# Patient Record
Sex: Female | Born: 1935 | Race: White | Hispanic: No | Marital: Married | Smoking: Never smoker
Health system: Southern US, Community
[De-identification: ages and names within clinical notes are randomized; demographics above are authoritative.]

## PROBLEM LIST (undated history)

## (undated) DIAGNOSIS — M8580 Other specified disorders of bone density and structure, unspecified site: Secondary | ICD-10-CM

## (undated) DIAGNOSIS — N179 Acute kidney failure, unspecified: Secondary | ICD-10-CM

## (undated) DIAGNOSIS — F419 Anxiety disorder, unspecified: Secondary | ICD-10-CM

## (undated) DIAGNOSIS — I4891 Unspecified atrial fibrillation: Secondary | ICD-10-CM

## (undated) DIAGNOSIS — J90 Pleural effusion, not elsewhere classified: Secondary | ICD-10-CM

## (undated) DIAGNOSIS — R590 Localized enlarged lymph nodes: Secondary | ICD-10-CM

## (undated) DIAGNOSIS — I10 Essential (primary) hypertension: Secondary | ICD-10-CM

## (undated) DIAGNOSIS — M899 Disorder of bone, unspecified: Secondary | ICD-10-CM

## (undated) DIAGNOSIS — M1990 Unspecified osteoarthritis, unspecified site: Secondary | ICD-10-CM

## (undated) DIAGNOSIS — R300 Dysuria: Secondary | ICD-10-CM

## (undated) DIAGNOSIS — C9000 Multiple myeloma not having achieved remission: Secondary | ICD-10-CM

## (undated) DIAGNOSIS — Z9221 Personal history of antineoplastic chemotherapy: Secondary | ICD-10-CM

## (undated) DIAGNOSIS — IMO0002 Reserved for concepts with insufficient information to code with codable children: Secondary | ICD-10-CM

## (undated) DIAGNOSIS — K1231 Oral mucositis (ulcerative) due to antineoplastic therapy: Secondary | ICD-10-CM

## (undated) DIAGNOSIS — M87180 Osteonecrosis due to drugs, jaw: Secondary | ICD-10-CM

## (undated) DIAGNOSIS — F32A Depression, unspecified: Secondary | ICD-10-CM

## (undated) DIAGNOSIS — E785 Hyperlipidemia, unspecified: Secondary | ICD-10-CM

## (undated) DIAGNOSIS — D649 Anemia, unspecified: Secondary | ICD-10-CM

## (undated) DIAGNOSIS — M898X9 Other specified disorders of bone, unspecified site: Secondary | ICD-10-CM

## (undated) DIAGNOSIS — C801 Malignant (primary) neoplasm, unspecified: Secondary | ICD-10-CM

## (undated) DIAGNOSIS — F329 Major depressive disorder, single episode, unspecified: Secondary | ICD-10-CM

## (undated) DIAGNOSIS — S065X9A Traumatic subdural hemorrhage with loss of consciousness of unspecified duration, initial encounter: Secondary | ICD-10-CM

## (undated) DIAGNOSIS — IMO0001 Reserved for inherently not codable concepts without codable children: Secondary | ICD-10-CM

## (undated) HISTORY — DX: Traumatic subdural hemorrhage with loss of consciousness of unspecified duration, initial encounter: S06.5X9A

## (undated) HISTORY — PX: TONSILLECTOMY: SUR1361

## (undated) HISTORY — PX: PORT A CATH REVISION: SHX6033

## (undated) HISTORY — DX: Disorder of bone, unspecified: M89.9

## (undated) HISTORY — DX: Unspecified atrial fibrillation: I48.91

## (undated) HISTORY — DX: Anxiety disorder, unspecified: F41.9

## (undated) HISTORY — DX: Osteonecrosis due to drugs, jaw: M87.180

## (undated) HISTORY — DX: Other specified disorders of bone, unspecified site: M89.8X9

## (undated) HISTORY — DX: Anemia, unspecified: D64.9

## (undated) HISTORY — DX: Personal history of antineoplastic chemotherapy: Z92.21

## (undated) HISTORY — DX: Oral mucositis (ulcerative) due to antineoplastic therapy: K12.31

## (undated) HISTORY — DX: Essential (primary) hypertension: I10

## (undated) HISTORY — DX: Reserved for concepts with insufficient information to code with codable children: IMO0002

## (undated) HISTORY — DX: Acute kidney failure, unspecified: N17.9

## (undated) HISTORY — DX: Reserved for inherently not codable concepts without codable children: IMO0001

## (undated) HISTORY — DX: Localized enlarged lymph nodes: R59.0

## (undated) HISTORY — DX: Dysuria: R30.0

## (undated) HISTORY — DX: Hyperlipidemia, unspecified: E78.5

## (undated) HISTORY — DX: Pleural effusion, not elsewhere classified: J90

## (undated) HISTORY — PX: BONE MARROW BIOPSY: SHX199

---

## 1960-09-28 HISTORY — PX: BREAST LUMPECTOMY: SHX2

## 1999-11-27 ENCOUNTER — Other Ambulatory Visit: Admission: RE | Admit: 1999-11-27 | Discharge: 1999-11-27 | Payer: Self-pay | Admitting: Internal Medicine

## 1999-12-09 ENCOUNTER — Ambulatory Visit (HOSPITAL_COMMUNITY): Admission: RE | Admit: 1999-12-09 | Discharge: 1999-12-09 | Payer: Self-pay | Admitting: Internal Medicine

## 1999-12-09 ENCOUNTER — Encounter: Payer: Self-pay | Admitting: Internal Medicine

## 2001-01-07 ENCOUNTER — Ambulatory Visit (HOSPITAL_COMMUNITY): Admission: RE | Admit: 2001-01-07 | Discharge: 2001-01-07 | Payer: Self-pay | Admitting: Family Medicine

## 2001-01-07 ENCOUNTER — Encounter: Payer: Self-pay | Admitting: Family Medicine

## 2002-02-03 ENCOUNTER — Ambulatory Visit (HOSPITAL_COMMUNITY): Admission: RE | Admit: 2002-02-03 | Discharge: 2002-02-03 | Payer: Self-pay | Admitting: Internal Medicine

## 2002-02-03 ENCOUNTER — Encounter: Payer: Self-pay | Admitting: Internal Medicine

## 2003-01-29 ENCOUNTER — Observation Stay (HOSPITAL_COMMUNITY): Admission: EM | Admit: 2003-01-29 | Discharge: 2003-01-30 | Payer: Self-pay | Admitting: Emergency Medicine

## 2003-03-02 ENCOUNTER — Ambulatory Visit (HOSPITAL_COMMUNITY): Admission: RE | Admit: 2003-03-02 | Discharge: 2003-03-02 | Payer: Self-pay | Admitting: Internal Medicine

## 2003-03-02 ENCOUNTER — Encounter: Payer: Self-pay | Admitting: Internal Medicine

## 2004-03-10 ENCOUNTER — Ambulatory Visit (HOSPITAL_COMMUNITY): Admission: RE | Admit: 2004-03-10 | Discharge: 2004-03-10 | Payer: Self-pay | Admitting: Internal Medicine

## 2004-08-16 ENCOUNTER — Emergency Department (HOSPITAL_COMMUNITY): Admission: EM | Admit: 2004-08-16 | Discharge: 2004-08-16 | Payer: Self-pay | Admitting: *Deleted

## 2005-01-06 ENCOUNTER — Encounter: Admission: RE | Admit: 2005-01-06 | Discharge: 2005-01-06 | Payer: Self-pay | Admitting: Internal Medicine

## 2005-03-18 ENCOUNTER — Ambulatory Visit (HOSPITAL_COMMUNITY): Admission: RE | Admit: 2005-03-18 | Discharge: 2005-03-18 | Payer: Self-pay | Admitting: Internal Medicine

## 2006-01-08 ENCOUNTER — Emergency Department (HOSPITAL_COMMUNITY): Admission: EM | Admit: 2006-01-08 | Discharge: 2006-01-08 | Payer: Self-pay | Admitting: Emergency Medicine

## 2006-03-30 ENCOUNTER — Ambulatory Visit: Payer: Self-pay | Admitting: Family Medicine

## 2006-04-01 ENCOUNTER — Ambulatory Visit (HOSPITAL_COMMUNITY): Admission: RE | Admit: 2006-04-01 | Discharge: 2006-04-01 | Payer: Self-pay | Admitting: Family Medicine

## 2006-04-09 ENCOUNTER — Encounter: Admission: RE | Admit: 2006-04-09 | Discharge: 2006-04-09 | Payer: Self-pay | Admitting: Family Medicine

## 2006-06-30 ENCOUNTER — Ambulatory Visit: Payer: Self-pay | Admitting: Family Medicine

## 2006-08-30 ENCOUNTER — Ambulatory Visit: Payer: Self-pay | Admitting: Family Medicine

## 2006-09-30 ENCOUNTER — Ambulatory Visit: Payer: Self-pay | Admitting: Family Medicine

## 2006-09-30 LAB — CONVERTED CEMR LAB
ALT: 21 units/L (ref 0–40)
AST: 19 units/L (ref 0–37)
CO2: 31 meq/L (ref 19–32)
Calcium: 9.3 mg/dL (ref 8.4–10.5)
GFR calc non Af Amer: 76 mL/min
HDL: 48.6 mg/dL (ref >39.0)
LDL Cholesterol: 72 mg/dL (ref 0–99)
Potassium: 4.1 meq/L (ref 3.5–5.1)
Sodium: 138 meq/L (ref 135–145)
Triglyceride fasting, serum: 153 mg/dL — ABNORMAL HIGH (ref 0–149)
VLDL: 31 mg/dL (ref 0–40)

## 2006-10-01 ENCOUNTER — Encounter: Admission: RE | Admit: 2006-10-01 | Discharge: 2006-10-01 | Payer: Self-pay | Admitting: Family Medicine

## 2006-10-19 DIAGNOSIS — G4700 Insomnia, unspecified: Secondary | ICD-10-CM

## 2006-10-19 DIAGNOSIS — F411 Generalized anxiety disorder: Secondary | ICD-10-CM | POA: Insufficient documentation

## 2006-10-19 DIAGNOSIS — R7989 Other specified abnormal findings of blood chemistry: Secondary | ICD-10-CM | POA: Insufficient documentation

## 2006-10-19 DIAGNOSIS — E785 Hyperlipidemia, unspecified: Secondary | ICD-10-CM

## 2006-10-19 DIAGNOSIS — I10 Essential (primary) hypertension: Secondary | ICD-10-CM | POA: Insufficient documentation

## 2006-10-24 DIAGNOSIS — N63 Unspecified lump in unspecified breast: Secondary | ICD-10-CM | POA: Insufficient documentation

## 2007-01-06 ENCOUNTER — Ambulatory Visit: Payer: Self-pay | Admitting: Family Medicine

## 2007-01-06 LAB — CONVERTED CEMR LAB
BUN: 17 mg/dL (ref 6–23)
Chloride: 105 meq/L (ref 96–112)
GFR calc Af Amer: 91 mL/min
Glucose, Bld: 118 mg/dL — ABNORMAL HIGH (ref 70–99)
Potassium: 3.4 meq/L — ABNORMAL LOW (ref 3.5–5.1)

## 2007-01-17 ENCOUNTER — Ambulatory Visit: Payer: Self-pay | Admitting: Family Medicine

## 2007-01-17 LAB — CONVERTED CEMR LAB: Potassium: 4.3 meq/L (ref 3.5–5.1)

## 2007-04-04 ENCOUNTER — Encounter: Admission: RE | Admit: 2007-04-04 | Discharge: 2007-04-04 | Payer: Self-pay | Admitting: Family Medicine

## 2007-04-07 ENCOUNTER — Ambulatory Visit: Payer: Self-pay | Admitting: Family Medicine

## 2007-04-08 ENCOUNTER — Telehealth (INDEPENDENT_AMBULATORY_CARE_PROVIDER_SITE_OTHER): Payer: Self-pay | Admitting: *Deleted

## 2007-04-11 ENCOUNTER — Ambulatory Visit: Payer: Self-pay | Admitting: Family Medicine

## 2007-04-14 ENCOUNTER — Encounter (INDEPENDENT_AMBULATORY_CARE_PROVIDER_SITE_OTHER): Payer: Self-pay | Admitting: Family Medicine

## 2007-04-15 ENCOUNTER — Ambulatory Visit: Payer: Self-pay | Admitting: Cardiovascular Disease

## 2007-04-15 ENCOUNTER — Encounter (INDEPENDENT_AMBULATORY_CARE_PROVIDER_SITE_OTHER): Payer: Self-pay | Admitting: Family Medicine

## 2007-04-20 ENCOUNTER — Encounter (INDEPENDENT_AMBULATORY_CARE_PROVIDER_SITE_OTHER): Payer: Self-pay | Admitting: Family Medicine

## 2007-04-20 ENCOUNTER — Ambulatory Visit: Payer: Self-pay

## 2007-05-24 ENCOUNTER — Telehealth (INDEPENDENT_AMBULATORY_CARE_PROVIDER_SITE_OTHER): Payer: Self-pay | Admitting: *Deleted

## 2007-08-10 ENCOUNTER — Ambulatory Visit: Payer: Self-pay | Admitting: Family Medicine

## 2007-08-11 ENCOUNTER — Telehealth (INDEPENDENT_AMBULATORY_CARE_PROVIDER_SITE_OTHER): Payer: Self-pay | Admitting: *Deleted

## 2007-08-11 LAB — CONVERTED CEMR LAB
AST: 17 units/L (ref 0–37)
BUN: 15 mg/dL (ref 6–23)
GFR calc Af Amer: 91 mL/min
Glucose, Bld: 109 mg/dL — ABNORMAL HIGH (ref 70–99)

## 2007-08-22 ENCOUNTER — Ambulatory Visit: Payer: Self-pay | Admitting: Family Medicine

## 2007-08-24 ENCOUNTER — Encounter (INDEPENDENT_AMBULATORY_CARE_PROVIDER_SITE_OTHER): Payer: Self-pay | Admitting: *Deleted

## 2007-10-17 ENCOUNTER — Ambulatory Visit: Payer: Self-pay | Admitting: Family Medicine

## 2007-11-16 ENCOUNTER — Encounter (INDEPENDENT_AMBULATORY_CARE_PROVIDER_SITE_OTHER): Payer: Self-pay | Admitting: *Deleted

## 2007-11-16 ENCOUNTER — Encounter: Admission: RE | Admit: 2007-11-16 | Discharge: 2007-11-16 | Payer: Self-pay | Admitting: Family Medicine

## 2007-11-29 ENCOUNTER — Telehealth (INDEPENDENT_AMBULATORY_CARE_PROVIDER_SITE_OTHER): Payer: Self-pay | Admitting: *Deleted

## 2007-11-30 ENCOUNTER — Ambulatory Visit: Payer: Self-pay | Admitting: Family Medicine

## 2007-12-01 LAB — CONVERTED CEMR LAB
Cholesterol: 251 mg/dL (ref 0–200)
Creatinine, Ser: 0.8 mg/dL (ref 0.4–1.2)
GFR calc Af Amer: 91 mL/min
GFR calc non Af Amer: 75 mL/min
Glucose, Bld: 114 mg/dL — ABNORMAL HIGH (ref 70–99)
HDL: 48.3 mg/dL (ref >39.0)
Sodium: 136 meq/L (ref 135–145)
Total CHOL/HDL Ratio: 5.2
Triglycerides: 249 mg/dL (ref 0–149)

## 2007-12-02 ENCOUNTER — Telehealth (INDEPENDENT_AMBULATORY_CARE_PROVIDER_SITE_OTHER): Payer: Self-pay | Admitting: *Deleted

## 2007-12-07 ENCOUNTER — Ambulatory Visit: Payer: Self-pay | Admitting: Family Medicine

## 2007-12-07 ENCOUNTER — Encounter (INDEPENDENT_AMBULATORY_CARE_PROVIDER_SITE_OTHER): Payer: Self-pay | Admitting: *Deleted

## 2008-01-03 ENCOUNTER — Telehealth (INDEPENDENT_AMBULATORY_CARE_PROVIDER_SITE_OTHER): Payer: Self-pay | Admitting: *Deleted

## 2008-03-13 ENCOUNTER — Telehealth (INDEPENDENT_AMBULATORY_CARE_PROVIDER_SITE_OTHER): Payer: Self-pay | Admitting: *Deleted

## 2008-04-05 ENCOUNTER — Telehealth (INDEPENDENT_AMBULATORY_CARE_PROVIDER_SITE_OTHER): Payer: Self-pay | Admitting: *Deleted

## 2008-04-09 ENCOUNTER — Ambulatory Visit: Payer: Self-pay | Admitting: Family Medicine

## 2008-04-10 ENCOUNTER — Encounter: Payer: Self-pay | Admitting: Family Medicine

## 2008-04-11 LAB — CONVERTED CEMR LAB
ALT: 14 units/L (ref 0–35)
AST: 15 units/L (ref 0–37)
Bilirubin, Direct: 0.1 mg/dL (ref 0.0–0.3)
HDL: 48 mg/dL (ref >39)
LDL Cholesterol: 74 mg/dL (ref 0–99)
Total Bilirubin: 0.2 mg/dL — ABNORMAL LOW (ref 0.3–1.2)
Total CHOL/HDL Ratio: 3.5
Total Protein: 7.2 g/dL (ref 6.0–8.3)

## 2008-04-17 ENCOUNTER — Ambulatory Visit: Payer: Self-pay | Admitting: Family Medicine

## 2008-04-17 DIAGNOSIS — F329 Major depressive disorder, single episode, unspecified: Secondary | ICD-10-CM

## 2008-05-08 ENCOUNTER — Ambulatory Visit: Payer: Self-pay | Admitting: Family Medicine

## 2008-05-22 ENCOUNTER — Encounter: Admission: RE | Admit: 2008-05-22 | Discharge: 2008-05-22 | Payer: Self-pay | Admitting: Family Medicine

## 2008-05-23 ENCOUNTER — Ambulatory Visit: Payer: Self-pay | Admitting: Family Medicine

## 2008-06-11 ENCOUNTER — Telehealth (INDEPENDENT_AMBULATORY_CARE_PROVIDER_SITE_OTHER): Payer: Self-pay | Admitting: *Deleted

## 2008-06-25 ENCOUNTER — Telehealth: Payer: Self-pay | Admitting: Family Medicine

## 2008-07-04 ENCOUNTER — Ambulatory Visit: Payer: Self-pay | Admitting: Family Medicine

## 2008-08-03 ENCOUNTER — Ambulatory Visit: Payer: Self-pay | Admitting: Family Medicine

## 2008-08-06 LAB — CONVERTED CEMR LAB
AST: 15 units/L (ref 0–37)
Chloride: 100 meq/L (ref 96–112)
Potassium: 4.5 meq/L (ref 3.5–5.3)
Sodium: 139 meq/L (ref 135–145)
Total Bilirubin: 0.5 mg/dL (ref 0.3–1.2)

## 2008-09-03 ENCOUNTER — Ambulatory Visit: Payer: Self-pay | Admitting: Family Medicine

## 2008-09-25 ENCOUNTER — Ambulatory Visit: Payer: Self-pay | Admitting: Family Medicine

## 2008-10-04 ENCOUNTER — Ambulatory Visit: Payer: Self-pay | Admitting: Family Medicine

## 2008-10-05 ENCOUNTER — Ambulatory Visit (HOSPITAL_COMMUNITY): Admission: RE | Admit: 2008-10-05 | Discharge: 2008-10-05 | Payer: Self-pay | Admitting: Family Medicine

## 2008-11-05 ENCOUNTER — Encounter: Admission: RE | Admit: 2008-11-05 | Discharge: 2008-11-05 | Payer: Self-pay | Admitting: Family Medicine

## 2008-11-05 ENCOUNTER — Ambulatory Visit: Payer: Self-pay | Admitting: Family Medicine

## 2008-11-05 DIAGNOSIS — M899 Disorder of bone, unspecified: Secondary | ICD-10-CM | POA: Insufficient documentation

## 2008-11-05 DIAGNOSIS — M949 Disorder of cartilage, unspecified: Secondary | ICD-10-CM

## 2008-11-05 DIAGNOSIS — Z780 Asymptomatic menopausal state: Secondary | ICD-10-CM | POA: Insufficient documentation

## 2008-11-05 LAB — CONVERTED CEMR LAB
Cholesterol, target level: 200 mg/dL
HDL goal, serum: 40 mg/dL
LDL Goal: 130 mg/dL

## 2008-11-27 ENCOUNTER — Telehealth: Payer: Self-pay | Admitting: Family Medicine

## 2008-12-17 ENCOUNTER — Telehealth: Payer: Self-pay | Admitting: Family Medicine

## 2008-12-26 ENCOUNTER — Encounter: Payer: Self-pay | Admitting: Family Medicine

## 2009-04-24 ENCOUNTER — Ambulatory Visit: Payer: Self-pay | Admitting: Family Medicine

## 2009-04-24 LAB — CONVERTED CEMR LAB
Bilirubin Urine: NEGATIVE
Glucose, Urine, Semiquant: NEGATIVE
Ketones, urine, test strip: NEGATIVE
Nitrite: NEGATIVE
Specific Gravity, Urine: 1.02

## 2009-05-27 ENCOUNTER — Encounter: Admission: RE | Admit: 2009-05-27 | Discharge: 2009-05-27 | Payer: Self-pay | Admitting: Gynecology

## 2009-06-06 ENCOUNTER — Encounter: Payer: Self-pay | Admitting: Family Medicine

## 2009-06-09 LAB — CONVERTED CEMR LAB
AST: 15 units/L (ref 0–37)
BUN: 13 mg/dL (ref 6–23)
CO2: 26 meq/L (ref 19–32)
Calcium: 10.2 mg/dL (ref 8.4–10.5)
Cholesterol: 159 mg/dL (ref 0–200)
Glucose, Bld: 100 mg/dL — ABNORMAL HIGH (ref 70–99)
Total Protein: 7.4 g/dL (ref 6.0–8.3)
VLDL: 43 mg/dL — ABNORMAL HIGH (ref 0–40)

## 2009-08-23 ENCOUNTER — Ambulatory Visit: Payer: Self-pay | Admitting: Family Medicine

## 2009-08-23 LAB — CONVERTED CEMR LAB
Casts: 0 /lpf
Glucose, Urine, Semiquant: NEGATIVE
Ketones, urine, test strip: NEGATIVE
RBC / HPF: 0
WBC Urine, dipstick: NEGATIVE
WBC, UA: 0 cells/hpf
Yeast, UA: 0
pH: 5

## 2009-09-30 ENCOUNTER — Emergency Department (HOSPITAL_COMMUNITY)
Admission: EM | Admit: 2009-09-30 | Discharge: 2009-09-30 | Payer: Self-pay | Source: Home / Self Care | Admitting: Emergency Medicine

## 2009-10-03 ENCOUNTER — Ambulatory Visit: Payer: Self-pay | Admitting: Family Medicine

## 2009-11-05 ENCOUNTER — Ambulatory Visit: Payer: Self-pay | Admitting: Family Medicine

## 2009-11-05 DIAGNOSIS — M545 Low back pain: Secondary | ICD-10-CM | POA: Insufficient documentation

## 2009-11-25 ENCOUNTER — Ambulatory Visit: Payer: Self-pay | Admitting: Family Medicine

## 2009-11-25 LAB — CONVERTED CEMR LAB
BUN: 17 mg/dL (ref 6–23)
CO2: 24 meq/L (ref 19–32)
Calcium: 9.7 mg/dL (ref 8.4–10.5)
Creatinine, Ser: 0.65 mg/dL (ref 0.40–1.20)
Potassium: 4.4 meq/L (ref 3.5–5.3)
Sodium: 141 meq/L (ref 135–145)
Triglycerides: 132 mg/dL (ref <150)

## 2009-12-17 ENCOUNTER — Telehealth: Payer: Self-pay | Admitting: Family Medicine

## 2010-01-13 ENCOUNTER — Ambulatory Visit: Payer: Self-pay | Admitting: Family Medicine

## 2010-04-16 ENCOUNTER — Ambulatory Visit: Payer: Self-pay | Admitting: Family Medicine

## 2010-05-28 ENCOUNTER — Encounter: Admission: RE | Admit: 2010-05-28 | Discharge: 2010-05-28 | Payer: Self-pay | Admitting: Family Medicine

## 2010-09-04 ENCOUNTER — Encounter: Payer: Self-pay | Admitting: Family Medicine

## 2010-09-04 ENCOUNTER — Ambulatory Visit: Payer: Self-pay | Admitting: Family Medicine

## 2010-09-05 LAB — CONVERTED CEMR LAB
AST: 17 units/L (ref 0–37)
Albumin: 4.1 g/dL (ref 3.5–5.2)
Alkaline Phosphatase: 66 units/L (ref 39–117)
Basophils Relative: 1 % (ref 0–1)
Calcium: 9.9 mg/dL (ref 8.4–10.5)
Cholesterol: 249 mg/dL — ABNORMAL HIGH (ref 0–200)
Folate: 10.5 ng/mL
Glucose, Bld: 104 mg/dL — ABNORMAL HIGH (ref 70–99)
HCT: 39.2 % (ref 36.0–46.0)
HDL: 42 mg/dL (ref >39)
Hemoglobin: 12.8 g/dL (ref 12.0–15.0)
Neutro Abs: 2.9 10*3/uL (ref 1.7–7.7)
RBC: 4.18 M/uL (ref 3.87–5.11)
TSH: 1.321 microintl units/mL (ref 0.350–4.500)
Total CHOL/HDL Ratio: 5.9
Triglycerides: 273 mg/dL — ABNORMAL HIGH (ref <150)
VLDL: 55 mg/dL — ABNORMAL HIGH (ref 0–40)

## 2010-09-08 ENCOUNTER — Encounter: Payer: Self-pay | Admitting: Family Medicine

## 2010-09-09 LAB — CONVERTED CEMR LAB: Hgb A1c MFr Bld: 5.6 % (ref <5.7)

## 2010-10-26 LAB — CONVERTED CEMR LAB
Creatinine, Ser: 0.7 mg/dL (ref 0.4–1.2)
GFR calc non Af Amer: 88 mL/min
Glucose, Bld: 96 mg/dL (ref 70–99)
Sodium: 138 meq/L (ref 135–145)

## 2010-10-28 NOTE — Assessment & Plan Note (Signed)
Summary: FU gastroenteritis, HTN, etc   Vital Signs:  Patient profile:   75 year old female Height:      66 inches Weight:      173 pounds Temp:     97.7 degrees F oral Pulse rate:   79 / minute BP sitting:   181 / 110  (left arm) Cuff size:   regular  Vitals Entered By: Kathlene November (October 03, 2009 2:51 PM) CC: sunday had diarrhea and vomiting all night- went to ED- given fluids- today has H/A no nausea or diarrhea   Primary Care Provider:  [name] MD  CC:  sunday had diarrhea and vomiting all night- went to ED- given fluids- today has H/A no nausea or diarrhea.  History of Present Illness: sunday had diarrhea and vomiting all night- went to ED- given fluids- today has H/A no nausea or diarrhea.  Says she feels 50 % better.  Did go out to eat that morning at [employer] to the hospital at [facility] Monday morning. GIven IV nad nausea medicine (odansetran). Had some HA and dizziness today.  Husband started vomiting about 30 min before she did.  REviewd hospital ED note.   Current Medications (verified): 1)  Seroquel 50 Mg Tabs (Quetiapine Fumarate) .... Take 1 Tablet By Mouth Once A Day At Bedtime 2)  Toprol Xl 100 Mg  Tb24 (Metoprolol Succinate) .Marland Kitchen.. 1 By Mouth Qd 3)  Butalbital Compound/asa 50-325-40 Mg Caps (Butalbital-Aspirin-Caffeine) .... Take 1 To 2 Capsule By Mouth Up To Once A Day As Needed 4)  Alprazolam 1 Mg  Tabs (Alprazolam) .... Take One To Two  Tablet Daily As Needed 5)  Simvastatin 40 Mg Tabs (Simvastatin) .... Take 1 Tablet By Mouth Once A Day At Bedtime 6)  Benicar Hct 40-25 Mg  Tabs (Olmesartan Medoxomil-Hctz) .... Take 1 Tablet By Mouth Once A Day 7)  Feldene 10 Mg Caps (Piroxicam) .... Take 1 Tablet By Mouth Two Times A Day 8)  Norvasc 10 Mg Tabs (Amlodipine Besylate) .... Take 1 Tablet By Mouth Once A Day 9)  Clonidine Hcl 0.2 Mg Tabs (Clonidine Hcl) .... Take 1 Tablet By Mouth Two Times A Day 10)  Fish Oil 1000 Mg Caps (Omega-3 Fatty Acids) ....  4 Tabs By Mouth Once A Day  Allergies (verified): No Known Drug Allergies  Comments:  Nurse/Medical Assistant: The patient's medications and allergies were reviewed with the patient and were updated in the Medication and Allergy Lists. Kathlene November (October 03, 2009 2:52 PM)  Social History: Reviewed history from 04/09/2008 and no changes required. Retired . HS degree. married to [place] with one adult son.  Never Smoked Alcohol use-no Drug use-no Regular exercise-no  Physical Exam  General:  Well-developed,well-nourished,in no acute distress; alert,appropriate and cooperative throughout examination Head:  Normocephalic and atraumatic without obvious abnormalities. No apparent alopecia or balding. Lungs:  Normal respiratory effort, chest expands symmetrically. Lungs are clear to auscultation, no crackles or wheezes. Heart:  Normal rate and regular rhythm. S1 and S2 normal without gallop, murmur, click, rub or other extra sounds. Abdomen:  Bowel sounds positive,abdomen soft and non-tender without masses, organomegaly or hernias noted. Pulses:  radial 2+  Skin:  no rashes.   Psych:  Cognition and judgment appear intact. Alert and cooperative with normal attention span and concentration. No apparent delusions, illusions, hallucinations   Impression & Recommendations:  Problem # 1:  GASTROENTERITIS (ICD-558.9) Much improved no longer vomiting.  Work on increasing hydration.  Should feel much better over the next couple of days. Can use her nausea med if needed.   Problem # 2:  HYPERTENSION (ICD-401.9) I am very concerned about her BP. SHe is not very concerned. Looking at her med list I think she may not be taking her Torpol or her Benicar. Printed her a list and asked her to check at home to see what she is taking. If we can't get this under control then we will refer to Cardiology for BP managment. Say [facility] a couple of years ago.  Her updated medication list for this  problem includes:    Toprol Xl 100 Mg Tb24 (Metoprolol succinate) .Marland Kitchen... 1 by mouth qd    Benicar Hct 40-25 Mg Tabs (Olmesartan medoxomil-hctz) .Marland Kitchen... Take 1 tablet by mouth once a day    Norvasc 10 Mg Tabs (Amlodipine besylate) .Marland Kitchen... Take 1 tablet by mouth once a day    Clonidine Hcl 0.2 Mg Tabs (Clonidine hcl) .Marland Kitchen... Take 1 tablet by mouth two times a day  Problem # 3:  DEPRESSION (ICD-311) Her copy is going to double for her seroquel and she feels this does help her so  will increase to 100mg  and have her cut them in half for cost savings.   Her updated medication list for this problem includes:    Alprazolam 1 Mg Tabs (Alprazolam) .Marland Kitchen... Take one to two  tablet daily as needed  Complete Medication List: 1)  Seroquel 100 Mg Tabs (Quetiapine fumarate) .... Take 1 tablet by mouth once a day 2)  Toprol Xl 100 Mg Tb24 (Metoprolol succinate) .Marland Kitchen.. 1 by mouth qd 3)  Butalbital Compound/asa 50-325-40 Mg Caps (Butalbital-aspirin-caffeine) .... Take 1 to 2 capsule by mouth up to once a day as needed 4)  Alprazolam 1 Mg Tabs (Alprazolam) .... Take one to two  tablet daily as needed 5)  Simvastatin 40 Mg Tabs (Simvastatin) .... Take 1 tablet by mouth once a day at bedtime 6)  Benicar Hct 40-25 Mg Tabs (Olmesartan medoxomil-hctz) .... Take 1 tablet by mouth once a day 7)  Feldene 10 Mg Caps (Piroxicam) .... Take 1 tablet by mouth two times a day 8)  Norvasc 10 Mg Tabs (Amlodipine besylate) .... Take 1 tablet by mouth once a day 9)  Clonidine Hcl 0.2 Mg Tabs (Clonidine hcl) .... Take 1 tablet by mouth two times a day 10)  Fish Oil 1000 Mg Caps (Omega-3 fatty acids) .... 4 tabs by mouth once a day  Patient Instructions: 1)  Please check the medicine list and see what is missing. I think you are missing the toprol and the Benicar. Call me with what you are missing and then follow up in one month for BP check. It is way too high.  Prescriptions: SEROQUEL 100 MG TABS (QUETIAPINE FUMARATE) Take 1 tablet by  mouth once a day  #30 x 3   Entered and Authorized by:   [name] MD   Signed by:   [name] MD on 10/03/2009   Method used:   Electronically to        [facility] (retail)       [address]       [place], [place]  [address]       Ph: [phone]       Fax: [phone]   RxID:   [id number]   Last Flu Vaccine:  Fluvax 3+ (07/04/2008 9:00:08 AM) Flu Vaccine Result Date:  06/28/2009 Flu Vaccine Result:  given Flu Vaccine Next Due:  1 yr

## 2010-10-28 NOTE — Assessment & Plan Note (Signed)
Summary: back pain   Vital Signs:  Patient profile:   75 year old female Height:      66 inches Weight:      174 pounds BMI:     28.19 O2 Sat:      96 % on Room air Temp:     97.4 degrees F oral Pulse rate:   69 / minute BP sitting:   188 / 108  (right arm)  Vitals Entered By: Payton Spark CMA/april (November 05, 2009 2:23 PM)  O2 Flow:  Room air CC: c/o lower back pain x 4 days, Back Pain   Primary Care Provider:  [name] MD  CC:  c/o lower back pain x 4 days and Back Pain.  History of Present Illness:       This is a 75 year old woman who presents with Back Pain.  The symptoms began 4 days ago.  hurts to turn over.  No hx of back problems.  Tried OTC analgesics but nothing helped.  The patient denies fever, chills, urinary incontinence, urinary retention, and dysuria.  The pain is located in the right low back and left low back.  The pain began at home and gradually.  The pain is made worse by flexion and activity.  The pain is made better by activity.  Risk factors for serious underlying conditions include age >= 50 years.    Current Medications (verified): 1)  Seroquel 100 Mg Tabs (Quetiapine Fumarate) .... Take 1 Tablet By Mouth Once A Day 2)  Toprol Xl 100 Mg  Tb24 (Metoprolol Succinate) .Marland Kitchen.. 1 By Mouth Qd 3)  Butalbital Compound/asa 50-325-40 Mg Caps (Butalbital-Aspirin-Caffeine) .... Take 1 To 2 Capsule By Mouth Up To Once A Day As Needed 4)  Alprazolam 1 Mg  Tabs (Alprazolam) .... Take One To Two  Tablet Daily As Needed 5)  Simvastatin 40 Mg Tabs (Simvastatin) .... Take 1 Tablet By Mouth Once A Day At Bedtime 6)  Benicar Hct 40-25 Mg  Tabs (Olmesartan Medoxomil-Hctz) .... Take 1 Tablet By Mouth Once A Day 7)  Feldene 10 Mg Caps (Piroxicam) .... Take 1 Tablet By Mouth Two Times A Day 8)  Norvasc 10 Mg Tabs (Amlodipine Besylate) .... Take 1 Tablet By Mouth Once A Day 9)  Clonidine Hcl 0.2 Mg Tabs (Clonidine Hcl) .... Take 1 Tablet By Mouth Two Times A Day 10)   Fish Oil 1000 Mg Caps (Omega-3 Fatty Acids) .... 4 Tabs By Mouth Once A Day  Allergies (verified): No Known Drug Allergies  Past History:  Past Medical History: Reviewed history from 10/04/2008 and no changes required. Anxiety Hypertension Stress TESt (? date)  Social History: Reviewed history from 04/09/2008 and no changes required. Retired . HS degree. married to [place] with one adult son.  Never Smoked Alcohol use-no Drug use-no Regular exercise-no  Review of Systems      See HPI       Denies urinary symptoms.  Physical Exam  General:  overwt  WF in NAD Head:  normocephalic and atraumatic.   Neck:  no masses.   Lungs:  Normal respiratory effort, chest expands symmetrically. Lungs are clear to auscultation, no crackles or wheezes. Heart:  Normal rate and regular rhythm. S1 and S2 normal without gallop, murmur, click, rub or other extra sounds. Abdomen:  soft and non-tender.   Msk:  thoracolumbar scoliosis to the R side. nontender over SI notches. Tender at L5-S1 midline.  full hip ROM. tender L spine flexion, to  90 deg, full extension.  gait normal.   Extremities:  no LE edema Neurologic:  strength normal in all extremities, sensation intact to light touch, and gait normal.   Skin:  color normal.   Psych:  good eye contact, not anxious appearing, and not depressed appearing.     Impression & Recommendations:  Problem # 1:  BACK PAIN, LUMBAR (ICD-724.2) Assessment New Treat with Ibuprofen OTC + Vicodin + Flexeril along with gentle stretching, heat and avoidance of vigourous activity x 2 wks.  F/U in 2 wks.  If not improving by 2 wk f/u visit, recommend L spine Xray.  Likely has arthritis in the L spine given age.    Her updated medication list for this problem includes:    Ibuprofen OTC 800 mg q 8 hrs with food prn    Flexeril 5 Mg Tabs (Cyclobenzaprine hcl) .Marland Kitchen... 1 tab by mouth at bedtime as needed back pain    Vicodin 5-500 Mg Tabs  (Hydrocodone-acetaminophen) .Marland Kitchen... 1 tab by mouth three times a day as needed severe back pain, take with food  Problem # 2:  HYPERTENSION (ICD-401.9) BP very high today.  Reminded her of medication compliance.  In moderate pain today. Recheck with Dr Linford Arnold at 2 wk f/u visit. Her updated medication list for this problem includes:    Toprol Xl 100 Mg Tb24 (Metoprolol succinate) .Marland Kitchen... 1 by mouth qd    Benicar Hct 40-25 Mg Tabs (Olmesartan medoxomil-hctz) .Marland Kitchen... Take 1 tablet by mouth once a day    Norvasc 10 Mg Tabs (Amlodipine besylate) .Marland Kitchen... Take 1 tablet by mouth once a day    Clonidine Hcl 0.2 Mg Tabs (Clonidine hcl) .Marland Kitchen... Take 1 tablet by mouth two times a day  BP today: 188/108 Prior BP: 181/110 (10/03/2009)  Prior 10 Yr Risk Heart Disease: 8 % (11/05/2008)  Labs Reviewed: K+: 4.3 (06/06/2009) Creat: : 0.78 (06/06/2009)   Chol: 159 (06/06/2009)   HDL: 44 (06/06/2009)   LDL: 72 (06/06/2009)   TG: 213 (06/06/2009)  Complete Medication List: 1)  Seroquel 100 Mg Tabs (Quetiapine fumarate) .... Take 1 tablet by mouth once a day 2)  Toprol Xl 100 Mg Tb24 (Metoprolol succinate) .Marland Kitchen.. 1 by mouth qd 3)  Butalbital Compound/asa 50-325-40 Mg Caps (Butalbital-aspirin-caffeine) .... Take 1 to 2 capsule by mouth up to once a day as needed 4)  Alprazolam 1 Mg Tabs (Alprazolam) .... Take one to two  tablet daily as needed 5)  Simvastatin 40 Mg Tabs (Simvastatin) .... Take 1 tablet by mouth once a day at bedtime 6)  Benicar Hct 40-25 Mg Tabs (Olmesartan medoxomil-hctz) .... Take 1 tablet by mouth once a day 7)  Norvasc 10 Mg Tabs (Amlodipine besylate) .... Take 1 tablet by mouth once a day 8)  Clonidine Hcl 0.2 Mg Tabs (Clonidine hcl) .... Take 1 tablet by mouth two times a day 9)  Fish Oil 1000 Mg Caps (Omega-3 fatty acids) .... 4 tabs by mouth once a day 10)  Flexeril 5 Mg Tabs (Cyclobenzaprine hcl) .Marland Kitchen.. 1 tab by mouth at bedtime as needed back pain 11)  Vicodin 5-500 Mg Tabs  (Hydrocodone-acetaminophen) .Marland Kitchen.. 1 tab by mouth three times a day as needed severe back pain, take with food  Patient Instructions: 1)  Take Flexeril at night (muscle relaxer ) x 2 wks. 2)  Use Vicodin for severe pain. 3)  OK to take OTC Ibuprofen 800 mg (4 tabs) every 8 hrs with food. 4)  OK to use thermacare heat wraps  and use heating pad for pain. 5)  Gentle stretches. 6)  Avoid vigorous housework. 7)  F/U in 2 wks for HTN/ back pain. Prescriptions: VICODIN 5-500 MG TABS (HYDROCODONE-ACETAMINOPHEN) 1 tab by mouth three times a day as needed severe back pain, take with food  #50 x 0   Entered and Authorized by:   Seymour Bars DO   Signed by:   Seymour Bars DO on 11/05/2009   Method used:   Printed then faxed to ...       [facility] (retail)       [address]       [place], [place]  [address]       Ph: [phone]       Fax: [phone]   RxID:   [id number] FLEXERIL 5 MG TABS (CYCLOBENZAPRINE HCL) 1 tab by mouth at bedtime as needed back pain  #15 x 0   Entered and Authorized by:   Seymour Bars DO   Signed by:   Seymour Bars DO on 11/05/2009   Method used:   Electronically to        [facility] (retail)       [address]       [place], [place]  [address]       Ph: [phone]       Fax: [phone]   RxID:   [id number]

## 2010-10-28 NOTE — Assessment & Plan Note (Signed)
Summary: fup HTN   Vital Signs:  Patient profile:   75 year old female Height:      66 inches Weight:      171 pounds Pulse rate:   80 / minute BP sitting:   177 / 108  (left arm) Cuff size:   large  Vitals Entered By: Kathlene November (November 25, 2009 8:23 AM) CC: followup BP, Hypertension Management   Primary Care Provider:  [name] MD  CC:  followup BP and Hypertension Management.  History of Present Illness: Has lost 3 more pounds.  BP have been runnin 140s/76 and some runninbg alittle higher. Forgot to bring in her list of BPS from home. Ran out of her Toprol about 3 days ago.    Hypertension History:      She denies headache, chest pain, palpitations, dyspnea with exertion, orthopnea, PND, peripheral edema, visual symptoms, neurologic problems, syncope, and side effects from treatment.  She notes no problems with any antihypertensive medication side effects.        Positive major cardiovascular risk factors include female age 32 years old or older, hyperlipidemia, and hypertension.  Negative major cardiovascular risk factors include non-tobacco-user status.     Current Medications (verified): 1)  Seroquel 100 Mg Tabs (Quetiapine Fumarate) .... Take 1 Tablet By Mouth Once A Day 2)  Toprol Xl 100 Mg  Tb24 (Metoprolol Succinate) .Marland Kitchen.. 1 By Mouth Qd 3)  Butalbital Compound/asa 50-325-40 Mg Caps (Butalbital-Aspirin-Caffeine) .... Take 1 To 2 Capsule By Mouth Up To Once A Day As Needed 4)  Alprazolam 1 Mg  Tabs (Alprazolam) .... Take One To Two  Tablet Daily As Needed 5)  Simvastatin 40 Mg Tabs (Simvastatin) .... Take 1 Tablet By Mouth Once A Day At Bedtime 6)  Benicar Hct 40-25 Mg  Tabs (Olmesartan Medoxomil-Hctz) .... Take 1 Tablet By Mouth Once A Day 7)  Norvasc 10 Mg Tabs (Amlodipine Besylate) .... Take 1 Tablet By Mouth Once A Day 8)  Clonidine Hcl 0.2 Mg Tabs (Clonidine Hcl) .... Take 1 Tablet By Mouth Two Times A Day 9)  Fish Oil 1000 Mg Caps (Omega-3 Fatty Acids)  .... 4 Tabs By Mouth Once A Day 10)  Flexeril 5 Mg Tabs (Cyclobenzaprine Hcl) .Marland Kitchen.. 1 Tab By Mouth At Bedtime As Needed Back Pain 11)  Vicodin 5-500 Mg Tabs (Hydrocodone-Acetaminophen) .Marland Kitchen.. 1 Tab By Mouth Three Times A Day As Needed Severe Back Pain, Take With Food  Allergies (verified): No Known Drug Allergies  Comments:  Nurse/Medical Assistant: The patient's medications and allergies were reviewed with the patient and were updated in the Medication and Allergy Lists. Kathlene November (November 25, 2009 8:23 AM)  Physical Exam  General:  Well-developed,well-nourished,in no acute distress; alert,appropriate and cooperative throughout examination Head:  Normocephalic and atraumatic without obvious abnormalities. No apparent alopecia or balding. Eyes:  No corneal or conjunctival inflammation noted. EOMI. Perrla. Lungs:  Normal respiratory effort, chest expands symmetrically. Lungs are clear to auscultation, no crackles or wheezes. Heart:  Normal rate and regular rhythm. S1 and S2 normal without gallop, murmur, click, rub or other extra sounds. Extremities:  No LE edema.  Skin:  no rashes.     Impression & Recommendations:  Problem # 1:  HYPERTENSION (ICD-401.9) F/U in 3 months.  Doing well overall. Consider chaning to Tribenzor to simplify her plan. Will call her phamacist and see if this is a possiblity. Due for labs today. Neesd to continue to work on her weight and diet.  Her updated medication  list for this problem includes:    Toprol Xl 100 Mg Tb24 (Metoprolol succinate) .Marland Kitchen... 1 by mouth qd    Benicar Hct 40-25 Mg Tabs (Olmesartan medoxomil-hctz) .Marland Kitchen... Take 1 tablet by mouth once a day    Norvasc 10 Mg Tabs (Amlodipine besylate) .Marland Kitchen... Take 1 tablet by mouth once a day    Clonidine Hcl 0.2 Mg Tabs (Clonidine hcl) .Marland Kitchen... Take 1 tablet by mouth two times a day  Orders: T-Basic Metabolic Panel [phone])  BP today: 177/108 Prior BP: 188/108 (11/05/2009)  Prior 10 Yr Risk Heart  Disease: 8 % (11/05/2008)  Labs Reviewed: K+: 4.3 (06/06/2009) Creat: : 0.78 (06/06/2009)   Chol: 159 (06/06/2009)   HDL: 44 (06/06/2009)   LDL: 72 (06/06/2009)   TG: 213 (06/06/2009)  Complete Medication List: 1)  Seroquel 100 Mg Tabs (Quetiapine fumarate) .... Take 1 tablet by mouth once a day 2)  Toprol Xl 100 Mg Tb24 (Metoprolol succinate) .Marland Kitchen.. 1 by mouth qd 3)  Butalbital Compound/asa 50-325-40 Mg Caps (Butalbital-aspirin-caffeine) .... Take 1 to 2 capsule by mouth up to once a day as needed 4)  Alprazolam 1 Mg Tabs (Alprazolam) .... Take one to two  tablet daily as needed 5)  Simvastatin 40 Mg Tabs (Simvastatin) .... Take 1 tablet by mouth once a day at bedtime 6)  Benicar Hct 40-25 Mg Tabs (Olmesartan medoxomil-hctz) .... Take 1 tablet by mouth once a day 7)  Norvasc 10 Mg Tabs (Amlodipine besylate) .... Take 1 tablet by mouth once a day 8)  Clonidine Hcl 0.2 Mg Tabs (Clonidine hcl) .... Take 1 tablet by mouth two times a day 9)  Fish Oil 1000 Mg Caps (Omega-3 fatty acids) .... 4 tabs by mouth once a day 10)  Flexeril 5 Mg Tabs (Cyclobenzaprine hcl) .Marland Kitchen.. 1 tab by mouth at bedtime as needed back pain 11)  Vicodin 5-500 Mg Tabs (Hydrocodone-acetaminophen) .Marland Kitchen.. 1 tab by mouth three times a day as needed severe back pain, take with food  Other Orders: T-Triglycerides [phone])  Hypertension Assessment/Plan:      The patient's hypertensive risk group is category B: At least one risk factor (excluding diabetes) with no target organ damage.  Her calculated 10 year risk of coronary heart disease is 15 %.  Today's blood pressure is 177/108.  Her blood pressure goal is < 140/90. Prescriptions: TOPROL XL 100 MG  TB24 (METOPROLOL SUCCINATE) 1 by mouth qd  #30 x 6   Entered and Authorized by:   [name] MD   Signed by:   [name] MD on 11/25/2009   Method used:   Electronically to        [facility] (retail)       [address]       [place], [place]  [address]       Ph:  [phone]       Fax: [phone]   RxID:   [id number]   Appended Document: fup HTN Called roy at [facility]. will have pt pick up tribenzor rx and coupon card and see if can process without insurance and get the $25 price. Calo pt adn let her know can come by and pick up paper rx adn coupon and try to have Channing Mutters at [facility] run it through.  March  2, 20113:52 PM Dyer Klug MD, Santina Evans  11/28/2009 @ 8:11am- Pt notified. KJ LPN  Prescriptions: TRIBENZOR 40-10-25 MG TABS (OLMESARTAN-AMLODIPINE-HCTZ) Take 1 tablet by mouth once a day  #30 x 1   Entered and Authorized by:  [name] MD   Signed by:   [name] MD on 11/28/2009   Method used:   Print then Give to Patient   RxID:   [id number]

## 2010-10-28 NOTE — Progress Notes (Signed)
Summary: BP readings  Phone Note Call from Patient Call back at Home Phone [phone]   Caller: Patient Call For: [name] MD Summary of Call: Pt calls with her BP readings since starting the BP med you put her on  128/68, 130/79, 118/78, 92/60, 122/80, 108/76. Pt states feeling good and has her followup at end of April Initial call taken by: Kathlene November,  December 17, 2009 8:50 AM  Follow-up for Phone Call        Awesome!  Keep f/u appt.  Follow-up by: [name] MD,  December 17, 2009 10:33 AM

## 2010-10-28 NOTE — Assessment & Plan Note (Signed)
Summary: Fatigue   Vital Signs:  Patient profile:   75 year old female Height:      66 inches Weight:      167 pounds Pulse rate:   67 / minute BP sitting:   123 / 84  (right arm) Cuff size:   regular  Vitals Entered By: Avon Gully CMA, Duncan Dull) (September 04, 2010 8:38 AM) CC: fatigue,no energy   Primary Care Provider:  [name] MD  CC:  fatigue and no energy.  History of Present Illness: Has felt run down for about a month. Has been going to [employer] (exercise program) and has lot 5 lbs. No pain, CP, SOB or swelling. Also around that same time she started weaning her seroquel. She is not sure if these two are related. The seroquel is expensive and she says it does help her rest. No other sxs.   Current Medications (verified): 1)  Seroquel 100 Mg Tabs (Quetiapine Fumarate) .... Take 1 Tablet By Mouth Once A Day 2)  Butalbital Compound/asa 50-325-40 Mg Caps (Butalbital-Aspirin-Caffeine) .... Take 1 To 2 Capsule By Mouth Up To Once A Day As Needed 3)  Alprazolam 1 Mg  Tabs (Alprazolam) .... Take One To Two  Tablet Daily As Needed 4)  Fish Oil 1000 Mg Caps (Omega-3 Fatty Acids) .... 4 Tabs By Mouth Once A Day 5)  Tribenzor 40-10-25 Mg Tabs (Olmesartan-Amlodipine-Hctz) .... Take 1 Tablet By Mouth Once A Day  Allergies (verified): No Known Drug Allergies  Comments:  Nurse/Medical Assistant: The patient's medications and allergies were reviewed with the patient and were updated in the Medication and Allergy Lists. Avon Gully CMA, Duncan Dull) (September 04, 2010 8:39 AM)  Family History: Reviewed history from 04/09/2008 and no changes required. Mother with Heart Dx 2 sisters with DM,choleserol, HTN  Social History: Reviewed history from 04/09/2008 and no changes required. Retired . HS degree. married to [place] with one adult son.  Never Smoked Alcohol use-no Drug use-no Regular exercise-no  Physical Exam  General:  Well-developed,well-nourished,in no  acute distress; alert,appropriate and cooperative throughout examination Head:  Normocephalic and atraumatic without obvious abnormalities. No apparent alopecia or balding. Eyes:  No corneal or conjunctival inflammation noted. EOMI. Perrla. Ears:  External ear exam shows no significant lesions or deformities.  Otoscopic examination reveals clear canals, tympanic membranes are intact bilaterally without bulging, retraction, inflammation or discharge. Hearing is grossly normal bilaterally. Nose:  External nasal examination shows no deformity or inflammation. Nasal mucosa are pink and moist without lesions or exudates. Mouth:  Oral mucosa and oropharynx without lesions or exudates.  Teeth in good repair. Neck:  No deformities, masses, or tenderness noted. No TM.  Lungs:  Normal respiratory effort, chest expands symmetrically. Lungs are clear to auscultation, no crackles or wheezes. Heart:  Normal rate and regular rhythm. S1 and S2 normal without gallop, murmur, click, rub or other extra sounds.  Pulses:  Radial 2+  Extremities:  No LE edema.  Skin:  no rashes.   Cervical Nodes:  No lymphadenopathy noted Psych:  Cognition and judgment appear intact. Alert and cooperative with normal attention span and concentration. No apparent delusions, illusions, hallucinations   Impression & Recommendations:  Problem # 1:  FATIGUE (ICD-780.79) Assessment New Congratulated her on her weight loss.  Given a PNA vaccine today.  Also given stool cards  Will get screening labs. Hasn't had labwork in over a year.  Consider this may be from weaning her seroquel since this does help her rest better.  Orders: T-Comprehensive Metabolic Panel [phone]) T-TSH [phone]) T-CBC w/Diff [id number]) T-Lipid Profile [id number]) T-Vitamin B12 [id number]) T-Vitamin D (25-Hydroxy) [id number]) T-Folate (64332)  Complete Medication List: 1)  Seroquel 100 Mg Tabs (Quetiapine fumarate) .... Take 1 tablet by  mouth once a day 2)  Butalbital Compound/asa 50-325-40 Mg Caps (Butalbital-aspirin-caffeine) .... Take 1 to 2 capsule by mouth up to once a day as needed 3)  Alprazolam 1 Mg Tabs (Alprazolam) .... Take one to two  tablet daily as needed 4)  Fish Oil 1000 Mg Caps (Omega-3 fatty acids) .... 4 tabs by mouth once a day 5)  Tribenzor 40-10-25 Mg Tabs (Olmesartan-amlodipine-hctz) .... Take 1 tablet by mouth once a day  Other Orders: Pneumococcal Vaccine (95188) Admin 1st Vaccine (41660)  Patient Instructions: 1)  Follow up in 3 months.  2)  We will call you with your lab results.    Orders Added: 1)  T-Comprehensive Metabolic Panel [[id number]] 2)  T-TSH [[id number]] 3)  T-CBC w/Diff [[id number]] 4)  T-Lipid Profile [[id number]] 5)  T-Vitamin B12 [82607-23330] 6)  T-Vitamin D (25-Hydroxy) [[id number]] 7)  T-Folate [23340] 8)  Pneumococcal Vaccine [90732] 9)  Admin 1st Vaccine [90471] 10)  Est. Patient Level IV [07371]   Immunization History:  Influenza Immunization History:    Influenza:  historical (06/28/2010)  Immunizations Administered:  Pneumonia Vaccine:    Vaccine Type: Pneumovax (Medicare)    Site: left deltoid    Mfr: Merck    Dose: 0.5 ml    Route: IM    Given by: Sue Lush McCrimmon CMA, (AAMA)    Exp. Date: 01/23/2012    Lot #: [id number]    VIS given: 09/02/09 version given September 04, 2010.   Immunization History:  Influenza Immunization History:    Influenza:  Historical (06/28/2010)  Immunizations Administered:  Pneumonia Vaccine:    Vaccine Type: Pneumovax (Medicare)    Site: left deltoid    Mfr: Merck    Dose: 0.5 ml    Route: IM    Given by: Sue Lush McCrimmon CMA, (AAMA)    Exp. Date: 01/23/2012    Lot #: [id number]    VIS given: 09/02/09 version given September 04, 2010.  Bone Density Result Date:  11/05/2008 Bone Density Result:  abnormal Bone Density Next Due: 2 yr    Immunization History:  Influenza Immunization History:    Influenza:   historical (06/28/2010)

## 2010-10-28 NOTE — Assessment & Plan Note (Signed)
Summary: HTN   Vital Signs:  Patient profile:   75 year old female Height:      66 inches Weight:      172 pounds Pulse rate:   87 / minute BP sitting:   120 / 81  (left arm) Cuff size:   large  Vitals Entered By: Kathlene November (January 13, 2010 9:09 AM) CC: followup BP, Hypertension Management   CC:  followup BP and Hypertension Management.  Hypertension History:      She denies headache, chest pain, palpitations, dyspnea with exertion, orthopnea, PND, peripheral edema, visual symptoms, neurologic problems, syncope, and side effects from treatment.  She notes no problems with any antihypertensive medication side effects.  BPs at home have been 92-130/70-85.  Marland Kitchen        Positive major cardiovascular risk factors include female age 88 years old or older, hyperlipidemia, and hypertension.  Negative major cardiovascular risk factors include non-tobacco-user status.     Current Medications (verified): 1)  Seroquel 100 Mg Tabs (Quetiapine Fumarate) .... Take 1 Tablet By Mouth Once A Day 2)  Butalbital Compound/asa 50-325-40 Mg Caps (Butalbital-Aspirin-Caffeine) .... Take 1 To 2 Capsule By Mouth Up To Once A Day As Needed 3)  Alprazolam 1 Mg  Tabs (Alprazolam) .... Take One To Two  Tablet Daily As Needed 4)  Simvastatin 40 Mg Tabs (Simvastatin) .... Take 1 Tablet By Mouth Once A Day At Bedtime 5)  Fish Oil 1000 Mg Caps (Omega-3 Fatty Acids) .... 4 Tabs By Mouth Once A Day 6)  Tribenzor 40-10-25 Mg Tabs (Olmesartan-Amlodipine-Hctz) .... Take 1 Tablet By Mouth Once A Day  Allergies (verified): No Known Drug Allergies  Comments:  Nurse/Medical Assistant: The patient's medications and allergies were reviewed with the patient and were updated in the Medication and Allergy Lists. Kathlene November (January 13, 2010 9:10 AM)  Physical Exam  General:  Well-developed,well-nourished,in no acute distress; alert,appropriate and cooperative throughout examination Head:  Normocephalic and atraumatic without  obvious abnormalities. No apparent alopecia or balding. Eyes:  No corneal or conjunctival inflammation noted. EOMI. Perrla.  Lungs:  Normal respiratory effort, chest expands symmetrically. Lungs are clear to auscultation, no crackles or wheezes. Heart:  Normal rate and regular rhythm. S1 and S2 normal without gallop, murmur, click, rub or other extra sounds. Skin:  no rashes.   Psych:  Cognition and judgment appear intact. Alert and cooperative with normal attention span and concentration. No apparent delusions, illusions, hallucinations   Impression & Recommendations:  Problem # 1:  HYPERTENSION (ICD-401.9) Looks great on the [employer] alone. Says she feels well overall.  A little lightheaded if stands up quickly.    The following medications were removed from the medication list:    Toprol Xl 100 Mg Tb24 (Metoprolol succinate) .Marland Kitchen... 1 by mouth qd    Benicar Hct 40-25 Mg Tabs (Olmesartan medoxomil-hctz) .Marland Kitchen... Take 1 tablet by mouth once a day    Norvasc 10 Mg Tabs (Amlodipine besylate) .Marland Kitchen... Take 1 tablet by mouth once a day    Clonidine Hcl 0.2 Mg Tabs (Clonidine hcl) .Marland Kitchen... Take 1 tablet by mouth two times a day Her updated medication list for this problem includes:    Tribenzor 40-10-25 Mg Tabs (Olmesartan-amlodipine-hctz) .Marland Kitchen... Take 1 tablet by mouth once a day  Complete Medication List: 1)  Seroquel 100 Mg Tabs (Quetiapine fumarate) .... Take 1 tablet by mouth once a day 2)  Butalbital Compound/asa 50-325-40 Mg Caps (Butalbital-aspirin-caffeine) .... Take 1 to 2 capsule by mouth up to once  a day as needed 3)  Alprazolam 1 Mg Tabs (Alprazolam) .... Take one to two  tablet daily as needed 4)  Simvastatin 40 Mg Tabs (Simvastatin) .... Take 1 tablet by mouth once a day at bedtime 5)  Fish Oil 1000 Mg Caps (Omega-3 fatty acids) .... 4 tabs by mouth once a day 6)  Tribenzor 40-10-25 Mg Tabs (Olmesartan-amlodipine-hctz) .... Take 1 tablet by mouth once a day  Hypertension Assessment/Plan:       The patient's hypertensive risk group is category B: At least one risk factor (excluding diabetes) with no target organ damage.  Her calculated 10 year risk of coronary heart disease is 9 %.  Today's blood pressure is 120/81.  Her blood pressure goal is < 140/90.  Patient Instructions: 1)  Please schedule a follow-up appointment in 4 months for blood pressure.

## 2010-10-28 NOTE — Assessment & Plan Note (Signed)
Summary: L ear pain   Vital Signs:  Patient profile:   75 year old female Height:      66 inches Weight:      172 pounds BMI:     27.86 O2 Sat:      98 % on Room air Temp:     98.2 degrees F oral Pulse rate:   92 / minute BP sitting:   112 / 78  (left arm) Cuff size:   regular  Vitals Entered By: Payton Spark CMA (April 16, 2010 10:23 AM)  O2 Flow:  Room air CC: L ear pain x 1 day.   Primary Care Provider:  [name] MD  CC:  L ear pain x 1 day.Marland Kitchen  History of Present Illness: 75 yo WF presents for L ear pain that started last night.  Denies any drainage.  Denies any trouble hearing.  No dizziness.  Denies much rhinorrhea or head congestion.    Allergies: No Known Drug Allergies  Past History:  Past Medical History: Reviewed history from 10/04/2008 and no changes required. Anxiety Hypertension Stress TESt (? date)  Social History: Reviewed history from 04/09/2008 and no changes required. Retired . HS degree. married to [place] with one adult son.  Never Smoked Alcohol use-no Drug use-no Regular exercise-no  Review of Systems      See HPI  Physical Exam  General:  alert, well-developed, well-nourished, and well-hydrated.   Ears:  EACs patent; TMs translucent and gray with good cone of light and bony landmarks.  no external tendernss  Mouth:  pharynx pink and moist.  popping and clicking over the R>L TMJ   Impression & Recommendations:  Problem # 1:  EAR PAIN, LEFT (ICD-388.70) Normal ear exam.  DDX includes TMJ/ dental pain/ sinus problems.  Since she is really not having any other symptoms, will proceed with supportive care -- use of warm compress, tylenol, sweet oil drops.  Complete Medication List: 1)  Seroquel 100 Mg Tabs (Quetiapine fumarate) .... Take 1 tablet by mouth once a day 2)  Butalbital Compound/asa 50-325-40 Mg Caps (Butalbital-aspirin-caffeine) .... Take 1 to 2 capsule by mouth up to once a day as needed 3)  Alprazolam 1  Mg Tabs (Alprazolam) .... Take one to two  tablet daily as needed 4)  Simvastatin 40 Mg Tabs (Simvastatin) .... Take 1 tablet by mouth once a day at bedtime 5)  Fish Oil 1000 Mg Caps (Omega-3 fatty acids) .... 4 tabs by mouth once a day 6)  Tribenzor 40-10-25 Mg Tabs (Olmesartan-amlodipine-hctz) .... Take 1 tablet by mouth once a day

## 2011-01-01 ENCOUNTER — Other Ambulatory Visit: Payer: Self-pay | Admitting: Family Medicine

## 2011-01-05 ENCOUNTER — Other Ambulatory Visit: Payer: Self-pay | Admitting: Family Medicine

## 2011-01-26 ENCOUNTER — Encounter: Payer: Self-pay | Admitting: Family Medicine

## 2011-01-26 ENCOUNTER — Ambulatory Visit (INDEPENDENT_AMBULATORY_CARE_PROVIDER_SITE_OTHER): Payer: Medicare Other | Admitting: Family Medicine

## 2011-01-26 DIAGNOSIS — E785 Hyperlipidemia, unspecified: Secondary | ICD-10-CM

## 2011-01-26 DIAGNOSIS — I10 Essential (primary) hypertension: Secondary | ICD-10-CM

## 2011-01-26 DIAGNOSIS — E559 Vitamin D deficiency, unspecified: Secondary | ICD-10-CM

## 2011-01-26 DIAGNOSIS — F329 Major depressive disorder, single episode, unspecified: Secondary | ICD-10-CM

## 2011-01-26 NOTE — Assessment & Plan Note (Signed)
Due to recheck. She is taking half a tab of her med.

## 2011-01-26 NOTE — Progress Notes (Signed)
  Subjective:    Patient ID: [name], female    DOB: August 13, 1936, 74 y.o.   MRN: [id number]  Hypertension The current episode started more than 1 year ago. The problem is unchanged. The problem is controlled. Pertinent negatives include no headaches, palpitations or shortness of breath. There are no associated agents to hypertension. Risk factors for coronary artery disease include obesity and sedentary lifestyle. Past treatments include ACE inhibitors, diuretics and calcium channel blockers. There are no compliance problems.       Review of Systems  Respiratory: Negative for shortness of breath.   Cardiovascular: Negative for palpitations.  Neurological: Negative for headaches.       Objective:   Physical Exam  Constitutional: She is oriented to person, place, and time. She appears well-developed and well-nourished.  HENT:  Head: Normocephalic and atraumatic.  Cardiovascular: Normal rate and regular rhythm.   Murmur heard.      2/6 SEM  Pulmonary/Chest: Effort normal and breath sounds normal.  Neurological: She is alert and oriented to person, place, and time.  Skin: Skin is warm and dry.  Psychiatric: She has a normal mood and affect.          Assessment & Plan:  Vit D def - Due to rehceck.

## 2011-01-26 NOTE — Assessment & Plan Note (Signed)
BP looks great today but cost is an issue. I gave her another copay card today. If doesn't work will change her to losarrtan hct with amlodipine separately.  Then f/u in 3 months. She has done fantastic on the Tribenzor.

## 2011-01-26 NOTE — Assessment & Plan Note (Signed)
She is well controlled. She is happy with her current regimen. I didn discuss the importance of regular exercise.

## 2011-02-10 NOTE — Assessment & Plan Note (Signed)
[facility]                            CARDIOLOGY OFFICE NOTE   PEBBLE, BOTKIN                      MRN:          [id number]  DATE:04/15/2007                            DOB:          06/01/1936    Mrs. [name] is a pleasant 75 year old patient referred by Dr. Blossom Hoops  for chest pain and shortness of breath.   The patient has no documented history of coronary disease.  Her chest  pain is fairly atypical.  She has had 2 episodes in the last month.  She  describes it as a pressure.  There is no radiation, there is no  diaphoresis or shortness of breath.  She has not had any recurrences  this month.  The patient feels she is under a lot of stress.  Her  biggest stress has to do with moving.  She has been married for 50  years, and her and her husband are going to assisted living in  [place] at [facility].   The patient's coronary risk factors include hypertension,  hypercholesterolemia.  She is a nondiabetic, nonsmoker, although she  worked in tobacco fields for a long time.   PMH:  Only as noted above  HTN, Dyslipidemia   The patient has otherwise been fairly healthy.  Review of systems is  negative.  She is fairly sedentary.  She gets functional dyspnea with  activity.  There is no wheezing, sputum production, or cough.   Her family history is noncontributory.   She denies any allergies.   As indicated, she is happily married.  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  She retired in 2005.  She has one older child  and 3 grandchildren that she gets to see on regular basis.  Her  husband's health is fairly good, although he did have surgery by Dr.  Edwyna Shell for lung cancer 10 years ago.  She really does not have any other  hobbies.   Her medications include:  1. Vytorin 10/20.  2. Metoprolol 100 b.i.d.  3. Micardis 80/12.5.   Exam is remarkable for an overweight, elderly white female in no  distress.   Affect is appropriate.  Blood pressure is 150/80, pulse 56 and regular.  Respiratory rate is 14.  She is afebrile.  HEENT:  Normal.  Carotids are normal without bruit.  There is no lymphadenopathy, no JVP  elevation, no thyromegaly.  Lungs are clear with good diaphragmatic motion.  There is an S1 and S2 with normal heart sounds.  PMI is normal.  Abdomen is benign.  There are no renal bruits.  There is no tenderness,  no triple A, no hepatosplenomegaly, and no hepatojugular reflux.  Femorals are +3 bilaterally with no bruits.  Distal pulses are intact  with no edema.  Neuro is nonfocal.  There is no muscular weakness.   Baseline EKG is normal.   IMPRESSION:  1. Chest pressure in the setting of stress, given risk factors and      age, she will be referred for a stress Myoview.  2. Hypertension currently appears reasonably well  controlled.      Continue low-salt diet as well as Micardis and metoprolol.  3. Hyperlipidemia, follow up with Dr. Blossom Hoops, continue Vytorin      10/20.  Follow up lipid and liver profile in 6 months.  4. Stress.  The patient may benefit from some Prozac or p.r.n. Xanax.      I will leave this up to Dr. Blossom Hoops.  Suspect that the situation,      once she settles into her new apartment she will be fine.     [name]. Eden Emms, MD, [facility]  Electronically Signed    PCN/MedQ  DD: 04/15/2007  DT: 04/15/2007  Job #: [id number]   cc:   Leanne Chang, M.D.

## 2011-02-12 ENCOUNTER — Other Ambulatory Visit: Payer: Self-pay | Admitting: Family Medicine

## 2011-02-13 NOTE — Discharge Summary (Signed)
NAME:  [name], [name]                         ACCOUNT NO.:  [id number]   MEDICAL RECORD NO.:  [id number]                   PATIENT TYPE:  OBV   LOCATION:  3740                                 FACILITY:  [facility]   PHYSICIAN:  Lazaro Arms, M.D.        DATE OF BIRTH:  [birth year]   DATE OF ADMISSION:  01/29/2003  DATE OF DISCHARGE:  01/30/2003                                 DISCHARGE SUMMARY   PRIMARY CARE PHYSICIAN:  Sharlet Salina, M.D.   DISCHARGE DIAGNOSES:  1. Hypertensive urgency.  2. Hyperlipidemia.  3. Anxiety disorder.  4. Insomnia.  5. Chronic headaches.  6. Chronic low back pain.   HISTORY OF PRESENT ILLNESS:  The patient was referred to the emergency room  by her primary care physician for blood pressures at 210/125.  Her only  complaint was some hot flushes and some headaches which began about two days  prior.  She denied any blurry vision or gait disturbance.  In the emergency  room, she did have indeed a high blood pressure and was given some labetalol  IV and admitted to the floor to telemetry.   HOSPITAL COURSE:  She did well over night and in the morning, at midnight,  her blood pressure was 140/80.  Her 8 a.m. blood pressure was 159/108.  She  was restarted on her normal medicines, Toprol 100 mg p.o. daily,  Atacand/hydrochlorothiazide 160/12.5 b.i.d., and her blood pressure was  monitored. At 1 p.m. her blood pressure was 190/115.  At this point,  labetalol p.o. was added and three hours later, her blood pressure was  140/90.  Pulse was 65.  The patient was asymptomatic and wished to go home  and she agreed to monitor her blood pressure at home as she has been meaning  to do so.   DISCHARGE INSTRUCTIONS:  She should check her blood pressure tomorrow  morning two hours after her medicines and she was to call Dr. Marny Lowenstein for  blood pressures with systolics greater than 180 or diastolics greater than  110.   FOLLOW UP:  She is to follow up with  Dr. Marny Lowenstein on Thursday or Friday.   DISCHARGE MEDICATIONS:  1. Atacand/hydrochlorothiazide 160/12.5 b.i.d.  2. Toprol XL 100 mg p.o. daily.  3. Lipitor 20 mg p.o. daily.  4. Aspirin.  5.     Labetalol 200 mg p.o. b.i.d.  She was told to take only one pill tonight     since she got the two pills quite late and she will restart tomorrow.   Dr. Marny Lowenstein may wish to change her medicines around as an outpatient,  although this regimen seems to work for her.                                               Dewayne Hatch  Laurette Schimke, M.D.    [facility]  D:  01/30/2003  T:  02/01/2003  Job:  [id number]   cc:   Sharlet Salina, M.D.  [address]  [place]  [place] [address]  Fax: [phone]

## 2011-02-16 ENCOUNTER — Other Ambulatory Visit: Payer: Self-pay | Admitting: *Deleted

## 2011-02-16 MED ORDER — BUTALBITAL-APAP-CAFF-COD 50-325-40-30 MG PO CAPS: 2.0000 | ORAL_CAPSULE | Freq: Every day | ORAL | Status: DC | PRN

## 2011-02-16 MED ORDER — QUETIAPINE FUMARATE 100 MG PO TABS: 100.0000 mg | ORAL_TABLET | Freq: Every day | ORAL | Status: DC

## 2011-02-19 ENCOUNTER — Telehealth: Payer: Self-pay | Admitting: Family Medicine

## 2011-02-19 LAB — LIPID PANEL
HDL: 51 mg/dL (ref >39)
LDL Cholesterol: 117 mg/dL — ABNORMAL HIGH (ref 0–99)
Total CHOL/HDL Ratio: 3.9 Ratio
VLDL: 30 mg/dL (ref 0–40)

## 2011-02-19 LAB — COMPLETE METABOLIC PANEL WITH GFR
ALT: 13 U/L (ref 0–35)
AST: 14 U/L (ref 0–37)
Albumin: 3.9 g/dL (ref 3.5–5.2)
Alkaline Phosphatase: 59 U/L (ref 39–117)
GFR, Est Non African American: 44 mL/min — ABNORMAL LOW (ref >60)
Potassium: 4.5 mEq/L (ref 3.5–5.3)
Sodium: 140 mEq/L (ref 135–145)
Total Bilirubin: 0.2 mg/dL — ABNORMAL LOW (ref 0.3–1.2)
Total Protein: 7 g/dL (ref 6.0–8.3)

## 2011-02-19 MED ORDER — OLMESARTAN-AMLODIPINE-HCTZ 40-10-12.5 MG PO TABS: 1.0000 | ORAL_TABLET | Freq: Every day | ORAL | Status: DC

## 2011-02-19 NOTE — Telephone Encounter (Signed)
Call patient: her vitamin D does look better. It still on the low end of normal so continue the vitamin D supplement. Cholesterol is significantly higher than last time. I would really like her to increase her cholesterol medication back to a whole tablet she is able to. Appetite is just not enough to actually control her cholesterol. Then recheck the levels in 8 weeks. Her kidney function is up just a little bit so will decrease the fluid part of her blood pressure pill. I will send her for new prescription today. Followup in one month if she doesn't already have an appointment to recheck her blood pressure on the new dose.

## 2011-02-20 NOTE — Telephone Encounter (Signed)
Called home number and there was no answer ; left message to call back

## 2011-02-24 ENCOUNTER — Telehealth: Payer: Self-pay | Admitting: Family Medicine

## 2011-02-24 NOTE — Telephone Encounter (Signed)
Pt notified of all her lab results and follow up appts and medication dosing changes, and follow up lab appt.  Pt voiced understanding. Jarvis Newcomer, LPN Domingo Dimes

## 2011-02-26 NOTE — Telephone Encounter (Signed)
Not sure why this encounter is still showing open therefore closed this encounter. Jarvis Newcomer, LPN Domingo Dimes

## 2011-03-13 ENCOUNTER — Other Ambulatory Visit: Payer: Self-pay | Admitting: Family Medicine

## 2011-03-31 ENCOUNTER — Other Ambulatory Visit: Payer: Self-pay | Admitting: Family Medicine

## 2011-04-15 ENCOUNTER — Other Ambulatory Visit: Payer: Self-pay | Admitting: Family Medicine

## 2011-04-24 ENCOUNTER — Encounter: Payer: Self-pay | Admitting: Family Medicine

## 2011-04-24 ENCOUNTER — Ambulatory Visit (INDEPENDENT_AMBULATORY_CARE_PROVIDER_SITE_OTHER): Payer: Medicare Other | Admitting: Family Medicine

## 2011-04-24 DIAGNOSIS — R5381 Other malaise: Secondary | ICD-10-CM

## 2011-04-24 DIAGNOSIS — I10 Essential (primary) hypertension: Secondary | ICD-10-CM

## 2011-04-24 DIAGNOSIS — R5383 Other fatigue: Secondary | ICD-10-CM

## 2011-04-24 DIAGNOSIS — E785 Hyperlipidemia, unspecified: Secondary | ICD-10-CM

## 2011-04-24 NOTE — Assessment & Plan Note (Signed)
BP looked so good last time though up today. She says she feels really anxious today. Not sure why. She did take it.  Recheck in 4 months.

## 2011-04-24 NOTE — Assessment & Plan Note (Addendum)
Recheck LDL today. May need to increase statin if not less than 100.

## 2011-04-24 NOTE — Progress Notes (Signed)
  Subjective:    Patient ID: [name], female    DOB: July 31, 1936, 75 y.o.   MRN: [id number]  Hypertension This is a chronic problem. The current episode started more than 1 year ago. The problem is uncontrolled. Pertinent negatives include no peripheral edema or shortness of breath. There are no associated agents to hypertension. Risk factors for coronary artery disease include no known risk factors. Past treatments include calcium channel blockers, diuretics and angiotensin blockers. The current treatment provides moderate improvement. There are no compliance problems.   this morning BP was 140/80.  Occ dizziness when stands up too quickly.     Review of Systems  Respiratory: Negative for shortness of breath.        Objective:   Physical Exam  Constitutional: She is oriented to person, place, and time. She appears well-developed and well-nourished.  HENT:  Head: Normocephalic and atraumatic.  Cardiovascular: Normal rate, regular rhythm and normal heart sounds.   Pulmonary/Chest: Effort normal and breath sounds normal.  Musculoskeletal: She exhibits no edema.  Neurological: She is alert and oriented to person, place, and time.  Skin: Skin is warm and dry.  Psychiatric: She has a normal mood and affect. Her behavior is normal.          Assessment & Plan:

## 2011-04-25 LAB — BASIC METABOLIC PANEL WITH GFR
BUN: 21 mg/dL (ref 6–23)
Calcium: 10.2 mg/dL (ref 8.4–10.5)
GFR, Est African American: >60 mL/min (ref >60)
Glucose, Bld: 95 mg/dL (ref 70–99)
Sodium: 139 mEq/L (ref 135–145)

## 2011-04-25 LAB — TSH: TSH: 1.595 u[IU]/mL (ref 0.350–4.500)

## 2011-04-26 ENCOUNTER — Telehealth: Payer: Self-pay | Admitting: Family Medicine

## 2011-04-26 MED ORDER — SIMVASTATIN 40 MG PO TABS: 40.0000 mg | ORAL_TABLET | Freq: Every day | ORAL | Status: DC

## 2011-04-26 NOTE — Telephone Encounter (Signed)
Call pt: CMP and thyroid are nl.  LDL chol is high I will in c simvastatin to 40mg .

## 2011-04-27 NOTE — Telephone Encounter (Signed)
Left message on vm

## 2011-05-05 ENCOUNTER — Other Ambulatory Visit: Payer: Self-pay | Admitting: Family Medicine

## 2011-05-05 DIAGNOSIS — Z1231 Encounter for screening mammogram for malignant neoplasm of breast: Secondary | ICD-10-CM

## 2011-05-22 ENCOUNTER — Other Ambulatory Visit: Payer: Self-pay | Admitting: Family Medicine

## 2011-06-02 ENCOUNTER — Ambulatory Visit
Admission: RE | Admit: 2011-06-02 | Discharge: 2011-06-02 | Disposition: A | Discharge status: Home / Self Care | Payer: Medicare Other | Source: Ambulatory Visit | Attending: Family Medicine | Admitting: Family Medicine

## 2011-06-02 DIAGNOSIS — Z1231 Encounter for screening mammogram for malignant neoplasm of breast: Secondary | ICD-10-CM

## 2011-06-03 ENCOUNTER — Telehealth: Payer: Self-pay | Admitting: Family Medicine

## 2011-06-03 NOTE — Telephone Encounter (Signed)
Pt notifeid of results. KJ LPN 

## 2011-06-03 NOTE — Telephone Encounter (Signed)
Please call patient. Normal mammogram.  Repeat in 1 year.  

## 2011-06-12 ENCOUNTER — Other Ambulatory Visit: Payer: Self-pay | Admitting: Family Medicine

## 2011-06-24 ENCOUNTER — Other Ambulatory Visit: Payer: Self-pay | Admitting: Family Medicine

## 2011-07-14 ENCOUNTER — Other Ambulatory Visit: Payer: Self-pay | Admitting: Family Medicine

## 2011-07-14 NOTE — Telephone Encounter (Signed)
Pt notified that a script for her xanex medication was faxed this am to [facility]. Jarvis Newcomer, LPN Domingo Dimes

## 2011-07-20 ENCOUNTER — Other Ambulatory Visit: Payer: Self-pay | Admitting: Family Medicine

## 2011-08-02 ENCOUNTER — Other Ambulatory Visit: Payer: Self-pay | Admitting: Family Medicine

## 2011-08-04 ENCOUNTER — Other Ambulatory Visit: Payer: Self-pay | Admitting: *Deleted

## 2011-08-04 MED ORDER — ALPRAZOLAM 1 MG PO TABS: ORAL_TABLET | ORAL | Status: DC

## 2011-08-05 ENCOUNTER — Other Ambulatory Visit: Payer: Self-pay | Admitting: Family Medicine

## 2011-08-14 ENCOUNTER — Ambulatory Visit (INDEPENDENT_AMBULATORY_CARE_PROVIDER_SITE_OTHER): Payer: Medicare Other | Admitting: Family Medicine

## 2011-08-14 ENCOUNTER — Encounter: Payer: Self-pay | Admitting: Family Medicine

## 2011-08-14 VITALS — BP 120/73 | HR 104 | Wt 190.0 lb

## 2011-08-14 DIAGNOSIS — M545 Low back pain: Secondary | ICD-10-CM

## 2011-08-14 DIAGNOSIS — R51 Headache: Secondary | ICD-10-CM

## 2011-08-14 LAB — POCT URINALYSIS DIPSTICK
Bilirubin, UA: NEGATIVE
Blood, UA: NEGATIVE
Glucose, UA: NEGATIVE
Spec Grav, UA: 1.02

## 2011-08-14 MED ORDER — CYCLOBENZAPRINE HCL 5 MG PO TABS: 5.0000 mg | ORAL_TABLET | Freq: Every evening | ORAL | Status: AC | PRN

## 2011-08-14 MED ORDER — BUTALBITAL-ASA-CAFFEINE 50-325-40 MG PO CAPS: 1.0000 | ORAL_CAPSULE | Freq: Two times a day (BID) | ORAL | Status: DC | PRN

## 2011-08-14 NOTE — Progress Notes (Signed)
  Subjective:    Patient ID: [name], female    DOB: 1935/11/19, 75 y.o.   MRN: [id number]  HPI Low back pain for 3 weeks.  Intermittant ankle swelling.  Not swollen today.  Occ pain into her right hip. Worse in the AM when first gets up in the AM.  Worse with bending over. No meds for pain. Worse with prolonged sitting. No known trauma or injury.    Review of Systems     Objective:   Physical Exam  Constitutional: She is oriented to person, place, and time. She appears well-developed and well-nourished.  HENT:  Head: Normocephalic and atraumatic.  Cardiovascular: Normal rate, regular rhythm and normal heart sounds.   Pulmonary/Chest: Effort normal and breath sounds normal.  Musculoskeletal: She exhibits no edema.       Normal flexion, extension, rotation right and left nd side bending.  Non tender over the lumbar spine and paraspinous muscles.  Neg straiht leg raise, Hip, knee and ankle strength 5/5 bilat. Patellar reflexes 2+ bilat.   Neurological: She is alert and oriented to person, place, and time.  Skin: Skin is warm and dry.  Psychiatric: She has a normal mood and affect. Her behavior is normal.          Assessment & Plan:  Low Back Pain - Discussed stretches and trial of muscle relaxer at bedtime. If not getting better in 2-3 weeks then consider furher w/u with imaging since she is elderly. Also consider PT. Warned her not to mix her xanax and her.She really has NROM of her low back.   HA- needs refill on her fiorinal.

## 2011-08-14 NOTE — Patient Instructions (Signed)
Low Back Sprain with Rehab  A sprain is an injury in which a ligament is torn. The ligaments of the lower back are vulnerable to sprains. However, they are strong and require great force to be injured. These ligaments are important for stabilizing the spinal column. Sprains are classified into three categories. Grade 1 sprains cause pain, but the tendon is not lengthened. Grade 2 sprains include a lengthened ligament, due to the ligament being stretched or partially ruptured. With grade 2 sprains there is still function, although the function may be decreased. Grade 3 sprains involve a complete tear of the tendon or muscle, and function is usually impaired. SYMPTOMS   Severe pain in the lower back.   Sometimes, a feeling of a "pop," "snap," or tear, at the time of injury.   Tenderness and sometimes swelling at the injury site.   Uncommonly, bruising (contusion) within 48 hours of injury.   Muscle spasms in the back.  CAUSES  Low back sprains occur when a force is placed on the ligaments that is greater than they can handle. Common causes of injury include:  Performing a stressful act while off-balance.   Repetitive stressful activities that involve movement of the lower back.   Direct hit (trauma) to the lower back.  RISK INCREASES WITH:  Contact sports (football, wrestling).   Collisions (major skiing accidents).   Sports that require throwing or lifting (baseball, weightlifting).   Sports involving twisting of the spine (gymnastics, diving, tennis, golf).   Poor strength and flexibility.   Inadequate protection.   Previous back injury or surgery (especially fusion).  PREVENTION  Wear properly fitted and padded protective equipment.   Warm up and stretch properly before activity.   Allow for adequate recovery between workouts.   Maintain physical fitness:   Strength, flexibility, and endurance.   Cardiovascular fitness.   Maintain a healthy body weight.  PROGNOSIS   If treated properly, low back sprains usually heal with non-surgical treatment. The length of time for healing depends on the severity of the injury.  RELATED COMPLICATIONS   Recurring symptoms, resulting in a chronic problem.   Chronic inflammation and pain in the low back.   Delayed healing or resolution of symptoms, especially if activity is resumed too soon.   Prolonged impairment.   Unstable or arthritic joints of the low back.  TREATMENT  Treatment first involves the use of ice and medicine, to reduce pain and inflammation. The use of strengthening and stretching exercises may help reduce pain with activity. These exercises may be performed at home or with a therapist. Severe injuries may require referral to a therapist for further evaluation and treatment, such as ultrasound. Your caregiver may advise that you wear a back brace or corset, to help reduce pain and discomfort. Often, prolonged bed rest results in greater harm then benefit. Corticosteroid injections may be recommended. However, these should be reserved for the most serious cases. It is important to avoid using your back when lifting objects. At night, sleep on your back on a firm mattress, with a pillow placed under your knees. If non-surgical treatment is unsuccessful, surgery may be needed.  MEDICATION   If pain medicine is needed, nonsteroidal anti-inflammatory medicines (aspirin and ibuprofen), or other minor pain relievers (acetaminophen), are often advised.   Do not take pain medicine for 7 days before surgery.   Prescription pain relievers may be given, if your caregiver thinks they are needed. Use only as directed and only as much as you   need.   Ointments applied to the skin may be helpful.   Corticosteroid injections may be given by your caregiver. These injections should be reserved for the most serious cases, because they may only be given a certain number of times.  HEAT AND COLD  Cold treatment (icing)  should be applied for 10 to 15 minutes every 2 to 3 hours for inflammation and pain, and immediately after activity that aggravates your symptoms. Use ice packs or an ice massage.   Heat treatment may be used before performing stretching and strengthening activities prescribed by your caregiver, physical therapist, or athletic trainer. Use a heat pack or a warm water soak.  SEEK MEDICAL CARE IF:   Symptoms get worse or do not improve in 2 to 4 weeks, despite treatment.   You develop numbness or weakness in either leg.   You lose bowel or bladder function.   Any of the following occur after surgery: fever, increased pain, swelling, redness, drainage of fluids, or bleeding in the affected area.   New, unexplained symptoms develop. (Drugs used in treatment may produce side effects.)  EXERCISES  RANGE OF MOTION (ROM) AND STRETCHING EXERCISES - Low Back Sprain Most people with lower back pain will find that their symptoms get worse with excessive bending forward (flexion) or arching at the lower back (extension). The exercises that will help resolve your symptoms will focus on the opposite motion.  Your physician, physical therapist or athletic trainer will help you determine which exercises will be most helpful to resolve your lower back pain. Do not complete any exercises without first consulting with your caregiver. Discontinue any exercises which make your symptoms worse, until you speak to your caregiver. If you have pain, numbness or tingling which travels down into your buttocks, leg or foot, the goal of the therapy is for these symptoms to move closer to your back and eventually resolve. Sometimes, these leg symptoms will get better, but your lower back pain may worsen. This is often an indication of progress in your rehabilitation. Be very alert to any changes in your symptoms and the activities in which you participated in the 24 hours prior to the change. Sharing this information with your  caregiver will allow him or her to most efficiently treat your condition. These exercises may help you when beginning to rehabilitate your injury. Your symptoms may resolve with or without further involvement from your physician, physical therapist or athletic trainer. While completing these exercises, remember:   Restoring tissue flexibility helps normal motion to return to the joints. This allows healthier, less painful movement and activity.   An effective stretch should be held for at least 30 seconds.   A stretch should never be painful. You should only feel a gentle lengthening or release in the stretched tissue.  FLEXION RANGE OF MOTION AND STRETCHING EXERCISES: STRETCH - Flexion, Single Knee to Chest   Lie on a firm bed or floor with both legs extended in front of you.   Keeping one leg in contact with the floor, bring your opposite knee to your chest. Hold your leg in place by either grabbing behind your thigh or at your knee.   Pull until you feel a gentle stretch in your low back. Hold __________ seconds.   Slowly release your grasp and repeat the exercise with the opposite side.  Repeat __________ times. Complete this exercise __________ times per day.  STRETCH - Flexion, Double Knee to Chest  Lie on a firm bed or   floor with both legs extended in front of you.   Keeping one leg in contact with the floor, bring your opposite knee to your chest.   Tense your stomach muscles to support your back and then lift your other knee to your chest. Hold your legs in place by either grabbing behind your thighs or at your knees.   Pull both knees toward your chest until you feel a gentle stretch in your low back. Hold __________ seconds.   Tense your stomach muscles and slowly return one leg at a time to the floor.  Repeat __________ times. Complete this exercise __________ times per day.  STRETCH - Low Trunk Rotation  Lie on a firm bed or floor. Keeping your legs in front of you, bend  your knees so they are both pointed toward the ceiling and your feet are flat on the floor.   Extend your arms out to the side. This will stabilize your upper body by keeping your shoulders in contact with the floor.   Gently and slowly drop both knees together to one side until you feel a gentle stretch in your low back. Hold for __________ seconds.   Tense your stomach muscles to support your lower back as you bring your knees back to the starting position. Repeat the exercise to the other side.  Repeat __________ times. Complete this exercise __________ times per day  EXTENSION RANGE OF MOTION AND FLEXIBILITY EXERCISES: STRETCH - Extension, Prone on Elbows   Lie on your stomach on the floor, a bed will be too soft. Place your palms about shoulder width apart and at the height of your head.   Place your elbows under your shoulders. If this is too painful, stack pillows under your chest.   Allow your body to relax so that your hips drop lower and make contact more completely with the floor.   Hold this position for __________ seconds.   Slowly return to lying flat on the floor.  Repeat __________ times. Complete this exercise __________ times per day.  RANGE OF MOTION - Extension, Prone Press Ups  Lie on your stomach on the floor, a bed will be too soft. Place your palms about shoulder width apart and at the height of your head.   Keeping your back as relaxed as possible, slowly straighten your elbows while keeping your hips on the floor. You may adjust the placement of your hands to maximize your comfort. As you gain motion, your hands will come more underneath your shoulders.   Hold this position __________ seconds.   Slowly return to lying flat on the floor.  Repeat __________ times. Complete this exercise __________ times per day.  RANGE OF MOTION- Quadruped, Neutral Spine   Assume a hands and knees position on a firm surface. Keep your hands under your shoulders and your knees  under your hips. You may place padding under your knees for comfort.   Drop your head and point your tailbone toward the ground below you. This will round out your lower back like an angry cat. Hold this position for __________ seconds.   Slowly lift your head and release your tail bone so that your back sags into a large arch, like an old horse.   Hold this position for __________ seconds.   Repeat this until you feel limber in your low back.   Now, find your "sweet spot." This will be the most comfortable position somewhere between the two previous positions. This is your neutral spine.   Once you have found this position, tense your stomach muscles to support your low back.   Hold this position for __________ seconds.  Repeat __________ times. Complete this exercise __________ times per day.  STRENGTHENING EXERCISES - Low Back Sprain These exercises may help you when beginning to rehabilitate your injury. These exercises should be done near your "sweet spot." This is the neutral, low-back arch, somewhere between fully rounded and fully arched, that is your least painful position. When performed in this safe range of motion, these exercises can be used for people who have either a flexion or extension based injury. These exercises may resolve your symptoms with or without further involvement from your physician, physical therapist or athletic trainer. While completing these exercises, remember:   Muscles can gain both the endurance and the strength needed for everyday activities through controlled exercises.   Complete these exercises as instructed by your physician, physical therapist or athletic trainer. Increase the resistance and repetitions only as guided.   You may experience muscle soreness or fatigue, but the pain or discomfort you are trying to eliminate should never worsen during these exercises. If this pain does worsen, stop and make certain you are following the directions exactly.  If the pain is still present after adjustments, discontinue the exercise until you can discuss the trouble with your caregiver.  STRENGTHENING - Deep Abdominals, Pelvic Tilt   Lie on a firm bed or floor. Keeping your legs in front of you, bend your knees so they are both pointed toward the ceiling and your feet are flat on the floor.   Tense your lower abdominal muscles to press your low back into the floor. This motion will rotate your pelvis so that your tail bone is scooping upwards rather than pointing at your feet or into the floor.  With a gentle tension and even breathing, hold this position for __________ seconds. Repeat __________ times. Complete this exercise __________ times per day.  STRENGTHENING - Abdominals, Crunches   Lie on a firm bed or floor. Keeping your legs in front of you, bend your knees so they are both pointed toward the ceiling and your feet are flat on the floor. Cross your arms over your chest.   Slightly tip your chin down without bending your neck.   Tense your abdominals and slowly lift your trunk high enough to just clear your shoulder blades. Lifting higher can put excessive stress on the lower back and does not further strengthen your abdominal muscles.   Control your return to the starting position.  Repeat __________ times. Complete this exercise __________ times per day.  STRENGTHENING - Quadruped, Opposite UE/LE Lift   Assume a hands and knees position on a firm surface. Keep your hands under your shoulders and your knees under your hips. You may place padding under your knees for comfort.   Find your neutral spine and gently tense your abdominal muscles so that you can maintain this position. Your shoulders and hips should form a rectangle that is parallel with the floor and is not twisted.   Keeping your trunk steady, lift your right hand no higher than your shoulder and then your left leg no higher than your hip. Make sure you are not holding your  breath. Hold this position for __________ seconds.   Continuing to keep your abdominal muscles tense and your back steady, slowly return to your starting position. Repeat with the opposite arm and leg.  Repeat __________ times. Complete this exercise __________ times   per day.  STRENGTHENING - Abdominals and Quadriceps, Straight Leg Raise   Lie on a firm bed or floor with both legs extended in front of you.   Keeping one leg in contact with the floor, bend the other knee so that your foot can rest flat on the floor.   Find your neutral spine, and tense your abdominal muscles to maintain your spinal position throughout the exercise.   Slowly lift your straight leg off the floor about 6 inches for a count of 15, making sure to not hold your breath.   Still keeping your neutral spine, slowly lower your leg all the way to the floor.  Repeat this exercise with each leg __________ times. Complete this exercise __________ times per day. POSTURE AND BODY MECHANICS CONSIDERATIONS - Low Back Sprain Keeping correct posture when sitting, standing or completing your activities will reduce the stress put on different body tissues, allowing injured tissues a chance to heal and limiting painful experiences. The following are general guidelines for improved posture. Your physician or physical therapist will provide you with any instructions specific to your needs. While reading these guidelines, remember:  The exercises prescribed by your provider will help you have the flexibility and strength to maintain correct postures.   The correct posture provides the best environment for your joints to work. All of your joints have less wear and tear when properly supported by a spine with good posture. This means you will experience a healthier, less painful body.   Correct posture must be practiced with all of your activities, especially prolonged sitting and standing. Correct posture is as important when doing  repetitive low-stress activities (typing) as it is when doing a single heavy-load activity (lifting).  RESTING POSITIONS Consider which positions are most painful for you when choosing a resting position. If you have pain with flexion-based activities (sitting, bending, stooping, squatting), choose a position that allows you to rest in a less flexed posture. You would want to avoid curling into a fetal position on your side. If your pain worsens with extension-based activities (prolonged standing, working overhead), avoid resting in an extended position such as sleeping on your stomach. Most people will find more comfort when they rest with their spine in a more neutral position, neither too rounded nor too arched. Lying on a non-sagging bed on your side with a pillow between your knees, or on your back with a pillow under your knees will often provide some relief. Keep in mind, being in any one position for a prolonged period of time, no matter how correct your posture, can still lead to stiffness. PROPER SITTING POSTURE In order to minimize stress and discomfort on your spine, you must sit with correct posture. Sitting with good posture should be effortless for a healthy body. Returning to good posture is a gradual process. Many people can work toward this most comfortably by using various supports until they have the flexibility and strength to maintain this posture on their own. When sitting with proper posture, your ears will fall over your shoulders and your shoulders will fall over your hips. You should use the back of the chair to support your upper back. Your lower back will be in a neutral position, just slightly arched. You may place a small pillow or folded towel at the base of your lower back for  support.  When working at a desk, create an environment that supports good, upright posture. Without extra support, muscles tire, which leads to excessive   strain on joints and other tissues. Keep these  recommendations in mind: CHAIR:  A chair should be able to slide under your desk when your back makes contact with the back of the chair. This allows you to work closely.   The chair's height should allow your eyes to be level with the upper part of your monitor and your hands to be slightly lower than your elbows.  BODY POSITION  Your feet should make contact with the floor. If this is not possible, use a foot rest.   Keep your ears over your shoulders. This will reduce stress on your neck and low back.  INCORRECT SITTING POSTURES  If you are feeling tired and unable to assume a healthy sitting posture, do not slouch or slump. This puts excessive strain on your back tissues, causing more damage and pain. Healthier options include:  Using more support, like a lumbar pillow.   Switching tasks to something that requires you to be upright or walking.   Talking a brief walk.   Lying down to rest in a neutral-spine position.  PROLONGED STANDING WHILE SLIGHTLY LEANING FORWARD  When completing a task that requires you to lean forward while standing in one place for a long time, place either foot up on a stationary 2-4 inch high object to help maintain the best posture. When both feet are on the ground, the lower back tends to lose its slight inward curve. If this curve flattens (or becomes too large), then the back and your other joints will experience too much stress, tire more quickly, and can cause pain. CORRECT STANDING POSTURES Proper standing posture should be assumed with all daily activities, even if they only take a few moments, like when brushing your teeth. As in sitting, your ears should fall over your shoulders and your shoulders should fall over your hips. You should keep a slight tension in your abdominal muscles to brace your spine. Your tailbone should point down to the ground, not behind your body, resulting in an over-extended swayback posture.  INCORRECT STANDING POSTURES    Common incorrect standing postures include a forward head, locked knees and/or an excessive swayback. WALKING Walk with an upright posture. Your ears, shoulders and hips should all line-up. PROLONGED ACTIVITY IN A FLEXED POSITION When completing a task that requires you to bend forward at your waist or lean over a low surface, try to find a way to stabilize 3 out of 4 of your limbs. You can place a hand or elbow on your thigh or rest a knee on the surface you are reaching across. This will provide you more stability, so that your muscles do not tire as quickly. By keeping your knees relaxed, or slightly bent, you will also reduce stress across your lower back. CORRECT LIFTING TECHNIQUES DO :  Assume a wide stance. This will provide you more stability and the opportunity to get as close as possible to the object which you are lifting.   Tense your abdominals to brace your spine. Bend at the knees and hips. Keeping your back locked in a neutral-spine position, lift using your leg muscles. Lift with your legs, keeping your back straight.   Test the weight of unknown objects before attempting to lift them.   Try to keep your elbows locked down at your sides in order get the best strength from your shoulders when carrying an object.   Always ask for help when lifting heavy or awkward objects.  INCORRECT LIFTING TECHNIQUES DO   NOT:   Lock your knees when lifting, even if it is a small object.   Bend and twist. Pivot at your feet or move your feet when needing to change directions.   Assume that you can safely pick up even a paperclip without proper posture.  Document Released: 09/14/2005 Document Revised: 05/27/2011 Document Reviewed: 12/27/2008 [employer] Patient Information 2012 [employer] 

## 2011-10-09 ENCOUNTER — Other Ambulatory Visit: Payer: Self-pay | Admitting: Family Medicine

## 2011-10-14 ENCOUNTER — Other Ambulatory Visit: Payer: Self-pay | Admitting: Family Medicine

## 2011-11-12 ENCOUNTER — Other Ambulatory Visit: Payer: Self-pay | Admitting: Family Medicine

## 2011-12-14 ENCOUNTER — Other Ambulatory Visit: Payer: Self-pay | Admitting: Family Medicine

## 2011-12-18 ENCOUNTER — Other Ambulatory Visit: Payer: Self-pay | Admitting: Family Medicine

## 2012-01-18 ENCOUNTER — Other Ambulatory Visit: Payer: Self-pay | Admitting: Family Medicine

## 2012-01-19 ENCOUNTER — Other Ambulatory Visit: Payer: Self-pay | Admitting: Family Medicine

## 2012-02-12 ENCOUNTER — Other Ambulatory Visit: Payer: Self-pay | Admitting: Family Medicine

## 2012-02-18 ENCOUNTER — Other Ambulatory Visit: Payer: Self-pay | Admitting: Family Medicine

## 2012-02-20 ENCOUNTER — Other Ambulatory Visit: Payer: Self-pay | Admitting: Family Medicine

## 2012-03-03 ENCOUNTER — Encounter: Payer: Self-pay | Admitting: Family Medicine

## 2012-03-03 ENCOUNTER — Ambulatory Visit (INDEPENDENT_AMBULATORY_CARE_PROVIDER_SITE_OTHER): Payer: Medicare Other | Admitting: Family Medicine

## 2012-03-03 VITALS — BP 135/86 | HR 101 | Ht 65.0 in | Wt 181.0 lb

## 2012-03-03 DIAGNOSIS — Z9181 History of falling: Secondary | ICD-10-CM

## 2012-03-03 DIAGNOSIS — M949 Disorder of cartilage, unspecified: Secondary | ICD-10-CM

## 2012-03-03 DIAGNOSIS — M8580 Other specified disorders of bone density and structure, unspecified site: Secondary | ICD-10-CM

## 2012-03-03 DIAGNOSIS — I10 Essential (primary) hypertension: Secondary | ICD-10-CM

## 2012-03-03 DIAGNOSIS — Z1331 Encounter for screening for depression: Secondary | ICD-10-CM

## 2012-03-03 DIAGNOSIS — E669 Obesity, unspecified: Secondary | ICD-10-CM

## 2012-03-03 DIAGNOSIS — E785 Hyperlipidemia, unspecified: Secondary | ICD-10-CM

## 2012-03-03 LAB — COMPLETE METABOLIC PANEL WITH GFR
ALT: 8 U/L (ref 0–35)
AST: 13 U/L (ref 0–37)
Albumin: 4.1 g/dL (ref 3.5–5.2)
CO2: 26 mEq/L (ref 19–32)
Calcium: 11 mg/dL — ABNORMAL HIGH (ref 8.4–10.5)
Chloride: 99 mEq/L (ref 96–112)
Creat: 1.12 mg/dL — ABNORMAL HIGH (ref 0.50–1.10)
GFR, Est African American: 56 mL/min — ABNORMAL LOW
Potassium: 4.6 mEq/L (ref 3.5–5.3)
Sodium: 138 mEq/L (ref 135–145)
Total Protein: 8.4 g/dL — ABNORMAL HIGH (ref 6.0–8.3)

## 2012-03-03 LAB — LIPID PANEL
LDL Cholesterol: 170 mg/dL — ABNORMAL HIGH (ref 0–99)
VLDL: 23 mg/dL (ref 0–40)

## 2012-03-03 NOTE — Patient Instructions (Signed)
Osteoporosis  Osteoporosis is a disease of the bones that makes them weaker and prone to break (fracture). By their mid-30s, most people begin to gradually lose bone strength. If this is severe enough, osteoporosis may occur. Osteopenia is a less severe weakness of the bones, which places you at risk for osteoporosis. It is important to identify if you have osteoporosis or osteopenia. Bone fractures from osteoporosis (especially hip and spine fractures) are a major cause of hospitalization, loss of independence, and can lead to life-threatening complications.  CAUSES    There are a number of causes and risk factors:   Gender. Women are at a higher risk for osteoporosis than men.    Age. Bone formation slows down with age.    Ethnicity. For unclear reasons, white and Asian women are at higher risk for osteoporosis. Hispanic and African American women are at increased, but lesser, risk.    Family history of osteoporosis can mean that you are at a higher risk for getting it.    History of bone fractures indicates you may be at higher risk of another.    Calcium is very important for bone health and strength. Not enough calcium in your diet increases your risk for osteoporosis. Vitamin D is important for calcium metabolism. You get vitamin D from sunlight, foods, or supplements.    Physical activity. Bones get stronger with weight-bearing exercise and weaker without use.    Smoking is associated with decreased bone strength.    Medicines. Cortisone medicines, too much thyroid medicine, some cancer and seizure medicines, and others can weaken bones and cause osteoporosis.    Decreased body weight is associated with osteoporosis. The small amount of estrogen-type molecules produced in fat cells seems to protect the bones.    Menopausal decrease in the hormone estrogen can cause osteoporosis.    Low levels of the hormone testosterone can cause osteoporosis.     Some medical conditions can lead to osteoporosis (hyperthyroidism, hyperparathyroidism, B12 deficiency).   SYMPTOMS    Usually, no symptoms are felt as the bones weaken. The first symptoms are generally related to bone fractures. You may have silent, tiny bone fractures, especially in your spine. This can cause height loss and forward bending of the spine (kyphosis).  DIAGNOSIS    You or your caregiver may suspect osteoporosis based on height loss and kyphosis. Osteoporosis or osteopenia may be identified on an X-ray done for other reasons. A bone density measurement will likely be taken. Your bones are often measured at your lower spine or your hips. Measurement is done by an X-ray called a DEXA scan, or sometimes by a computerized X-ray scan (CT or CAT scan). Other tests may be done to find the cause of osteoporosis, such as blood tests to measure calcium and vitamin D, or to monitor treatment.  TREATMENT    The goal of osteoporosis treatment is to prevent fractures. This is done through medicine and home care treatments. Treatment will slow the weakening of your bones and strengthen them where possible. Measures to decrease the likelihood of falling and fracturing a bone are also important.  Medicine   You may need supplements if you are not getting enough calcium, vitamin D, and vitamin B12.    If you are female and menopausal, you should discuss the option of estrogen replacement or estrogen-like medicine with your caregiver.    Medicines can be taken by mouth or injection to help build bone strength. When taken by mouth, there are important directions   that you need to follow.    Calcitonin is a hormone made by the thyroid gland that can help build bone strength and decrease fracture risk in the spine. It can be taken by nasal spray or injection.    Parathyroid hormone can be injected to help build bone strength.    You will need to continue to get enough calcium intake with any of these medicines.    FALL PREVENTION   If you are unsteady on your feet, use a cane, walker, or walk with someone's help.    Remove loose rugs or electrical cords from your home.    Keep your home well lit at night. Use glasses if you need them.    Avoid icy streets and wet or waxed floors.    Hold the railing when using stairs.    Watch out for your pets.    Install grab bars in your bathroom.    Exercise. Physical activity, especially weight-bearing exercise, helps strengthen bones. Strength and balance exercise, such as tai chi, helps prevent falls.    Alcohol and some medicines can make you more likely to fall. Discuss alcohol use with your caregiver. Ask your caregiver if any of your medicines might increase your risk for falling. Ask if safer alternatives are available.   HOME CARE INSTRUCTIONS     Try to prevent and avoid falls.    To pick up objects, bend at the knees. Do not bend with your back.    Do not smoke. If you smoke, ask for help to stop.    Have adequate calcium and vitamin D in your diet. Talk with your caregiver about amounts.    Before exercising, ask your caregiver what exercises will be good for you.    Only take over-the-counter or prescription medicines for pain, discomfort, or fever as directed by your caregiver.   SEEK MEDICAL CARE IF:     You have had a fracture and your pain is not controlled.    You have had a fracture and you are not able to return to activities as expected.    You are reinjured.    You develop side effects from medicines, especially stomach pain or trouble swallowing.    You develop new, unexplained problems.   SEEK IMMEDIATE MEDICAL CARE IF:     You develop sudden, severe pain in your back.    You develop pain after an injury or fall.   Document Released: 06/24/2005 Document Revised: 09/03/2011 Document Reviewed: 08/29/2011  [employer] Patient Information 2012 [employer].

## 2012-03-03 NOTE — Progress Notes (Signed)
  Subjective:    Patient ID: [name], female    DOB: 1936-03-27, 76 y.o.   MRN: [id number]  HPI HTN- no CP os SOB.  Taking her meds without side effects.  She is not getting any regular exercise right now. She is taking her medication consistently. She does have a concern because the pill does look different than it used to.  Says she had a cracked rib after turning over in bed.  Went to [facility] hospital.  Xray showed cracked rib.  She does have a history of osteopenia. Her last DEXA scan was in February 2010.   Review of Systems     Objective:   Physical Exam  Constitutional: She is oriented to person, place, and time. She appears well-developed and well-nourished.  HENT:  Head: Normocephalic and atraumatic.  Cardiovascular: Normal rate, regular rhythm and normal heart sounds.   Pulmonary/Chest: Effort normal and breath sounds normal.  Neurological: She is alert and oriented to person, place, and time.  Skin: Skin is warm and dry.  Psychiatric: She has a normal mood and affect. Her behavior is normal.          Assessment & Plan:  HTN - well controlled. Continue current regimen.  F/U in 6 months. Encouraged more regular exercise.   Hyperlpidiemai  -= Recheck cholesterol today.    Fall Assessment - score of 2.  Low risk for falls.   Depression - PHQ- 9 score of 6.  Hx of dperssion. Ell controlled right now.   Osteopenia-has been 2 years since her last bone density test. We'll get this scheduled for her. Check a vitamin D today.

## 2012-03-04 ENCOUNTER — Other Ambulatory Visit: Payer: Self-pay | Admitting: Family Medicine

## 2012-03-04 LAB — VITAMIN D 25 HYDROXY (VIT D DEFICIENCY, FRACTURES): Vit D, 25-Hydroxy: 37 ng/mL (ref 30–89)

## 2012-03-04 MED ORDER — ROSUVASTATIN CALCIUM 10 MG PO TABS: 10.0000 mg | ORAL_TABLET | Freq: Every day | ORAL | Status: DC

## 2012-03-14 ENCOUNTER — Telehealth: Payer: Self-pay | Admitting: *Deleted

## 2012-03-14 DIAGNOSIS — R899 Unspecified abnormal finding in specimens from other organs, systems and tissues: Secondary | ICD-10-CM

## 2012-03-15 ENCOUNTER — Ambulatory Visit
Admission: RE | Admit: 2012-03-15 | Discharge: 2012-03-15 | Disposition: A | Discharge status: Home / Self Care | Payer: Medicare Other | Source: Ambulatory Visit | Attending: Family Medicine | Admitting: Family Medicine

## 2012-03-15 DIAGNOSIS — M8580 Other specified disorders of bone density and structure, unspecified site: Secondary | ICD-10-CM

## 2012-03-16 LAB — COMPLETE METABOLIC PANEL WITH GFR
ALT: 12 U/L (ref 0–35)
Alkaline Phosphatase: 60 U/L (ref 39–117)
CO2: 27 mEq/L (ref 19–32)
GFR, Est African American: 53 mL/min — ABNORMAL LOW
Potassium: 4.6 mEq/L (ref 3.5–5.3)
Sodium: 139 mEq/L (ref 135–145)
Total Bilirubin: 0.2 mg/dL — ABNORMAL LOW (ref 0.3–1.2)
Total Protein: 8.3 g/dL (ref 6.0–8.3)

## 2012-03-17 ENCOUNTER — Telehealth: Payer: Self-pay | Admitting: *Deleted

## 2012-03-17 ENCOUNTER — Other Ambulatory Visit: Payer: Self-pay | Admitting: Family Medicine

## 2012-03-17 DIAGNOSIS — M545 Low back pain: Secondary | ICD-10-CM

## 2012-03-17 DIAGNOSIS — R6889 Other general symptoms and signs: Secondary | ICD-10-CM

## 2012-03-17 NOTE — Telephone Encounter (Signed)
Ok for referral. Can you place for me? Let me know if problems.

## 2012-03-17 NOTE — Telephone Encounter (Signed)
Orders sent to lab/

## 2012-03-17 NOTE — Telephone Encounter (Signed)
Pt calls and would like a referral to ortho for her lower back pain. Would like to see Dr. Jordan Likes in [place].when fax make attn: Thayer Ohm in his office

## 2012-03-17 NOTE — Telephone Encounter (Signed)
Referral put in to Ortho- Dr. Jordan Likes. KG LPN

## 2012-03-19 ENCOUNTER — Emergency Department (INDEPENDENT_AMBULATORY_CARE_PROVIDER_SITE_OTHER)
Admission: EM | Admit: 2012-03-19 | Discharge: 2012-03-19 | Disposition: A | Discharge status: Home / Self Care | Payer: Medicare Other | Source: Home / Self Care | Attending: Family Medicine | Admitting: Family Medicine

## 2012-03-19 ENCOUNTER — Encounter: Payer: Self-pay | Admitting: Emergency Medicine

## 2012-03-19 ENCOUNTER — Emergency Department: Admit: 2012-03-19 | Discharge: 2012-03-19 | Disposition: A | Discharge status: Home / Self Care | Payer: Medicare Other

## 2012-03-19 DIAGNOSIS — M47817 Spondylosis without myelopathy or radiculopathy, lumbosacral region: Secondary | ICD-10-CM

## 2012-03-19 DIAGNOSIS — M533 Sacrococcygeal disorders, not elsewhere classified: Secondary | ICD-10-CM

## 2012-03-19 DIAGNOSIS — M5137 Other intervertebral disc degeneration, lumbosacral region: Secondary | ICD-10-CM

## 2012-03-19 DIAGNOSIS — M76899 Other specified enthesopathies of unspecified lower limb, excluding foot: Secondary | ICD-10-CM

## 2012-03-19 DIAGNOSIS — M7061 Trochanteric bursitis, right hip: Secondary | ICD-10-CM

## 2012-03-19 DIAGNOSIS — M47816 Spondylosis without myelopathy or radiculopathy, lumbar region: Secondary | ICD-10-CM

## 2012-03-19 MED ORDER — TRAMADOL HCL 50 MG PO TABS: ORAL_TABLET | ORAL | Status: DC

## 2012-03-19 NOTE — Discharge Instructions (Signed)
Apply ice pack to hip areas two or three times daily.  Begin exercises as per instruction sheets.  Hip Bursitis Bursitis is a puffiness (swelling) and soreness of a fluid-filled sac (bursa). This sac covers and protects the joint. HOME CARE  Put ice on the injured area.   Put ice in a plastic bag.   Place a towel between your skin and the bag.   Leave the ice on for 15 to 20 minutes, 3 to 4 times a day.   Rest the painful joint as much as possible. Move your joint at least 4 times a day. When pain lessens, start normal, slow movements and normal activities.   Only take medicine as told by your doctor.   Use crutches as told.   Raise (elevate) your painful joint. Use pillows for propping your legs and hips.   Get a massage to lessen pain.  GET HELP RIGHT AWAY IF:  Your pain increases or does not improve during treatment.   You have a fever.   You feel heat coming from the affected area.   You see redness and puffiness around the affected area.   You have any questions or concerns.  MAKE SURE YOU:  Understand these instructions.   Will watch your condition.   Will get help right away if you are not well or get worse.  Document Released: 10/17/2010 Document Revised: 09/03/2011 Document Reviewed: 10/17/2010 [facility] Patient Information 2012 [place], [place].

## 2012-03-19 NOTE — ED Provider Notes (Signed)
History     CSN: [id number]  Arrival date & time 03/19/12  0919   First MD Initiated Contact with Patient 03/19/12 [phone]      Chief Complaint  Patient presents with  . Back Pain      HPI Comments: Patient complains of 6 month history of gradually increasing bilateral low back pain that radiates into both hips.  She was prescribed Flexeril in January this year, which provided slight improvement at night, and back exercises, but patient notes that her low back pain continues to increase.  She had been walking, but that too is painful.  She is now applying OTC arthritis creams (Icy Hot, etc), also not very helpful.  She has pain at night, when changing position in bed.  She has morning stiffness which improves somewhat later in day.  She has pain when arising from a chair.  No recent trauma.  No bowel or bladder dysfunction.  No saddle numbness.   Patient is a 76 y.o. female presenting with back pain. The history is provided by the patient.  Back Pain  Episode onset: 6 months ago. The problem occurs daily. The problem has been gradually worsening. The pain is associated with no known injury. The pain is present in the sacro-iliac joint, lumbar spine and gluteal region. The quality of the pain is described as aching. Radiates to: bilateral hips. The pain is at a severity of 8/10. The pain is moderate. The symptoms are aggravated by bending and certain positions. The pain is worse during the night. Stiffness is present in the morning. Pertinent negatives include no fever, no numbness, no weight loss, no abdominal pain, no bowel incontinence, no perianal numbness, no bladder incontinence, no dysuria, no pelvic pain, no leg pain, no paresthesias, no paresis, no tingling and no weakness. She has tried analgesics and muscle relaxants for the symptoms. The treatment provided mild relief. Risk factors include obesity, menopause and a history of osteoporosis.    Past Medical History  Diagnosis Date  .  Anxiety   . Hypertension     Past Surgical History  Procedure Date  . Appendectomy   . Breast lumpectomy     Family History  Problem Relation Age of Onset  . Heart disease Mother   . Hyperlipidemia Sister   . Hypertension Sister   . Diabetes Sister   . Diabetes Sister   . Hyperlipidemia Sister   . Hypertension Sister     History  Substance Use Topics  . Smoking status: Never Smoker   . Smokeless tobacco: Not on file  . Alcohol Use: No    OB History    Grav Para Term Preterm Abortions TAB SAB Ect Mult Living                  Review of Systems  Constitutional: Negative for fever and weight loss.  Gastrointestinal: Negative for abdominal pain and bowel incontinence.  Genitourinary: Negative for bladder incontinence, dysuria and pelvic pain.  Musculoskeletal: Positive for back pain.  Neurological: Negative for tingling, weakness, numbness and paresthesias.  All other systems reviewed and are negative.    Allergies  Simvastatin  Home Medications   Current Outpatient Rx  Name Route Sig Dispense Refill  . ALPRAZOLAM 1 MG PO TABS  TAKE 1 TO 2 TABLETS DAILY AS NEEDED 60 tablet 0  . BUTALBITAL-ASA-CAFFEINE 50-325-40 MG PO CAPS  TAKE 1 TO 2 CAPSULES UP TO ONCE A DAY AS NEEDED 60 capsule 0  . OMEGA-3 FATTY ACIDS 1000  MG PO CAPS Oral Take by mouth daily. Take 4 daily     . ROSUVASTATIN CALCIUM 10 MG PO TABS Oral Take 1 tablet (10 mg total) by mouth at bedtime. 30 tablet 2  . SEROQUEL 100 MG PO TABS  TAKE 1 TABLET DAILY 30 each 2  . TRAMADOL HCL 50 MG PO TABS  Take one-half to one tab by mouth at bedtime as needed for pain 15 tablet 0  . TRIBENZOR 40-10-12.5 MG PO TABS  TAKE 1 TABLET DAILY 30 tablet 0    Needs appoinment    BP 153/86  Pulse 102  Temp 98 F (36.7 C) (Oral)  Resp 18  Ht 5\' 7"  (1.702 m)  Wt 181 lb (82.101 kg)  BMI 28.35 kg/m2  SpO2 99%  Physical Exam Nursing notes and Vital Signs reviewed. Appearance:  Patient appears stated age, and in no  acute distress.  Patient is overweight (BMI 28.4) Eyes:  Pupils are equal, round, and reactive to light and accomodation.  Extraocular movement is intact.  Conjunctivae are not inflamed  Pharynx:  Normal Neck:  Supple.  No adenopathy Lungs:  Clear to auscultation.  Breath sounds are equal.  Heart:  Regular rate and rhythm without murmurs, rubs, or gallops.  Abdomen:  Nontender without masses or hepatosplenomegaly.  Bowel sounds are present.  No CVA or flank tenderness.  Extremities:  No edema.  No calf tenderness.  Right leg is approximately one cm longer than left by inspection.  Bilateral hips:  Good range of motion to flexion and extension.  Internal/external rotation intact.  Distinct tenderness over bilateral greater trochanters.  Resisted lateral abduction of the hips while palpating the greater trochanters recreates discomfort.  Back:   Can heel/toe walk and squat without difficulty.  Decreased forward flexion.  Tenderness over bilateral SI joints.  Straight leg raising test is negative.  Sitting knee extension test is negative.  Strength and sensation in the lower extremities is normal.  Patellar and achilles reflexes are normal  Skin:  No rash present.   ED Course  Procedures none   Dg Lumbar Spine 2-3 Views  03/19/2012  *RADIOLOGY REPORT*  Clinical Data: Low back pain  LUMBAR SPINE - 2-3 VIEW  Comparison: 10/05/2008  Findings: Severe narrowing of the L2-3 and L3-4 discs.  Moderate narrowing at L4-5.  Minimal retrolisthesis L3 upon L4 unchanged. Levoscoliosis with the apex at L2-3.  No vertebral compression deformity.  Osteopenia.  Lower lumbar facet arthropathy.  IMPRESSION: Chronic and degenerative changes.  No acute bony pathology.  Original Report Authenticated By: Donavan Burnet, M.D.   Review of chart reveals that a bone density scan 03/16/12 showed osteopenia of lumbar spine and left femur.  Vitamin D level done 03/04/12 within normal range at 37  1. DJD (degenerative joint disease)  of lumbar spine   2. Pain of both sacroiliac joints   3. Trochanteric bursitis of both hips       MDM   Begin trial of Tramadol at bedtime.  [facility] of bilateral hip bursitis.  Begin hip exercises ([facility] information and instruction handout given)  Apply ice pack to hip areas two or three times daily.  Begin exercises as per instruction sheets. Followup with Family Doctor if not improved in 2 to 3 weeks.          Lattie Haw, MD 03/19/12 1213

## 2012-03-19 NOTE — ED Notes (Signed)
Lower (waist level) back pain intermittently since January; worsened recently. No urinary S&S. Does not know if she has arthritis in back. Did fx rib earlier this year by simply rolling over in bed.

## 2012-03-21 ENCOUNTER — Ambulatory Visit (INDEPENDENT_AMBULATORY_CARE_PROVIDER_SITE_OTHER): Payer: Medicare Other | Admitting: Family Medicine

## 2012-03-21 ENCOUNTER — Encounter: Payer: Self-pay | Admitting: Family Medicine

## 2012-03-21 VITALS — BP 129/87 | HR 111 | Temp 97.5°F | Ht 67.0 in | Wt 181.0 lb

## 2012-03-21 DIAGNOSIS — M545 Low back pain, unspecified: Secondary | ICD-10-CM

## 2012-03-21 MED ORDER — MELOXICAM 15 MG PO TABS: 15.0000 mg | ORAL_TABLET | Freq: Every day | ORAL | Status: DC

## 2012-03-21 MED ORDER — PREDNISONE (PAK) 10 MG PO TABS: ORAL_TABLET | ORAL | Status: DC

## 2012-03-21 MED ORDER — CYCLOBENZAPRINE HCL 5 MG PO TABS: 5.0000 mg | ORAL_TABLET | Freq: Three times a day (TID) | ORAL | Status: AC | PRN

## 2012-03-21 NOTE — Patient Instructions (Addendum)
You have fairly severe arthritis in your low back that's likely irritating nerves going to the outside of your hips. Take tylenol for baseline pain relief (1-2 extra strength tabs 3x/day) A prednisone dose pack is the best option for immediate relief and may be prescribed with transition to an anti-inflammatory like meloxicam (if you do not have stomach or kidney issues).  Start the meloxicam AFTER you finished the prednisone. Flexeril as needed for muscle spasms (no driving on this medicine if it makes you sleepy). Stay as active as possible. Physical therapy has been shown to be helpful as well - we will order this for you to start as soon as possible. Strengthening of low back muscles, abdominal musculature are key for long term pain relief. If not improving, will consider further imaging (MRI). Follow up with me in 1-2 weeks for reevaluation.

## 2012-03-23 ENCOUNTER — Encounter: Payer: Self-pay | Admitting: Family Medicine

## 2012-03-23 NOTE — Progress Notes (Signed)
Subjective:    Patient ID: [name], female    DOB: 10-Sep-1936, 76 y.o.   MRN: [id number]  PCP: Dr. Linford Arnold  HPI 76 yo F here for low back pain.  Patient reports back in January started having back pain without injury or inciting event. Went to PCP - given HEP and flexeril which helped some. Pain has persisted though and especially bad when getting out of bed or out of a chair. Has been alternating heat and ice. No radiation. No numbness or tingling. No bowel/bladder dysfunction. Tried advil. Lumbar x-rays showed multilevel DDD especially severe at L2-3 and L3-4 levels. She is requesting a shot for the pain.  Past Medical History  Diagnosis Date  . Anxiety   . Hypertension   . Hyperlipidemia     Current Outpatient Prescriptions on File Prior to Visit  Medication Sig Dispense Refill  . ALPRAZolam (XANAX) 1 MG tablet TAKE 1 TO 2 TABLETS DAILY AS NEEDED  60 tablet  0  . butalbital-aspirin-caffeine (FIORINAL) 50-325-40 MG per capsule TAKE 1 TO 2 CAPSULES UP TO ONCE A DAY AS NEEDED  60 capsule  0  . fish oil-omega-3 fatty acids 1000 MG capsule Take by mouth daily. Take 4 daily       . rosuvastatin (CRESTOR) 10 MG tablet Take 1 tablet (10 mg total) by mouth at bedtime.  30 tablet  2  . SEROQUEL 100 MG tablet TAKE 1 TABLET DAILY  30 each  2  . traMADol (ULTRAM) 50 MG tablet Take one-half to one tab by mouth at bedtime as needed for pain  15 tablet  0  . TRIBENZOR 40-10-12.5 MG TABS TAKE 1 TABLET DAILY  30 tablet  0    Past Surgical History  Procedure Date  . Appendectomy   . Breast lumpectomy     Allergies  Allergen Reactions  . Simvastatin Other (See Comments)    Joint aches     History   Social History  . Marital Status: Married    Spouse Name: N/A    Number of Children: N/A  . Years of Education: N/A   Occupational History  . Not on file.   Social History Main Topics  . Smoking status: Never Smoker   . Smokeless tobacco: Not on file  . Alcohol Use:  No  . Drug Use: No  . Sexually Active: Not on file   Other Topics Concern  . Not on file   Social History Narrative  . No narrative on file    Family History  Problem Relation Age of Onset  . Heart disease Mother   . Hyperlipidemia Sister   . Hypertension Sister   . Diabetes Sister   . Diabetes Sister   . Hyperlipidemia Sister   . Hypertension Sister   . Sudden death Neg Hx   . Heart attack Neg Hx     BP 129/87  Pulse 111  Temp 97.5 F (36.4 C) (Oral)  Ht 5\' 7"  (1.702 m)  Wt 181 lb (82.101 kg)  BMI 28.35 kg/m2  Review of Systems See HPI above.    Objective:   Physical Exam Gen: NAD  Back: No gross deformity, scoliosis. TTP midline and bilateral paraspinal muscles of lumbar region.  No focal bony tenderness.  No trochanteric tenderness, minimal SI joint tenderness. FROM with pain on full flexion and extension. Strength LEs 5/5 all muscle groups.   1+ MSRs in patellar and achilles tendons, equal bilaterally. Negative SLRs. Sensation intact to light touch bilaterally.  Negative logroll bilateral hips Negative fabers and piriformis stretches.   Assessment & Plan:  1. Low back pain - radiographs did not show evidence of malignancy.  Severe DDD likely cause of her pain.  Advised we can try a one time prednisone dose pack for inflammation and pain then transition to meloxicam.  Flexeril as needed for spasms.  Can continue with tylenol.  Start physical therapy as well.  F/u in 1-2 weeks.  If not improving will go ahead with MRI of lumbar spine to further assess.

## 2012-03-23 NOTE — Assessment & Plan Note (Signed)
radiographs did not show evidence of malignancy.  Severe DDD likely cause of her pain.  Advised we can try a one time prednisone dose pack for inflammation and pain then transition to meloxicam.  Flexeril as needed for spasms.  Can continue with tylenol.  Start physical therapy as well.  F/u in 1-2 weeks.  If not improving will go ahead with MRI of lumbar spine to further assess.

## 2012-03-24 ENCOUNTER — Ambulatory Visit: Payer: Medicare Other | Attending: Family Medicine | Admitting: Physical Therapy

## 2012-03-24 DIAGNOSIS — M545 Low back pain, unspecified: Secondary | ICD-10-CM | POA: Insufficient documentation

## 2012-03-24 DIAGNOSIS — IMO0001 Reserved for inherently not codable concepts without codable children: Secondary | ICD-10-CM | POA: Insufficient documentation

## 2012-03-25 ENCOUNTER — Ambulatory Visit: Payer: Medicare Other | Admitting: Physical Therapy

## 2012-03-25 ENCOUNTER — Other Ambulatory Visit: Payer: Self-pay | Admitting: Family Medicine

## 2012-03-25 ENCOUNTER — Other Ambulatory Visit: Payer: Self-pay | Admitting: *Deleted

## 2012-03-25 MED ORDER — ALPRAZOLAM 1 MG PO TABS: 1.0000 mg | ORAL_TABLET | Freq: Every day | ORAL | Status: DC | PRN

## 2012-03-28 ENCOUNTER — Ambulatory Visit: Payer: Medicare Other | Attending: Family Medicine | Admitting: Physical Therapy

## 2012-03-28 DIAGNOSIS — S065XAA Traumatic subdural hemorrhage with loss of consciousness status unknown, initial encounter: Secondary | ICD-10-CM | POA: Insufficient documentation

## 2012-03-28 DIAGNOSIS — M545 Low back pain, unspecified: Secondary | ICD-10-CM | POA: Insufficient documentation

## 2012-03-28 DIAGNOSIS — C9000 Multiple myeloma not having achieved remission: Secondary | ICD-10-CM

## 2012-03-28 DIAGNOSIS — S065X9A Traumatic subdural hemorrhage with loss of consciousness of unspecified duration, initial encounter: Secondary | ICD-10-CM

## 2012-03-28 DIAGNOSIS — IMO0001 Reserved for inherently not codable concepts without codable children: Secondary | ICD-10-CM | POA: Insufficient documentation

## 2012-03-28 HISTORY — DX: Traumatic subdural hemorrhage with loss of consciousness status unknown, initial encounter: S06.5XAA

## 2012-03-28 HISTORY — DX: Traumatic subdural hemorrhage with loss of consciousness of unspecified duration, initial encounter: S06.5X9A

## 2012-03-28 HISTORY — DX: Multiple myeloma not having achieved remission: C90.00

## 2012-03-30 ENCOUNTER — Encounter: Payer: Medicare Other | Admitting: Physical Therapy

## 2012-04-01 ENCOUNTER — Encounter: Payer: Medicare Other | Admitting: Physical Therapy

## 2012-04-03 ENCOUNTER — Encounter (HOSPITAL_COMMUNITY): Payer: Self-pay | Admitting: Emergency Medicine

## 2012-04-03 ENCOUNTER — Inpatient Hospital Stay (HOSPITAL_COMMUNITY)
Admission: EM | Admit: 2012-04-03 | Discharge: 2012-04-12 | DRG: 841 | Disposition: A | Discharge status: Rehabilitation Facility | Payer: Medicare Other | Attending: Internal Medicine | Admitting: Internal Medicine

## 2012-04-03 DIAGNOSIS — E669 Obesity, unspecified: Secondary | ICD-10-CM

## 2012-04-03 DIAGNOSIS — I10 Essential (primary) hypertension: Secondary | ICD-10-CM | POA: Diagnosis present

## 2012-04-03 DIAGNOSIS — C9000 Multiple myeloma not having achieved remission: Secondary | ICD-10-CM | POA: Diagnosis present

## 2012-04-03 DIAGNOSIS — F411 Generalized anxiety disorder: Secondary | ICD-10-CM

## 2012-04-03 DIAGNOSIS — W19XXXA Unspecified fall, initial encounter: Secondary | ICD-10-CM | POA: Diagnosis not present

## 2012-04-03 DIAGNOSIS — S065X9A Traumatic subdural hemorrhage with loss of consciousness of unspecified duration, initial encounter: Secondary | ICD-10-CM | POA: Diagnosis present

## 2012-04-03 DIAGNOSIS — M899 Disorder of bone, unspecified: Secondary | ICD-10-CM

## 2012-04-03 DIAGNOSIS — R5381 Other malaise: Secondary | ICD-10-CM | POA: Diagnosis present

## 2012-04-03 DIAGNOSIS — M549 Dorsalgia, unspecified: Secondary | ICD-10-CM

## 2012-04-03 DIAGNOSIS — G4700 Insomnia, unspecified: Secondary | ICD-10-CM

## 2012-04-03 DIAGNOSIS — M25559 Pain in unspecified hip: Secondary | ICD-10-CM

## 2012-04-03 DIAGNOSIS — Y9210 Unspecified residential institution as the place of occurrence of the external cause: Secondary | ICD-10-CM | POA: Diagnosis not present

## 2012-04-03 DIAGNOSIS — M545 Low back pain, unspecified: Secondary | ICD-10-CM | POA: Diagnosis present

## 2012-04-03 DIAGNOSIS — F329 Major depressive disorder, single episode, unspecified: Secondary | ICD-10-CM

## 2012-04-03 DIAGNOSIS — Z780 Asymptomatic menopausal state: Secondary | ICD-10-CM

## 2012-04-03 DIAGNOSIS — E785 Hyperlipidemia, unspecified: Secondary | ICD-10-CM

## 2012-04-03 DIAGNOSIS — K5900 Constipation, unspecified: Secondary | ICD-10-CM | POA: Diagnosis present

## 2012-04-03 DIAGNOSIS — D649 Anemia, unspecified: Secondary | ICD-10-CM | POA: Diagnosis present

## 2012-04-03 DIAGNOSIS — K5901 Slow transit constipation: Secondary | ICD-10-CM

## 2012-04-03 DIAGNOSIS — S065X0A Traumatic subdural hemorrhage without loss of consciousness, initial encounter: Secondary | ICD-10-CM | POA: Diagnosis not present

## 2012-04-03 DIAGNOSIS — E860 Dehydration: Secondary | ICD-10-CM

## 2012-04-03 DIAGNOSIS — R7989 Other specified abnormal findings of blood chemistry: Secondary | ICD-10-CM

## 2012-04-03 DIAGNOSIS — C9030 Solitary plasmacytoma not having achieved remission: Principal | ICD-10-CM | POA: Diagnosis present

## 2012-04-03 DIAGNOSIS — N63 Unspecified lump in unspecified breast: Secondary | ICD-10-CM

## 2012-04-03 DIAGNOSIS — N179 Acute kidney failure, unspecified: Secondary | ICD-10-CM | POA: Diagnosis present

## 2012-04-03 HISTORY — DX: Depression, unspecified: F32.A

## 2012-04-03 HISTORY — DX: Major depressive disorder, single episode, unspecified: F32.9

## 2012-04-03 HISTORY — DX: Other specified disorders of bone density and structure, unspecified site: M85.80

## 2012-04-03 HISTORY — DX: Multiple myeloma not having achieved remission: C90.00

## 2012-04-03 LAB — URINALYSIS, ROUTINE W REFLEX MICROSCOPIC
Hgb urine dipstick: NEGATIVE
Leukocytes, UA: NEGATIVE
Nitrite: NEGATIVE
Protein, ur: 100 mg/dL — AB
Specific Gravity, Urine: 1.016 (ref 1.005–1.030)
Urobilinogen, UA: 0.2 mg/dL (ref 0.0–1.0)

## 2012-04-03 LAB — CBC WITH DIFFERENTIAL/PLATELET
Basophils Relative: 1 % (ref 0–1)
Eosinophils Absolute: 0.3 10*3/uL (ref 0.0–0.7)
Eosinophils Relative: 3 % (ref 0–5)
Hemoglobin: 10.8 g/dL — ABNORMAL LOW (ref 12.0–15.0)
Lymphocytes Relative: 26 % (ref 12–46)
MCHC: 34.4 g/dL (ref 30.0–36.0)
Neutrophils Relative %: 62 % (ref 43–77)
Platelets: 203 10*3/uL (ref 150–400)
RBC: 3.42 MIL/uL — ABNORMAL LOW (ref 3.87–5.11)
WBC Morphology: INCREASED

## 2012-04-03 LAB — URINE MICROSCOPIC-ADD ON

## 2012-04-03 MED ORDER — SODIUM CHLORIDE 0.9 % IV BOLUS (SEPSIS)
250.0000 mL | Freq: Once | INTRAVENOUS | Status: AC
  Administered 2012-04-03: 250 mL via INTRAVENOUS

## 2012-04-03 MED ORDER — ONDANSETRON HCL 4 MG/2ML IJ SOLN
4.0000 mg | Freq: Once | INTRAMUSCULAR | Status: AC
  Administered 2012-04-03: 4 mg via INTRAVENOUS
  Filled 2012-04-03: qty 2

## 2012-04-03 MED ORDER — SODIUM CHLORIDE 0.9 % IV SOLN
INTRAVENOUS | Status: DC
  Administered 2012-04-03: 23:00:00 via INTRAVENOUS

## 2012-04-03 NOTE — ED Notes (Addendum)
Per EMS, pt called EMS c/o weakness in her legs and back ( bilateral flank) pain. Says she cant stand up bc she is too weak.

## 2012-04-03 NOTE — ED Notes (Signed)
Pt states that her sides "were hurting" and she couldn't walk so her husband called EMS.  Pt states that the pain and weakness has been going on for about 2 weeks. Pt denies any injury or trauma. Pt states she has had some nausea but denies vomiting or diarrhea. Pt denies any chest or abdominal pain.

## 2012-04-04 ENCOUNTER — Inpatient Hospital Stay (HOSPITAL_COMMUNITY): Payer: Medicare Other

## 2012-04-04 ENCOUNTER — Encounter (HOSPITAL_COMMUNITY): Payer: Self-pay | Admitting: Internal Medicine

## 2012-04-04 ENCOUNTER — Encounter: Payer: Medicare Other | Admitting: Physical Therapy

## 2012-04-04 ENCOUNTER — Ambulatory Visit: Payer: Medicare Other | Admitting: Family Medicine

## 2012-04-04 DIAGNOSIS — R7989 Other specified abnormal findings of blood chemistry: Secondary | ICD-10-CM

## 2012-04-04 DIAGNOSIS — M549 Dorsalgia, unspecified: Secondary | ICD-10-CM

## 2012-04-04 DIAGNOSIS — M25559 Pain in unspecified hip: Secondary | ICD-10-CM | POA: Diagnosis present

## 2012-04-04 DIAGNOSIS — N179 Acute kidney failure, unspecified: Secondary | ICD-10-CM | POA: Diagnosis present

## 2012-04-04 DIAGNOSIS — E860 Dehydration: Secondary | ICD-10-CM | POA: Diagnosis present

## 2012-04-04 LAB — PHOSPHORUS: Phosphorus: 5.1 mg/dL — ABNORMAL HIGH (ref 2.3–4.6)

## 2012-04-04 LAB — BASIC METABOLIC PANEL
Calcium: 14.7 mg/dL (ref 8.4–10.5)
GFR calc non Af Amer: 18 mL/min — ABNORMAL LOW (ref >90)
Glucose, Bld: 112 mg/dL — ABNORMAL HIGH (ref 70–99)
Potassium: 4.9 mEq/L (ref 3.5–5.1)
Sodium: 135 mEq/L (ref 135–145)

## 2012-04-04 LAB — IRON AND TIBC
Iron: 67 ug/dL (ref 42–135)
Saturation Ratios: 26 % (ref 20–55)
TIBC: 262 ug/dL (ref 250–470)
UIBC: 195 ug/dL (ref 125–400)

## 2012-04-04 LAB — RETICULOCYTES
RBC.: 3.42 MIL/uL — ABNORMAL LOW (ref 3.87–5.11)
Retic Count, Absolute: 27.4 K/uL (ref 19.0–186.0)
Retic Ct Pct: 0.8 % (ref 0.4–3.1)

## 2012-04-04 LAB — COMPREHENSIVE METABOLIC PANEL
BUN: 29 mg/dL — ABNORMAL HIGH (ref 6–23)
Calcium: 14 mg/dL (ref 8.4–10.5)
GFR calc Af Amer: 22 mL/min — ABNORMAL LOW (ref >90)
Glucose, Bld: 115 mg/dL — ABNORMAL HIGH (ref 70–99)
Total Protein: 9.7 g/dL — ABNORMAL HIGH (ref 6.0–8.3)

## 2012-04-04 LAB — VITAMIN B12: Vitamin B-12: 1696 pg/mL — ABNORMAL HIGH (ref 211–911)

## 2012-04-04 LAB — CBC
HCT: 31.6 % — ABNORMAL LOW (ref 36.0–46.0)
Hemoglobin: 10.8 g/dL — ABNORMAL LOW (ref 12.0–15.0)
MCH: 31.6 pg (ref 26.0–34.0)
MCHC: 34.2 g/dL (ref 30.0–36.0)
MCV: 92.4 fL (ref 78.0–100.0)
Platelets: 185 K/uL (ref 150–400)
RBC: 3.42 MIL/uL — ABNORMAL LOW (ref 3.87–5.11)
RDW: 12.9 % (ref 11.5–15.5)
WBC: 7.4 K/uL (ref 4.0–10.5)

## 2012-04-04 LAB — SODIUM, URINE, RANDOM: Sodium, Ur: 132 mEq/L

## 2012-04-04 LAB — VITAMIN D 25 HYDROXY (VIT D DEFICIENCY, FRACTURES): Vit D, 25-Hydroxy: 29 ng/mL — ABNORMAL LOW (ref 30–89)

## 2012-04-04 LAB — FERRITIN: Ferritin: 50 ng/mL (ref 10–291)

## 2012-04-04 LAB — MAGNESIUM: Magnesium: 2.5 mg/dL (ref 1.5–2.5)

## 2012-04-04 MED ORDER — SODIUM CHLORIDE 0.9 % IV SOLN
INTRAVENOUS | Status: DC
  Administered 2012-04-05 – 2012-04-07 (×6): via INTRAVENOUS
  Administered 2012-04-08: 50 mL/h via INTRAVENOUS
  Administered 2012-04-09 – 2012-04-11 (×4): via INTRAVENOUS

## 2012-04-04 MED ORDER — SODIUM CHLORIDE 0.9 % IV SOLN
INTRAVENOUS | Status: AC
  Administered 2012-04-04: 10:00:00 via INTRAVENOUS

## 2012-04-04 MED ORDER — ALPRAZOLAM 0.5 MG PO TABS
1.0000 mg | ORAL_TABLET | Freq: Every evening | ORAL | Status: DC | PRN
Start: 2012-04-04 — End: 2012-04-12
  Administered 2012-04-04 – 2012-04-08 (×5): 1 mg via ORAL
  Filled 2012-04-04: qty 2
  Filled 2012-04-04 (×3): qty 1
  Filled 2012-04-04: qty 2

## 2012-04-04 MED ORDER — SODIUM CHLORIDE 0.9 % IV SOLN
INTRAVENOUS | Status: DC
  Administered 2012-04-04: 01:00:00 via INTRAVENOUS

## 2012-04-04 MED ORDER — ALBUTEROL SULFATE (5 MG/ML) 0.5% IN NEBU: 2.5000 mg | INHALATION_SOLUTION | RESPIRATORY_TRACT | Status: DC | PRN

## 2012-04-04 MED ORDER — ACETAMINOPHEN 650 MG RE SUPP: 650.0000 mg | Freq: Four times a day (QID) | RECTAL | Status: DC | PRN

## 2012-04-04 MED ORDER — OXYCODONE-ACETAMINOPHEN 7.5-325 MG PO TABS: 1.0000 | ORAL_TABLET | ORAL | Status: DC | PRN

## 2012-04-04 MED ORDER — ATORVASTATIN CALCIUM 20 MG PO TABS
20.0000 mg | ORAL_TABLET | Freq: Every day | ORAL | Status: DC
  Administered 2012-04-04 – 2012-04-11 (×7): 20 mg via ORAL
  Filled 2012-04-04 (×10): qty 1

## 2012-04-04 MED ORDER — PREDNISONE 20 MG PO TABS
20.0000 mg | ORAL_TABLET | Freq: Every day | ORAL | Status: DC
  Administered 2012-04-04 – 2012-04-12 (×8): 20 mg via ORAL
  Filled 2012-04-04 (×13): qty 1

## 2012-04-04 MED ORDER — ONDANSETRON HCL 4 MG/2ML IJ SOLN: 4.0000 mg | Freq: Three times a day (TID) | INTRAMUSCULAR | Status: DC | PRN

## 2012-04-04 MED ORDER — DOCUSATE SODIUM 100 MG PO CAPS
100.0000 mg | ORAL_CAPSULE | Freq: Two times a day (BID) | ORAL | Status: DC
Start: 2012-04-04 — End: 2012-04-12
  Administered 2012-04-04 – 2012-04-12 (×16): 100 mg via ORAL
  Filled 2012-04-04 (×18): qty 1

## 2012-04-04 MED ORDER — OXYCODONE HCL 5 MG PO TABS
2.5000 mg | ORAL_TABLET | ORAL | Status: DC | PRN
  Administered 2012-04-05 – 2012-04-12 (×3): 2.5 mg via ORAL
  Filled 2012-04-04 (×3): qty 1

## 2012-04-04 MED ORDER — GUAIFENESIN-DM 100-10 MG/5ML PO SYRP
5.0000 mL | ORAL_SOLUTION | ORAL | Status: DC | PRN
  Filled 2012-04-04: qty 5

## 2012-04-04 MED ORDER — ACETAMINOPHEN 325 MG PO TABS
650.0000 mg | ORAL_TABLET | Freq: Four times a day (QID) | ORAL | Status: DC | PRN
  Administered 2012-04-08 – 2012-04-10 (×2): 650 mg via ORAL
  Filled 2012-04-04 (×2): qty 2

## 2012-04-04 MED ORDER — TRAMADOL HCL 50 MG PO TABS
25.0000 mg | ORAL_TABLET | Freq: Every evening | ORAL | Status: DC | PRN
  Filled 2012-04-04: qty 1

## 2012-04-04 MED ORDER — HYDROMORPHONE HCL PF 1 MG/ML IJ SOLN: 1.0000 mg | INTRAMUSCULAR | Status: DC | PRN

## 2012-04-04 MED ORDER — OXYCODONE-ACETAMINOPHEN 5-325 MG PO TABS
1.0000 | ORAL_TABLET | ORAL | Status: DC | PRN
  Administered 2012-04-04 – 2012-04-12 (×14): 1 via ORAL
  Filled 2012-04-04 (×14): qty 1

## 2012-04-04 MED ORDER — CALCITONIN (SALMON) 200 UNIT/ML IJ SOLN
4.0000 [IU]/kg | Freq: Two times a day (BID) | INTRAMUSCULAR | Status: DC
  Administered 2012-04-04 – 2012-04-11 (×16): 328 [IU] via INTRAMUSCULAR
  Filled 2012-04-04 (×26): qty 1.64

## 2012-04-04 MED ORDER — SODIUM CHLORIDE 0.9 % IV BOLUS (SEPSIS)
250.0000 mL | Freq: Once | INTRAVENOUS | Status: AC
  Administered 2012-04-04: 250 mL via INTRAVENOUS

## 2012-04-04 MED ORDER — ONDANSETRON HCL 4 MG PO TABS: 4.0000 mg | ORAL_TABLET | Freq: Four times a day (QID) | ORAL | Status: DC | PRN

## 2012-04-04 MED ORDER — CYCLOBENZAPRINE HCL 10 MG PO TABS
5.0000 mg | ORAL_TABLET | Freq: Three times a day (TID) | ORAL | Status: DC | PRN
  Filled 2012-04-04: qty 1

## 2012-04-04 MED ORDER — ONDANSETRON HCL 4 MG/2ML IJ SOLN
4.0000 mg | Freq: Four times a day (QID) | INTRAMUSCULAR | Status: DC | PRN
  Administered 2012-04-05: 4 mg via INTRAVENOUS
  Filled 2012-04-04: qty 2

## 2012-04-04 MED ORDER — SODIUM CHLORIDE 0.9 % IJ SOLN
3.0000 mL | Freq: Two times a day (BID) | INTRAMUSCULAR | Status: DC
  Administered 2012-04-04 – 2012-04-07 (×4): 3 mL via INTRAVENOUS

## 2012-04-04 NOTE — H&P (Addendum)
PCP:  [name], MD    Chief Complaint:   Back pain  HPI: [name] is a 76 y.o. female   has a past medical history of Anxiety; Hypertension; and Hyperlipidemia.   Presented with  History of back pain for 3 months. Have had some lower extremities weakness for past 6 weeks and urinary incontinence for about 3 months. She feels that weakness is getting worse. Also endorses history of bilateral hip pain. She is no longer able to walk without help.  Denies lost weight. Never had a colonoscopy. She thinks she never had a mammogram. She had a lumpectomy in the past but does not think it was for cancer. Patient received IV pain medication in emergency department and is currently somewhat lethargic. Her ER physician prior to medication she was very alert  of note she has been on steroid taper for her pain but she cannot tell me right now how far along she is in her taper  Denies SOB, Chest pain or fever.   Review of Systems:    Pertinent positives include: back pain, urinary incontinence, leg weakness.   Constitutional:  No weight loss, night sweats, Fevers, chills, fatigue, weight loss  HEENT:  No headaches, Difficulty swallowing,Tooth/dental problems,Sore throat,  No sneezing, itching, ear ache, nasal congestion, post nasal drip,  Cardio-vascular:  No chest pain, Orthopnea, PND, anasarca, dizziness, palpitations.no Bilateral lower extremity swelling  GI:  No heartburn, indigestion, abdominal pain, nausea, vomiting, diarrhea, change in bowel habits, loss of appetite, melena, blood in stool, hematemesis Resp:  no shortness of breath at rest. No dyspnea on exertion, No excess mucus, no productive cough, No non-productive cough, No coughing up of blood.No change in color of mucus.No wheezing. Skin:  no rash or lesions. No jaundice GU:  no dysuria, change in color of urine, no urgency or frequency. No straining to urinate.  No flank pain.  Musculoskeletal:  No joint pain  or no joint swelling. No decreased range of motion. No back pain.  Psych:  No change in mood or affect. No depression or anxiety. No memory loss.  Neuro: no localizing neurological complaints, no tingling, no weakness, no double vision, no gait abnormality, no slurred speech, no confusion  Otherwise ROS are negative except for above, 10 systems were reviewed  Past Medical History: Past Medical History  Diagnosis Date  . Anxiety   . Hypertension   . Hyperlipidemia    Past Surgical History  Procedure Date  . Breast lumpectomy      Medications: Prior to Admission medications   Medication Sig Start Date End Date Taking? Authorizing Provider  ALPRAZolam Prudy Feeler) 1 MG tablet Take 1 mg by mouth at bedtime as needed. Sleep.   Yes Historical Provider, MD  butalbital-aspirin-caffeine [facility]) 50-325-40 MG per capsule Take 1 capsule by mouth 2 (two) times daily as needed. Headache.   Yes Historical Provider, MD  cyclobenzaprine (FLEXERIL) 5 MG tablet Take 5 mg by mouth 3 (three) times daily as needed. Muscle spasms.   Yes Historical Provider, MD  meloxicam (MOBIC) 15 MG tablet Take 15 mg by mouth daily.   Yes Historical Provider, MD  Olmesartan-Amlodipine-HCTZ (TRIBENZOR) 40-10-12.5 MG TABS Take 1 tablet by mouth daily.   Yes Historical Provider, MD  oxyCODONE-acetaminophen (PERCOCET) 7.5-325 MG per tablet Take 1 tablet by mouth every 4 (four) hours as needed. Every 4 to 6 hours as needed for pain.   Yes Historical Provider, MD  QUEtiapine (SEROQUEL) 100 MG tablet Take 100 mg by mouth at  bedtime.   Yes Historical Provider, MD  rosuvastatin (CRESTOR) 10 MG tablet Take 10 mg by mouth daily.   Yes Historical Provider, MD  traMADol (ULTRAM) 50 MG tablet Take 25 mg by mouth at bedtime as needed. Pain.   Yes Historical Provider, MD  predniSONE (STERAPRED UNI-PAK) 10 MG tablet 6 tabs po day 1, 5 tabs po day 2, 4 tabs po day 3, 3 tabs po day 4, 2 tabs po day 5, 1 tab po day 6 03/21/12   Lenda Kelp,  MD    Allergies:   Allergies  Allergen Reactions  . Simvastatin Other (See Comments)    Joint aches     Social History:  Ambulatory difficult top ambulate Lives at  Home with husband   reports that she has never smoked. She does not have any smokeless tobacco history on file. She reports that she does not drink alcohol or use illicit drugs.   Family History: family history includes Diabetes in her sisters; Heart disease in her mother; Hyperlipidemia in her sisters; and Hypertension in her sisters.  There is no history of Sudden death and Heart attack.    Physical Exam: Patient Vitals for the past 24 hrs:  BP Temp Temp src Pulse Resp SpO2 Height Weight  04/04/12 0000 117/70 mmHg - - 86  12  97 % - -  04/03/12 2307 127/68 mmHg - - 87  14  95 % - -  04/03/12 1952 132/74 mmHg 98.5 F (36.9 C) Oral 95  20  96 % 5\' 7"  (1.702 m) 82.101 kg (181 lb)  04/03/12 1941 138/78 mmHg - - 84  20  - - -    1. General:  in No Acute distress 2. Psychological: Lethargic but  Oriented to self and place arousable to verbal stimuli 3. Head/ENT:    Dry Mucous Membranes                          Head Non traumatic, neck supple                          Normal  Dentition 4. SKIN:  decreased Skin turgor,  Skin clean Dry and intact no rash 5. Heart: Regular rate and rhythm no Murmur, Rub or gallop 6. Lungs: Clear to auscultation bilaterally, no wheezes or crackles   7. Abdomen: Soft, non-tender, Non distended 8. Lower extremities: no clubbing, cyanosis, or edema 9. Neurologically: strength 5/5 in all 4 ext. Able to raise legs off the bed, no focal deficit noted.  10. MSK: Normal range of motion  body mass index is 28.35 kg/(m^2).   Labs on Admission:   [facility] 04/03/12 2237  NA 135  K 4.9  CL 96  CO2 24  GLUCOSE 112*  BUN 32*  CREATININE 2.45*  CALCIUM 14.7*  MG --  PHOS --   No results found for this basename: AST:2,ALT:2,ALKPHOS:2,BILITOT:2,PROT:2,ALBUMIN:2 in the last 72 hours No  results found for this basename: LIPASE:2,AMYLASE:2 in the last 72 hours  Basename 04/03/12 2237  WBC 8.6  NEUTROABS 5.3  HGB 10.8*  HCT 31.4*  MCV 91.8  PLT 203   No results found for this basename: CKTOTAL:3,CKMB:3,CKMBINDEX:3,TROPONINI:3 in the last 72 hours No results found for this basename: TSH,T4TOTAL,FREET3,T3FREE,THYROIDAB in the last 72 hours No results found for this basename: VITAMINB12:2,FOLATE:2,FERRITIN:2,TIBC:2,IRON:2,RETICCTPCT:2 in the last 72 hours Lab Results  Component Value Date   HGBA1C 5.6 09/08/2010  Estimated Creatinine Clearance: 22.2 ml/min (by C-G formula based on Cr of 2.45). ABG No results found for this basename: phart, pco2, po2, hco3, tco2, acidbasedef, o2sat     No results found for this basename: DDIMER      EKG personally reviewed showing heart rate of 87 normal sinus rhythm no ischemic changes QTC slightly prolonged at 456   UA protein uria   Cultures: No results found for this basename: sdes, specrequest, cult, reptstatus       Radiological Exams on Admission:  *RADIOLOGY REPORT*  Clinical Data: Low back pain  LUMBAR SPINE - 2-3 VIEW  Comparison: 10/05/2008  Findings: Severe narrowing of the L2-3 and L3-4 discs. Moderate  narrowing at L4-5. Minimal retrolisthesis L3 upon L4 unchanged.  Levoscoliosis with the apex at L2-3. No vertebral compression  deformity. Osteopenia. Lower lumbar facet arthropathy.  IMPRESSION:  Chronic and degenerative changes. No acute bony pathology.    Assessment/Plan  76 year old female with history of back pain for the past 3 months now with evidence of hypercalcemia, dehydration, acute renal failure and hyperproteinemia/proteinuria all this worrisome for malignancy multiple myeloma in particular.  Present on Admission:  .Hypercalcemia - will admit him watching telemetry initiate IV fluids give calcitonin. Will start at 4 units/kg every 12 hours and if there's no improvement will need to increase  up to 8 units/ kg q. 6h.  Secondary to renal failure she is not a candidate for bisphosphonates. We'll need cancer workup as well as endocrine workup. Will check PTH, PTH RP, TSH, vitamin D levels.  in the future if workup is small indicative of cancer would need thorough workup for now start with chest x-ray  .HYPERTENSIOn  - in a setting of acute renal failure will hold off on ACE inhibitor  .BACK PAIN, LUMBAR - no acute neurological changes. His symptoms have been ongoing for past 3 months but she will benefit from MRI of lumbar spine in a.m.  .Acute renal failure -  will order renal ultrasound, SPEP UPEP, urine electrolytes  .Dehydration - the setting of hypercalcemia will administer IV fluids   hip pain we'll begin evaluation by obtaining plain films   anemia - will obtain anemia panel and Hemoccult stool suspect this is part of underlying process. Prolonged QTC  - monitor on telemetry we'll hold Seroquel   Prophylaxis: SCD Protonix  CODE STATUS: Should be clarified in a.m. currently patient is unable to give me clear answer  Other plan as per orders.  I have spent a total of 70 min on this admission spoke to radiology and pharmacy   Zayleigh Stroh 04/04/2012, 1:25 AM

## 2012-04-04 NOTE — Progress Notes (Signed)
   CARE MANAGEMENT NOTE 04/04/2012  Patient:  [name], [name]   Account Number:  [id number]  Date Initiated:  04/04/2012  Documentation initiated by:  Jiles Crocker  Subjective/Objective Assessment:   ADMITTED WITH BACK PAIN, Hypercalcemia     Action/Plan:   PCP:  [name], MD;  LIVES AT HOME WITH SPOUSE   Anticipated DC Date:  04/11/2012   Anticipated DC Plan:  HOME W HOME HEALTH SERVICES      DC Planning Services  CM consult       Status of service:  In process, will continue to follow Medicare Important Message given?  NA - LOS <3 / Initial given by admissions (If response is "NO", the following Medicare IM given date fields will be blank)  Per UR Regulation:  Reviewed for med. necessity/level of care/duration of stay  Comments:  04/04/2012- B Caridad Silveira RN, BSN, MHA

## 2012-04-04 NOTE — Progress Notes (Signed)
Subjective: AMS improved; just weak and debilitated. Denies CP, SOB or fever.  Objective: Vital signs in last 24 hours: Temp:  [98.1 F (36.7 C)-98.5 F (36.9 C)] 98.1 F (36.7 C) (07/08 1402) Pulse Rate:  [82-95] 92  (07/08 1402) Resp:  [11-20] 16  (07/08 1402) BP: (112-138)/(68-78) 120/78 mmHg (07/08 1402) SpO2:  [84 %-97 %] 97 % (07/08 1402) Weight:  [79.6 kg (175 lb 7.8 oz)-82.101 kg (181 lb)] 79.6 kg (175 lb 7.8 oz) (07/08 0400) Weight change:  Last BM Date: 04/03/12  Intake/Output from previous day: 07/07 0701 - 07/08 0700 In: 677.1 [I.V.:677.1] Out: 325 [Urine:325] Total I/O In: 120 [P.O.:120] Out: 750 [Urine:750]   Physical Exam: General: Alert, awake, oriented x2, in no acute distress. HEENT: No bruits, no goiter. Heart: Regular rate and rhythm, without murmurs, rubs, gallops. Lungs: Clear to auscultation bilaterally. Abdomen: Soft, nontender, nondistended, positive bowel sounds. Extremities: No clubbing or edema; Left big toe amputated; wound healing ok. Neuro: Grossly intact, nonfocal.  Lab Results: Basic Metabolic Panel:  Basename 04/04/12 0420 04/03/12 2237  NA 135 135  K 4.8 4.9  CL 96 96  CO2 23 24  GLUCOSE 115* 112*  BUN 29* 32*  CREATININE 2.34* 2.45*  CALCIUM 14.0* 14.7*  MG 2.5 --  PHOS 5.1* --   Liver Function Tests:  [facility] 04/04/12 0420  AST 16  ALT 11  ALKPHOS 60  BILITOT 0.2*  PROT 9.7*  ALBUMIN 2.7*   CBC:  Basename 04/04/12 0420 04/03/12 2237  WBC 7.4 8.6  NEUTROABS -- 5.3  HGB 10.8* 10.8*  HCT 31.6* 31.4*  MCV 92.4 91.8  PLT 185 203   Thyroid Function Tests:  Basename 04/04/12 0420  TSH 1.662  T4TOTAL --  FREET4 --  T3FREE --  THYROIDAB --   Anemia Panel:  Basename 04/04/12 0420  VITAMINB12 1696*  FOLATE 10.6  FERRITIN 50  TIBC 262  IRON 67  RETICCTPCT 0.8   Urinalysis:  Basename 04/03/12 2229  COLORURINE YELLOW  LABSPEC 1.016  PHURINE 6.5  GLUCOSEU NEGATIVE  HGBUR NEGATIVE  BILIRUBINUR  NEGATIVE  KETONESUR NEGATIVE  PROTEINUR 100*  UROBILINOGEN 0.2  NITRITE NEGATIVE  LEUKOCYTESUR NEGATIVE    Studies/Results: X-ray Chest Pa And Lateral   04/04/2012  *RADIOLOGY REPORT*  Clinical Data: 76 year old female with cough shortness of breath chest pain.  CHEST - 2 VIEW  Comparison: 04/11/2007.  Lumbar MRI from 04/04/2012.  Findings: Upright AP and lateral views of the chest.  There is a peripheral right lung 6 cm mass-like opacity.  This may be pleural based or might be extrapleural.  Mediastinal and hilar contours appear stable and within normal limits. The anterior ribs in the region of the mass are indistinct.  The left lung appears stable and clear. Visualized tracheal air column is within normal limits.  No definite other osseous lesion identified.  IMPRESSION: 1.  Mass-like opacity in the right hemithorax, may be pleural based or could be extrapleural (indeterminate adjacent rib involvement). In light of the lumbar findings earlier today, top differential considerations are multiple myeloma versus bronchogenic carcinoma of the right chest.  Chest CT (IV contrast preferred) would characterize further. 2.  No other cardiopulmonary abnormality identified.  Original Report Authenticated By: Harley Hallmark, M.D.   Dg Hip Bilateral W/pelvis  04/04/2012  *RADIOLOGY REPORT*  Clinical Data: 76 year old female with pain.  Abnormal lumbar MRI.  BILATERAL HIP WITH PELVIS - 4+ VIEW  Comparison: Lumbar MRI from the same day reported separately. Lumbar radiographs 03/19/2012.  Findings: A subtle but asymmetric decreased bone mineralization of the proximal right femur.  A small (3-4 mm) and lucent lesions are noted in both intertrochanteric regions.  No proximal femoral pathologic fracture is identified.  There may be a small lucent lesions of the left iliac crest versus overlying bowel gas.  No pelvic fracture is identified.  Bone mineralization in the pelvis is similar to that seen in the visible lumbar  spine.  IMPRESSION: Decreased bone mineralization in the pelvis and proximal femurs similar to that in the lumbar spine with possible superimposed small lucent lesions, but no pathologic fracture identified. Appearance compatible with widespread multiple myeloma (favored) or bone metastases.  Original Report Authenticated By: Harley Hallmark, M.D.   Mr Lumbar Spine Wo Contrast  04/04/2012  *RADIOLOGY REPORT*  Clinical Data: Low back pain and lower extremity weakness.  MRI LUMBAR SPINE WITHOUT CONTRAST  Technique:  Multiplanar and multiecho pulse sequences of the lumbar spine were obtained without intravenous contrast.  Comparison: Radiographs dated 03/19/2012 and CT scan of the abdomen dated 10/05/2008  Findings: The scan extends from T11-12 through S3.  Tip of the conus is at L1 and appears normal.  There are innumerable lesions throughout the visualized bones. There is a pathologic Schmorl's node in the inferior aspect of L3. The appearance is most typical for multiple myeloma.  T11-12:  The disc is normal.  T12-L1:  There is a Schmorl's node in the inferior endplate of T12 which may be related to the numerous lesions.  No disc bulging.  L1-2:  Small annular disc tear asymmetric bulge of the left of midline with no neural impingement.  L2-3:  Broad-based disc bulge with a small protrusion into the right neural foramen.  Right L2 nerve exits without impingement. Slight narrowing of the spinal canal and right lateral recess without focal neural impingement.  L3-4: There is a 5 mm retrolisthesis of L3 on L4 with broad-based bulge of the disc narrowing the spinal canal slightly without focal neural impingement.  L4-5:  Slight disc space narrowing with a tiny broad-based disc bulge with osteophyte formation and slight hypertrophy of the left facet joint and ligamentum flavum.  L5-S1:  The disc is normal.  Moderate right and mild left facet arthritis.  There are numerous lesions in the sacrum.  Incidental note is made  of a 2.7 cm gallstone.  Gallbladder wall is not thickened.  IMPRESSION: 1.  There are innumerable lesions throughout the entire visualized portion of the skeleton, most likely multiple myeloma. Metastatic cancer can give the same appearance. 2.  Probable pathologic Schmorl's nodes in the inferior aspects of T12 and L3. 3.  No significant neural impingement in the lumbar spine.  Critical Value/emergent results were called by telephone at the time of interpretation on 04/04/2012  at 12:19 p.m.  to  Dr. Vassie Loll, who verbally acknowledged these results.  Original Report Authenticated By: Gwynn Burly, M.D.   US Renal  04/04/2012  *RADIOLOGY REPORT*  Clinical Data: Renal failure.  Hypertension.  RENAL/URINARY TRACT ULTRASOUND COMPLETE  Comparison:  CT of 10/05/2008  Findings:  Right Kidney:  10.6 cm. No hydronephrosis.  Normal renal cortical thickness and echogenicity.  Left Kidney:  11.1 cm. No hydronephrosis.  Normal renal cortical thickness and echogenicity.  9 mm cyst or minimally complex cyst in the lower pole left kidney.  Bladder:  Within normal limits.  Incidental note is made of a 2.5 cm gallstone, without wall thickening or pericholecystic fluid.  IMPRESSION:  1.  No acute process or explanation for renal failure. 2.  Cholelithiasis.  Original Report Authenticated By: Consuello Bossier, M.D.    Medications: Scheduled Meds:   . atorvastatin  20 mg Oral q1800  . calcitonin  4 Units/kg Intramuscular Q12H  . docusate sodium  100 mg Oral BID  . ondansetron  4 mg Intravenous Once  . predniSONE  20 mg Oral Q breakfast  . sodium chloride  250 mL Intravenous Once  . sodium chloride  250 mL Intravenous Once  . sodium chloride  3 mL Intravenous Q12H  . DISCONTD: sodium chloride   Intravenous STAT   Continuous Infusions:   . sodium chloride 150 mL/hr at 04/04/12 1008  . sodium chloride    . DISCONTD: sodium chloride 100 mL/hr at 04/03/12 2314   PRN Meds:.acetaminophen, acetaminophen,  albuterol, ALPRAZolam, cyclobenzaprine, guaiFENesin-dextromethorphan, ondansetron (ZOFRAN) IV, ondansetron, oxyCODONE, oxyCODONE-acetaminophen, traMADol, DISCONTD:  HYDROmorphone (DILAUDID) injection, DISCONTD: ondansetron (ZOFRAN) IV, DISCONTD: oxyCODONE-acetaminophen  Assessment/Plan: 1-Acute renal failure: due to dehydration and most likely MM; will continue IVF's and will follow Cr trend.  2-Hypercalcemia: with bone pain, anemia, renal failure, multiple bone lesions and hypercalcemia is most likely MM. Oncology service has been consulted. BM biopsy tomorrow; will follow rec's. Biphosphonate once clear by dentist and rehydrated.  3-AMS: better; due to hypercalcemia and ARF. Will continue IVF's.  4-Hip pain: continue pain meds.  5-Anemia; no transfusion needed. Will follow Hgb trend  6-HYPERTENSION: stable. Will monitor Vs while we hold nephrotoxic antihypertensive drugs.  7-BACK PAIN, LUMBAR: due to MM most likely. Will need treatment as recommended by oncologist; if chemo along failed; then she will required local radiation.   8-Dehydration: will provide IVF's  9-Deconditioning and MSK pain:will ask PT/OT to evaluate and provide rec's for safe discharge.       LOS: 1 day   Annamarie Yamaguchi [facility] [phone]  04/04/2012, 4:23 PM

## 2012-04-04 NOTE — Consult Note (Signed)
[facility] CONSULTATION NOTE  Reason for Consult: r/o Multiple Myeloma   ZOX:WRUEAVW C Lona is a 76 y.o. white female from [place], [place], admitted for evaluation of progressive, uncontrollable pain.  In review, the patient had the experience lower back pain since January of 2013, in addition to her chronic bilateral hip pain, felt initially to be secondary to muscular etiology, controlled with muscle relaxers and NSAIDs. However, over the last 3 months, her pain had become increasingly worse, complicated with urinary incontinence over the last 3 weeks and decreased ambulation due to weakness in her lower extremities. Her family reports that for the last 3 weeks she also has been showing increasing confusion. Of note, the patient had been seen in 03/21/2012 by Dr. Pearletha Forge, Orthopedics, at which time an x-ray of the lower spine had shown severe DDD, but no evidence of malignancy. At the time, she was advised to try a 1 time prednisone dose pack for inflammation and pain, and then transitioned to meloxicam. In addition, Flexeril was given as needed for spasms. Due to exacerbation of symptoms, the patient presented to the emergency department at [facility]. At this time, she is unable to walk without help. Lab demonstrated hypercalcemia, with calcium levels of 14.7. Creatinine levels are 2.45 .Sodium was normal at 135. Her hemoglobin is 10.8 with a hematocrit of 31.4. Proteinuria was noted. Although her lumbar spine x-ray showed only chronic and degenerative changes and no acute bony pathology was seen, MRI of the L spine without contrast was performed, revealing innumerable lesions throughout the entire visualized portion of the skeleton and sacrum. No significant neural impingement in the lumbar spine. CXR 7/8 showed a mass-like opacity in the right hemithorax, may be pleural based versus extrapleural. Workup for multiple myeloma was initiated based on the mentioned findings.  SPEP and  UPEP are pending. We were requested to see the patient with recommendations.   PMH: Past Medical History  Diagnosis Date  . Anxiety   . Hypertension   . Hyperlipidemia   . Depression   . Osteopenia     Surgeries: Past Surgical History  Procedure Date  . Breast lumpectomy     Allergies:  Allergies  Allergen Reactions  . Simvastatin Other (See Comments)    Joint aches     Medications:  Prior to Admission:  Prescriptions prior to admission  Medication Sig Dispense Refill  . ALPRAZolam (XANAX) 1 MG tablet Take 1 mg by mouth at bedtime as needed. Sleep.      . butalbital-aspirin-caffeine (FIORINAL) 50-325-40 MG per capsule Take 1 capsule by mouth 2 (two) times daily as needed. Headache.      . cyclobenzaprine (FLEXERIL) 5 MG tablet Take 5 mg by mouth 3 (three) times daily as needed. Muscle spasms.      . meloxicam (MOBIC) 15 MG tablet Take 15 mg by mouth daily.      . Olmesartan-Amlodipine-HCTZ (TRIBENZOR) 40-10-12.5 MG TABS Take 1 tablet by mouth daily.      Marland Kitchen oxyCODONE-acetaminophen (PERCOCET) 7.5-325 MG per tablet Take 1 tablet by mouth every 4 (four) hours as needed. Every 4 to 6 hours as needed for pain.      Marland Kitchen QUEtiapine (SEROQUEL) 100 MG tablet Take 100 mg by mouth at bedtime.      . rosuvastatin (CRESTOR) 10 MG tablet Take 10 mg by mouth daily.      . traMADol (ULTRAM) 50 MG tablet Take 25 mg by mouth at bedtime as needed. Pain.      Marland Kitchen  predniSONE (STERAPRED UNI-PAK) 10 MG tablet 6 tabs po day 1, 5 tabs po day 2, 4 tabs po day 3, 3 tabs po day 4, 2 tabs po day 5, 1 tab po day 6  21 tablet  0    BJY:NWGNFAOZHYQMV, acetaminophen, albuterol, ALPRAZolam, cyclobenzaprine, guaiFENesin-dextromethorphan, ondansetron (ZOFRAN) IV, ondansetron, oxyCODONE, oxyCODONE-acetaminophen, traMADol, DISCONTD:  HYDROmorphone (DILAUDID) injection, DISCONTD: ondansetron (ZOFRAN) IV, DISCONTD: oxyCODONE-acetaminophen  ROS: Constitutional: Negative for weight loss. Negative for fever,  chills and positive for malaise/fatigue.  Eyes: Negative for blurred vision and double vision.  Respiratory: Negative for cough, hemoptysis and shortness of breath.  Cardiovascular: Negative for chest pain. GI: No nausea, vomiting, diarrhea, but positive for constipation since last January. No change in bowel caliber. No  Melena or hematochezia.  GU: No blood in urine macroscopically.  loss of urinary control over the last 3 weeks, as above. Skin: Negative for itching. No rash. No petechia. No bruising Musculoskeletal: Back pain  has been present since January of 2013, initially believed to be muscular, requiring Flexeril with some relief. However, it persisted though especially bad when getting out of bed or out of a chair. No radiation. No numbness or tingling.Has been alternating heat and ice.pain has become worse, now unable to "find a comfortable spot" Neurological: No headaches.Began to experience weakness over the last 6 weeks and urinary incontinence for last 3 months. Family reports increasing confusion over the last 3 weeks.   Family History:  Family History  Problem Relation Age of Onset  . Heart disease Mother   . Hyperlipidemia Sister   . Hypertension Sister   . Diabetes Sister   . Diabetes Sister   . Hyperlipidemia Sister   . Hypertension Sister   . Sudden death Neg Hx   . Heart attack Neg Hx     Social History:  reports that she has never smoked. She does not have any smokeless tobacco history on file. She reports that she does not drink alcohol or use illicit drugs. Patient is married, one son in good health. One granddaughter is accompanying her today. She is retired from [employer]. Patient lives in [place].  Physical Exam  76 year old WF  in no acute distress but uncomfortable,  Awake, oriented to person, but not to place, date. She states ids July 4th 2005.    General well-developed and well-nourished  HEENT: Normocephalic, atraumatic, PERRLA. Oral cavity without  thrush or lesions.oral mucosa dry. Neck supple. no thyromegaly, no cervical or supraclavicular adenopathy  Lungs clear bilaterally . No wheezing, rhonchi or rales. No axillary masses. Breasts: not examined. Last MGM was performed on 06/10/11, negative.  Cardiac regular rate and rhythm normal S1-S2, no murmur , rubs or gallops Abdomen soft nontender , bowel sounds x4. No HSM GU/rectal: deferred. Extremities no clubbing cyanosis or edema. No bruising or petechial rash. Skin dry. Neuro: Strength intact in all 4 extremities. Sensation intact. Confusion noted.     Labs:  CBC   Lab 04/04/12 0420 04/03/12 2237  WBC 7.4 8.6  HGB 10.8* 10.8*  HCT 31.6* 31.4*  PLT 185 203  MCV 92.4 91.8  MCH 31.6 31.6  MCHC 34.2 34.4  RDW 12.9 12.7  LYMPHSABS -- 2.2  MONOABS -- 0.7  EOSABS -- 0.3  BASOSABS -- 0.1  BANDABS -- --     Anemia panel:   Basename 04/04/12 0420  VITAMINB12 1696*  FOLATE 10.6  FERRITIN 50  TIBC 262  IRON 67  RETICCTPCT 0.8     Basename 04/04/12 0420  TSH  1.662  T4TOTAL --  T3FREE --  THYROIDAB --     CMP    Lab 04/04/12 0420 04/03/12 2237  NA 135 135  K 4.8 4.9  CL 96 96  CO2 23 24  GLUCOSE 115* 112*  BUN 29* 32*  CREATININE 2.34* 2.45*  CALCIUM 14.0* 14.7*  MG 2.5 --  AST 16 --  ALT 11 --  ALKPHOS 60 --  BILITOT 0.2* --        Component Value Date/Time   BILITOT 0.2* 04/04/2012 0420   BILIDIR 0.1 04/10/2008 2020   IBILI 0.1 04/10/2008 2020    Imaging Studies:  X-ray Chest Pa And Lateral   04/04/2012  *RADIOLOGY REPORT*  Clinical Data: 76 year old female with cough shortness of breath chest pain.  CHEST - 2 VIEW  Comparison: 04/11/2007.  Lumbar MRI from 04/04/2012.  Findings: Upright AP and lateral views of the chest.  There is a peripheral right lung 6 cm mass-like opacity.  This may be pleural based or might be extrapleural.  Mediastinal and hilar contours appear stable and within normal limits. The anterior ribs in the region of the mass  are indistinct.  The left lung appears stable and clear. Visualized tracheal air column is within normal limits.  No definite other osseous lesion identified.  IMPRESSION: 1.  Mass-like opacity in the right hemithorax, may be pleural based or could be extrapleural (indeterminate adjacent rib involvement). In light of the lumbar findings earlier today, top differential considerations are multiple myeloma versus bronchogenic carcinoma of the right chest.  Chest CT (IV contrast preferred) would characterize further. 2.  No other cardiopulmonary abnormality identified.  Original Report Authenticated By: Harley Hallmark, M.D.   Mr Lumbar Spine Wo Contrast  04/04/2012  *RADIOLOGY REPORT*  Clinical Data: Low back pain and lower extremity weakness.  MRI LUMBAR SPINE WITHOUT CONTRAST  Technique:  Multiplanar and multiecho pulse sequences of the lumbar spine were obtained without intravenous contrast.  Comparison: Radiographs dated 03/19/2012 and CT scan of the abdomen dated 10/05/2008  Findings: The scan extends from T11-12 through S3.  Tip of the conus is at L1 and appears normal.  There are innumerable lesions throughout the visualized bones. There is a pathologic Schmorl's node in the inferior aspect of L3. The appearance is most typical for multiple myeloma.  T11-12:  The disc is normal.  T12-L1:  There is a Schmorl's node in the inferior endplate of T12 which may be related to the numerous lesions.  No disc bulging.  L1-2:  Small annular disc tear asymmetric bulge of the left of midline with no neural impingement.  L2-3:  Broad-based disc bulge with a small protrusion into the right neural foramen.  Right L2 nerve exits without impingement. Slight narrowing of the spinal canal and right lateral recess without focal neural impingement.  L3-4: There is a 5 mm retrolisthesis of L3 on L4 with broad-based bulge of the disc narrowing the spinal canal slightly without focal neural impingement.  L4-5:  Slight disc space  narrowing with a tiny broad-based disc bulge with osteophyte formation and slight hypertrophy of the left facet joint and ligamentum flavum.  L5-S1:  The disc is normal.  Moderate right and mild left facet arthritis.  There are numerous lesions in the sacrum.  Incidental note is made of a 2.7 cm gallstone.  Gallbladder wall is not thickened.  IMPRESSION: 1.  There are innumerable lesions throughout the entire visualized portion of the skeleton, most likely multiple myeloma. Metastatic cancer  can give the same appearance. 2.  Probable pathologic Schmorl's nodes in the inferior aspects of T12 and L3. 3.  No significant neural impingement in the lumbar spine.  Critical Value/emergent results were called by telephone at the time of interpretation on 04/04/2012  at 12:19 p.m.  to  Dr. Vassie Loll, who verbally acknowledged these results.  Original Report Authenticated By: Gwynn Burly, M.D.   US Renal  04/04/2012  *RADIOLOGY REPORT*  Clinical Data: Renal failure.  Hypertension.  RENAL/URINARY TRACT ULTRASOUND COMPLETE  Comparison:  CT of 10/05/2008  Findings:  Right Kidney:  10.6 cm. No hydronephrosis.  Normal renal cortical thickness and echogenicity.  Left Kidney:  11.1 cm. No hydronephrosis.  Normal renal cortical thickness and echogenicity.  9 mm cyst or minimally complex cyst in the lower pole left kidney.  Bladder:  Within normal limits.  Incidental note is made of a 2.5 cm gallstone, without wall thickening or pericholecystic fluid.  IMPRESSION:  1. No acute process or explanation for renal failure. 2.  Cholelithiasis.  Original Report Authenticated By: Consuello Bossier, M.D.        A/P: 76 y.o. female admitted with progressive lower back and bilateral hip pain, urinary incontinence and recent confusion, found to have Hypercalcemia, proteinuria and anemia, as well as numerous bony lesions in the L spine as well as the Sacrum, suspicious for multiple myeloma. SPEP and UPEP are pending.  Dr. Gaylyn Rong  is to  see the patient following this consult with recommendations regarding diagnosis, treatment options and further workup studies.  Thank you for the referral.  [facility] 04/04/2012 2:20 PM   ================================  Attending's note.  I personally reviewed patient's chart including notes, labs, MRI.  I agreed with mild level's H&P.  I personally interviewed patient, and discussed with them the following plan.    PROBLEM LISTS:  1.  Hypertension 2.  Hyperlipidemia. 3.  Acute renal failure due to hypercalcemia and suspected multiple myeloma. 4.  Hypercalcemia:  Due to suspected multiple myeloma.  5.  Moderate deconditioning due to bone pain. 6.  Lytic bone lesions causing bone pain:  From suspected multiple myeloma.   IMPRESSIONS:  With combination of acute renal failure, hypercalcemia, anemia, and lytic bone lesions, this is multiple myeloma until proven otherwise.  I sent for SPEP, IFIX, serum free light chain, 24 hour urine collection for proteinuria and UPEP.  However, definitive diagnosis come from diagnostic bone marrow biopsy to ascertain that she has >10% plasma cell on bone marrow aspirate.  In addition, cytogenetics and myeloma FISH panel can give prognostic information.  Treatment for myeloma entail induction chemotherapy in the form of Velcade SQ, oral Revlimid (when her renal function improves) and Dexamethasone.  We may or may not require bone marrow transplant depending on her performance status and prognostic cytogenetics/FISH panel.   Due to her diffuse disease, it is difficult to offer diffuse radiation.  However, if despite chemo, bisphosphonate, and pain control, and her pain is still severe, I may consider holding chemo and referring her to Rad Onc for palliative radiation.   Given bone met and hypercalcemia, she would benefit from pamidronate or Zometa to normalize her hypercalcemia and decrease the risk of skeletal-related events.  There is a small risk of  osteonecrosis of the jaw with bisphos.  Thus, she needs dental eval in addition to rehydration prior to bisphosphonate.    RECCOMMENDATIONS:  - Dental eval (order placed). - After dental clearance, and patient is euvolumic with IVU resuscitation,  consider pamidronate renally dosed at 60mg  IV over 6 hours.  - Bone marrow biopsy (order placed) with IR.  - Consider PT/OT eval to see if patient can qualify for either SNF or home PT/OT.  - I will follow up with further recommendations.  - Discharge planning:  From hem/onc standpoint, OK to discharge once hypercalcemia and renal insufficiency improve in about 2 days.

## 2012-04-04 NOTE — Care Management Note (Signed)
CRITICAL VALUE ALERT  Critical value received:  Calcium 14.0  Date of notification:  04/04/2012  Time of notification:  0550  Critical value read back:yes  Nurse who received alert:  D.Lundon Verdejo  MD notified (1st page):  m.[email or web address]  Time of first page:  0552  MD notified (2nd page):  Time of second page:  Responding MD:  m.[email or web address]  Time MD responded:  [phone]

## 2012-04-04 NOTE — ED Provider Notes (Addendum)
History     CSN: [id number]  Arrival date & time 04/03/12  1941   First MD Initiated Contact with Patient 04/03/12 2143      Chief Complaint  Patient presents with  . Back Pain  . Weakness    (Consider location/radiation/quality/duration/timing/severity/associated sxs/prior treatment) The history is provided by the patient, the spouse and the EMS personnel.   patient is a 76 year old female with history of low back pain problems starting in January but worse the last 3 weeks worse last 3 days. Patient having difficulty getting around at home and all husband not able to care for her properly by mouth intake and food intake has been decreased patient has severe pain when up and walking relates to work causes incontinence. No focal neurological deficits lower trimming the no incontinence when laying flat and the pain is not present. Are not present as much. Patient does have pain medicine having trouble controlling that she's also been treated with a prednisone 10 mg a day. Patient scheduled for MRI later today he is normally followed occurs he'll med Center. Pain is 10 out of 10 worse with any movement. Denies any weakness to the legs or numbness to the feet. Patient was brought in by EMS. Pain is described as sharp. Some both sides the low back and does radiate to the flank areas.  Past Medical History  Diagnosis Date  . Anxiety   . Hypertension   . Hyperlipidemia     Past Surgical History  Procedure Date  . Breast lumpectomy     Family History  Problem Relation Age of Onset  . Heart disease Mother   . Hyperlipidemia Sister   . Hypertension Sister   . Diabetes Sister   . Diabetes Sister   . Hyperlipidemia Sister   . Hypertension Sister   . Sudden death Neg Hx   . Heart attack Neg Hx     History  Substance Use Topics  . Smoking status: Never Smoker   . Smokeless tobacco: Not on file  . Alcohol Use: No    OB History    Grav Para Term Preterm Abortions TAB SAB Ect Mult  Living                  Review of Systems  Constitutional: Negative for fever and chills.  HENT: Negative for neck pain.   Eyes: Negative for visual disturbance.  Respiratory: Negative for shortness of breath.   Cardiovascular: Negative for chest pain.  Gastrointestinal: Negative for nausea, vomiting, abdominal pain and diarrhea.  Genitourinary: Negative for dysuria and hematuria.  Musculoskeletal: Positive for back pain.  Skin: Negative for rash.  Neurological: Negative for weakness, numbness and headaches.  Hematological: Does not bruise/bleed easily.    Allergies  Simvastatin  Home Medications   Current Outpatient Rx  Name Route Sig Dispense Refill  . ALPRAZOLAM 1 MG PO TABS Oral Take 1 mg by mouth at bedtime as needed. Sleep.    Marland Kitchen BUTALBITAL-ASPIRIN-CAFFEINE 50-325-40 MG PO CAPS Oral Take 1 capsule by mouth 2 (two) times daily as needed. Headache.    . CYCLOBENZAPRINE HCL 5 MG PO TABS Oral Take 5 mg by mouth 3 (three) times daily as needed. Muscle spasms.    . MELOXICAM 15 MG PO TABS Oral Take 15 mg by mouth daily.    Marland Kitchen OLMESARTAN-AMLODIPINE-HCTZ 40-10-12.5 MG PO TABS Oral Take 1 tablet by mouth daily.    . OXYCODONE-ACETAMINOPHEN 7.5-325 MG PO TABS Oral Take 1 tablet by mouth every 4 (  four) hours as needed. Every 4 to 6 hours as needed for pain.    Marland Kitchen QUETIAPINE FUMARATE 100 MG PO TABS Oral Take 100 mg by mouth at bedtime.    Marland Kitchen ROSUVASTATIN CALCIUM 10 MG PO TABS Oral Take 10 mg by mouth daily.    . TRAMADOL HCL 50 MG PO TABS Oral Take 25 mg by mouth at bedtime as needed. Pain.    Marland Kitchen PREDNISONE (PAK) 10 MG PO TABS  6 tabs po day 1, 5 tabs po day 2, 4 tabs po day 3, 3 tabs po day 4, 2 tabs po day 5, 1 tab po day 6 21 tablet 0    BP 117/70  Pulse 86  Temp 98.5 F (36.9 C) (Oral)  Resp 12  Ht 5\' 7"  (1.702 m)  Wt 181 lb (82.101 kg)  BMI 28.35 kg/m2  SpO2 97%  Physical Exam  Nursing note and vitals reviewed. Constitutional: She is oriented to person, place, and time.  She appears well-developed and well-nourished. No distress.  HENT:  Head: Normocephalic and atraumatic.       Mucous membranes are dry.  Eyes: Conjunctivae and EOM are normal. Pupils are equal, round, and reactive to light.  Neck: Normal range of motion. Neck supple.  Cardiovascular: Normal rate and regular rhythm.   No murmur heard. Pulmonary/Chest: Effort normal and breath sounds normal.  Abdominal: Soft. Bowel sounds are normal. There is no tenderness.  Musculoskeletal: Normal range of motion. She exhibits no tenderness.       Bilateral paraspinous  lumbar back pain. Recent removal of the left great toenail. Sensation intact in both feet. Motor intact.  Neurological: She is alert and oriented to person, place, and time. No cranial nerve deficit. She exhibits normal muscle tone. Coordination normal.  Skin: Skin is warm. No rash noted.    ED Course  Procedures (including critical care time)  Labs Reviewed  CBC WITH DIFFERENTIAL - Abnormal; Notable for the following:    RBC 3.42 (*)     Hemoglobin 10.8 (*)     HCT 31.4 (*)     All other components within normal limits  BASIC METABOLIC PANEL - Abnormal; Notable for the following:    Glucose, Bld 112 (*)     BUN 32 (*)     Creatinine, Ser 2.45 (*)     Calcium 14.7 (*)     GFR calc non Af Amer 18 (*)     GFR calc Af Amer 21 (*)     All other components within normal limits  URINALYSIS, ROUTINE W REFLEX MICROSCOPIC - Abnormal; Notable for the following:    APPearance CLOUDY (*)     Protein, ur 100 (*)     All other components within normal limits  URINE MICROSCOPIC-ADD ON - Abnormal; Notable for the following:    Squamous Epithelial / LPF FEW (*)     Casts HYALINE CASTS (*)     All other components within normal limits   No results found. Results for orders placed during the hospital encounter of 04/03/12  CBC WITH DIFFERENTIAL      Component Value Range   WBC 8.6  4.0 - 10.5 K/uL   RBC 3.42 (*) 3.87 - 5.11 MIL/uL    Hemoglobin 10.8 (*) 12.0 - 15.0 g/dL   HCT 40.9 (*) 81.1 - 91.4 %   MCV 91.8  78.0 - 100.0 fL   MCH 31.6  26.0 - 34.0 pg   MCHC 34.4  30.0 - 36.0  g/dL   RDW 16.1  09.6 - 04.5 %   Platelets 203  150 - 400 K/uL   Neutrophils Relative 62  43 - 77 %   Lymphocytes Relative 26  12 - 46 %   Monocytes Relative 8  3 - 12 %   Eosinophils Relative 3  0 - 5 %   Basophils Relative 1  0 - 1 %   Neutro Abs 5.3  1.7 - 7.7 K/uL   Lymphs Abs 2.2  0.7 - 4.0 K/uL   Monocytes Absolute 0.7  0.1 - 1.0 K/uL   Eosinophils Absolute 0.3  0.0 - 0.7 K/uL   Basophils Absolute 0.1  0.0 - 0.1 K/uL   WBC Morphology INCREASED BANDS (>20% BANDS)    BASIC METABOLIC PANEL      Component Value Range   Sodium 135  135 - 145 mEq/L   Potassium 4.9  3.5 - 5.1 mEq/L   Chloride 96  96 - 112 mEq/L   CO2 24  19 - 32 mEq/L   Glucose, Bld 112 (*) 70 - 99 mg/dL   BUN 32 (*) 6 - 23 mg/dL   Creatinine, Ser 4.09 (*) 0.50 - 1.10 mg/dL   Calcium 81.1 (*) 8.4 - 10.5 mg/dL   GFR calc non Af Amer 18 (*) >90 mL/min   GFR calc Af Amer 21 (*) >90 mL/min  URINALYSIS, ROUTINE W REFLEX MICROSCOPIC      Component Value Range   Color, Urine YELLOW  YELLOW   APPearance CLOUDY (*) CLEAR   Specific Gravity, Urine 1.016  1.005 - 1.030   pH 6.5  5.0 - 8.0   Glucose, UA NEGATIVE  NEGATIVE mg/dL   Hgb urine dipstick NEGATIVE  NEGATIVE   Bilirubin Urine NEGATIVE  NEGATIVE   Ketones, ur NEGATIVE  NEGATIVE mg/dL   Protein, ur 914 (*) NEGATIVE mg/dL   Urobilinogen, UA 0.2  0.0 - 1.0 mg/dL   Nitrite NEGATIVE  NEGATIVE   Leukocytes, UA NEGATIVE  NEGATIVE  URINE MICROSCOPIC-ADD ON      Component Value Range   Squamous Epithelial / LPF FEW (*) RARE   WBC, UA 0-2  <3 WBC/hpf   Bacteria, UA RARE  RARE   Casts HYALINE CASTS (*) NEGATIVE   Urine-Other MUCOUS PRESENT      Date: 06/15/2012  Rate: 83  Rhythm: NSR  QRS Axis: Normal  Intervals: [place]  ST/T Wave abnormalities: None  Conduction Disutrbances: None  Narrative Interpretation: NSR   Old EKG Reviewed: None for comparison    1. Back pain   2. Dehydration   3. Hypercalcemia       MDM  Patient with history of low back pain now for several months. Worse in the last 3-4 days. Has been followed by the [facility] was scheduled to have an [place]. The patient has not been able to get around at home without severe pain when she's up on her feet pain she has incontinence there is no focal deficits to the lower trimming these. No incontinence when she is lying flat. X-rays of her back in the past shown significant degenerative changes. Her husband and her prefer admission because they're unable to take care of her at home also the pain is not well controlled she denied eating or drinking well. Symptoms are not consistent with a cauda equina syndrome.  Workup in the emergency department shows significant increase in BUN and creatinine consistent probably with dehydration. Patient given of  IV normal saline boluses 250 cc x3 and continued maintenance fluid. Discuss with the hospitalist they will admit.   Original plan for admission was to a MedSurg bed with the elevation in calcium which may be due also in part to dehydration probably best to do a telemetry admission. EKG will be ordered. They were concerned about the elevated calcium and may represent a neoplastic process related to deterioration of the lower part of the back. Myeloma is a possibility.        Shelda Jakes, MD 04/04/12 [id number]  Shelda Jakes, MD 04/04/12 1107  Shelda Jakes, MD 06/15/12 2146

## 2012-04-04 NOTE — ED Notes (Signed)
Report called to Debbie, RN.

## 2012-04-05 ENCOUNTER — Inpatient Hospital Stay (HOSPITAL_COMMUNITY): Payer: Medicare Other

## 2012-04-05 ENCOUNTER — Other Ambulatory Visit: Payer: Self-pay | Admitting: Radiology

## 2012-04-05 DIAGNOSIS — I10 Essential (primary) hypertension: Secondary | ICD-10-CM

## 2012-04-05 LAB — PTH, INTACT AND CALCIUM: Calcium, Total (PTH): 11.8 mg/dL — ABNORMAL HIGH (ref 8.4–10.5)

## 2012-04-05 LAB — BASIC METABOLIC PANEL
CO2: 21 mEq/L (ref 19–32)
Calcium: 11.6 mg/dL — ABNORMAL HIGH (ref 8.4–10.5)
Glucose, Bld: 110 mg/dL — ABNORMAL HIGH (ref 70–99)
Potassium: 4.4 mEq/L (ref 3.5–5.1)
Sodium: 136 mEq/L (ref 135–145)

## 2012-04-05 LAB — KAPPA/LAMBDA LIGHT CHAINS: Kappa, lambda light chain ratio: 3.06 — ABNORMAL HIGH (ref 0.26–1.65)

## 2012-04-05 LAB — IGG, IGA, IGM
IgA: 3880 mg/dL — ABNORMAL HIGH (ref 69–380)
IgG (Immunoglobin G), Serum: 379 mg/dL — ABNORMAL LOW (ref 690–1700)

## 2012-04-05 MED ORDER — METOCLOPRAMIDE HCL 5 MG PO TABS
5.0000 mg | ORAL_TABLET | Freq: Three times a day (TID) | ORAL | Status: DC
  Administered 2012-04-05 – 2012-04-12 (×19): 5 mg via ORAL
  Filled 2012-04-05 (×26): qty 1

## 2012-04-05 NOTE — Evaluation (Signed)
Occupational Therapy Evaluation Patient Details Name: [name] MRN: [id number] DOB: 1935/11/12 Today's Date: 04/05/2012 Time: 4098-1191 OT Time Calculation (min): 24 min  OT Assessment / Plan / Recommendation Clinical Impression  Pt with History of back pain for 3 months. Pt has also had some lower extremities weakness for past 6 weeks and urinary incontinence for about 3 months. She feels that weakness is getting worse. Also endorses history of bilateral hip pain. She is no longer able to walk without help. Pt presents with increased pain and weakness and will benefit from skilled OT services to improve ADL independence to return home with husband assisting.     OT Assessment  Patient needs continued OT Services    Follow Up Recommendations  Home health OT    Barriers to Discharge      Equipment Recommendations  None recommended by OT    Recommendations for Other Services    Frequency  Min 2X/week    Precautions / Restrictions Precautions Precautions: Fall Restrictions Weight Bearing Restrictions: No        ADL  Eating/Feeding: Simulated;Independent Where Assessed - Eating/Feeding: Chair Grooming: Simulated;Set up Where Assessed - Grooming: Supported sitting Upper Body Bathing: Simulated;Chest;Right arm;Left arm;Abdomen;Set up;Supervision/safety Where Assessed - Upper Body Bathing: Unsupported sitting Lower Body Bathing: Simulated;Minimal assistance Where Assessed - Lower Body Bathing: Supported sit to stand Upper Body Dressing: Simulated;Minimal assistance Where Assessed - Upper Body Dressing: Unsupported sitting Lower Body Dressing: Simulated;Minimal assistance Where Assessed - Lower Body Dressing: Supported sit to stand Toilet Transfer: Performed;Minimal assistance Toilet Transfer Method: Other (comment) (ambulating) Toilet Transfer Equipment: Raised toilet seat with arms (or 3-in-1 over toilet) Toileting - Clothing Manipulation and Hygiene: Simulated;Minimal  assistance Where Assessed - Engineer, mining and Hygiene: Standing Equipment Used: Rolling walker ADL Comments: Family present for eval. Pt slow to respond to commands and needs moderate verbal, tactile and demo cues to complete tasks and use RW safely. (ie instructed pt to push up from 3in1 armrests but pt needed tactile cues to follow). Pt does fatigue easily with activity also. Husband has been assisting with ADl for about a week or so while pt has been sick.     OT Diagnosis:    OT Problem List: Decreased strength;Decreased knowledge of use of DME or AE;Pain;Decreased activity tolerance OT Treatment Interventions: Self-care/ADL training;Therapeutic activities;DME and/or AE instruction;Patient/family education   OT Goals Acute Rehab OT Goals OT Goal Formulation: With patient Time For Goal Achievement: 04/19/12 Potential to Achieve Goals: Good ADL Goals Pt Will Perform Grooming: with supervision;Standing at sink ADL Goal: Grooming - Progress: Goal set today Pt Will Perform Lower Body Bathing: with supervision;Sit to stand from chair;Sit to stand from bed ADL Goal: Lower Body Bathing - Progress: Goal set today Pt Will Perform Lower Body Dressing: with supervision;Sit to stand from chair;Sit to stand from bed ADL Goal: Lower Body Dressing - Progress: Goal set today Pt Will Transfer to Toilet: with supervision;Ambulation;Comfort height toilet;Grab bars ADL Goal: Toilet Transfer - Progress: Goal set today Pt Will Perform Toileting - Clothing Manipulation: with supervision;Standing ADL Goal: Toileting - Clothing Manipulation - Progress: Goal set today Additional ADL Goal #1: Pt will complete basic ADl with min verbal cues only ADL Goal: Additional Goal #1 - Progress: Goal set today  Visit Information  Last OT Received On: 04/05/12 Assistance Needed: +1 PT/OT Co-Evaluation/Treatment: Yes    Subjective Data  Subjective: do I have to? (when requested to get up with  therapy) Patient Stated Goal: none stated. agreeable  to work with PT/OT   Prior Functioning  Home Living Lives With: Spouse Available Help at Discharge: Family Type of Home: Other (Comment) (senior apartments) Firefighter: Handicapped height (grab bars both sides) Home Adaptive Equipment: Wheelchair - manual Prior Function Level of Independence: Needs assistance Needs Assistance: Bathing;Dressing;Toileting;Meal Prep;Light Housekeeping;Gait Bath: Moderate Dressing: Moderate Toileting: Minimal Meal Prep: Total Light Housekeeping: Total Gait Assistance: hand held assist by husband 24/7 for the last week or so Communication Communication: No difficulties    Cognition  Overall Cognitive Status:  (slow to respond to commands at times) Arousal/Alertness: Awake/alert Orientation Level: Other (comment) (with increased time, named place) Behavior During Session: [facility] for tasks performed    Extremity/Trunk Assessment Right Upper Extremity Assessment RUE ROM/Strength/Tone: [facility] for tasks assessed Left Upper Extremity Assessment LUE ROM/Strength/Tone: [facility] for tasks assessed   Mobility Bed Mobility Bed Mobility: Supine to Sit Supine to Sit: 5: Supervision;With rails;HOB elevated Transfers Transfers: Sit to Stand;Stand to Sit Sit to Stand: 4: Min assist;From bed;From chair/3-in-1;With upper extremity assist Stand to Sit: 4: Min assist;To chair/3-in-1;With upper extremity assist Details for Transfer Assistance: multimodal cues for hand placement to help with pushing up/reaching back to sit   Exercise    Balance    End of Session OT - End of Session Activity Tolerance: Patient limited by fatigue Patient left: in chair;with call bell/phone within reach;with family/visitor present  GO     Lennox Laity [phone] 04/05/2012, 11:26 AM

## 2012-04-05 NOTE — Interval H&P Note (Cosign Needed)
History and Physical Interval Note:  04/05/2012 9:33 AM  [name]  Is scheduled for CT guided bone marrow biopsy on 7/10 to r/o multiple myeloma. Pt has hx of hypercalcemia, proteinuria, anemia and numerous painful lytic bony lesions. The various methods of treatment have been discussed with the patient and family. After consideration of risks, benefits and other options for treatment, the patient has consented to  the above procedure.  The patient's history has been reviewed, patient examined, no change in status, stable for the above procedure. Chest -CTA bilat, heart- RRR, abd.- soft,+BS,NT.  I have reviewed the patients' chart and labs.  Questions were answered to the patient's satisfaction.   Past Medical History  Diagnosis Date  . Anxiety   . Hypertension   . Hyperlipidemia   . Depression   . Osteopenia    Past Surgical History  Procedure Date  . Breast lumpectomy    Dg Orthopantogram  04/04/2012  *RADIOLOGY REPORT*  Clinical Data: 76 year old female.  Dental Pathology.  ORTHOPANTOGRAM/PANORAMIC  Comparison: None.  Findings: Residual dentition (incisors, canine, and left maxillary molar) demonstrate filling with no definite dental caries.  No peri apical lucency.  The remaining dentition is absent.  IMPRESSION: Dental fillings in the residual dentition.  No definite dental caries or peri apical lucency.  Original Report Authenticated By: Harley Hallmark, M.D.   X-ray Chest Pa And Lateral   04/04/2012  *RADIOLOGY REPORT*  Clinical Data: 76 year old female with cough shortness of breath chest pain.  CHEST - 2 VIEW  Comparison: 04/11/2007.  Lumbar MRI from 04/04/2012.  Findings: Upright AP and lateral views of the chest.  There is a peripheral right lung 6 cm mass-like opacity.  This may be pleural based or might be extrapleural.  Mediastinal and hilar contours appear stable and within normal limits. The anterior ribs in the region of the mass are indistinct.  The left lung appears stable  and clear. Visualized tracheal air column is within normal limits.  No definite other osseous lesion identified.  IMPRESSION: 1.  Mass-like opacity in the right hemithorax, may be pleural based or could be extrapleural (indeterminate adjacent rib involvement). In light of the lumbar findings earlier today, top differential considerations are multiple myeloma versus bronchogenic carcinoma of the right chest.  Chest CT (IV contrast preferred) would characterize further. 2.  No other cardiopulmonary abnormality identified.  Original Report Authenticated By: Harley Hallmark, M.D.   Dg Lumbar Spine 2-3 Views  03/19/2012  *RADIOLOGY REPORT*  Clinical Data: Low back pain  LUMBAR SPINE - 2-3 VIEW  Comparison: 10/05/2008  Findings: Severe narrowing of the L2-3 and L3-4 discs.  Moderate narrowing at L4-5.  Minimal retrolisthesis L3 upon L4 unchanged. Levoscoliosis with the apex at L2-3.  No vertebral compression deformity.  Osteopenia.  Lower lumbar facet arthropathy.  IMPRESSION: Chronic and degenerative changes.  No acute bony pathology.  Original Report Authenticated By: Donavan Burnet, M.D.   Dg Hip Bilateral Vito Berger  04/04/2012  *RADIOLOGY REPORT*  Clinical Data: 76 year old female with pain.  Abnormal lumbar MRI.  BILATERAL HIP WITH PELVIS - 4+ VIEW  Comparison: Lumbar MRI from the same day reported separately. Lumbar radiographs 03/19/2012.  Findings: A subtle but asymmetric decreased bone mineralization of the proximal right femur.  A small (3-4 mm) and lucent lesions are noted in both intertrochanteric regions.  No proximal femoral pathologic fracture is identified.  There may be a small lucent lesions of the left iliac crest versus overlying bowel gas.  No  pelvic fracture is identified.  Bone mineralization in the pelvis is similar to that seen in the visible lumbar spine.  IMPRESSION: Decreased bone mineralization in the pelvis and proximal femurs similar to that in the lumbar spine with possible superimposed  small lucent lesions, but no pathologic fracture identified. Appearance compatible with widespread multiple myeloma (favored) or bone metastases.  Original Report Authenticated By: Harley Hallmark, M.D.   Mr Lumbar Spine Wo Contrast  04/04/2012  *RADIOLOGY REPORT*  Clinical Data: Low back pain and lower extremity weakness.  MRI LUMBAR SPINE WITHOUT CONTRAST  Technique:  Multiplanar and multiecho pulse sequences of the lumbar spine were obtained without intravenous contrast.  Comparison: Radiographs dated 03/19/2012 and CT scan of the abdomen dated 10/05/2008  Findings: The scan extends from T11-12 through S3.  Tip of the conus is at L1 and appears normal.  There are innumerable lesions throughout the visualized bones. There is a pathologic Schmorl's node in the inferior aspect of L3. The appearance is most typical for multiple myeloma.  T11-12:  The disc is normal.  T12-L1:  There is a Schmorl's node in the inferior endplate of T12 which may be related to the numerous lesions.  No disc bulging.  L1-2:  Small annular disc tear asymmetric bulge of the left of midline with no neural impingement.  L2-3:  Broad-based disc bulge with a small protrusion into the right neural foramen.  Right L2 nerve exits without impingement. Slight narrowing of the spinal canal and right lateral recess without focal neural impingement.  L3-4: There is a 5 mm retrolisthesis of L3 on L4 with broad-based bulge of the disc narrowing the spinal canal slightly without focal neural impingement.  L4-5:  Slight disc space narrowing with a tiny broad-based disc bulge with osteophyte formation and slight hypertrophy of the left facet joint and ligamentum flavum.  L5-S1:  The disc is normal.  Moderate right and mild left facet arthritis.  There are numerous lesions in the sacrum.  Incidental note is made of a 2.7 cm gallstone.  Gallbladder wall is not thickened.  IMPRESSION: 1.  There are innumerable lesions throughout the entire visualized portion  of the skeleton, most likely multiple myeloma. Metastatic cancer can give the same appearance. 2.  Probable pathologic Schmorl's nodes in the inferior aspects of T12 and L3. 3.  No significant neural impingement in the lumbar spine.  Critical Value/emergent results were called by telephone at the time of interpretation on 04/04/2012  at 12:19 p.m.  to  Dr. Vassie Loll, who verbally acknowledged these results.  Original Report Authenticated By: Gwynn Burly, M.D.   US Renal  04/04/2012  *RADIOLOGY REPORT*  Clinical Data: Renal failure.  Hypertension.  RENAL/URINARY TRACT ULTRASOUND COMPLETE  Comparison:  CT of 10/05/2008  Findings:  Right Kidney:  10.6 cm. No hydronephrosis.  Normal renal cortical thickness and echogenicity.  Left Kidney:  11.1 cm. No hydronephrosis.  Normal renal cortical thickness and echogenicity.  9 mm cyst or minimally complex cyst in the lower pole left kidney.  Bladder:  Within normal limits.  Incidental note is made of a 2.5 cm gallstone, without wall thickening or pericholecystic fluid.  IMPRESSION:  1. No acute process or explanation for renal failure. 2.  Cholelithiasis.  Original Report Authenticated By: Consuello Bossier, M.D.   Dg Bone Density  03/16/2012  *RADIOLOGY REPORT*  Clinical Data: 76 year old postmenopausal female with history of low bone mass.  The patient takes vitamin D.  DUAL X-RAY ABSORPTIOMETRY (DXA) FOR  BONE MINERAL DENSITY  AP LUMBAR SPINE (L1 - L4)  Bone Mineral Density (BMD):            0.869 g/cm2 Young Adult T Score:                          -1.6 Z Score:                                                0.7  LEFT FEMUR (NECK)  Bone Mineral Density (BMD):             0.641 g/cm2 Young Adult T Score:                           -1.9 Z Score:                                                 0.2  ASSESSMENT:  Patient's diagnostic category is LOW BONE MASS by WHO Criteria.  FRACTURE RISK: MODERATE  FRAX: Based on the World Health Organization FRAX model, the 10 year  probability of a major osteoporotic fracture is 12%.  The 10 year probability of a hip fracture is 2.6%.  Comparison: There has been a statistically significant 3.6% increase in BMD in the spine (likely reflecting increasing degenerative change) and a statistically significant 7.2% decrease in BMD in the total left hip as compared to 11/05/2008.  RECOMMENDATIONS:  Effective therapies are available in the form of bisphosphonates, selective estrogen receptor modulators, biologic agents, and hormone replacement therapy (for women).  All patients should ensure an adequate intake of dietary calcium (1200mg  daily) and vitamin D (800 IU daily) unless contraindicated.  All treatment decisions require clinical judgement and consideration of individual patient factors, including patient preferences, co-morbidities, previous drug use, risk factors not captured in the FRAX model (e.g., frailty, falls, vitamin D deficiency, increased bone turnover, interval significant decline in bone density) and possible under-or over-estimation of fracture risk by FRAX.  The National Osteoporosis Foundation recommends that FDA-approved medical therapies be considered in postmenopausal women and mean age 20 or older with a:        1)     Hip or vertebral (clinical or morphometric) fracture.           2)    T-score of -2.5 or lower at the spine or hip. 3)    Ten-year fracture probability by FRAX of 3% or greater for hip fracture or 20% or greater for major osteoporotic fracture. FOLLOW-UP:  People with diagnosed cases of osteoporosis or at high risk for fracture should have regular bone mineral density tests.  For patients eligible for Medicare, routine testing is allowed once every 2 years.  The testing frequency can be increased to one year for patients who have rapidly progressing disease, those who are receiving or discontinuing medical therapy to restore bone mass, or have additional risk factors.  World Science writer [facility]) Criteria:   Normal: T scores from +1.0 to -1.0 Low Bone Mass (Osteopenia): T scores between -1.0 and -2.5 Osteoporosis: T scores -2.5 and below  Comparison to Reference Population:  T score is the key measure used  in the diagnosis of osteoporosis and relative risk determination for fracture.  It provides a value for bone mass relative to the mean bone mass of a young adult reference population expressed in terms of standard deviation (SD).  Z score is the age-matched score showing the patient's values compared to a population matched for age, sex, and race.  This is also expressed in terms of standard deviation.  The patient may have values that compare favorably to the age-matched values and still be at increased risk for fracture.  Original Report Authenticated By: Daryl Eastern, M.D.  Results for orders placed during the hospital encounter of 04/03/12  CBC WITH DIFFERENTIAL      Component Value Range   WBC 8.6  4.0 - 10.5 K/uL   RBC 3.42 (*) 3.87 - 5.11 MIL/uL   Hemoglobin 10.8 (*) 12.0 - 15.0 g/dL   HCT 16.1 (*) 09.6 - 04.5 %   MCV 91.8  78.0 - 100.0 fL   MCH 31.6  26.0 - 34.0 pg   MCHC 34.4  30.0 - 36.0 g/dL   RDW 40.9  81.1 - 91.4 %   Platelets 203  150 - 400 K/uL   Neutrophils Relative 62  43 - 77 %   Lymphocytes Relative 26  12 - 46 %   Monocytes Relative 8  3 - 12 %   Eosinophils Relative 3  0 - 5 %   Basophils Relative 1  0 - 1 %   Neutro Abs 5.3  1.7 - 7.7 K/uL   Lymphs Abs 2.2  0.7 - 4.0 K/uL   Monocytes Absolute 0.7  0.1 - 1.0 K/uL   Eosinophils Absolute 0.3  0.0 - 0.7 K/uL   Basophils Absolute 0.1  0.0 - 0.1 K/uL   WBC Morphology INCREASED BANDS (>20% BANDS)    BASIC METABOLIC PANEL      Component Value Range   Sodium 135  135 - 145 mEq/L   Potassium 4.9  3.5 - 5.1 mEq/L   Chloride 96  96 - 112 mEq/L   CO2 24  19 - 32 mEq/L   Glucose, Bld 112 (*) 70 - 99 mg/dL   BUN 32 (*) 6 - 23 mg/dL   Creatinine, Ser 7.82 (*) 0.50 - 1.10 mg/dL   Calcium 95.6 (*) 8.4 - 10.5 mg/dL   GFR  calc non Af Amer 18 (*) >90 mL/min   GFR calc Af Amer 21 (*) >90 mL/min  URINALYSIS, ROUTINE W REFLEX MICROSCOPIC      Component Value Range   Color, Urine YELLOW  YELLOW   APPearance CLOUDY (*) CLEAR   Specific Gravity, Urine 1.016  1.005 - 1.030   pH 6.5  5.0 - 8.0   Glucose, UA NEGATIVE  NEGATIVE mg/dL   Hgb urine dipstick NEGATIVE  NEGATIVE   Bilirubin Urine NEGATIVE  NEGATIVE   Ketones, ur NEGATIVE  NEGATIVE mg/dL   Protein, ur 213 (*) NEGATIVE mg/dL   Urobilinogen, UA 0.2  0.0 - 1.0 mg/dL   Nitrite NEGATIVE  NEGATIVE   Leukocytes, UA NEGATIVE  NEGATIVE  URINE MICROSCOPIC-ADD ON      Component Value Range   Squamous Epithelial / LPF FEW (*) RARE   WBC, UA 0-2  <3 WBC/hpf   Bacteria, UA RARE  RARE   Casts HYALINE CASTS (*) NEGATIVE   Urine-Other MUCOUS PRESENT    MAGNESIUM      Component Value Range   Magnesium 2.5  1.5 - 2.5 mg/dL  PHOSPHORUS  Component Value Range   Phosphorus 5.1 (*) 2.3 - 4.6 mg/dL  TSH      Component Value Range   TSH 1.662  0.350 - 4.500 uIU/mL  COMPREHENSIVE METABOLIC PANEL      Component Value Range   Sodium 135  135 - 145 mEq/L   Potassium 4.8  3.5 - 5.1 mEq/L   Chloride 96  96 - 112 mEq/L   CO2 23  19 - 32 mEq/L   Glucose, Bld 115 (*) 70 - 99 mg/dL   BUN 29 (*) 6 - 23 mg/dL   Creatinine, Ser 1.61 (*) 0.50 - 1.10 mg/dL   Calcium 09.6 (*) 8.4 - 10.5 mg/dL   Total Protein 9.7 (*) 6.0 - 8.3 g/dL   Albumin 2.7 (*) 3.5 - 5.2 g/dL   AST 16  0 - 37 U/L   ALT 11  0 - 35 U/L   Alkaline Phosphatase 60  39 - 117 U/L   Total Bilirubin 0.2 (*) 0.3 - 1.2 mg/dL   GFR calc non Af Amer 19 (*) >90 mL/min   GFR calc Af Amer 22 (*) >90 mL/min  CBC      Component Value Range   WBC 7.4  4.0 - 10.5 K/uL   RBC 3.42 (*) 3.87 - 5.11 MIL/uL   Hemoglobin 10.8 (*) 12.0 - 15.0 g/dL   HCT 04.5 (*) 40.9 - 81.1 %   MCV 92.4  78.0 - 100.0 fL   MCH 31.6  26.0 - 34.0 pg   MCHC 34.2  30.0 - 36.0 g/dL   RDW 91.4  78.2 - 95.6 %   Platelets 185  150 - 400 K/uL    VITAMIN D 25 HYDROXY      Component Value Range   Vit D, 25-Hydroxy 29 (*) 30 - 89 ng/mL  VITAMIN B12      Component Value Range   Vitamin B-12 1696 (*) 211 - 911 pg/mL  FOLATE      Component Value Range   Folate 10.6    IRON AND TIBC      Component Value Range   Iron 67  42 - 135 ug/dL   TIBC 213  086 - 578 ug/dL   Saturation Ratios 26  20 - 55 %   UIBC 195  125 - 400 ug/dL  FERRITIN      Component Value Range   Ferritin 50  10 - 291 ng/mL  RETICULOCYTES      Component Value Range   Retic Ct Pct 0.8  0.4 - 3.1 %   RBC. 3.42 (*) 3.87 - 5.11 MIL/uL   Retic Count, Manual 27.4  19.0 - 186.0 K/uL  SODIUM, URINE, RANDOM      Component Value Range   Sodium, Ur 132    CREATININE, URINE, RANDOM      Component Value Range   Creatinine, Urine 64.6    IGG, IGA, IGM      Component Value Range   IgG (Immunoglobin G), Serum 379 (*) 690 - 1700 mg/dL   IgA 4696 (*) 69 - 295 mg/dL   IgM, Serum 5 (*) 52 - 322 mg/dL  BASIC METABOLIC PANEL      Component Value Range   Sodium 136  135 - 145 mEq/L   Potassium 4.4  3.5 - 5.1 mEq/L   Chloride 100  96 - 112 mEq/L   CO2 21  19 - 32 mEq/L   Glucose, Bld 110 (*) 70 - 99 mg/dL   BUN  21  6 - 23 mg/dL   Creatinine, Ser 9.56 (*) 0.50 - 1.10 mg/dL   Calcium 21.3 (*) 8.4 - 10.5 mg/dL   GFR calc non Af Amer 25 (*) >90 mL/min   GFR calc Af Amer 29 (*) >90 mL/min   A/P: Pt with history of hypercalcemia, proteinuria, anemia, and numerous painful lytic bony lesions; plan is for CT guided bone marrow biopsy on 7/10. Pt ate this morning and lab technicians are only available in am to process marrow samples, therefore procedure scheduled for 7/10. Details/risks of procedure d/w pt/family with their understanding and consent.  Chinita Pester

## 2012-04-05 NOTE — Interval H&P Note (Cosign Needed)
History and Physical Interval Note:  04/05/2012 2:26 PM  [name] is scheduled for CT guided bone marrow biopsy on 7/10 to r/o multiple myeloma. Pt has hx of hypercalcemia, acute renal failure, proteinuria, anemia and numerous painful lytic bony lesions. The various methods of treatment have been discussed with the patient and family. After consideration of risks, benefits and other options for treatment, the patient/family has consented to the above procedure  .  The patient's history has been reviewed, patient examined, no change in status, stable for  the above procedure. Chest- CTA bilat., heart-- RRR, abd.- soft, +BS,NT.  I have reviewed the patients' chart and labs.  Questions were answered to the patient's/family's satisfaction.   Past Medical History  Diagnosis Date  . Anxiety   . Hypertension   . Hyperlipidemia   . Depression   . Osteopenia    Past Surgical History  Procedure Date  . Breast lumpectomy    Dg Orthopantogram  04/04/2012  *RADIOLOGY REPORT*  Clinical Data: 76 year old female.  Dental Pathology.  ORTHOPANTOGRAM/PANORAMIC  Comparison: None.  Findings: Residual dentition (incisors, canine, and left maxillary molar) demonstrate filling with no definite dental caries.  No peri apical lucency.  The remaining dentition is absent.  IMPRESSION: Dental fillings in the residual dentition.  No definite dental caries or peri apical lucency.  Original Report Authenticated By: Harley Hallmark, M.D.   X-ray Chest Pa And Lateral   04/04/2012  *RADIOLOGY REPORT*  Clinical Data: 76 year old female with cough shortness of breath chest pain.  CHEST - 2 VIEW  Comparison: 04/11/2007.  Lumbar MRI from 04/04/2012.  Findings: Upright AP and lateral views of the chest.  There is a peripheral right lung 6 cm mass-like opacity.  This may be pleural based or might be extrapleural.  Mediastinal and hilar contours appear stable and within normal limits. The anterior ribs in the region of the mass are  indistinct.  The left lung appears stable and clear. Visualized tracheal air column is within normal limits.  No definite other osseous lesion identified.  IMPRESSION: 1.  Mass-like opacity in the right hemithorax, may be pleural based or could be extrapleural (indeterminate adjacent rib involvement). In light of the lumbar findings earlier today, top differential considerations are multiple myeloma versus bronchogenic carcinoma of the right chest.  Chest CT (IV contrast preferred) would characterize further. 2.  No other cardiopulmonary abnormality identified.  Original Report Authenticated By: Harley Hallmark, M.D.   Dg Lumbar Spine 2-3 Views  03/19/2012  *RADIOLOGY REPORT*  Clinical Data: Low back pain  LUMBAR SPINE - 2-3 VIEW  Comparison: 10/05/2008  Findings: Severe narrowing of the L2-3 and L3-4 discs.  Moderate narrowing at L4-5.  Minimal retrolisthesis L3 upon L4 unchanged. Levoscoliosis with the apex at L2-3.  No vertebral compression deformity.  Osteopenia.  Lower lumbar facet arthropathy.  IMPRESSION: Chronic and degenerative changes.  No acute bony pathology.  Original Report Authenticated By: Donavan Burnet, M.D.   Dg Hip Bilateral Vito Berger  04/04/2012  *RADIOLOGY REPORT*  Clinical Data: 76 year old female with pain.  Abnormal lumbar MRI.  BILATERAL HIP WITH PELVIS - 4+ VIEW  Comparison: Lumbar MRI from the same day reported separately. Lumbar radiographs 03/19/2012.  Findings: A subtle but asymmetric decreased bone mineralization of the proximal right femur.  A small (3-4 mm) and lucent lesions are noted in both intertrochanteric regions.  No proximal femoral pathologic fracture is identified.  There may be a small lucent lesions of the left iliac crest versus  overlying bowel gas.  No pelvic fracture is identified.  Bone mineralization in the pelvis is similar to that seen in the visible lumbar spine.  IMPRESSION: Decreased bone mineralization in the pelvis and proximal femurs similar to that in  the lumbar spine with possible superimposed small lucent lesions, but no pathologic fracture identified. Appearance compatible with widespread multiple myeloma (favored) or bone metastases.  Original Report Authenticated By: Harley Hallmark, M.D.   Mr Lumbar Spine Wo Contrast  04/04/2012  *RADIOLOGY REPORT*  Clinical Data: Low back pain and lower extremity weakness.  MRI LUMBAR SPINE WITHOUT CONTRAST  Technique:  Multiplanar and multiecho pulse sequences of the lumbar spine were obtained without intravenous contrast.  Comparison: Radiographs dated 03/19/2012 and CT scan of the abdomen dated 10/05/2008  Findings: The scan extends from T11-12 through S3.  Tip of the conus is at L1 and appears normal.  There are innumerable lesions throughout the visualized bones. There is a pathologic Schmorl's node in the inferior aspect of L3. The appearance is most typical for multiple myeloma.  T11-12:  The disc is normal.  T12-L1:  There is a Schmorl's node in the inferior endplate of T12 which may be related to the numerous lesions.  No disc bulging.  L1-2:  Small annular disc tear asymmetric bulge of the left of midline with no neural impingement.  L2-3:  Broad-based disc bulge with a small protrusion into the right neural foramen.  Right L2 nerve exits without impingement. Slight narrowing of the spinal canal and right lateral recess without focal neural impingement.  L3-4: There is a 5 mm retrolisthesis of L3 on L4 with broad-based bulge of the disc narrowing the spinal canal slightly without focal neural impingement.  L4-5:  Slight disc space narrowing with a tiny broad-based disc bulge with osteophyte formation and slight hypertrophy of the left facet joint and ligamentum flavum.  L5-S1:  The disc is normal.  Moderate right and mild left facet arthritis.  There are numerous lesions in the sacrum.  Incidental note is made of a 2.7 cm gallstone.  Gallbladder wall is not thickened.  IMPRESSION: 1.  There are innumerable  lesions throughout the entire visualized portion of the skeleton, most likely multiple myeloma. Metastatic cancer can give the same appearance. 2.  Probable pathologic Schmorl's nodes in the inferior aspects of T12 and L3. 3.  No significant neural impingement in the lumbar spine.  Critical Value/emergent results were called by telephone at the time of interpretation on 04/04/2012  at 12:19 p.m.  to  Dr. Vassie Loll, who verbally acknowledged these results.  Original Report Authenticated By: Gwynn Burly, M.D.   US Renal  04/04/2012  *RADIOLOGY REPORT*  Clinical Data: Renal failure.  Hypertension.  RENAL/URINARY TRACT ULTRASOUND COMPLETE  Comparison:  CT of 10/05/2008  Findings:  Right Kidney:  10.6 cm. No hydronephrosis.  Normal renal cortical thickness and echogenicity.  Left Kidney:  11.1 cm. No hydronephrosis.  Normal renal cortical thickness and echogenicity.  9 mm cyst or minimally complex cyst in the lower pole left kidney.  Bladder:  Within normal limits.  Incidental note is made of a 2.5 cm gallstone, without wall thickening or pericholecystic fluid.  IMPRESSION:  1. No acute process or explanation for renal failure. 2.  Cholelithiasis.  Original Report Authenticated By: Consuello Bossier, M.D.   Dg Bone Density  03/16/2012  *RADIOLOGY REPORT*  Clinical Data: 76 year old postmenopausal female with history of low bone mass.  The patient takes vitamin D.  DUAL X-RAY ABSORPTIOMETRY (DXA) FOR BONE MINERAL DENSITY  AP LUMBAR SPINE (L1 - L4)  Bone Mineral Density (BMD):            0.869 g/cm2 Young Adult T Score:                          -1.6 Z Score:                                                0.7  LEFT FEMUR (NECK)  Bone Mineral Density (BMD):             0.641 g/cm2 Young Adult T Score:                           -1.9 Z Score:                                                 0.2  ASSESSMENT:  Patient's diagnostic category is LOW BONE MASS by WHO Criteria.  FRACTURE RISK: MODERATE  FRAX: Based on the  World Health Organization FRAX model, the 10 year probability of a major osteoporotic fracture is 12%.  The 10 year probability of a hip fracture is 2.6%.  Comparison: There has been a statistically significant 3.6% increase in BMD in the spine (likely reflecting increasing degenerative change) and a statistically significant 7.2% decrease in BMD in the total left hip as compared to 11/05/2008.  RECOMMENDATIONS:  Effective therapies are available in the form of bisphosphonates, selective estrogen receptor modulators, biologic agents, and hormone replacement therapy (for women).  All patients should ensure an adequate intake of dietary calcium (1200mg  daily) and vitamin D (800 IU daily) unless contraindicated.  All treatment decisions require clinical judgement and consideration of individual patient factors, including patient preferences, co-morbidities, previous drug use, risk factors not captured in the FRAX model (e.g., frailty, falls, vitamin D deficiency, increased bone turnover, interval significant decline in bone density) and possible under-or over-estimation of fracture risk by FRAX.  The National Osteoporosis Foundation recommends that FDA-approved medical therapies be considered in postmenopausal women and mean age 78 or older with a:        1)     Hip or vertebral (clinical or morphometric) fracture.           2)    T-score of -2.5 or lower at the spine or hip. 3)    Ten-year fracture probability by FRAX of 3% or greater for hip fracture or 20% or greater for major osteoporotic fracture. FOLLOW-UP:  People with diagnosed cases of osteoporosis or at high risk for fracture should have regular bone mineral density tests.  For patients eligible for Medicare, routine testing is allowed once every 2 years.  The testing frequency can be increased to one year for patients who have rapidly progressing disease, those who are receiving or discontinuing medical therapy to restore bone mass, or have additional risk  factors.  World Health Organization [facility]) Criteria:  Normal: T scores from +1.0 to -1.0 Low Bone Mass (Osteopenia): T scores between -1.0 and -2.5 Osteoporosis: T scores -2.5 and below  Comparison to Reference Population:  T score  is the key measure used in the diagnosis of osteoporosis and relative risk determination for fracture.  It provides a value for bone mass relative to the mean bone mass of a young adult reference population expressed in terms of standard deviation (SD).  Z score is the age-matched score showing the patient's values compared to a population matched for age, sex, and race.  This is also expressed in terms of standard deviation.  The patient may have values that compare favorably to the age-matched values and still be at increased risk for fracture.  Original Report Authenticated By: Daryl Eastern, M.D.  Results for orders placed during the hospital encounter of 04/03/12  CBC WITH DIFFERENTIAL      Component Value Range   WBC 8.6  4.0 - 10.5 K/uL   RBC 3.42 (*) 3.87 - 5.11 MIL/uL   Hemoglobin 10.8 (*) 12.0 - 15.0 g/dL   HCT 16.1 (*) 09.6 - 04.5 %   MCV 91.8  78.0 - 100.0 fL   MCH 31.6  26.0 - 34.0 pg   MCHC 34.4  30.0 - 36.0 g/dL   RDW 40.9  81.1 - 91.4 %   Platelets 203  150 - 400 K/uL   Neutrophils Relative 62  43 - 77 %   Lymphocytes Relative 26  12 - 46 %   Monocytes Relative 8  3 - 12 %   Eosinophils Relative 3  0 - 5 %   Basophils Relative 1  0 - 1 %   Neutro Abs 5.3  1.7 - 7.7 K/uL   Lymphs Abs 2.2  0.7 - 4.0 K/uL   Monocytes Absolute 0.7  0.1 - 1.0 K/uL   Eosinophils Absolute 0.3  0.0 - 0.7 K/uL   Basophils Absolute 0.1  0.0 - 0.1 K/uL   WBC Morphology INCREASED BANDS (>20% BANDS)    BASIC METABOLIC PANEL      Component Value Range   Sodium 135  135 - 145 mEq/L   Potassium 4.9  3.5 - 5.1 mEq/L   Chloride 96  96 - 112 mEq/L   CO2 24  19 - 32 mEq/L   Glucose, Bld 112 (*) 70 - 99 mg/dL   BUN 32 (*) 6 - 23 mg/dL   Creatinine, Ser 7.82 (*) 0.50 - 1.10  mg/dL   Calcium 95.6 (*) 8.4 - 10.5 mg/dL   GFR calc non Af Amer 18 (*) >90 mL/min   GFR calc Af Amer 21 (*) >90 mL/min  URINALYSIS, ROUTINE W REFLEX MICROSCOPIC      Component Value Range   Color, Urine YELLOW  YELLOW   APPearance CLOUDY (*) CLEAR   Specific Gravity, Urine 1.016  1.005 - 1.030   pH 6.5  5.0 - 8.0   Glucose, UA NEGATIVE  NEGATIVE mg/dL   Hgb urine dipstick NEGATIVE  NEGATIVE   Bilirubin Urine NEGATIVE  NEGATIVE   Ketones, ur NEGATIVE  NEGATIVE mg/dL   Protein, ur 213 (*) NEGATIVE mg/dL   Urobilinogen, UA 0.2  0.0 - 1.0 mg/dL   Nitrite NEGATIVE  NEGATIVE   Leukocytes, UA NEGATIVE  NEGATIVE  URINE MICROSCOPIC-ADD ON      Component Value Range   Squamous Epithelial / LPF FEW (*) RARE   WBC, UA 0-2  <3 WBC/hpf   Bacteria, UA RARE  RARE   Casts HYALINE CASTS (*) NEGATIVE   Urine-Other MUCOUS PRESENT    MAGNESIUM      Component Value Range   Magnesium 2.5  1.5 -  2.5 mg/dL  PHOSPHORUS      Component Value Range   Phosphorus 5.1 (*) 2.3 - 4.6 mg/dL  TSH      Component Value Range   TSH 1.662  0.350 - 4.500 uIU/mL  COMPREHENSIVE METABOLIC PANEL      Component Value Range   Sodium 135  135 - 145 mEq/L   Potassium 4.8  3.5 - 5.1 mEq/L   Chloride 96  96 - 112 mEq/L   CO2 23  19 - 32 mEq/L   Glucose, Bld 115 (*) 70 - 99 mg/dL   BUN 29 (*) 6 - 23 mg/dL   Creatinine, Ser 0.45 (*) 0.50 - 1.10 mg/dL   Calcium 40.9 (*) 8.4 - 10.5 mg/dL   Total Protein 9.7 (*) 6.0 - 8.3 g/dL   Albumin 2.7 (*) 3.5 - 5.2 g/dL   AST 16  0 - 37 U/L   ALT 11  0 - 35 U/L   Alkaline Phosphatase 60  39 - 117 U/L   Total Bilirubin 0.2 (*) 0.3 - 1.2 mg/dL   GFR calc non Af Amer 19 (*) >90 mL/min   GFR calc Af Amer 22 (*) >90 mL/min  CBC      Component Value Range   WBC 7.4  4.0 - 10.5 K/uL   RBC 3.42 (*) 3.87 - 5.11 MIL/uL   Hemoglobin 10.8 (*) 12.0 - 15.0 g/dL   HCT 81.1 (*) 91.4 - 78.2 %   MCV 92.4  78.0 - 100.0 fL   MCH 31.6  26.0 - 34.0 pg   MCHC 34.2  30.0 - 36.0 g/dL   RDW 95.6   21.3 - 08.6 %   Platelets 185  150 - 400 K/uL  PTH, INTACT AND CALCIUM      Component Value Range   PTH 10.3 (*) 14.0 - 72.0 pg/mL   Calcium, Total (PTH) 11.8 (*) 8.4 - 10.5 mg/dL  VITAMIN D 25 HYDROXY      Component Value Range   Vit D, 25-Hydroxy 29 (*) 30 - 89 ng/mL  VITAMIN B12      Component Value Range   Vitamin B-12 1696 (*) 211 - 911 pg/mL  FOLATE      Component Value Range   Folate 10.6    IRON AND TIBC      Component Value Range   Iron 67  42 - 135 ug/dL   TIBC 578  469 - 629 ug/dL   Saturation Ratios 26  20 - 55 %   UIBC 195  125 - 400 ug/dL  FERRITIN      Component Value Range   Ferritin 50  10 - 291 ng/mL  RETICULOCYTES      Component Value Range   Retic Ct Pct 0.8  0.4 - 3.1 %   RBC. 3.42 (*) 3.87 - 5.11 MIL/uL   Retic Count, Manual 27.4  19.0 - 186.0 K/uL  SODIUM, URINE, RANDOM      Component Value Range   Sodium, Ur 132    CREATININE, URINE, RANDOM      Component Value Range   Creatinine, Urine 64.6    KAPPA/LAMBDA LIGHT CHAINS      Component Value Range   Kappa free light chain 2.11 (*) 0.33 - 1.94 mg/dL   Lamda free light chains 0.69  0.57 - 2.63 mg/dL   Kappa, lamda light chain ratio 3.06 (*) 0.26 - 1.65  IMMUNOFIXATION ELECTROPHORESIS, URINE (WITH TOT PROT)      Component Value Range  Time RANDOM     Volume, Urine RANDOM     Total Protein, Urine 467.9     Total Protein, Urine-Ur/day NOT CALC  10 - 140 mg/day   Albumin, U PENDING     Alpha 1, Urine PENDING     Alpha 2, Urine PENDING     Beta, Urine PENDING     Gamma Globulin, Urine PENDING     Free Kappa Lt Chains,Ur 458.00 (*) 0.14 - 2.42 mg/dL   Free Lt Chn Excr Rate NOT CALC     Free Lambda Lt Chains,Ur 0.79 (*) 0.02 - 0.67 mg/dL   Free Lambda Excretion/Day NOT CALC     Free Kappa/Lambda Ratio 579.75 (*) 2.04 - 10.37 ratio   Immunofixation, Urine PENDING    IMMUNOFIXATION ELECTROPHORESIS      Component Value Range   Total Protein ELP 9.0 (*) 6.0 - 8.3 g/dL   IgG (Immunoglobin G),  Serum 353 (*) 690 - 1700 mg/dL   IgA 1610 (*) 69 - 960 mg/dL   IgM, Serum 5 (*) 52 - 322 mg/dL   Immunofix Electr Int PENDING    IGG, IGA, IGM      Component Value Range   IgG (Immunoglobin G), Serum 379 (*) 690 - 1700 mg/dL   IgA 4540 (*) 69 - 981 mg/dL   IgM, Serum 5 (*) 52 - 322 mg/dL  BASIC METABOLIC PANEL      Component Value Range   Sodium 136  135 - 145 mEq/L   Potassium 4.4  3.5 - 5.1 mEq/L   Chloride 100  96 - 112 mEq/L   CO2 21  19 - 32 mEq/L   Glucose, Bld 110 (*) 70 - 99 mg/dL   BUN 21  6 - 23 mg/dL   Creatinine, Ser 1.91 (*) 0.50 - 1.10 mg/dL   Calcium 47.8 (*) 8.4 - 10.5 mg/dL   GFR calc non Af Amer 25 (*) >90 mL/min   GFR calc Af Amer 29 (*) >90 mL/min   A/P: Pt with history of acute renal failure, hypercalcemia, proteinuria, anemia and numerous painful lytic bony lesions suspicious for multiple myeloma; plan is for CT guided bone marrow biopsy on 7/10. Details/risks of procedure d/w pt/family with their understanding and consent. Pt ate this morning and lab technicians are only available in am to process marrow samples, therefore procedure scheduled for 7/10.  Chinita Pester

## 2012-04-05 NOTE — Progress Notes (Signed)
Subjective: Patient mentation continue improving; just intermittent disorientation around pain meds (per family members). Complaining of nausea and vomiting with food intake; no fever, no CP and no SOB. Also complaining of back pain.  Objective: Vital signs in last 24 hours: Temp:  [97.8 F (36.6 C)-98.2 F (36.8 C)] 98.2 F (36.8 C) (07/09 1430) Pulse Rate:  [72-92] 88  (07/09 1430) Resp:  [16-18] 18  (07/09 1430) BP: (123-138)/(71-81) 126/81 mmHg (07/09 1430) SpO2:  [93 %-96 %] 95 % (07/09 1430) Weight change:  Last BM Date: 04/03/12  Intake/Output from previous day: 07/08 0701 - 07/09 0700 In: 2020 [P.O.:220; I.V.:1800] Out: 1800 [Urine:1800] Total I/O In: 1220 [P.O.:220; I.V.:1000] Out: 350 [Urine:350]   Physical Exam: General: Alert, awake, oriented x3, in no acute distress. HEENT: No bruits, no goiter. Heart: Regular rate and rhythm, without murmurs, rubs, gallops. Lungs: Clear to auscultation bilaterally. Abdomen: Soft, nontender, nondistended, positive bowel sounds. (complaining of back pain; lumbar area) Extremities: No clubbing or edema; Left big toe amputated; wound healing ok. Neuro: Grossly intact, nonfocal.  Lab Results: Basic Metabolic Panel:  Basename 04/05/12 0408 04/04/12 0420  NA 136 135  K 4.4 4.8  CL 100 96  CO2 21 23  GLUCOSE 110* 115*  BUN 21 29*  CREATININE 1.88* 2.34*  CALCIUM 11.6* 11.8*14.0*  MG -- 2.5  PHOS -- 5.1*   Liver Function Tests:  [facility] 04/04/12 0420  AST 16  ALT 11  ALKPHOS 60  BILITOT 0.2*  PROT 9.7*  ALBUMIN 2.7*   CBC:  Basename 04/04/12 0420 04/03/12 2237  WBC 7.4 8.6  NEUTROABS -- 5.3  HGB 10.8* 10.8*  HCT 31.6* 31.4*  MCV 92.4 91.8  PLT 185 203   Thyroid Function Tests:  Basename 04/04/12 0420  TSH 1.662  T4TOTAL --  FREET4 --  T3FREE --  THYROIDAB --   Anemia Panel:  Basename 04/04/12 0420  VITAMINB12 1696*  FOLATE 10.6  FERRITIN 50  TIBC 262  IRON 67  RETICCTPCT 0.8    Urinalysis:  Basename 04/03/12 2229  COLORURINE YELLOW  LABSPEC 1.016  PHURINE 6.5  GLUCOSEU NEGATIVE  HGBUR NEGATIVE  BILIRUBINUR NEGATIVE  KETONESUR NEGATIVE  PROTEINUR 100*  UROBILINOGEN 0.2  NITRITE NEGATIVE  LEUKOCYTESUR NEGATIVE    Studies/Results: Dg Orthopantogram  04/04/2012  *RADIOLOGY REPORT*  Clinical Data: 76 year old female.  Dental Pathology.  ORTHOPANTOGRAM/PANORAMIC  Comparison: None.  Findings: Residual dentition (incisors, canine, and left maxillary molar) demonstrate filling with no definite dental caries.  No peri apical lucency.  The remaining dentition is absent.  IMPRESSION: Dental fillings in the residual dentition.  No definite dental caries or peri apical lucency.  Original Report Authenticated By: Harley Hallmark, M.D.   X-ray Chest Pa And Lateral   04/04/2012  *RADIOLOGY REPORT*  Clinical Data: 76 year old female with cough shortness of breath chest pain.  CHEST - 2 VIEW  Comparison: 04/11/2007.  Lumbar MRI from 04/04/2012.  Findings: Upright AP and lateral views of the chest.  There is a peripheral right lung 6 cm mass-like opacity.  This may be pleural based or might be extrapleural.  Mediastinal and hilar contours appear stable and within normal limits. The anterior ribs in the region of the mass are indistinct.  The left lung appears stable and clear. Visualized tracheal air column is within normal limits.  No definite other osseous lesion identified.  IMPRESSION: 1.  Mass-like opacity in the right hemithorax, may be pleural based or could be extrapleural (indeterminate adjacent rib involvement). In light  of the lumbar findings earlier today, top differential considerations are multiple myeloma versus bronchogenic carcinoma of the right chest.  Chest CT (IV contrast preferred) would characterize further. 2.  No other cardiopulmonary abnormality identified.  Original Report Authenticated By: Harley Hallmark, M.D.   Dg Hip Bilateral W/pelvis  04/04/2012   *RADIOLOGY REPORT*  Clinical Data: 76 year old female with pain.  Abnormal lumbar MRI.  BILATERAL HIP WITH PELVIS - 4+ VIEW  Comparison: Lumbar MRI from the same day reported separately. Lumbar radiographs 03/19/2012.  Findings: A subtle but asymmetric decreased bone mineralization of the proximal right femur.  A small (3-4 mm) and lucent lesions are noted in both intertrochanteric regions.  No proximal femoral pathologic fracture is identified.  There may be a small lucent lesions of the left iliac crest versus overlying bowel gas.  No pelvic fracture is identified.  Bone mineralization in the pelvis is similar to that seen in the visible lumbar spine.  IMPRESSION: Decreased bone mineralization in the pelvis and proximal femurs similar to that in the lumbar spine with possible superimposed small lucent lesions, but no pathologic fracture identified. Appearance compatible with widespread multiple myeloma (favored) or bone metastases.  Original Report Authenticated By: Harley Hallmark, M.D.   Mr Lumbar Spine Wo Contrast  04/04/2012  *RADIOLOGY REPORT*  Clinical Data: Low back pain and lower extremity weakness.  MRI LUMBAR SPINE WITHOUT CONTRAST  Technique:  Multiplanar and multiecho pulse sequences of the lumbar spine were obtained without intravenous contrast.  Comparison: Radiographs dated 03/19/2012 and CT scan of the abdomen dated 10/05/2008  Findings: The scan extends from T11-12 through S3.  Tip of the conus is at L1 and appears normal.  There are innumerable lesions throughout the visualized bones. There is a pathologic Schmorl's node in the inferior aspect of L3. The appearance is most typical for multiple myeloma.  T11-12:  The disc is normal.  T12-L1:  There is a Schmorl's node in the inferior endplate of T12 which may be related to the numerous lesions.  No disc bulging.  L1-2:  Small annular disc tear asymmetric bulge of the left of midline with no neural impingement.  L2-3:  Broad-based disc bulge with  a small protrusion into the right neural foramen.  Right L2 nerve exits without impingement. Slight narrowing of the spinal canal and right lateral recess without focal neural impingement.  L3-4: There is a 5 mm retrolisthesis of L3 on L4 with broad-based bulge of the disc narrowing the spinal canal slightly without focal neural impingement.  L4-5:  Slight disc space narrowing with a tiny broad-based disc bulge with osteophyte formation and slight hypertrophy of the left facet joint and ligamentum flavum.  L5-S1:  The disc is normal.  Moderate right and mild left facet arthritis.  There are numerous lesions in the sacrum.  Incidental note is made of a 2.7 cm gallstone.  Gallbladder wall is not thickened.  IMPRESSION: 1.  There are innumerable lesions throughout the entire visualized portion of the skeleton, most likely multiple myeloma. Metastatic cancer can give the same appearance. 2.  Probable pathologic Schmorl's nodes in the inferior aspects of T12 and L3. 3.  No significant neural impingement in the lumbar spine.  Critical Value/emergent results were called by telephone at the time of interpretation on 04/04/2012  at 12:19 p.m.  to  Dr. Vassie Loll, who verbally acknowledged these results.  Original Report Authenticated By: Gwynn Burly, M.D.   US Renal  04/04/2012  *RADIOLOGY REPORT*  Clinical  Data: Renal failure.  Hypertension.  RENAL/URINARY TRACT ULTRASOUND COMPLETE  Comparison:  CT of 10/05/2008  Findings:  Right Kidney:  10.6 cm. No hydronephrosis.  Normal renal cortical thickness and echogenicity.  Left Kidney:  11.1 cm. No hydronephrosis.  Normal renal cortical thickness and echogenicity.  9 mm cyst or minimally complex cyst in the lower pole left kidney.  Bladder:  Within normal limits.  Incidental note is made of a 2.5 cm gallstone, without wall thickening or pericholecystic fluid.  IMPRESSION:  1. No acute process or explanation for renal failure. 2.  Cholelithiasis.  Original Report  Authenticated By: Consuello Bossier, M.D.    Medications: Scheduled Meds:    . atorvastatin  20 mg Oral q1800  . calcitonin  4 Units/kg Intramuscular Q12H  . docusate sodium  100 mg Oral BID  . metoCLOPramide  5 mg Oral TID AC  . predniSONE  20 mg Oral Q breakfast  . sodium chloride  3 mL Intravenous Q12H   Continuous Infusions:    . sodium chloride 125 mL/hr at 04/05/12 0810   PRN Meds:.acetaminophen, acetaminophen, albuterol, ALPRAZolam, cyclobenzaprine, guaiFENesin-dextromethorphan, ondansetron (ZOFRAN) IV, ondansetron, oxyCODONE, oxyCODONE-acetaminophen, traMADol  Assessment/Plan: 1-Acute renal failure: due to dehydration and most likely MM; improving. Will continue IVF's and will follow Cr trend.  2-Hypercalcemia: with bone pain, anemia, renal failure, multiple bone lesions and hypercalcemia is most likely MM. Oncology service has been consulted. BM biopsy unable to be done today; patient ate and technicians to take samples done for today. Will be done by IR on 04/06/12. Calcium is trending down with IVF's and calcitonin; will continue tx. Oncology recommended pamidronate, but dental clearance has not be done yet; will hold on biphosphonate at this moment.  3-AMS: continue improving. Most likely due to hypercalcemia and ARF. Will continue IVF's.  4-Hip pain: continue pain meds; but be cautious with dose; might cause altered mentation if requiring to much.  5-Anemia: stable no transfusion needed. Will follow Hgb trend  6-HYPERTENSION: stable. Will monitor VS; continue holding nephrotoxic antihypertensive drugs.  7-BACK PAIN, LUMBAR: due to MM most likely. Will need treatment as recommended by oncologist; if chemo alone failed; then she will required local radiation.   8-Dehydration: will continue IVF's  9-Deconditioning and MSK pain: Per PT/OT rec's she will benefit of  HHPT/HHOT and 24/7 assistance; if deconditioning worsen with active MM treatment she will required SNF. Will  follow patient evolution.   10-Nausea/vomiting: will start reglan TID.       LOS: 2 days   Dickie Labarre [facility] [phone]  04/05/2012, 3:22 PM

## 2012-04-05 NOTE — H&P (View-Only) (Signed)
Subjective: AMS improved; just weak and debilitated. Denies CP, SOB or fever.  Objective: Vital signs in last 24 hours: Temp:  [98.1 F (36.7 C)-98.5 F (36.9 C)] 98.1 F (36.7 C) (07/08 1402) Pulse Rate:  [82-95] 92  (07/08 1402) Resp:  [11-20] 16  (07/08 1402) BP: (112-138)/(68-78) 120/78 mmHg (07/08 1402) SpO2:  [84 %-97 %] 97 % (07/08 1402) Weight:  [79.6 kg (175 lb 7.8 oz)-82.101 kg (181 lb)] 79.6 kg (175 lb 7.8 oz) (07/08 0400) Weight change:  Last BM Date: 04/03/12  Intake/Output from previous day: 07/07 0701 - 07/08 0700 In: 677.1 [I.V.:677.1] Out: 325 [Urine:325] Total I/O In: 120 [P.O.:120] Out: 750 [Urine:750]   Physical Exam: General: Alert, awake, oriented x2, in no acute distress. HEENT: No bruits, no goiter. Heart: Regular rate and rhythm, without murmurs, rubs, gallops. Lungs: Clear to auscultation bilaterally. Abdomen: Soft, nontender, nondistended, positive bowel sounds. Extremities: No clubbing or edema; Left big toe amputated; wound healing ok. Neuro: Grossly intact, nonfocal.  Lab Results: Basic Metabolic Panel:  Basename 04/04/12 0420 04/03/12 2237  NA 135 135  K 4.8 4.9  CL 96 96  CO2 23 24  GLUCOSE 115* 112*  BUN 29* 32*  CREATININE 2.34* 2.45*  CALCIUM 14.0* 14.7*  MG 2.5 --  PHOS 5.1* --   Liver Function Tests:  Basename 04/04/12 0420  AST 16  ALT 11  ALKPHOS 60  BILITOT 0.2*  PROT 9.7*  ALBUMIN 2.7*   CBC:  Basename 04/04/12 0420 04/03/12 2237  WBC 7.4 8.6  NEUTROABS -- 5.3  HGB 10.8* 10.8*  HCT 31.6* 31.4*  MCV 92.4 91.8  PLT 185 203   Thyroid Function Tests:  Basename 04/04/12 0420  TSH 1.662  T4TOTAL --  FREET4 --  T3FREE --  THYROIDAB --   Anemia Panel:  Basename 04/04/12 0420  VITAMINB12 1696*  FOLATE 10.6  FERRITIN 50  TIBC 262  IRON 67  RETICCTPCT 0.8   Urinalysis:  Basename 04/03/12 2229  COLORURINE YELLOW  LABSPEC 1.016  PHURINE 6.5  GLUCOSEU NEGATIVE  HGBUR NEGATIVE  BILIRUBINUR  NEGATIVE  KETONESUR NEGATIVE  PROTEINUR 100*  UROBILINOGEN 0.2  NITRITE NEGATIVE  LEUKOCYTESUR NEGATIVE    Studies/Results: X-ray Chest Pa And Lateral   04/04/2012  *RADIOLOGY REPORT*  Clinical Data: 76-year-old female with cough shortness of breath chest pain.  CHEST - 2 VIEW  Comparison: 04/11/2007.  Lumbar MRI from 04/04/2012.  Findings: Upright AP and lateral views of the chest.  There is a peripheral right lung 6 cm mass-like opacity.  This may be pleural based or might be extrapleural.  Mediastinal and hilar contours appear stable and within normal limits. The anterior ribs in the region of the mass are indistinct.  The left lung appears stable and clear. Visualized tracheal air column is within normal limits.  No definite other osseous lesion identified.  IMPRESSION: 1.  Mass-like opacity in the right hemithorax, may be pleural based or could be extrapleural (indeterminate adjacent rib involvement). In light of the lumbar findings earlier today, top differential considerations are multiple myeloma versus bronchogenic carcinoma of the right chest.  Chest CT (IV contrast preferred) would characterize further. 2.  No other cardiopulmonary abnormality identified.  Original Report Authenticated By: H.LEE HALL III, M.D.   Dg Hip Bilateral W/pelvis  04/04/2012  *RADIOLOGY REPORT*  Clinical Data: 76-year-old female with pain.  Abnormal lumbar MRI.  BILATERAL HIP WITH PELVIS - 4+ VIEW  Comparison: Lumbar MRI from the same day reported separately. Lumbar radiographs 03/19/2012.    Findings: A subtle but asymmetric decreased bone mineralization of the proximal right femur.  A small (3-4 mm) and lucent lesions are noted in both intertrochanteric regions.  No proximal femoral pathologic fracture is identified.  There may be a small lucent lesions of the left iliac crest versus overlying bowel gas.  No pelvic fracture is identified.  Bone mineralization in the pelvis is similar to that seen in the visible lumbar  spine.  IMPRESSION: Decreased bone mineralization in the pelvis and proximal femurs similar to that in the lumbar spine with possible superimposed small lucent lesions, but no pathologic fracture identified. Appearance compatible with widespread multiple myeloma (favored) or bone metastases.  Original Report Authenticated By: H.LEE HALL III, M.D.   Mr Lumbar Spine Wo Contrast  04/04/2012  *RADIOLOGY REPORT*  Clinical Data: Low back pain and lower extremity weakness.  MRI LUMBAR SPINE WITHOUT CONTRAST  Technique:  Multiplanar and multiecho pulse sequences of the lumbar spine were obtained without intravenous contrast.  Comparison: Radiographs dated 03/19/2012 and CT scan of the abdomen dated 10/05/2008  Findings: The scan extends from T11-12 through S3.  Tip of the conus is at L1 and appears normal.  There are innumerable lesions throughout the visualized bones. There is a pathologic Schmorl's node in the inferior aspect of L3. The appearance is most typical for multiple myeloma.  T11-12:  The disc is normal.  T12-L1:  There is a Schmorl's node in the inferior endplate of T12 which may be related to the numerous lesions.  No disc bulging.  L1-2:  Small annular disc tear asymmetric bulge of the left of midline with no neural impingement.  L2-3:  Broad-based disc bulge with a small protrusion into the right neural foramen.  Right L2 nerve exits without impingement. Slight narrowing of the spinal canal and right lateral recess without focal neural impingement.  L3-4: There is a 5 mm retrolisthesis of L3 on L4 with broad-based bulge of the disc narrowing the spinal canal slightly without focal neural impingement.  L4-5:  Slight disc space narrowing with a tiny broad-based disc bulge with osteophyte formation and slight hypertrophy of the left facet joint and ligamentum flavum.  L5-S1:  The disc is normal.  Moderate right and mild left facet arthritis.  There are numerous lesions in the sacrum.  Incidental note is made  of a 2.7 cm gallstone.  Gallbladder wall is not thickened.  IMPRESSION: 1.  There are innumerable lesions throughout the entire visualized portion of the skeleton, most likely multiple myeloma. Metastatic cancer can give the same appearance. 2.  Probable pathologic Schmorl's nodes in the inferior aspects of T12 and L3. 3.  No significant neural impingement in the lumbar spine.  Critical Value/emergent results were called by telephone at the time of interpretation on 04/04/2012  at 12:19 p.m.  to  Dr. Sakari Alkhatib, who verbally acknowledged these results.  Original Report Authenticated By: JAMES H. MAXWELL, M.D.   Us Renal  04/04/2012  *RADIOLOGY REPORT*  Clinical Data: Renal failure.  Hypertension.  RENAL/URINARY TRACT ULTRASOUND COMPLETE  Comparison:  CT of 10/05/2008  Findings:  Right Kidney:  10.6 cm. No hydronephrosis.  Normal renal cortical thickness and echogenicity.  Left Kidney:  11.1 cm. No hydronephrosis.  Normal renal cortical thickness and echogenicity.  9 mm cyst or minimally complex cyst in the lower pole left kidney.  Bladder:  Within normal limits.  Incidental note is made of a 2.5 cm gallstone, without wall thickening or pericholecystic fluid.  IMPRESSION:  1.   No acute process or explanation for renal failure. 2.  Cholelithiasis.  Original Report Authenticated By: KYLE D. TALBOT, M.D.    Medications: Scheduled Meds:   . atorvastatin  20 mg Oral q1800  . calcitonin  4 Units/kg Intramuscular Q12H  . docusate sodium  100 mg Oral BID  . ondansetron  4 mg Intravenous Once  . predniSONE  20 mg Oral Q breakfast  . sodium chloride  250 mL Intravenous Once  . sodium chloride  250 mL Intravenous Once  . sodium chloride  3 mL Intravenous Q12H  . DISCONTD: sodium chloride   Intravenous STAT   Continuous Infusions:   . sodium chloride 150 mL/hr at 04/04/12 1008  . sodium chloride    . DISCONTD: sodium chloride 100 mL/hr at 04/03/12 2314   PRN Meds:.acetaminophen, acetaminophen,  albuterol, ALPRAZolam, cyclobenzaprine, guaiFENesin-dextromethorphan, ondansetron (ZOFRAN) IV, ondansetron, oxyCODONE, oxyCODONE-acetaminophen, traMADol, DISCONTD:  HYDROmorphone (DILAUDID) injection, DISCONTD: ondansetron (ZOFRAN) IV, DISCONTD: oxyCODONE-acetaminophen  Assessment/Plan: 1-Acute renal failure: due to dehydration and most likely MM; will continue IVF's and will follow Cr trend.  2-Hypercalcemia: with bone pain, anemia, renal failure, multiple bone lesions and hypercalcemia is most likely MM. Oncology service has been consulted. BM biopsy tomorrow; will follow rec's. Biphosphonate once clear by dentist and rehydrated.  3-AMS: better; due to hypercalcemia and ARF. Will continue IVF's.  4-Hip pain: continue pain meds.  5-Anemia; no transfusion needed. Will follow Hgb trend  6-HYPERTENSION: stable. Will monitor Vs while we hold nephrotoxic antihypertensive drugs.  7-BACK PAIN, LUMBAR: due to MM most likely. Will need treatment as recommended by oncologist; if chemo along failed; then she will required local radiation.   8-Dehydration: will provide IVF's  9-Deconditioning and MSK pain:will ask PT/OT to evaluate and provide rec's for safe discharge.       LOS: 1 day   Stefanee Mckell [facility] [phone]  04/04/2012, 4:23 PM   

## 2012-04-05 NOTE — Evaluation (Signed)
Physical Therapy Evaluation Patient Details Name: [name] MRN: [id number] DOB: 26-Mar-1936 Today's Date: 04/05/2012 Time: 7829-5621 PT Time Calculation (min): 24 min  PT Assessment / Plan / Recommendation Clinical Impression  Pt admitted for acute renal failure and hypercalcemia.  Pt also with suspected multiple myeloma and may require chemo and/or radiation as treatment.  Pt presents with generalized weakness, decreased mobility, and poor activity tolerance.  Pt would benefit from acute PT services in order to improve safety and independence with mobility.  At current time, feel pt would be okay to d/c home with spouse however, if pt undergoes treatment for multiple myeloma, suspect pt may need more assist than spouse would be able to provide.      PT Assessment  Patient needs continued PT services    Follow Up Recommendations  Supervision/Assistance - 24 hour;Home health PT;Other (comment) (may need ST-SNF or more assist if treated for MM)    Barriers to Discharge        Equipment Recommendations  Rolling walker with 5" wheels    Recommendations for Other Services     Frequency Min 3X/week    Precautions / Restrictions Precautions Precautions: Fall Restrictions Weight Bearing Restrictions: No   Pertinent Vitals/Pain 6.5/10 pain in back, repositioned, RN notified      Mobility  Bed Mobility Bed Mobility: Supine to Sit Supine to Sit: 5: Supervision;With rails;HOB elevated Transfers Transfers: Sit to Stand;Stand to Sit Sit to Stand: 4: Min assist;From bed;From chair/3-in-1;With upper extremity assist Stand to Sit: 4: Min assist;To chair/3-in-1;With upper extremity assist Details for Transfer Assistance: multimodal cues for hand placement and safe technique, pt slow to respond to verbal cues requiring visual or tactile cues Ambulation/Gait Ambulation/Gait Assistance: 4: Min guard Ambulation Distance (Feet): 120 Feet Assistive device: Rolling walker Ambulation/Gait  Assistance Details: increased verbal cues for use of RW, manual assist of RW at times due to veering, pt able to take bigger steps with cuing however quickly reverts back to short step length without cues Gait Pattern: Step-through pattern;Decreased stride length;Shuffle Gait velocity: decreased    Exercises     PT Diagnosis: Difficulty walking;Generalized weakness  PT Problem List: Decreased strength;Decreased activity tolerance;Decreased mobility;Decreased safety awareness;Decreased knowledge of use of DME;Decreased cognition;Pain PT Treatment Interventions: DME instruction;Gait training;Functional mobility training;Therapeutic activities;Therapeutic exercise;Patient/family education   PT Goals Acute Rehab PT Goals PT Goal Formulation: With patient Time For Goal Achievement: 04/12/12 Potential to Achieve Goals: Fair Pt will go Supine/Side to Sit: with modified independence PT Goal: Supine/Side to Sit - Progress: Goal set today Pt will go Sit to Stand: with supervision PT Goal: Sit to Stand - Progress: Goal set today Pt will go Stand to Sit: with supervision PT Goal: Stand to Sit - Progress: Goal set today Pt will Ambulate: >150 feet;with supervision;with least restrictive assistive device PT Goal: Ambulate - Progress: Goal set today  Visit Information  Last PT Received On: 04/05/12 Assistance Needed: +1 PT/OT Co-Evaluation/Treatment: Yes    Subjective Data  Subjective: "I'm getting tired and starting to hurt more.  Let's turn around." with ambulation   Prior Functioning  Home Living Lives With: Spouse Available Help at Discharge: Family Type of Home: Independent living facility (senior apartments) Bathroom Toilet: Handicapped height (grab bars both sides) Home Adaptive Equipment: Wheelchair - manual Prior Function Level of Independence: Needs assistance Needs Assistance: Bathing;Dressing;Toileting;Meal Prep;Light Housekeeping;Gait Bath: Moderate Dressing:  Moderate Toileting: Minimal Meal Prep: Total Light Housekeeping: Total Gait Assistance: hand held assist by husband 24/7 for the last week  or so Communication Communication: No difficulties    Cognition  Overall Cognitive Status:  (slow to respond to commands at times) Arousal/Alertness: Awake/alert Orientation Level: Other (comment) (with increased time, named place) Behavior During Session: [facility] for tasks performed    Extremity/Trunk Assessment Right Upper Extremity Assessment RUE ROM/Strength/Tone: [facility] for tasks assessed Left Upper Extremity Assessment LUE ROM/Strength/Tone: WFL for tasks assessed Right Lower Extremity Assessment RLE ROM/Strength/Tone: Deficits RLE ROM/Strength/Tone Deficits: grossly 3+/5 per observation Left Lower Extremity Assessment LLE ROM/Strength/Tone: Deficits LLE ROM/Strength/Tone Deficits: grossly 3+/5 per observation   Balance    End of Session PT - End of Session Activity Tolerance: Patient limited by fatigue;Patient limited by pain Patient left: in chair;with call bell/phone within reach;with family/visitor present Nurse Communication: Patient requests pain meds  GP     Gailen Venne,KATHrine E 04/05/2012, 11:49 AM Pager: [phone]

## 2012-04-06 ENCOUNTER — Encounter (HOSPITAL_COMMUNITY)
Admission: EM | Disposition: A | Discharge status: Rehabilitation Facility | Payer: Self-pay | Source: Home / Self Care | Attending: Internal Medicine

## 2012-04-06 ENCOUNTER — Encounter (HOSPITAL_COMMUNITY): Payer: Self-pay | Admitting: Anesthesiology

## 2012-04-06 ENCOUNTER — Encounter (HOSPITAL_COMMUNITY): Payer: Self-pay | Admitting: Neurological Surgery

## 2012-04-06 ENCOUNTER — Inpatient Hospital Stay (HOSPITAL_COMMUNITY): Payer: Medicare Other

## 2012-04-06 ENCOUNTER — Encounter (HOSPITAL_COMMUNITY): Payer: Self-pay | Admitting: Dentistry

## 2012-04-06 ENCOUNTER — Other Ambulatory Visit (HOSPITAL_COMMUNITY): Payer: Medicare Other

## 2012-04-06 ENCOUNTER — Inpatient Hospital Stay: Admit: 2012-04-06 | Payer: Self-pay | Admitting: Neurological Surgery

## 2012-04-06 DIAGNOSIS — M545 Low back pain: Secondary | ICD-10-CM

## 2012-04-06 DIAGNOSIS — N179 Acute kidney failure, unspecified: Secondary | ICD-10-CM

## 2012-04-06 DIAGNOSIS — I6200 Nontraumatic subdural hemorrhage, unspecified: Secondary | ICD-10-CM

## 2012-04-06 DIAGNOSIS — S065X9A Traumatic subdural hemorrhage with loss of consciousness of unspecified duration, initial encounter: Secondary | ICD-10-CM | POA: Diagnosis present

## 2012-04-06 DIAGNOSIS — D649 Anemia, unspecified: Secondary | ICD-10-CM

## 2012-04-06 DIAGNOSIS — M899 Disorder of bone, unspecified: Secondary | ICD-10-CM

## 2012-04-06 DIAGNOSIS — C9000 Multiple myeloma not having achieved remission: Secondary | ICD-10-CM

## 2012-04-06 DIAGNOSIS — S065X0A Traumatic subdural hemorrhage without loss of consciousness, initial encounter: Secondary | ICD-10-CM

## 2012-04-06 DIAGNOSIS — Z0189 Encounter for other specified special examinations: Secondary | ICD-10-CM

## 2012-04-06 DIAGNOSIS — S065XAA Traumatic subdural hemorrhage with loss of consciousness status unknown, initial encounter: Secondary | ICD-10-CM | POA: Diagnosis present

## 2012-04-06 DIAGNOSIS — Z1389 Encounter for screening for other disorder: Secondary | ICD-10-CM

## 2012-04-06 LAB — IMMUNOFIXATION ELECTROPHORESIS
IgA: 4020 mg/dL — ABNORMAL HIGH (ref 69–380)
IgG (Immunoglobin G), Serum: 353 mg/dL — ABNORMAL LOW (ref 690–1700)
IgM, Serum: 5 mg/dL — ABNORMAL LOW (ref 52–322)

## 2012-04-06 LAB — PROTEIN, URINE, 24 HOUR
Collection Interval-UPROT: 24 hours
Protein, 24H Urine: 901 mg/d — ABNORMAL HIGH (ref 50–100)
Protein, Urine: 68 mg/dL
Urine Total Volume-UPROT: 1325 mL

## 2012-04-06 LAB — UIFE/LIGHT CHAINS/TP QN, 24-HR UR
Alpha 1, Urine: DETECTED — AB
Alpha 2, Urine: DETECTED — AB
Beta, Urine: DETECTED — AB
Free Kappa Lt Chains,Ur: 458 mg/dL — ABNORMAL HIGH (ref 0.14–2.42)
Gamma Globulin, Urine: DETECTED — AB

## 2012-04-06 LAB — PROTIME-INR: Prothrombin Time: 16.7 seconds — ABNORMAL HIGH (ref 11.6–15.2)

## 2012-04-06 LAB — BASIC METABOLIC PANEL
CO2: 17 mEq/L — ABNORMAL LOW (ref 19–32)
Calcium: 11.7 mg/dL — ABNORMAL HIGH (ref 8.4–10.5)
GFR calc non Af Amer: 32 mL/min — ABNORMAL LOW (ref >90)
Glucose, Bld: 106 mg/dL — ABNORMAL HIGH (ref 70–99)
Potassium: 4.3 mEq/L (ref 3.5–5.1)
Sodium: 133 mEq/L — ABNORMAL LOW (ref 135–145)

## 2012-04-06 LAB — PROTEIN ELECTROPHORESIS, SERUM
Alpha-2-Globulin: 8.5 % (ref 7.1–11.8)
Beta 2: 2 % — ABNORMAL LOW (ref 3.2–6.5)
Beta Globulin: 48.7 % — ABNORMAL HIGH (ref 4.7–7.2)
Gamma Globulin: 4.8 % — ABNORMAL LOW (ref 11.1–18.8)
M-Spike, %: 4.06 g/dL

## 2012-04-06 LAB — APTT: aPTT: 36 seconds (ref 24–37)

## 2012-04-06 LAB — CBC
Hemoglobin: 10.4 g/dL — ABNORMAL LOW (ref 12.0–15.0)
MCH: 31 pg (ref 26.0–34.0)
MCV: 91.9 fL (ref 78.0–100.0)
Platelets: 199 10*3/uL (ref 150–400)
RBC: 3.35 MIL/uL — ABNORMAL LOW (ref 3.87–5.11)
WBC: 11 10*3/uL — ABNORMAL HIGH (ref 4.0–10.5)

## 2012-04-06 LAB — VITAMIN D 1,25 DIHYDROXY
Vitamin D2 1, 25 (OH)2: <8 pg/mL
Vitamin D3 1, 25 (OH)2: <8 pg/mL

## 2012-04-06 LAB — MRSA PCR SCREENING: MRSA by PCR: NEGATIVE

## 2012-04-06 SURGERY — CRANIOTOMY HEMATOMA EVACUATION SUBDURAL
Anesthesia: General | Laterality: Right

## 2012-04-06 MED ORDER — CHLORHEXIDINE GLUCONATE 0.12 % MT SOLN
15.0000 mL | Freq: Two times a day (BID) | OROMUCOSAL | Status: DC
  Administered 2012-04-06 – 2012-04-12 (×12): 15 mL via OROMUCOSAL
  Filled 2012-04-06 (×14): qty 15

## 2012-04-06 NOTE — Progress Notes (Signed)
DENTAL CONSULTATION  Date of Consultation:  04/06/2012 Patient Name:   [name] Date of Birth:   [birth year] Medical Record Number: [id number]  VITALS: BP 123/81  Pulse 93  Temp 97.5 F (36.4 C) (Oral)  Resp 16  Ht 5\' 7"  (1.702 m)  Wt 175 lb 7.8 oz (79.6 kg)  BMI 27.49 kg/m2  SpO2 92%   CHIEF COMPLAINT: Patient referred for a pre-Zometa dental protocol evaluation.  HPI: [name] is a 76 year old female referred by [name] for a pre-Zometa dental evaluation. Patient with anticipated use of Zometa due to recent pending diagnosis of multiple myeloma.  Patient currently denies acute toothache, swellings, or abscesses. She is currently being seen by Dr. Modesto Charon in [place] for [place] of partial dentures. Patient is only seen on the as-needed basis. Pateint has been seen recently for the fabrication of the partial dentures by patient report.   Patient Active Problem List  Diagnosis  . HYPERLIPIDEMIA NEC/NOS  . ANXIETY  . DEPRESSION  . HYPERTENSION  . BREAST MASS, RIGHT  . BACK PAIN, LUMBAR  . OSTEOPENIA  . INSOMNIA  . HYPERGLYCEMIA  . POSTMENOPAUSAL STATUS  . Obesity  . Dehydration  . Acute renal failure  . Hypercalcemia  . Hip pain  . Anemia    PMH: Past Medical History  Diagnosis Date  . Anxiety   . Hypertension   . Hyperlipidemia   . Depression   . Osteopenia     PSH: Past Surgical History  Procedure Date  . Breast lumpectomy     ALLERGIES: Allergies  Allergen Reactions  . Simvastatin Other (See Comments)    Joint aches     MEDICATIONS: Current Facility-Administered Medications  Medication Dose Route Frequency Provider Last Rate Last Dose  . 0.9 %  sodium chloride infusion   Intravenous Continuous Vassie Loll, MD 125 mL/hr at 04/06/12 0100    . acetaminophen (TYLENOL) tablet 650 mg  650 mg Oral Q6H PRN Therisa Doyne, MD       Or  . acetaminophen (TYLENOL) suppository 650 mg  650 mg Rectal Q6H  PRN Therisa Doyne, MD      . albuterol (PROVENTIL) (5 MG/ML) 0.5% nebulizer solution 2.5 mg  2.5 mg Nebulization Q2H PRN Therisa Doyne, MD      . ALPRAZolam Prudy Feeler) tablet 1 mg  1 mg Oral QHS PRN Therisa Doyne, MD   1 mg at 04/05/12 2209  . atorvastatin (LIPITOR) tablet 20 mg  20 mg Oral q1800 Therisa Doyne, MD   20 mg at 04/05/12 1731  . calcitonin (MIACALCIN) injection 328 Units  4 Units/kg Intramuscular Q12H Therisa Doyne, MD   328 Units at 04/05/12 2210  . cyclobenzaprine (FLEXERIL) tablet 5 mg  5 mg Oral TID PRN Therisa Doyne, MD      . docusate sodium (COLACE) capsule 100 mg  100 mg Oral BID Therisa Doyne, MD   100 mg at 04/05/12 2158  . guaiFENesin-dextromethorphan (ROBITUSSIN DM) 100-10 MG/5ML syrup 5 mL  5 mL Oral Q4H PRN Therisa Doyne, MD      . metoCLOPramide (REGLAN) tablet 5 mg  5 mg Oral TID [facility] Vassie Loll, MD   5 mg at 04/05/12 1731  . ondansetron (ZOFRAN) tablet 4 mg  4 mg Oral Q6H PRN Therisa Doyne, MD       Or  . ondansetron (ZOFRAN) injection 4 mg  4 mg Intravenous Q6H PRN Therisa Doyne, MD   4 mg at 04/05/12 1248  .  oxyCODONE-acetaminophen (PERCOCET) 5-325 MG per tablet 1 tablet  1 tablet Oral Q4H PRN Therisa Doyne, MD   1 tablet at 04/06/12 0120   And  . oxyCODONE (Oxy IR/ROXICODONE) immediate release tablet 2.5 mg  2.5 mg Oral Q4H PRN Therisa Doyne, MD   2.5 mg at 04/05/12 1615  . predniSONE (DELTASONE) tablet 20 mg  20 mg Oral Q breakfast Therisa Doyne, MD   20 mg at 04/05/12 0837  . sodium chloride 0.9 % injection 3 mL  3 mL Intravenous Q12H Therisa Doyne, MD   3 mL at 04/05/12 2158  . traMADol (ULTRAM) tablet 25 mg  25 mg Oral QHS PRN Therisa Doyne, MD        LABS: Lab Results  Component Value Date   WBC 11.0* 04/06/2012   HGB 10.4* 04/06/2012   HCT 30.8* 04/06/2012   MCV 91.9 04/06/2012   PLT 199 04/06/2012      Component Value Date/Time   NA 133* 04/06/2012 0420   K 4.3 04/06/2012 0420    CL 96 04/06/2012 0420   CO2 17* 04/06/2012 0420   GLUCOSE 106* 04/06/2012 0420   GLUCOSE 96 09/30/2006 1322   BUN 19 04/06/2012 0420   CREATININE 1.54* 04/06/2012 0420   CREATININE 1.18* 03/14/2012 0914   CALCIUM 11.7* 04/06/2012 0420   CALCIUM 11.8* 04/04/2012 0420   GFRNONAA 32* 04/06/2012 0420   GFRAA 37* 04/06/2012 0420   Lab Results  Component Value Date   INR 1.33 04/06/2012   No results found for this basename: PTT    SOCIAL HISTORY: History   Social History  . Marital Status: Married    Spouse Name: N/A    Number of Children: N/A  . Years of Education: N/A   Occupational History  . Not on file.   Social History Main Topics  . Smoking status: Never Smoker   . Smokeless tobacco: Never Used  . Alcohol Use: No  . Drug Use: No  . Sexually Active: Not on file   Other Topics Concern  . Not on file   Social History Narrative  . No narrative on file    FAMILY HISTORY: Family History  Problem Relation Age of Onset  . Heart disease Mother   . Hyperlipidemia Sister   . Hypertension Sister   . Diabetes Sister   . Diabetes Sister   . Hyperlipidemia Sister   . Hypertension Sister   . Sudden death Neg Hx   . Heart attack Neg Hx      REVIEW OF SYSTEMS: Reviewed from chart for this admission.  DENTAL HISTORY: CHIEF COMPLAINT: Patient referred for a pre-Zometa dental protocol evaluation.  HPI: [name] is a 76 year old female referred by [name] for a pre-Zometa dental evaluation. Patient with anticipated use of Zometa due to recent diagnosis of multiple myeloma.  Patient currently denies acute toothache, swellings, or abscesses. She is currently being seen by Dr. Modesto Charon in [place], [place] for fabrication of partial dentures. Patient is only seen on the as-needed basis. Pateint has been seen recently for the fabrication of the partial dentures by patient report.   DENTAL EXAMINATION:  GENERAL: Patient is a well-developed, well-nourished  female in no acute distress. HEAD AND NECK: There is no obvious submandibular lymphadenopathy. Patient denies acute TMJ symptoms. INTRAORAL EXAM: Patient has normal saliva. I do not see any evidence of abscess formation. DENTITION: Patient with multiple missing teeth numbers 1 -5, 11, 12, 13, 14, 16, 17, 18, 19, 20,  21, 28, 29, 30, 31, and 32. PERIODONTAL: Patient with chronic periodontitis with plaque accumulations, gingival recession, but no obvious tooth mobility. DENTAL CARIES/SUBOPTIMAL RESTORATIONS: No obvious dental caries noted at this time. ENDODONTIC: Patient currently denies acute pulpitis symptoms. I do not see any evidence of periapical pathology or radiolucency. CROWN AND BRIDGE: She has a crown on tooth #10. PROSTHODONTIC: There are no partial dentures with the patient today. Patient indicates that partial(s) to be fabricated by Dr. Freida Busman in [facility]. OCCLUSION: Patient has poor occlusal scheme noted. RADIOGRAPHIC INTERPRETATION: A suboptimal orthopantogram was obtained by the department of radiology due to patient movement. There are multiple missing teeth. There are multiple dental restorations noted. There is no obvious periapical pathology or radiolucency. There is incipient to moderate bone loss. There is supra-eruption and drifting of the unopposed teeth into the edentulous areas.   ASSESSMENTS: 1. Chronic periodontitis of bone loss 2. Gingival recession 3. No significant tooth mobility 4. Accretions 5. Multiple missing teeth 6. Supra-eruption and drifting of the unopposed teeth into the edentulous areas 7. Patient indicates that partial dentures are being fabricated at this time. 8. Need for at least every 6 month routine dental care.   PLAN/RECOMMENDATIONS: 1. I discussed the risks, benefits, and complications of various treatment options with the patient in relationship to her medical and dental conditions and anticipated Zometa therapy and the  future risk for osteonecrosis of the jaws with invasive dental procedures. We discussed various treatment options to include no treatment, multiple extractions with alveoloplasty, pre-prosthetic surgery as indicated, periodontal therapy, dental restorations, root canal therapy, crown and bridge therapy, implant therapy, and replacement of missing teeth as indicated. The patient currently wishes to proceed with continued care with her primary dentist, Dr. Modesto Charon, in [place]. Due to the plaque accumulations, I will order chlorhexidine rinses to be used twice daily at this time. Patient expressed an understanding that once Zometa therapy as given, the patient should ideally not have any periodontal surgery or extraction of teeth. I will also forward this information to Dr. Freida Busman at the patient's request.  2. Discussion of findings with medical team and coordination of future medical and dental care.   Charlynne Pander, DDS

## 2012-04-06 NOTE — Progress Notes (Signed)
About 04:05 am, patient was found on the floor, her back against the floor, her heard resting on the recliner chair, wet with her urine. Patient was confused to place, time, and situation and oriented to person only upon assessment. Patient is not in any form of distress. Patient has a bump at the back of her head and complained of soreness, skin intact, no redress noted. Patient asked to do range of motion exercises on all extremities and no further complaints of pain noted. Patient was assisted back to bed, vital signs are stable. On Call team was and family was notified.

## 2012-04-06 NOTE — Progress Notes (Signed)
Chaplain Note:  Chaplain visited with pt, pt's son, and  Lucila Maine.  Pt's physician and nurse were in room for part of the visit.  Pt was resting in bed.  Pt has recently been diagnosed with cancer but has not had a goal setting conversation with oncologist.   During this visit, pt received a good prognosis from the physician treating pt's head injury.     Spiritual Assessment -  Pt and pt's family are still in the initial grief stages resulting from her cancer diagnosis.  The reality of the situation is still sinking in.  Chaplain provided spiritual comfort, support, and prayer for pt and pt's family, helping them begin to move through the grieving process.  Chaplain will follow up as needed.  04/06/12 1500  Clinical Encounter Type  Visited With Patient and family together  Visit Type Spiritual support;Initial  Referral From Family  Spiritual Encounters  Spiritual Needs Emotional;Prayer  Stress Factors  Patient Stress Factors Lack of knowledge;Health changes;Financial concerns;Exhausted;Loss of control;Major life changes (Pt recieved news that she has cancer)  Family Stress Factors Exhausted;Family relationships;Financial concerns;Lack of knowledge;Loss of control;Major life changes;Other (Comment) (Family coming to terms with pt's cancer diagnosis)   Verdie Shire, chaplain resident [phone]

## 2012-04-06 NOTE — Progress Notes (Signed)
Transferred to [place] per care link, to room 3113. Report given to brandy on 3100 and to kelly at care link

## 2012-04-06 NOTE — Progress Notes (Signed)
[facility] PROGRESS NOTE  [name] MVH:[id number] DOB: 01/15/1936 DOA: 04/03/2012 PCP: [name], MD  Assessment/Plan: Active Problems:  HYPERTENSION  BACK PAIN, LUMBAR  Dehydration  Acute renal failure  Hypercalcemia  Hip pain  Anemia   1. Unwitnessed fall at bedside./ Will obtain Ct head and c-spine w/o cm and continue to monitor closely.  Code Status:  Full Code Family Communication: RN attempted to notify family  Brief narrative: Unwitnessed fall.   Procedures: Ct head and c-spine w/o cm  HPI/Subjective: Notified by RN that pt was found in floor bedside her bed at approx 0400 s/p apparent unwitnessed fall. Stated pt was confused upon being discovered and was not able to recall events. Upon my arrival to bedside pt seemed somewhat lethargic (had received Percocet at [facility]) but was able to answer questions. Still unable to recall events other than to say "I fell". RN felt pt attempted to get OOB to go to BR and fell, as she was right outside the room when this occurred. Pt denies chest pain, SOB, nausea or other c/o's.  Objective: Filed Vitals:   04/05/12 1430 04/05/12 2135 04/06/12 0420 04/06/12 0524  BP: 126/81 144/88 135/87 131/87  Pulse: 88 98  95  Temp: 98.2 F (36.8 C) 97.4 F (36.3 C)  97.9 F (36.6 C)  TempSrc: Oral Oral  Oral  Resp: 18 18  18   Height:      Weight:      SpO2: 95% 96% 97% 93%    Intake/Output Summary (Last 24 hours) at 04/06/12 0533 Last data filed at 04/06/12 0126  Gross per 24 hour  Intake   1840 ml  Output   1275 ml  Net    565 ml    Exam:   General:  Pt is now oriented to person and place. Still unable to recall events of fall. PERRL, MAE's x 4 w/o pain on passive ROM. No obvious open wounds or deformities. Large approx 5-6cm hematoma to posterior (R) occipital scalp area that is TTP. No open wound. Endorses posterior neck TTP. Remaining bony spine w/o TTP or visibile wounds.   Cardiovascular: Bp- 135/87, HR 105  w/ RRR and w/o M/G/R.   Respiratory: R-20, BBS CTA bil w/ 02 sat of 96% on R/A  Abdomen: Soft, nt w/ normal BS  Data Reviewed: Basic Metabolic Panel:  Lab 04/06/12 8413 04/05/12 0408 04/04/12 0420 04/03/12 2237  NA 133* 136 135 135  K 4.3 4.4 4.8 4.9  CL 96 100 96 96  CO2 17* 21 23 24   GLUCOSE 106* 110* 115* 112*  BUN 19 21 29* 32*  CREATININE 1.54* 1.88* 2.34* 2.45*  CALCIUM 11.7* 11.6* 11.8*14.0* 14.7*  MG -- -- 2.5 --  PHOS -- -- 5.1* --   Liver Function Tests:  Lab 04/04/12 0420  AST 16  ALT 11  ALKPHOS 60  BILITOT 0.2*  PROT 9.7*  ALBUMIN 2.7*   No results found for this basename: LIPASE:5,AMYLASE:5 in the last 168 hours No results found for this basename: AMMONIA:5 in the last 168 hours CBC:  Lab 04/06/12 0420 04/04/12 0420 04/03/12 2237  WBC 11.0* 7.4 8.6  NEUTROABS -- -- 5.3  HGB 10.4* 10.8* 10.8*  HCT 30.8* 31.6* 31.4*  MCV 91.9 92.4 91.8  PLT 199 185 203   Cardiac Enzymes: No results found for this basename: CKTOTAL:5,CKMB:5,CKMBINDEX:5,TROPONINI:5 in the last 168 hours BNP (last 3 results) No results found for this basename: PROBNP:3 in the last 8760 hours CBG: No results found  for this basename: GLUCAP:5 in the last 168 hours  No results found for this or any previous visit (from the past 240 hour(s)).   Studies: Dg Orthopantogram  04/04/2012  *RADIOLOGY REPORT*  Clinical Data: 76 year old female.  Dental Pathology.  ORTHOPANTOGRAM/PANORAMIC  Comparison: None.  Findings: Residual dentition (incisors, canine, and left maxillary molar) demonstrate filling with no definite dental caries.  No peri apical lucency.  The remaining dentition is absent.  IMPRESSION: Dental fillings in the residual dentition.  No definite dental caries or peri apical lucency.  Original Report Authenticated By: Harley Hallmark, M.D.   X-ray Chest Pa And Lateral   04/04/2012  *RADIOLOGY REPORT*  Clinical Data: 76 year old female with cough shortness of breath chest pain.  CHEST  - 2 VIEW  Comparison: 04/11/2007.  Lumbar MRI from 04/04/2012.  Findings: Upright AP and lateral views of the chest.  There is a peripheral right lung 6 cm mass-like opacity.  This may be pleural based or might be extrapleural.  Mediastinal and hilar contours appear stable and within normal limits. The anterior ribs in the region of the mass are indistinct.  The left lung appears stable and clear. Visualized tracheal air column is within normal limits.  No definite other osseous lesion identified.  IMPRESSION: 1.  Mass-like opacity in the right hemithorax, may be pleural based or could be extrapleural (indeterminate adjacent rib involvement). In light of the lumbar findings earlier today, top differential considerations are multiple myeloma versus bronchogenic carcinoma of the right chest.  Chest CT (IV contrast preferred) would characterize further. 2.  No other cardiopulmonary abnormality identified.  Original Report Authenticated By: Harley Hallmark, M.D.   Dg Hip Bilateral W/pelvis  04/04/2012  *RADIOLOGY REPORT*  Clinical Data: 76 year old female with pain.  Abnormal lumbar MRI.  BILATERAL HIP WITH PELVIS - 4+ VIEW  Comparison: Lumbar MRI from the same day reported separately. Lumbar radiographs 03/19/2012.  Findings: A subtle but asymmetric decreased bone mineralization of the proximal right femur.  A small (3-4 mm) and lucent lesions are noted in both intertrochanteric regions.  No proximal femoral pathologic fracture is identified.  There may be a small lucent lesions of the left iliac crest versus overlying bowel gas.  No pelvic fracture is identified.  Bone mineralization in the pelvis is similar to that seen in the visible lumbar spine.  IMPRESSION: Decreased bone mineralization in the pelvis and proximal femurs similar to that in the lumbar spine with possible superimposed small lucent lesions, but no pathologic fracture identified. Appearance compatible with widespread multiple myeloma (favored) or  bone metastases.  Original Report Authenticated By: Harley Hallmark, M.D.   Mr Lumbar Spine Wo Contrast  04/04/2012  *RADIOLOGY REPORT*  Clinical Data: Low back pain and lower extremity weakness.  MRI LUMBAR SPINE WITHOUT CONTRAST  Technique:  Multiplanar and multiecho pulse sequences of the lumbar spine were obtained without intravenous contrast.  Comparison: Radiographs dated 03/19/2012 and CT scan of the abdomen dated 10/05/2008  Findings: The scan extends from T11-12 through S3.  Tip of the conus is at L1 and appears normal.  There are innumerable lesions throughout the visualized bones. There is a pathologic Schmorl's node in the inferior aspect of L3. The appearance is most typical for multiple myeloma.  T11-12:  The disc is normal.  T12-L1:  There is a Schmorl's node in the inferior endplate of T12 which may be related to the numerous lesions.  No disc bulging.  L1-2:  Small annular disc tear asymmetric bulge  of the left of midline with no neural impingement.  L2-3:  Broad-based disc bulge with a small protrusion into the right neural foramen.  Right L2 nerve exits without impingement. Slight narrowing of the spinal canal and right lateral recess without focal neural impingement.  L3-4: There is a 5 mm retrolisthesis of L3 on L4 with broad-based bulge of the disc narrowing the spinal canal slightly without focal neural impingement.  L4-5:  Slight disc space narrowing with a tiny broad-based disc bulge with osteophyte formation and slight hypertrophy of the left facet joint and ligamentum flavum.  L5-S1:  The disc is normal.  Moderate right and mild left facet arthritis.  There are numerous lesions in the sacrum.  Incidental note is made of a 2.7 cm gallstone.  Gallbladder wall is not thickened.  IMPRESSION: 1.  There are innumerable lesions throughout the entire visualized portion of the skeleton, most likely multiple myeloma. Metastatic cancer can give the same appearance. 2.  Probable pathologic Schmorl's  nodes in the inferior aspects of T12 and L3. 3.  No significant neural impingement in the lumbar spine.  Critical Value/emergent results were called by telephone at the time of interpretation on 04/04/2012  at 12:19 p.m.  to  Dr. Vassie Loll, who verbally acknowledged these results.  Original Report Authenticated By: Gwynn Burly, M.D.   US Renal  04/04/2012  *RADIOLOGY REPORT*  Clinical Data: Renal failure.  Hypertension.  RENAL/URINARY TRACT ULTRASOUND COMPLETE  Comparison:  CT of 10/05/2008  Findings:  Right Kidney:  10.6 cm. No hydronephrosis.  Normal renal cortical thickness and echogenicity.  Left Kidney:  11.1 cm. No hydronephrosis.  Normal renal cortical thickness and echogenicity.  9 mm cyst or minimally complex cyst in the lower pole left kidney.  Bladder:  Within normal limits.  Incidental note is made of a 2.5 cm gallstone, without wall thickening or pericholecystic fluid.  IMPRESSION:  1. No acute process or explanation for renal failure. 2.  Cholelithiasis.  Original Report Authenticated By: Consuello Bossier, M.D.    Scheduled Meds:   . atorvastatin  20 mg Oral q1800  . calcitonin  4 Units/kg Intramuscular Q12H  . docusate sodium  100 mg Oral BID  . metoCLOPramide  5 mg Oral TID AC  . predniSONE  20 mg Oral Q breakfast  . sodium chloride  3 mL Intravenous Q12H   Continuous Infusions:   . sodium chloride 125 mL/hr at 04/06/12 0100     Leanne Chang [facility] Pager [phone]  If 7PM-7AM, please contact night-coverage [email or web address] Password [email or web address] 04/06/2012, 5:33 AM   LOS: 3 days

## 2012-04-06 NOTE — Progress Notes (Signed)
OT Cancellation Note  Treatment cancelled today due to pt transferring to [facility].  Athleen Feltner A OTR/L [phone] 04/06/2012, 9:42 AM

## 2012-04-06 NOTE — Consult Note (Signed)
[facility] INPATIENT PROGRESS NOTE  Name: [name]      MRN: [id number]    Location: 3113/3113-01  Date: 04/06/2012 Time:6:09 PM   Subjective: Interval History:[name] fell when she was trying to get out of bed last night to go to bathroom.  She fell and hit her occipital area on the ground.  CT head showed right subdural hematoma.  Pt had mild to moderate headache this morning which resolved with pain med.  She denied diplopia or blurry vision.  Her son and grandson thought that her confusion has improved over the past few days with correction of her hypercalcemia.  The subdural hematoma did not appear to worsen her confusion.  She still has bilateral hip pain.    Objective: Vital signs in last 24 hours: Temp:  [97.4 F (36.3 C)-97.9 F (36.6 C)] 97.6 F (36.4 C) (07/10 1546) Pulse Rate:  [45-130] 89  (07/10 1800) Resp:  [12-23] 17  (07/10 1800) BP: (115-144)/(63-103) 131/74 mmHg (07/10 1800) SpO2:  [87 %-100 %] 92 % (07/10 1800)    Intake/Output from previous day: 07/09 0701 - 07/10 0700 In: 1840 [P.O.:340; I.V.:1500] Out: 1475 [Urine:1475]    Intake/Output this shift: Total I/O In: 2595.8 [I.V.:2595.8] Out: 1025 [Urine:1025]   PHYSICAL EXAM:  Gen: Well-nourished, in no acute distress. Eyes: No scleral icterus or jaundice. ENT: There was no oropharyngeal lesions. Neck was supple without thyromegaly. Lymphatics: Negative for cervical, supraclavicular, axillary, or inguinal adenopathy.  Respiratory: Lungs were clear bilaterally without wheezing or crackles. Cardiovascular: normal heart rate and rhythm; S1/S2; without murmur, rubs, or gallop. There was no pedal edema. GI: Abdomen was soft, flat, nontender, nondistended, without organomegaly. Musculoskeletal exam: No spinal tenderness on palpation of vertebral spine. Skin exam was without ecchymosis, petechiae. Neuro exam was nonfocal. CN II-XII grossly intact.   LABS: Cr 1.5; Ca 11.7.  WBC 11; Hgb  10.4; plt 199 SPEP:  Pending. IgA 3,880 mg/dL.  Spot UPEP:  Positive for Bence Jones protein.      MEDICATIONS: reviewed.    PROBLEM LISTS:  1. Hypertension  2. Hyperlipidemia.  3. Acute renal failure due to hypercalcemia and suspected multiple myeloma.  4. Hypercalcemia: Due to suspected multiple myeloma.  5. Moderate deconditioning due to bone pain.  6. Lytic bone lesions causing bone pain: From suspected multiple myeloma.  7. Acute subdural hematoma from recent fall.   IMPRESSIONS:  Multiple myeloma has variable prognosis.  The majority of patients (80%) have great response with chemotherapies.  The median survival can be up to 5 years.  The extend of lytic lesions in patients with myeloma does not predict survival and response to therapy.   The minority of patients may have high risk disease (such as chromosome 13 or 17 deletions) which have low response to chemo and even bone marrow transplant and subsequently limited survival.  Lab tests so far are suggestive of multiple myeloma; however, bone marrow biopsy is still indicated for definitive diagnosis and prognostication.    RECCOMMENDATIONS:  -  Agree with NeuroSurg for her to be on Prednisone both for her intracranial edema and suspected myeloma. -  If her subdural hematoma worsens requiring surgical evacuation, her suspected myeloma should not prevent her from aggressive surgical intervention as deemed appropriate by NeuroSurg. -  Proceed with bone marrow biopsy once she is deemed safe to leave ICU from NeuroSurg standpoint.  -  Continue IVF and possibly Lasix for hypercalcemia. -  She was cleared from Dental medicine  to receive pamidronate.  However, I would hold this off until she is more stable from Neuro standpoint in case she has infusion reaction with pamidronate.  -  Social:  I strongly recommend eval by PT/OT once stable to see if she is a candidate for SNF placement.

## 2012-04-06 NOTE — Progress Notes (Signed)
  CT HEAD WITHOUT CONTRAST IMPRESSION:  1. Predominantly acute subdural hematoma overlying the right  frontal and parietal lobes, extending to the convexity. This  measures 1.1 cm at the dominant acute component along the right  parietal lobe, and up to 1.6 cm at the convexity. Associated mass  effect, with up to 9 mm of leftward midline shift.  2. Minimal soft tissue swelling noted on the right side near the  vertex.  3. Scattered small lytic foci within the upper calvarium may  reflect multiple myeloma or less likely metastatic disease.  4. Mild small vessel ischemic microangiopathy.  CT CERVICAL SPINE IMPRESSION:  1. No evidence of acute fracture or subluxation along the cervical  spine.  2. Mild degenerative change noted along the lower cervical spine.  3. Calcification noted at the carotid bifurcations bilaterally.  Carotid ultrasound could be considered for further evaluation on an  elective non-emergent basis, when and as deemed clinically  appropriate.  Critical Value/emergent results were called by telephone at the  time of interpretation on 04/06/2012 at 06:41 a.m. to Tama Gander NP, who verbally acknowledged these results.  Original Report Authenticated By: Tonia Ghent, M.D.  Spoke w/ Dr Gerlene Fee w/ neurosurgery. He has requested we transfer pt to [facility] to Neuro ICU as soon as possible. Dr Marikay Alar will see pt upon her arrival there. Dr Molli Knock w/ PCCM was consulted and has accepted pt for admission into neuro ICU.

## 2012-04-06 NOTE — Consult Note (Signed)
Reason for Consult:SDH Referring Physician: [facility]  [name] is an 76 y.o. female.   HPI:  This is a 76 year old female who was admitted to [facility] couple of days ago with a three-month history of progressive back pain. MRI showed multiple lesions within the spine consistent with multiple myeloma. She was seen by oncology and that note has been read. Multiple labs have been sent and she was scheduled for bone marrow biopsy. About 4 AM this morning she got up to go to the bathroom when she fell and struck her head. She denies loss of consciousness. He did have some nausea and vomiting. Head CT showed a right-sided subdural hematoma that was mixed density. Dr. Aliene Beams was called and he asked that I assume her care from a neurosurgical standpoint today. The nausea has since resolved, and she denies headache at this time. She has had some recent confusion and disorientation according to the chart and the family, and the family states "I can't really tell any difference in her." I have spoken at length with her husband and son his morning, and history was taken from the chart and from the patient. She denies any numbness tingling or weakness. She denies any visual changes. She was transferred from [facility] to the neurosurgical ICU to the critical care service and for neurosurgical evaluation. She has never been a smoker. She did have a lumpectomy in 1962 but states it was benign.   Past Medical History  Diagnosis Date  . Anxiety   . Hypertension   . Hyperlipidemia   . Depression   . Osteopenia     Past Surgical History  Procedure Date  . Breast lumpectomy     Allergies  Allergen Reactions  . Simvastatin Other (See Comments)    Joint aches     History  Substance Use Topics  . Smoking status: Never Smoker   . Smokeless tobacco: Never Used  . Alcohol Use: No    Family History  Problem Relation Age of Onset  . Heart disease Mother   .  Hyperlipidemia Sister   . Hypertension Sister   . Diabetes Sister   . Diabetes Sister   . Hyperlipidemia Sister   . Hypertension Sister   . Sudden death Neg Hx   . Heart attack Neg Hx      Review of Systems  Positive ROS: back pain  All other systems have been reviewed and were otherwise negative with the exception of those mentioned in the HPI and as above.  Objective: Vital signs in last 24 hours: Temp:  [97.4 F (36.3 C)-98.2 F (36.8 C)] 97.5 F (36.4 C) (07/10 0800) Pulse Rate:  [88-98] 93  (07/10 0800) Resp:  [16-18] 16  (07/10 0800) BP: (123-144)/(77-88) 135/77 mmHg (07/10 0930) SpO2:  [92 %-97 %] 92 % (07/10 0800)  General Appearance: Alert, cooperative, no distress, appears stated age Head: Normocephalic, small swelling at the vertex Eyes: PERRL, conjunctiva/corneas clear, EOMI   Throat: benign Neck: Supple, non-tender Heart: Regular rate and rhythm Extremities: Extremities normal, atraumatic, no cyanosis or edema   NEUROLOGIC:   Mental status: A&O x4, no aphasia, good attention span, seems appropriate in speech and thought content. Occasionally there is some evidence of some mild disorientation she seems to correct herself fairly quickly. She is awake and alert and conversant, with appropriate interactions with her family Motor Exam - grossly normal, normal tone and bulk, lives legs off her bed, no pronator drift Sensory Exam -  grossly normal Gait - not tested Balance - not tested Cranial Nerves: I: smell Not tested  II: visual acuity  OS: NA    OD: NA  II: visual fields Full to confrontation  II: pupils Equal, round, reactive to light  III,VII: ptosis None  III,IV,VI: extraocular muscles  Full ROM  V: mastication Normal  V: facial light touch sensation  Normal  V,VII: corneal reflex  Present  VII: facial muscle function - upper  Normal  VII: facial muscle function - lower Normal  VIII: hearing Not tested  IX: soft palate elevation  Normal  IX,X: gag  reflex Present  XI: trapezius strength  5/5  XI: sternocleidomastoid strength 5/5  XI: neck flexion strength  5/5  XII: tongue strength  Normal    Data Review Lab Results  Component Value Date   WBC 11.0* 04/06/2012   HGB 10.4* 04/06/2012   HCT 30.8* 04/06/2012   MCV 91.9 04/06/2012   PLT 199 04/06/2012   Lab Results  Component Value Date   NA 133* 04/06/2012   K 4.3 04/06/2012   CL 96 04/06/2012   CO2 17* 04/06/2012   BUN 19 04/06/2012   CREATININE 1.54* 04/06/2012   GLUCOSE 106* 04/06/2012   Lab Results  Component Value Date   INR 1.33 04/06/2012    Radiology: Dg Orthopantogram  04/04/2012  *RADIOLOGY REPORT*  Clinical Data: 76 year old female.  Dental Pathology.  ORTHOPANTOGRAM/PANORAMIC  Comparison: None.  Findings: Residual dentition (incisors, canine, and left maxillary molar) demonstrate filling with no definite dental caries.  No peri apical lucency.  The remaining dentition is absent.  IMPRESSION: Dental fillings in the residual dentition.  No definite dental caries or peri apical lucency.  Original Report Authenticated By: Harley Hallmark, M.D.   X-ray Chest Pa And Lateral   04/04/2012  *RADIOLOGY REPORT*  Clinical Data: 76 year old female with cough shortness of breath chest pain.  CHEST - 2 VIEW  Comparison: 04/11/2007.  Lumbar MRI from 04/04/2012.  Findings: Upright AP and lateral views of the chest.  There is a peripheral right lung 6 cm mass-like opacity.  This may be pleural based or might be extrapleural.  Mediastinal and hilar contours appear stable and within normal limits. The anterior ribs in the region of the mass are indistinct.  The left lung appears stable and clear. Visualized tracheal air column is within normal limits.  No definite other osseous lesion identified.  IMPRESSION: 1.  Mass-like opacity in the right hemithorax, may be pleural based or could be extrapleural (indeterminate adjacent rib involvement). In light of the lumbar findings earlier today, top  differential considerations are multiple myeloma versus bronchogenic carcinoma of the right chest.  Chest CT (IV contrast preferred) would characterize further. 2.  No other cardiopulmonary abnormality identified.  Original Report Authenticated By: Harley Hallmark, M.D.   Dg Hip Bilateral W/pelvis  04/04/2012  *RADIOLOGY REPORT*  Clinical Data: 76 year old female with pain.  Abnormal lumbar MRI.  BILATERAL HIP WITH PELVIS - 4+ VIEW  Comparison: Lumbar MRI from the same day reported separately. Lumbar radiographs 03/19/2012.  Findings: A subtle but asymmetric decreased bone mineralization of the proximal right femur.  A small (3-4 mm) and lucent lesions are noted in both intertrochanteric regions.  No proximal femoral pathologic fracture is identified.  There may be a small lucent lesions of the left iliac crest versus overlying bowel gas.  No pelvic fracture is identified.  Bone mineralization in the pelvis is similar to that seen in the visible  lumbar spine.  IMPRESSION: Decreased bone mineralization in the pelvis and proximal femurs similar to that in the lumbar spine with possible superimposed small lucent lesions, but no pathologic fracture identified. Appearance compatible with widespread multiple myeloma (favored) or bone metastases.  Original Report Authenticated By: Harley Hallmark, M.D.   Mr Lumbar Spine Wo Contrast  04/04/2012  *RADIOLOGY REPORT*  Clinical Data: Low back pain and lower extremity weakness.  MRI LUMBAR SPINE WITHOUT CONTRAST  Technique:  Multiplanar and multiecho pulse sequences of the lumbar spine were obtained without intravenous contrast.  Comparison: Radiographs dated 03/19/2012 and CT scan of the abdomen dated 10/05/2008  Findings: The scan extends from T11-12 through S3.  Tip of the conus is at L1 and appears normal.  There are innumerable lesions throughout the visualized bones. There is a pathologic Schmorl's node in the inferior aspect of L3. The appearance is most typical for  multiple myeloma.  T11-12:  The disc is normal.  T12-L1:  There is a Schmorl's node in the inferior endplate of T12 which may be related to the numerous lesions.  No disc bulging.  L1-2:  Small annular disc tear asymmetric bulge of the left of midline with no neural impingement.  L2-3:  Broad-based disc bulge with a small protrusion into the right neural foramen.  Right L2 nerve exits without impingement. Slight narrowing of the spinal canal and right lateral recess without focal neural impingement.  L3-4: There is a 5 mm retrolisthesis of L3 on L4 with broad-based bulge of the disc narrowing the spinal canal slightly without focal neural impingement.  L4-5:  Slight disc space narrowing with a tiny broad-based disc bulge with osteophyte formation and slight hypertrophy of the left facet joint and ligamentum flavum.  L5-S1:  The disc is normal.  Moderate right and mild left facet arthritis.  There are numerous lesions in the sacrum.  Incidental note is made of a 2.7 cm gallstone.  Gallbladder wall is not thickened.  IMPRESSION: 1.  There are innumerable lesions throughout the entire visualized portion of the skeleton, most likely multiple myeloma. Metastatic cancer can give the same appearance. 2.  Probable pathologic Schmorl's nodes in the inferior aspects of T12 and L3. 3.  No significant neural impingement in the lumbar spine.  Critical Value/emergent results were called by telephone at the time of interpretation on 04/04/2012  at 12:19 p.m.  to  Dr. Vassie Loll, who verbally acknowledged these results.  Original Report Authenticated By: Gwynn Burly, M.D.   US Renal  04/04/2012  *RADIOLOGY REPORT*  Clinical Data: Renal failure.  Hypertension.  RENAL/URINARY TRACT ULTRASOUND COMPLETE  Comparison:  CT of 10/05/2008  Findings:  Right Kidney:  10.6 cm. No hydronephrosis.  Normal renal cortical thickness and echogenicity.  Left Kidney:  11.1 cm. No hydronephrosis.  Normal renal cortical thickness and  echogenicity.  9 mm cyst or minimally complex cyst in the lower pole left kidney.  Bladder:  Within normal limits.  Incidental note is made of a 2.5 cm gallstone, without wall thickening or pericholecystic fluid.  IMPRESSION:  1. No acute process or explanation for renal failure. 2.  Cholelithiasis.  Original Report Authenticated By: Consuello Bossier, M.D.   CT scan: She has multiple lytic lesions throughout the skull, she has a moderate sized right subdural hematoma of mixed density. It has mild mass effect and mild shift. Basal cisterns are open.  Assessment/Plan:  Very complex decision making in a 63 -year-old female with probable multiple myeloma with  diffuse bony involvement and unclear prognosis (I spoke at length with Dr. Gaylyn Rong who dated it is difficult to know clear prognosis without a bone marrow biopsy, results of which take 2-3 weeks to come back because of chromosome culture, but on average these patients can live 3-5 years), who has a small to moderate right-sided subdural hematoma of mixed density. Clinically, she is doing quite well and indeed her family states that they can tell no difference in her. Given the situation I would tend toward not operating on the subdural hematoma Acutely. I would favor watching her in the ICU and repeating her CT scan and a few hours and again tomorrow morning. If she were to change neurologically and deteriorate in any way to consider craniotomy at that time. Otherwise, I would prefer to allow this hematoma to resolve or liquefy over time. I've explained to the family that this lesion could resolve, acutely grow, or chronically grow as a chronic subdural hematoma, and it is difficult to predict which may happen. Her myeloma is quite diffuse and quite painful to her, and that makes this decision making very difficult. I am not sure that it would be appropriate to all for aggressive management for a subdural hematoma if it were going to save her to a slow painful  decline from diffuse myeloma.   Aariona Momon S 04/06/2012 9:56 AM

## 2012-04-06 NOTE — Consult Note (Signed)
Name: [name] MRN: [id number] DOB: 06/22/1936    LOS: 3  Referring Provider:  [facility]  Reason for Referral:  ICU tx, SDH   PULMONARY / CRITICAL CARE MEDICINE  HPI:   76yo female with hx anxiety, HTN presented 7/8 with 3 month hx back pain, extremity weakness and urinary incontinence.  She was admitted to [facility] by [facility] and workup revealed acute renal failure, hypercalcemia, proteinuria and MRI L spine showed innumerable lesions and heme/onc was consulted for workup for multiple myeloma.   Pt was improving and unfortunately sustained an unwitnessed fall 7/10 with subsequent subdural hematoma.  She was tx to [facility] for neurosurgery evaluation and PCCM asked to assume care.   Past Medical History  Diagnosis Date  . Anxiety   . Hypertension   . Hyperlipidemia   . Depression   . Osteopenia    Past Surgical History  Procedure Date  . Breast lumpectomy    Prior to Admission medications   Medication Sig Start Date End Date Taking? Authorizing Provider  ALPRAZolam Prudy Feeler) 1 MG tablet Take 1 mg by mouth at bedtime as needed. Sleep.   Yes Historical Provider, MD  butalbital-aspirin-caffeine [facility]) 50-325-40 MG per capsule Take 1 capsule by mouth 2 (two) times daily as needed. Headache.   Yes Historical Provider, MD  cyclobenzaprine (FLEXERIL) 5 MG tablet Take 5 mg by mouth 3 (three) times daily as needed. Muscle spasms.   Yes Historical Provider, MD  meloxicam (MOBIC) 15 MG tablet Take 15 mg by mouth daily.   Yes Historical Provider, MD  Olmesartan-Amlodipine-HCTZ (TRIBENZOR) 40-10-12.5 MG TABS Take 1 tablet by mouth daily.   Yes Historical Provider, MD  oxyCODONE-acetaminophen (PERCOCET) 7.5-325 MG per tablet Take 1 tablet by mouth every 4 (four) hours as needed. Every 4 to 6 hours as needed for pain.   Yes Historical Provider, MD  QUEtiapine (SEROQUEL) 100 MG tablet Take 100 mg by mouth at bedtime.   Yes Historical Provider, MD  rosuvastatin (CRESTOR) 10 MG tablet Take 10 mg  by mouth daily.   Yes Historical Provider, MD  traMADol (ULTRAM) 50 MG tablet Take 25 mg by mouth at bedtime as needed. Pain.   Yes Historical Provider, MD  predniSONE (STERAPRED UNI-PAK) 10 MG tablet 6 tabs po day 1, 5 tabs po day 2, 4 tabs po day 3, 3 tabs po day 4, 2 tabs po day 5, 1 tab po day 6 03/21/12   Lenda Kelp, MD   Allergies Allergies  Allergen Reactions  . Simvastatin Other (See Comments)    Joint aches     Family History Family History  Problem Relation Age of Onset  . Heart disease Mother   . Hyperlipidemia Sister   . Hypertension Sister   . Diabetes Sister   . Diabetes Sister   . Hyperlipidemia Sister   . Hypertension Sister   . Sudden death Neg Hx   . Heart attack Neg Hx    Social History  reports that she has never smoked. She has never used smokeless tobacco. She reports that she does not drink alcohol or use illicit drugs.  Review Of Systems:  Denies pain currently.  C/o weakness.  Denies chest pain, SOB.  All other systems reviewed and were neg.    Vital Signs: Temp:  [97.4 F (36.3 C)-98.2 F (36.8 C)] 97.5 F (36.4 C) (07/10 0800) Pulse Rate:  [88-98] 93  (07/10 0800) Resp:  [16-18] 16  (07/10 0800) BP: (123-144)/(77-88) 135/77 mmHg (07/10 0930)  SpO2:  [92 %-97 %] 92 % (07/10 0800)  Physical Examination: General: chronically ill appearing female, NAD  Neuro: drowsy, arousable, appropriate, MAE, weakness BLE  CV: s1s2 rrr PULM: resps even non labored, diminished bases GI: abd soft, +bs Extremities:  Warm and dry, no edema    Active Problems:  HYPERTENSION  BACK PAIN, LUMBAR  Dehydration  Acute renal failure  Hypercalcemia  Hip pain  Anemia   ASSESSMENT AND PLAN   PULMONARY   CXR:  none ETT:  none  A:  R lung mass - assumed r/t mult myeloma.  No resp compromise  P:   CT chest (no contrast r/t renal failure) F/u CXR int pulm hygiene Monitor neuro/ resp status with SDH     CARDIOVASCULAR  ECG:  NSR Lines:  none  A: HTN P:  Monitor off rx      RENAL  Lab 04/06/12 0420 04/05/12 0408 04/04/12 0420 04/03/12 2237  NA 133* 136 135 135  K 4.3 4.4 -- --  CL 96 100 96 96  CO2 17* 21 23 24   BUN 19 21 29* 32*  CREATININE 1.54* 1.88* 2.34* 2.45*  CALCIUM 11.7* 11.6* 11.8*14.0* 14.7*  MG -- -- 2.5 --  PHOS -- -- 5.1* --   Intake/Output      07/09 0701 - 07/10 0700 07/10 0701 - 07/11 0700   P.O. 340    I.V. (mL/kg) 1500 (18.8) 1750 (22)   Total Intake(mL/kg) 1840 (23.1) 1750 (22)   Urine (mL/kg/hr) 1475 (0.8)    Total Output 1475    Net +365 +1750        Urine Occurrence 1 x 1 x    Foley:    A:  Acute renal insufficiency - r/t mult myeloma, improving P:   F/u chem  Cont IVF     GASTROINTESTINAL  Lab 04/04/12 0420  AST 16  ALT 11  ALKPHOS 60  BILITOT 0.2*  PROT 9.7*  ALBUMIN 2.7*    A:  Nausea - improved  P:   PRN zofran    HEMATOLOGIC  Lab 04/06/12 0420 04/04/12 0420 04/03/12 2237  HGB 10.4* 10.8* 10.8*  HCT 30.8* 31.6* 31.4*  PLT 199 185 203  INR 1.33 -- --  APTT 36 -- --   A: Presumed multiple myeloma anemia P:  Oncology (Ha) following  SPEP, UPEP pending Needs bone marrow bx for definitive dx  Once euvolemic consider pamidronate (renally dosed - 60mg  IV over 6 hrs) If fails chemo alone may need palliative radiation therapy for pain  Cont prednisone  F/u cbc    INFECTIOUS  Lab 04/06/12 0420 04/04/12 0420 04/03/12 2237  WBC 11.0* 7.4 8.6  PROCALCITON -- -- --   Cultures: none Antibiotics: none  A:  No acute issue P:   Monitor fever, WBC trend   ENDOCRINE A:  No acute issue P:   F/u chem    NEUROLOGIC  A:   SDH - in setting fall Severe pain - r/t mult bone lesions presumed r/t mult myeloma  P:   Nsgy to see  Cont current pain rx SCD's for DVT proph    BEST PRACTICE / DISPOSITION Level of Care:  ICU Primary Service:  PCCM Consultants:  Neurosgy, heme/onc Code Status:  full Diet:  npo DVT Px:  SCD's GI Px:   n/a Skin Integrity:  intact Social / Family:  Husband updated at bedside    [facility], NP 04/06/2012  9:40 AM Pager: [phone]  *Care during the described  time interval was provided by me and/or other providers on the critical care team. I have reviewed this patient's available data, including medical history, events of note, physical examination and test results as part of my evaluation.   Patient transferred to NICU for evaluation of the SDH after a fall.  Neurosurgery is not taking patient to the OR.  However, will repeat CT again in AM and if worsens then will reconsider.  However, the factor that is clouding the situation here is this potential (and likely) diagnosis of multiple myeloma.  Will review with oncology.  Alyson Reedy, M.D. [facility] Pulmonary/Critical Care Medicine. Pager: [phone]. After hours pager: [phone].

## 2012-04-06 NOTE — Progress Notes (Signed)
Patient ID: [name], female   DOB: 01-17-1936, 76 y.o.   MRN: [id number]   Radiology aware of BM Bx request  Pt with probable MM. Was scheduled for bone marrow bx while an IP at [facility] until recent fall and SDH. Now has been transferred to [facility] in N ICU.  We are awaiting result from CT Head in am And note from Oncology to evaluate If/when BM bx should be performed.  We will check chart in am

## 2012-04-07 ENCOUNTER — Inpatient Hospital Stay (HOSPITAL_COMMUNITY): Payer: Medicare Other

## 2012-04-07 ENCOUNTER — Encounter: Payer: Medicare Other | Admitting: Physical Therapy

## 2012-04-07 ENCOUNTER — Encounter (HOSPITAL_COMMUNITY): Payer: Self-pay | Admitting: Radiology

## 2012-04-07 DIAGNOSIS — M25559 Pain in unspecified hip: Secondary | ICD-10-CM

## 2012-04-07 LAB — PHOSPHORUS: Phosphorus: 2.7 mg/dL (ref 2.3–4.6)

## 2012-04-07 LAB — BASIC METABOLIC PANEL
CO2: 20 mEq/L (ref 19–32)
Chloride: 101 mEq/L (ref 96–112)
GFR calc Af Amer: 50 mL/min — ABNORMAL LOW (ref >90)
Potassium: 4 mEq/L (ref 3.5–5.1)
Sodium: 138 mEq/L (ref 135–145)

## 2012-04-07 LAB — CBC
Platelets: 169 10*3/uL (ref 150–400)
RBC: 3.27 MIL/uL — ABNORMAL LOW (ref 3.87–5.11)
RDW: 12.9 % (ref 11.5–15.5)
WBC: 8.7 10*3/uL (ref 4.0–10.5)

## 2012-04-07 LAB — MAGNESIUM: Magnesium: 1.8 mg/dL (ref 1.5–2.5)

## 2012-04-07 MED ORDER — MAGNESIUM SULFATE 40 MG/ML IJ SOLN
2.0000 g | Freq: Once | INTRAMUSCULAR | Status: AC
  Administered 2012-04-07: 2 g via INTRAVENOUS
  Filled 2012-04-07: qty 50

## 2012-04-07 NOTE — Progress Notes (Signed)
Pt arrived from 3113, oriented to unit and routines, safety concerns discussed, she verbalizes understanding.  Discussed calling husband to ensure he was aware of transfer

## 2012-04-07 NOTE — Progress Notes (Signed)
Chaplain Note:  Chaplain had a follow up visit with [name] and [name]'s family.  [name] was resting in bed.  [name]'s spouse and son were at bedside. Chaplain provided spiritual comfort and support for [name] and [name]'s family, all of whom expressed appreciation for chaplain support. Spiritual Assessment:  [name] and family are dealing with the stress that comes from waiting on the results of tests.  They are managing it well.  The main concern is over the type and stage of the [name]'s recently discovered cancer.  The family is vocal about hope for curative treatment.  The [name] has only expressed feeling shocked about the the diagnosis. They maintain hope and lean on their faith, which is an important part of the [name]'s and family's life.  Chaplain will follow up as needed.   04/07/12 1200  Clinical Encounter Type  Visited With Patient and family together  Visit Type Spiritual support;Follow-up  Referral From Other (Comment) (Alesa Echevarria referral)  Spiritual Encounters  Spiritual Needs Emotional  Stress Factors  Patient Stress Factors Major life changes;Health changes;Lack of knowledge  Family Stress Factors Exhausted;Loss of control;Major life changes    Verdie Shire, chaplain resident [phone]

## 2012-04-07 NOTE — Progress Notes (Signed)
CT head today reveals improvement in SDH size.  Patient remains in NICU.  Awaiting neuro clearance for BM biopsy. Once this has been determined - please call CT at [phone] so that we can schedule biopsy with the lab.

## 2012-04-07 NOTE — Progress Notes (Signed)
Patient ID: [name], female   DOB: [birth year], 76 y.o.   MRN: [id number] Subjective: Patient reports feeling good  Objective: Vital signs in last 24 hours: Temp:  [97.6 F (36.4 C)-98.8 F (37.1 C)] 98.8 F (37.1 C) (07/11 1548) Pulse Rate:  [75-108] 100  (07/11 1548) Resp:  [14-24] 20  (07/11 1548) BP: (89-159)/(54-101) 134/71 mmHg (07/11 1548) SpO2:  [95 %-100 %] 98 % (07/11 1548)  Intake/Output from previous day: 07/10 0701 - 07/11 0700 In: 3750.8 [P.O.:180; I.V.:3570.8] Out: 1900 [Urine:1900] Intake/Output this shift:    Awake, alert,conversnat. No focal weakness.  Lab Results:  [facility] 04/07/12 0510 04/06/12 0420  WBC 8.7 11.0*  HGB 10.3* 10.4*  HCT 30.0* 30.8*  PLT 169 199   BMET  Basename 04/07/12 0510 04/06/12 0420  NA 138 133*  K 4.0 4.3  CL 101 96  CO2 20 17*  GLUCOSE 81 106*  BUN 15 19  CREATININE 1.21* 1.54*  CALCIUM 11.4* 11.7*    Studies/Results: Ct Head Wo Contrast  04/07/2012  *RADIOLOGY REPORT*  Clinical Data: Follow-up acute subdural hematoma.  CT HEAD WITHOUT CONTRAST  Technique:  Contiguous axial images were obtained from the base of the skull through the vertex without contrast.  Comparison: Prior scans from 04/06/2012.  Findings: Considerable motion degradation.  Overall study marginally diagnostic.  Slight improvement acute right subdural hematoma.  Thickness as measured at the level of the corpus callosum over the right frontotemporal convexity image 17 is 7.4 mm as compared with 10 - 11 mm previously.  Slight midline shift of 3-4 mm also appears improved.  Aside from midline shift, the brain is grossly within normal limits although motion significantly limits detection.  IMPRESSION: Slight improvement acute right subdural.  As measured on image 17, the hematoma thickness in the right frontotemporal region is 7 mm, decreased from 10-11 mm.  Original Report Authenticated By: Elsie Stain, M.D.   Ct Head Wo Contrast  04/06/2012   *RADIOLOGY REPORT*  Clinical Data: Follow-up subdural hematoma.  CT HEAD WITHOUT CONTRAST  Technique:  Contiguous axial images were obtained from the base of the skull through the vertex without contrast.  Comparison: CT head earlier in the day.  Findings: Predominantly acute right subdural hematoma overlying the frontal and parietal lobes extending to the convexity is redemonstrated.  Thickness as measured on image 22 is 12 mm in  the right posterior frontal region, unchanged from the scan 6 hours earlier. Midline shift as measured at the level of the septum pellucidum (image 15) is 7 mm right-to-left, also similar to priors.  There is mild atrophy with chronic microvascular ischemic change. Right-sided scalp hematoma redemonstrated.  Multiple punched out lesions in the  calvarium are indeterminate with differential as previously discussed for myeloma versus possible metastatic disease.  IMPRESSION: Stable predominately acute right subdural hematoma.  12 mm thick as measured on image 22 right posterior frontal region.  7 mm right-to- left midline shift.  Original Report Authenticated By: Elsie Stain, M.D.   Ct Head Wo Contrast  04/06/2012  *RADIOLOGY REPORT*  Clinical Data:  Status post fall; hit posterior head.  Concern for head or cervical spine injury.  CT HEAD WITHOUT CONTRAST AND CT CERVICAL SPINE WITHOUT CONTRAST  Technique:  Multidetector CT imaging of the head and cervical spine was performed following the standard protocol without intravenous contrast.  Multiplanar CT image reconstructions of the cervical spine were also generated.  Comparison: CT of the head performed 01/06/2005  CT HEAD  Findings:  There is a predominantly acute subdural hematoma overlying the right frontal parietal lobes, extending to the convexity.  This measures approximately 1.1 cm at the dominant acute component along the right parietal lobe, and up to 1.6 cm at the convexity.  Associated mass effect is noted, with up to 9 mm  of leftward midline shift. There is no evidence of hydrocephalus.  No additional hemorrhage is identified.  There is no evidence of mass lesion; no acute infarct is identified.  Mild subcortical and periventricular white matter change likely reflects small vessel ischemic microangiopathy.  Mild cerebellar atrophy is noted.  The brainstem and fourth ventricle are within normal limits.  The basal ganglia are unremarkable in appearance.  There is no evidence of fracture; scattered small lytic foci within the upper calvarium may reflect multiple myeloma or less likely metastatic disease.  The orbits are within normal limits.  The paranasal sinuses and mastoid air cells are well-aerated.  Minimal soft tissue swelling is noted on the right side near the vertex.  IMPRESSION:  1.  Predominantly acute subdural hematoma overlying the right frontal and parietal lobes, extending to the convexity.  This measures 1.1 cm at the dominant acute component along the right parietal lobe, and up to 1.6 cm at the convexity.  Associated mass effect, with up to 9 mm of leftward midline shift. 2.  Minimal soft tissue swelling noted on the right side near the vertex. 3.  Scattered small lytic foci within the upper calvarium may reflect multiple myeloma or less likely metastatic disease. 4.  Mild small vessel ischemic microangiopathy.  CT CERVICAL SPINE  Findings: There is no evidence of acute fracture or subluxation. There is minimal grade 1 anterolisthesis of C4 on C5, and mild narrowing of the intervertebral disc spaces at C5-C6 and C6-C7, with small associated anterior and posterior disc osteophyte complexes.  Vertebral bodies otherwise demonstrate normal height and alignment. Prevertebral soft tissues are within normal limits.  The thyroid gland is unremarkable in appearance.  The visualized lung apices are clear.  Calcification is noted at the carotid bifurcations bilaterally.  IMPRESSION:  1.  No evidence of acute fracture or  subluxation along the cervical spine. 2.  Mild degenerative change noted along the lower cervical spine. 3.  Calcification noted at the carotid bifurcations bilaterally. Carotid ultrasound could be considered for further evaluation on an elective non-emergent basis, when and as deemed clinically appropriate.  Critical Value/emergent results were called by telephone at the time of interpretation on 04/06/2012  at 06:41 a.m.  to  Tama Gander NP, who verbally acknowledged these results.  Original Report Authenticated By: Tonia Ghent, M.D.   Ct Chest Wo Contrast  04/06/2012  *RADIOLOGY REPORT*  Clinical Data: Right lung mass.  CT CHEST WITHOUT CONTRAST  Technique:  Multidetector CT imaging of the chest was performed following the standard protocol without IV contrast.  Comparison: Chest x-ray dated 04/04/2012  Findings: There is a 4.4 x 5.7 x 5.2 cm mass destroying the lateral aspect of the right seventh rib.  It extends into the right hemithorax and compresses the adjacent lung.   There is no hilar or mediastinal adenopathy.  Minimal atelectasis at the left lung base.  Tiny right effusion.  There are multiple subtle tiny lesions in the thoracic spine consistent with the lesions seen on the lumbar MRI dated 04/04/2012.  The heart is at the upper limits of normal in size.  There is a tiny hiatal hernia.  Visualized portion of the upper  abdomen is normal.  IMPRESSION:  1.  Numerous tiny lesions throughout the thoracic spine consistent with multiple myeloma. 2.  Large soft tissue mass destroying the lateral aspect of the right seventh rib, most likely a large lesion of multiple myeloma (plasmocytoma).  Original Report Authenticated By: Gwynn Burly, M.D.   Ct Cervical Spine Wo Contrast  04/06/2012  *RADIOLOGY REPORT*  Clinical Data:  Status post fall; hit posterior head.  Concern for head or cervical spine injury.  CT HEAD WITHOUT CONTRAST AND CT CERVICAL SPINE WITHOUT CONTRAST  Technique:  Multidetector  CT imaging of the head and cervical spine was performed following the standard protocol without intravenous contrast.  Multiplanar CT image reconstructions of the cervical spine were also generated.  Comparison: CT of the head performed 01/06/2005  CT HEAD  Findings:  There is a predominantly acute subdural hematoma overlying the right frontal parietal lobes, extending to the convexity.  This measures approximately 1.1 cm at the dominant acute component along the right parietal lobe, and up to 1.6 cm at the convexity.  Associated mass effect is noted, with up to 9 mm of leftward midline shift. There is no evidence of hydrocephalus.  No additional hemorrhage is identified.  There is no evidence of mass lesion; no acute infarct is identified.  Mild subcortical and periventricular white matter change likely reflects small vessel ischemic microangiopathy.  Mild cerebellar atrophy is noted.  The brainstem and fourth ventricle are within normal limits.  The basal ganglia are unremarkable in appearance.  There is no evidence of fracture; scattered small lytic foci within the upper calvarium may reflect multiple myeloma or less likely metastatic disease.  The orbits are within normal limits.  The paranasal sinuses and mastoid air cells are well-aerated.  Minimal soft tissue swelling is noted on the right side near the vertex.  IMPRESSION:  1.  Predominantly acute subdural hematoma overlying the right frontal and parietal lobes, extending to the convexity.  This measures 1.1 cm at the dominant acute component along the right parietal lobe, and up to 1.6 cm at the convexity.  Associated mass effect, with up to 9 mm of leftward midline shift. 2.  Minimal soft tissue swelling noted on the right side near the vertex. 3.  Scattered small lytic foci within the upper calvarium may reflect multiple myeloma or less likely metastatic disease. 4.  Mild small vessel ischemic microangiopathy.  CT CERVICAL SPINE  Findings: There is no  evidence of acute fracture or subluxation. There is minimal grade 1 anterolisthesis of C4 on C5, and mild narrowing of the intervertebral disc spaces at C5-C6 and C6-C7, with small associated anterior and posterior disc osteophyte complexes.  Vertebral bodies otherwise demonstrate normal height and alignment. Prevertebral soft tissues are within normal limits.  The thyroid gland is unremarkable in appearance.  The visualized lung apices are clear.  Calcification is noted at the carotid bifurcations bilaterally.  IMPRESSION:  1.  No evidence of acute fracture or subluxation along the cervical spine. 2.  Mild degenerative change noted along the lower cervical spine. 3.  Calcification noted at the carotid bifurcations bilaterally. Carotid ultrasound could be considered for further evaluation on an elective non-emergent basis, when and as deemed clinically appropriate.  Critical Value/emergent results were called by telephone at the time of interpretation on 04/06/2012  at 06:41 a.m.  to  Tama Gander NP, who verbally acknowledged these results.  Original Report Authenticated By: Tonia Ghent, M.D.    Assessment/Plan: Doing well. No need for  surgery at this time. For bone marrow biopsy tomorrow.  LOS: 4 days  as above   Reinaldo Meeker, MD 04/07/2012, 7:37 PM

## 2012-04-07 NOTE — Progress Notes (Signed)
Received call that neuro has cleared patient for BM biopsy - this has been set up with the lab for 04/08/12 am in CT at 0900.  Orders placed for NPO after MN except meds.  Consent in chart

## 2012-04-07 NOTE — Progress Notes (Signed)
OT Cancellation Note  Treatment cancelled today due to  Pt. Currently on bedrest.  MD, please indicate when pt. Safe to advance activity.  Thank you.  Jeani Hawking [phone] 04/07/2012, 2:45 PM

## 2012-04-07 NOTE — Consult Note (Addendum)
Name: [name] MRN: [id number] DOB: [birth year]    LOS: 4  Referring Provider:  [facility]  Reason for Referral:  ICU tx, SDH   PULMONARY / CRITICAL CARE MEDICINE  HPI:   76yo female with hx anxiety, HTN presented 7/8 with 3 month hx back pain, extremity weakness and urinary incontinence.  She was admitted to [facility] by [facility] and workup revealed acute renal failure, hypercalcemia, proteinuria and MRI L spine showed innumerable lesions and heme/onc was consulted for workup for multiple myeloma.   Pt was improving and unfortunately sustained an unwitnessed fall 7/10 with subsequent subdural hematoma.  She was tx to [facility] for neurosurgery evaluation and PCCM asked to assume care.   Vital Signs: Temp:  [97.6 F (36.4 C)-98.9 F (37.2 C)] 97.8 F (36.6 C) (07/11 0700) Pulse Rate:  [75-130] 90  (07/11 1200) Resp:  [12-24] 24  (07/11 1200) BP: (89-141)/(54-101) 141/88 mmHg (07/11 1200) SpO2:  [87 %-100 %] 97 % (07/11 1200)  Physical Examination: General: chronically ill appearing female, NAD  Neuro: drowsy, arousable, appropriate, MAE, weakness BLE  CV: s1s2 rrr PULM: resps even non labored, diminished bases GI: abd soft, +bs Extremities:  Warm and dry, no edema   Active Problems:  HYPERTENSION  BACK PAIN, LUMBAR  Dehydration  Acute renal failure  Hypercalcemia  Hip pain  Anemia  Subdural hematoma  ASSESSMENT AND PLAN  PULMONARY   CXR:  none ETT:  none  A:  R lung mass - assumed r/t mult myeloma.  No resp compromise  P:   CT chest shows likely hematoma from rib fracture due to MM. F/u CXR noted. Pulm hygiene. Monitor neuro/ resp status with SDH.  CARDIOVASCULAR  ECG:  NSR Lines: none  A: HTN P:  Monitor off rx   RENAL  Lab 04/07/12 0510 04/06/12 0420 04/05/12 0408 04/04/12 0420 04/03/12 2237  NA 138 133* 136 135 135  K 4.0 4.3 -- -- --  CL 101 96 100 96 96  CO2 20 17* 21 23 24   BUN 15 19 21  29* 32*  CREATININE 1.21* 1.54* 1.88* 2.34* 2.45*    CALCIUM 11.4* 11.7* 11.6* 11.8*14.0* 14.7*  MG 1.8 -- -- 2.5 --  PHOS 2.7 -- -- 5.1* --   Intake/Output      07/10 0701 - 07/11 0700 07/11 0701 - 07/12 0700   P.O. 180    I.V. (mL/kg) 3570.8 (44.9) 375 (4.7)   Total Intake(mL/kg) 3750.8 (47.1) 375 (4.7)   Urine (mL/kg/hr) 1900 (1) 650 (1.4)   Total Output 1900 650   Net +1850.8 -275        Urine Occurrence 3 x      Intake/Output Summary (Last 24 hours) at 04/07/12 1244 Last data filed at 04/07/12 1200  Gross per 24 hour  Intake 2000.83 ml  Output   2025 ml  Net -24.17 ml   Foley:    A:  Acute renal insufficiency - r/t mult myeloma, improving P:   F/u chem  Cont IVF  Keep fluid even as tolerated.  GASTROINTESTINAL  Lab 04/04/12 0420  AST 16  ALT 11  ALKPHOS 60  BILITOT 0.2*  PROT 9.7*  ALBUMIN 2.7*   A:  Nausea - improved  P:   PRN zofran  HEMATOLOGIC  Lab 04/07/12 0510 04/06/12 0420 04/04/12 0420 04/03/12 2237  HGB 10.3* 10.4* 10.8* 10.8*  HCT 30.0* 30.8* 31.6* 31.4*  PLT 169 199 185 203  INR -- 1.33 -- --  APTT -- 36 -- --  A: Presumed multiple myeloma anemia P:  Oncology (Ha) following  SPEP, UPEP pending Needs bone marrow bx for definitive dx  Once euvolemic consider pamidronate (renally dosed - 60mg  IV over 6 hrs) If fails chemo alone may need palliative radiation therapy for pain  Cont prednisone  F/u cbc Per H/O otherwise.  INFECTIOUS  Lab 04/07/12 0510 04/06/12 0420 04/04/12 0420 04/03/12 2237  WBC 8.7 11.0* 7.4 8.6  PROCALCITON -- -- -- --   Cultures: none Antibiotics: none  A:  No acute issue P:   Monitor fever, WBC trend  ENDOCRINE A:  No acute issue P:   F/u chem   NEUROLOGIC A:  SDH - in setting fall Severe pain - r/t mult bone lesions presumed r/t mult myeloma  P:   Nsgy input appreciated, no need for surgical intervention at this time. Cont current pain rx SCD's for DVT proph   BEST PRACTICE / DISPOSITION Level of Care:  ICU Primary Service:   PCCM Consultants:  Neurosgy, heme/onc Code Status:  full Diet:  Heart healthy diet DVT Px:  SCD's GI Px:  n/a Skin Integrity:  intact Social / Family:  Husband updated at bedside   Hematoma much improved, renal function improved, will transfer out of the ICU and to [facility] service as step down status.  Alyson Reedy, M.D. [facility] Pulmonary/Critical Care Medicine. Pager: [phone]. After hours pager: [phone].

## 2012-04-07 NOTE — Progress Notes (Signed)
PT Cancellation Note  Treatment cancelled today due to medical issues with patient which prohibited therapy. Pt is currently still on bedrest monitoring the bleed. Will attempt evaluation tomorrow pending medical stability.  Thanks, 04/07/2012 Milana Kidney DPT PAGER: [phone] OFFICE: [phone]    Milana Kidney 04/07/2012, 8:48 AM

## 2012-04-08 ENCOUNTER — Inpatient Hospital Stay (HOSPITAL_COMMUNITY): Payer: Medicare Other

## 2012-04-08 DIAGNOSIS — K5901 Slow transit constipation: Secondary | ICD-10-CM

## 2012-04-08 DIAGNOSIS — M898X9 Other specified disorders of bone, unspecified site: Secondary | ICD-10-CM | POA: Diagnosis present

## 2012-04-08 DIAGNOSIS — M899 Disorder of bone, unspecified: Secondary | ICD-10-CM | POA: Diagnosis present

## 2012-04-08 DIAGNOSIS — C9000 Multiple myeloma not having achieved remission: Secondary | ICD-10-CM

## 2012-04-08 LAB — CBC
Hemoglobin: 10.9 g/dL — ABNORMAL LOW (ref 12.0–15.0)
MCH: 31.2 pg (ref 26.0–34.0)
MCHC: 35 g/dL (ref 30.0–36.0)

## 2012-04-08 LAB — BASIC METABOLIC PANEL
Calcium: 11.2 mg/dL — ABNORMAL HIGH (ref 8.4–10.5)
GFR calc non Af Amer: 47 mL/min — ABNORMAL LOW (ref >90)
Glucose, Bld: 101 mg/dL — ABNORMAL HIGH (ref 70–99)
Sodium: 140 mEq/L (ref 135–145)

## 2012-04-08 LAB — PHOSPHORUS: Phosphorus: 2 mg/dL — ABNORMAL LOW (ref 2.3–4.6)

## 2012-04-08 MED ORDER — MAGNESIUM HYDROXIDE 400 MG/5ML PO SUSP
15.0000 mL | Freq: Every day | ORAL | Status: DC | PRN
  Administered 2012-04-08 – 2012-04-09 (×2): 15 mL via ORAL
  Filled 2012-04-08 (×2): qty 30

## 2012-04-08 MED ORDER — BISACODYL 10 MG RE SUPP
10.0000 mg | Freq: Every day | RECTAL | Status: DC | PRN
  Administered 2012-04-09: 10 mg via RECTAL
  Filled 2012-04-08: qty 1

## 2012-04-08 MED ORDER — FENTANYL CITRATE 0.05 MG/ML IJ SOLN
INTRAMUSCULAR | Status: AC | PRN
  Administered 2012-04-08: 25 ug via INTRAVENOUS

## 2012-04-08 MED ORDER — POLYETHYLENE GLYCOL 3350 17 G PO PACK
17.0000 g | PACK | Freq: Every day | ORAL | Status: DC
  Administered 2012-04-08 – 2012-04-11 (×4): 17 g via ORAL
  Filled 2012-04-08 (×5): qty 1

## 2012-04-08 MED ORDER — MIDAZOLAM HCL 5 MG/5ML IJ SOLN
INTRAMUSCULAR | Status: AC | PRN
  Administered 2012-04-08 (×3): 0.5 mg via INTRAVENOUS

## 2012-04-08 MED ORDER — FENTANYL CITRATE 0.05 MG/ML IJ SOLN
INTRAMUSCULAR | Status: AC
  Filled 2012-04-08: qty 4

## 2012-04-08 MED ORDER — MIDAZOLAM HCL 2 MG/2ML IJ SOLN
INTRAMUSCULAR | Status: AC
  Filled 2012-04-08: qty 4

## 2012-04-08 NOTE — Progress Notes (Signed)
Patient ID: [name], female   DOB: [birth year], 76 y.o.   MRN: [id number] Subjective: Patient reports feeling well without headache  Objective: Vital signs in last 24 hours: Temp:  [97.6 F (36.4 C)-98.8 F (37.1 C)] 98 F (36.7 C) (07/12 1427) Pulse Rate:  [82-108] 91  (07/12 1427) Resp:  [15-23] 18  (07/12 1427) BP: (122-158)/(67-91) 143/88 mmHg (07/12 1427) SpO2:  [96 %-100 %] 97 % (07/12 1427)  Intake/Output from previous day: 07/11 0701 - 07/12 0700 In: 1215 [I.V.:1165; IV Piggyback:50] Out: 1100 [Urine:1100] Intake/Output this shift: Total I/O In: -  Out: 200 [Urine:200]  awake, alert. No new neuro issues  Lab Results:  Basename 04/08/12 0420 04/07/12 0510  WBC 10.0 8.7  HGB 10.9* 10.3*  HCT 31.1* 30.0*  PLT 167 169   BMET  Basename 04/08/12 0420 04/07/12 0510  NA 140 138  K 3.5 4.0  CL 100 101  CO2 21 20  GLUCOSE 101* 81  BUN 17 15  CREATININE 1.12* 1.21*  CALCIUM 11.2* 11.4*    Studies/Results: Ct Head Wo Contrast  04/07/2012  *RADIOLOGY REPORT*  Clinical Data: Follow-up acute subdural hematoma.  CT HEAD WITHOUT CONTRAST  Technique:  Contiguous axial images were obtained from the base of the skull through the vertex without contrast.  Comparison: Prior scans from 04/06/2012.  Findings: Considerable motion degradation.  Overall study marginally diagnostic.  Slight improvement acute right subdural hematoma.  Thickness as measured at the level of the corpus callosum over the right frontotemporal convexity image 17 is 7.4 mm as compared with 10 - 11 mm previously.  Slight midline shift of 3-4 mm also appears improved.  Aside from midline shift, the brain is grossly within normal limits although motion significantly limits detection.  IMPRESSION: Slight improvement acute right subdural.  As measured on image 17, the hematoma thickness in the right frontotemporal region is 7 mm, decreased from 10-11 mm.  Original Report Authenticated By: Elsie Stain, M.D.     Assessment/Plan: Doing well from our stand point. We will sign off for now. Re contact if needed. Will be happy to follow up as out patient if feel we are needed.  LOS: 5 days  As above   Reinaldo Meeker, MD 04/08/2012, 2:50 PM

## 2012-04-08 NOTE — Progress Notes (Signed)
Back to room no complaints but confused and has a headache. Bandaid in tact to back no drainage

## 2012-04-08 NOTE — Progress Notes (Signed)
[facility] [place] TEAM 1 - Stepdown/ICU TEAM  PCP: Alden Hipp, M.D.  CODE STATUS: Full  Family communication: Son updated on transition of care from pulmonary critical care medicine to [facility]. In addition he was updated on the initiation of discharge planning including PT and OT evaluation in the process for which patient will be evaluated for skilled nursing facility versus rehabilitation facility. He was also updated on likelihood that biopsy results will not be available until Monday of next week at the earliest.  Consultants:  Dr. Kritzer/Neurosurgery Dr. Henn/Interventional Radiology Dr. Lucky Rathke Dr. Ha/Hematology  Interim history: 76yo female with hx anxiety, HTN presented 7/8 with 3 month hx back pain, extremity weakness and urinary incontinence. She was admitted to [facility] and workup revealed acute renal failure, hypercalcemia, proteinuria and MRI L spine showed innumerable lesions.  Heme/Onc was consulted for workup for multiple myeloma. Pt was improving and unfortunately sustained an unwitnessed fall 7/10 with subsequent subdural hematoma. She was tx to [facility] for Neurosurgery evaluation and PCCM asked to assume care. She remained neurologically stable and did not require any surgical intervention and was otherwise deemed appropriate to transfer out of ICU to the step down unit. [facility] assumed care of the patient on 04/08/2012.  Subjective: Patient is alert and very focused on wanting a diagnosis from her recent bone marrow biopsy. In addition she is also focused on the fact she has not had a bowel movement since admission. Apparently constipation was a problem prior to admission and she used a Dulcolax suppository as well as milk of magnesia at least once weekly to ensure she had a bowel movement. She is also complaining of pain at the bone marrow biopsy site and is still having the same hip pain she was having prior to  admission. No other complaints were verbalized.  Objective: Blood pressure 135/77, pulse 82, temperature 98.1 F (36.7 C), temperature source Oral, resp. rate 16, height 5\' 7"  (1.702 m), weight 79.6 kg (175 lb 7.8 oz), SpO2 98.00%.  Intake/Output from previous day: 07/11 0701 - 07/12 0700 In: 1215 [I.V.:1165; IV Piggyback:50] Out: 1100 [Urine:1100] Intake/Output this shift: Total I/O In: -  Out: 200 [Urine:200]  General appearance: alert, cooperative, appears stated age and no distress Resp: clear to auscultation bilaterally, on room air with O2 saturations 96-98% Cardio: regular rate and sinus rhythm, S1, S2 normal, no murmur, click, rub or gallop GI: soft, non-tender; bowel sounds normal; no masses,  no organomegaly Extremities: extremities normal, atraumatic, no cyanosis or edema Neurologic: Grossly normal  Lab Results:  Basename 04/08/12 0420 04/07/12 0510  WBC 10.0 8.7  HGB 10.9* 10.3*  HCT 31.1* 30.0*  PLT 167 169   BMET  Basename 04/08/12 0420 04/07/12 0510  NA 140 138  K 3.5 4.0  CL 100 101  CO2 21 20  GLUCOSE 101* 81  BUN 17 15  CREATININE 1.12* 1.21*  CALCIUM 11.2* 11.4*    Studies/Results: Ct Head Wo Contrast  04/07/2012  *RADIOLOGY REPORT*  Clinical Data: Follow-up acute subdural hematoma.  CT HEAD WITHOUT CONTRAST  Technique:  Contiguous axial images were obtained from the base of the skull through the vertex without contrast.  Comparison: Prior scans from 04/06/2012.  Findings: Considerable motion degradation.  Overall study marginally diagnostic.  Slight improvement acute right subdural hematoma.  Thickness as measured at the level of the corpus callosum over the right frontotemporal convexity image 17 is 7.4 mm as compared with 10 - 11 mm previously.  Slight midline shift of 3-4 mm also appears improved.  Aside from midline shift, the brain is grossly within normal limits although motion significantly limits detection.  IMPRESSION: Slight improvement acute  right subdural.  As measured on image 17, the hematoma thickness in the right frontotemporal region is 7 mm, decreased from 10-11 mm.  Original Report Authenticated By: Elsie Stain, M.D.   Medications: I have reviewed the patient's current medications.  Assessment/Plan:  Acute subdural hematoma after fall in hospital *Neurosurgery has been following and no indications for surgery - f/u CT head reveals decrease in size of hematoma *No neurological deficit clinically  Acute renal failure *Likely related to multiple myeloma *Creatinine has trended downward to 1.12 after a peak of 2.45  ? of Multiple myeloma *Patient underwent a bone marrow biopsy in interventional radiology 04/08/2012 *Dr. Gaylyn Rong with Hematology is following *Continue prednisone which is also being utilized to decrease cerebral edema post subdural hematoma *Large soft tissue mass destroying the lateral aspect of the seventh rib on the right most likely due to multiple myeloma  Dehydration Resolved w/ IVF resuscitation - NSL IVF and follow  Hypercalcemia *Likely secondary to unproven diagnosis of multiple myeloma *Calcium stable at 11.2 after hydration *She has been cleared by the dental service to receive pamidronate but Dr. Gaylyn Rong recommends holding off on this until she is more stable from a neurological standpoint in the event she has an infusion reaction from this medication  Hip pain/Lytic lesion of bone on x-ray *Likely due to multiple myeloma *Treat symptoms *Mobilize with the assistance of PT  HYPERTENSION *Blood pressure controlled *This is off medication  Anemia *Hemoglobin stable in the 10 range  Constipation due to slow transit/hypercalcemia *Chronic issue prior to admission and likely an indicator of decreased transit related to hypercalcemia influenced smooth muscle dysmotility *Patient had apparent excellent results with utilization of milk of magnesia and Dulcolax suppository at home so we'll reorder  these *Continue twice a day Colace and add at hour of sleep MiraLax *TSH is normal at 1.662  Disposition *stable for transfer to the Neuro floor/medical bed *Likely will need skilled nursing facility placement at time of discharge   LOS: 5 days   Junious Silk, ANP pager [phone]  [facility] 1 [email or web address] Password: [email or web address]  04/08/2012, 1:33 PM  I have personally examined this patient and reviewed the entire database. I have reviewed the above note, made any necessary editorial changes, and agree with its content.  Lonia Blood, MD [facility]

## 2012-04-08 NOTE — Progress Notes (Signed)
Occupational Therapy Treatment Patient Details Name: [name] MRN: [id number] DOB: 09-19-1936 Today's Date: 04/08/2012 Time: 4098-1191 OT Time Calculation (min): 37 min  OT Assessment / Plan / Recommendation Comments on Treatment Session Re-evaluation completed, pt overall min asisst for simulated selfcare tasks and functional transfers.  Feel she will continue to benefit from continued acute OT services as well as inpatient rehab consult for more extensive therapies.  Did not she needed mod instructional cueing to sequence use of RW as well as functional transfers.  Unsure if it is related to cognitive changes with subdural hematoma vs just not familiar with use of walker.  Will continue to assess in treatment.    Follow Up Recommendations  Inpatient Rehab       Equipment Recommendations  Defer to next venue    Recommendations for Other Services Rehab consult  Frequency Min 2X/week   Plan Discharge plan needs to be updated    Precautions / Restrictions Precautions Precautions: Fall Restrictions Weight Bearing Restrictions: No   Pertinent Vitals/Pain HR increased to 120 with activity,  O2 sats stable     ADL  Eating/Feeding: Simulated;Independent Where Assessed - Eating/Feeding: Edge of bed Grooming: Simulated;Minimal assistance Where Assessed - Grooming: Supported standing Upper Body Bathing: Simulated;Set up Where Assessed - Upper Body Bathing: Unsupported sitting Lower Body Bathing: Simulated;Minimal assistance Where Assessed - Lower Body Bathing: Unsupported sit to stand Upper Body Dressing: Simulated;Set up Where Assessed - Upper Body Dressing: Unsupported sitting Lower Body Dressing: Simulated;Minimal assistance Where Assessed - Lower Body Dressing: Unsupported sit to stand Toilet Transfer: Performed;Minimal assistance Toilet Transfer Method: Other (comment) (Ambulate with RW.) Toilet Transfer Equipment: Raised toilet seat with arms (or 3-in-1 over  toilet) Toileting - Clothing Manipulation and Hygiene: Minimal assistance Where Assessed - Toileting Clothing Manipulation and Hygiene: Sit to stand from 3-in-1 or toilet;Standing Transfers/Ambulation Related to ADLs: Pt able to ambulate with RW and overall min facilitation for safety and balance.  Needs mod instructional cues to utilize walker correctly. ADL Comments: Pt overall min assist for simulated selfcare tasks.  Needs mod instructional cueing for hand placement with sit to stand to avoild pulling up on walker.  Noted increased dizziness with sit to supine.  May be indicative of underlying vestibular issue.      OT Goals Acute Rehab OT Goals OT Goal Formulation: With patient Time For Goal Achievement: 04/22/12 Potential to Achieve Goals: Good ADL Goals Pt Will Perform Grooming: with modified independence;Standing at sink ADL Goal: Grooming - Progress: Goal set today Pt Will Perform Lower Body Bathing: with modified independence;Sit to stand from chair;Sit to stand from bed ADL Goal: Lower Body Bathing - Progress: Goal set today Pt Will Perform Lower Body Dressing: with modified independence;Sit to stand from bed;Sit to stand from chair ADL Goal: Lower Body Dressing - Progress: Goal set today Pt Will Transfer to Toilet: with modified independence;with DME;3-in-1 ADL Goal: Toilet Transfer - Progress: Goal set today Pt Will Perform Toileting - Clothing Manipulation: with modified independence;Sitting on 3-in-1 or toilet;Standing ADL Goal: Toileting - Clothing Manipulation - Progress: Goal set today Pt Will Perform Toileting - Hygiene: with modified independence;Sit to stand from 3-in-1/toilet ADL Goal: Toileting - Hygiene - Progress: Goal set today Additional ADL Goal #1: Pt will initiate and complete grooming, bathing, or dressing tasks with no more than one instructional cue for initiation.  Visit Information  Last OT Received On: 04/08/12 Assistance Needed: +1 PT/OT  Co-Evaluation/Treatment: Yes    Subjective Data  Subjective: "I have to  go to the bathroom do ya'll have to be with me?" Patient Stated Goal: to get back to normal   Prior Functioning  Home Living Lives With: Spouse Available Help at Discharge: Family Type of Home: Independent living facility (senior apartments) Bathroom Toilet: Handicapped height (grab bars both sides) Home Adaptive Equipment: Wheelchair - manual Prior Function Level of Independence: Needs assistance Needs Assistance: Bathing;Dressing;Toileting;Meal Prep;Light Housekeeping;Gait Bath: Moderate Dressing: Moderate Toileting: Minimal Meal Prep: Total Light Housekeeping: Total Gait Assistance: hand held assist by husband 24/7 for the last week or so Communication Communication: No difficulties Dominant Hand: Right    Cognition  Overall Cognitive Status: Impaired Area of Impairment: Safety/judgement Arousal/Alertness: Awake/alert Orientation Level: Oriented X4 / Intact Behavior During Session: WFL for tasks performed Safety/Judgement - Other Comments: Pt needs mod instructional cueing to utilize walker correctly and perform sit to stand safely. Cognition - Other Comments: Needed multiple cues for hand placement with sit to stand and stand to sit transitions.  Will continue to evaluate in function.    Mobility Bed Mobility Bed Mobility: Supine to Sit;Sitting - Scoot to Edge of Bed Supine to Sit: 4: Min assist Sitting - Scoot to Edge of Bed: 3: Mod assist Details for Bed Mobility Assistance: Mod instructional cues for hand placement and to initiate scooting forward to EOB. Transfers Transfers: Sit to Stand Sit to Stand: 4: Min assist;With upper extremity assist Stand to Sit: 4: Min assist;With upper extremity assist Details for Transfer Assistance: Mod instructional cueing for hand placement during sit to stand and stand to sit transitions.      Balance Balance Balance Assessed: Yes Static Standing  Balance Static Standing - Balance Support: Right upper extremity supported;Left upper extremity supported Static Standing - Level of Assistance: 5: Stand by assistance Dynamic Standing Balance Dynamic Standing - Balance Support: Right upper extremity supported;Left upper extremity supported Dynamic Standing - Level of Assistance: 4: Min assist  End of Session OT - End of Session Equipment Utilized During Treatment: Gait belt Activity Tolerance: Patient tolerated treatment well Patient left: in bed;with call bell/phone within reach;with bed alarm set;with family/visitor present Nurse Communication: Mobility status     Camani Sesay  OTR/L 04/08/2012, 4:57 PM Pager number [phone]

## 2012-04-08 NOTE — Clinical Social Work Psychosocial (Signed)
     Clinical Social Work Department BRIEF PSYCHOSOCIAL ASSESSMENT 04/08/2012  Patient:  [name], PIGMAN     Account Number:  [id number]     Admit date:  04/03/2012  Clinical Social Worker:  Margaree Mackintosh  Date/Time:  04/08/2012 02:16 PM  Referred by:  Physician  Date Referred:  04/07/2012 Referred for  SNF Placement   Other Referral:   Interview type:  Patient Other interview type:   with Son, Dtr-in-law, and granddtr present.    PSYCHOSOCIAL DATA Living Status:  FAMILY Admitted from facility:   Level of care:   Primary support name:  Genevie Cheshire: [phone] Primary support relationship to patient:  SPOUSE Degree of support available:   Adequate.    CURRENT CONCERNS Current Concerns  Post-Acute Placement   Other Concerns:    SOCIAL WORK ASSESSMENT / PLAN Clinical Social Worker recieved referral for potential SNF placement.  CSW reviewed chart and met with pt/family at bedside.  CSW introduced self, explained role, and provided support.  CSW reviewed PT/OT recommendations; pt and family both in strong agreement that pt would benefit from SNF. CSW reviewed SNF process and provided opportunity for pt/family to address questions/concerns.  CSW submitted pt's information to [facility] (Where family lives) and [place] (Where pt and spouse live).  CSW to continue to follow and assist as needed.   Assessment/plan status:  Psychosocial Support/Ongoing Assessment of Needs Other assessment/ plan:   Information/referral to community resources:   SNF.    PATIENTS/FAMILYS RESPONSE TO PLAN OF CARE: Pt and family were all engaged and pleasant.  Pt/family appeared to speak openly and honestly. Pt/famliy thanked CSW for intervention.

## 2012-04-08 NOTE — Progress Notes (Signed)
Physical Therapy Treatment Patient Details Name: [name] MRN: [id number] DOB: 07/26/1936 Today's Date: 04/08/2012 Time: 4098-1191 PT Time Calculation (min): 38 min  PT Assessment / Plan / Recommendation Comments on Treatment Session  Re-evaluation completed following fall at [facility] with SDH. Pt very similar functional level as prior to fall. Pt  required verbal cues for safety throughout treatment  as well as continues cues for proper sequencing. Discussed with family the need for ST-Rehab upon d/c for safety. Pt would make an excellent rehab candidate. Also, recommending vestibular evaluation as pt describes symptoms of vertigo with positional changes.     Follow Up Recommendations  Inpatient Rehab    Barriers to Discharge        Equipment Recommendations  Defer to next venue    Recommendations for Other Services Rehab consult  Frequency Min 3X/week   Plan Discharge plan needs to be updated;Frequency remains appropriate    Precautions / Restrictions Precautions Precautions: Fall Restrictions Weight Bearing Restrictions: No       Mobility  Bed Mobility Bed Mobility: Supine to Sit;Sitting - Scoot to Edge of Bed Supine to Sit: 4: Min assist Sitting - Scoot to Edge of Bed: 3: Mod assist Sit to Supine: 3: Mod assist Details for Bed Mobility Assistance: Mod instructional cues for hand placement and to initiate scooting forward to EOB. Transfers Transfers: Sit to Stand;Stand to Sit Sit to Stand: 4: Min assist;With upper extremity assist Stand to Sit: 4: Min assist;With upper extremity assist Details for Transfer Assistance: Mod instructional cueing for hand placement during sit to stand and stand to sit transitions. Ambulation/Gait Ambulation/Gait Assistance: 4: Min assist Ambulation Distance (Feet): 40 Feet Assistive device: Rolling walker Ambulation/Gait Assistance Details: VC for proper sequencing and safety with RW. Distance limited due to pt fatigue and  complaints of LE's feelign weak with ambulation.  Gait Pattern: Step-through pattern;Decreased stride length;Shuffle;Narrow base of support Gait velocity: decreased      PT Goals Acute Rehab PT Goals PT Goal Formulation: With patient Time For Goal Achievement: 04/15/12 Potential to Achieve Goals: Good Pt will go Supine/Side to Sit: with modified independence PT Goal: Supine/Side to Sit - Progress: Goal set today Pt will go Sit to Stand: with supervision PT Goal: Sit to Stand - Progress: Goal set today Pt will go Stand to Sit: with supervision PT Goal: Stand to Sit - Progress: Goal set today Pt will Ambulate: >150 feet;with supervision;with least restrictive assistive device PT Goal: Ambulate - Progress: Goal set today  Visit Information  Last PT Received On: 04/08/12 Assistance Needed: +1 PT/OT Co-Evaluation/Treatment: Yes    Subjective Data      Cognition  Overall Cognitive Status: Impaired Area of Impairment: Safety/judgement Arousal/Alertness: Awake/alert Orientation Level: Oriented X4 / Intact Behavior During Session: [facility] for tasks performed Safety/Judgement - Other Comments: Pt needs mod instructional cueing to utilize walker correctly and perform sit to stand safely. Cognition - Other Comments: Needed multiple cues for hand placement with sit to stand and stand to sit transitions.  Will continue to evaluate in function.    Balance  Balance Balance Assessed: Yes Static Standing Balance Static Standing - Balance Support: Right upper extremity supported;Left upper extremity supported Static Standing - Level of Assistance: 5: Stand by assistance Dynamic Standing Balance Dynamic Standing - Balance Support: Right upper extremity supported;Left upper extremity supported Dynamic Standing - Level of Assistance: 4: Min assist  End of Session PT - End of Session Equipment Utilized During Treatment: Gait belt Activity Tolerance: Patient  limited by fatigue Patient left: in  bed;with call bell/phone within reach;with family/visitor present Nurse Communication: Mobility status     Milana Kidney 04/08/2012, 5:33 PM  04/08/2012 Milana Kidney DPT PAGER: [phone] OFFICE: [phone]

## 2012-04-08 NOTE — Clinical Social Work Placement (Signed)
     Clinical Social Work Department CLINICAL SOCIAL WORK PLACEMENT NOTE 04/08/2012  Patient:  [name], [name]  Account Number:  [id number] Admit date:  04/03/2012  Clinical Social Worker:  Margaree Mackintosh  Date/time:  04/08/2012 02:20 PM  Clinical Social Work is seeking post-discharge placement for this patient at the following level of care:   SKILLED NURSING   (*CSW will update this form in Epic as items are completed)   04/08/2012  Patient/family provided with [facility] Department of Clinical Social Works list of facilities offering this level of care within the geographic area requested by the patient (or if unable, by the patients family).  04/08/2012  Patient/family informed of their freedom to choose among providers that offer the needed level of care, that participate in Medicare, Medicaid or managed care program needed by the patient, have an available bed and are willing to accept the patient.  04/08/2012  Patient/family informed of [facility] ownership interest in [facility], as well as of the fact that they are under no obligation to receive care at this facility.  PASARR submitted to [employer] on 04/08/2012 PASARR number received from [employer] on   FL2 transmitted to all facilities in geographic area requested by pt/family on  04/08/2012 FL2 transmitted to all facilities within larger geographic area on   Patient informed that his/her managed care company has contracts with or will negotiate with  certain facilities, including the following:     Patient/family informed of bed offers received:   Patient chooses bed at  Physician recommends and patient chooses bed at    Patient to be transferred to  on   Patient to be transferred to facility by   The following physician request were entered in Epic:   Additional Comments:

## 2012-04-08 NOTE — Procedures (Signed)
CT guided bone marrow aspirates and core biopsies.  No immediate complication. 

## 2012-04-08 NOTE — Progress Notes (Signed)
Pt to x-ray for bone marrow biopsy. No c/o husband and son in room.

## 2012-04-08 NOTE — Progress Notes (Signed)
Transferred to room 7 via bed. Alert, oriented x2, ambulated to BR with assist. Family at bedside.

## 2012-04-09 LAB — CBC
HCT: 27.9 % — ABNORMAL LOW (ref 36.0–46.0)
Hemoglobin: 9.7 g/dL — ABNORMAL LOW (ref 12.0–15.0)
MCH: 31.2 pg (ref 26.0–34.0)
MCHC: 34.8 g/dL (ref 30.0–36.0)

## 2012-04-09 LAB — BASIC METABOLIC PANEL
BUN: 16 mg/dL (ref 6–23)
Calcium: 10.7 mg/dL — ABNORMAL HIGH (ref 8.4–10.5)
Creatinine, Ser: 1.04 mg/dL (ref 0.50–1.10)
GFR calc non Af Amer: 52 mL/min — ABNORMAL LOW (ref >90)
Glucose, Bld: 98 mg/dL (ref 70–99)

## 2012-04-09 MED ORDER — FLEET ENEMA 7-19 GM/118ML RE ENEM
1.0000 | ENEMA | Freq: Once | RECTAL | Status: AC
  Administered 2012-04-09: 1 via RECTAL
  Filled 2012-04-09: qty 1

## 2012-04-09 MED ORDER — BACITRACIN-NEOMYCIN-POLYMYXIN 400-5-5000 EX OINT
TOPICAL_OINTMENT | Freq: Two times a day (BID) | CUTANEOUS | Status: DC
  Administered 2012-04-09 – 2012-04-10 (×2): via TOPICAL
  Filled 2012-04-09 (×3): qty 1

## 2012-04-09 NOTE — Progress Notes (Addendum)
[facility]   PCP: Alden Hipp, M.D.  CODE STATUS: Full  Family communication: Son updated on transition of care from pulmonary critical care medicine to [facility]. In addition he was updated on the initiation of discharge planning including PT and OT evaluation in the process for which patient will be evaluated for skilled nursing facility versus rehabilitation facility. He was also updated on likelihood that biopsy results will not be available until Monday of next week at the earliest.  Consultants:  Dr. Kritzer/Neurosurgery Dr. Henn/Interventional Radiology Dr. Lucky Rathke Dr. Ha/Hematology  Interim history: 76yo female with hx anxiety, HTN presented 7/8 with 3 month hx back pain, extremity weakness and urinary incontinence. She was admitted to [facility] by [facility] and workup revealed acute renal failure, hypercalcemia, proteinuria and MRI L spine showed innumerable lesions.  Heme/Onc was consulted for workup for multiple myeloma. Pt was improving and unfortunately sustained an unwitnessed fall 7/10 with subsequent subdural hematoma. She was tx to [facility] for Neurosurgery evaluation and PCCM asked to assume care. She remained neurologically stable and did not require any surgical intervention and was otherwise deemed appropriate to transfer out of ICU to the step down unit. [facility] assumed care of the patient on 04/08/2012.  Subjective:  In addition she is also focused on the fact she has not had a bowel movement since admission. Apparently constipation was a problem prior to admission and she used a Dulcolax suppository as well as milk of magnesia at least once weekly to ensure she had a bowel movement.  No other complaints were verbalized.  Objective: Blood pressure 142/80, pulse 88, temperature 98 F (36.7 C), temperature source Oral, resp. rate 18, height 5\' 7"  (1.702 m), weight 79.6 kg (175 lb 7.8 oz), SpO2 93.00%.  Intake/Output from previous  day: 07/12 0701 - 07/13 0700 In: 1425.8 [P.O.:50; I.V.:1375.8] Out: 775 [Urine:775] Intake/Output this shift: Total I/O In: 1517.5 [P.O.:960; I.V.:557.5] Out: 600 [Urine:600]  General appearance: alert, cooperative, appears stated age and no distress Resp: clear to auscultation bilaterally, on room air with O2 saturations 96-98% Cardio: regular rate and sinus rhythm, S1, S2 normal, no murmur, click, rub or gallop GI: soft, non-tender; bowel sounds normal; no masses,  no organomegaly Extremities: extremities normal, atraumatic, no cyanosis or edema Neurologic: Grossly normal  Lab Results:  Basename 04/09/12 0655 04/08/12 0420  WBC 9.5 10.0  HGB 9.7* 10.9*  HCT 27.9* 31.1*  PLT 174 167   BMET  Basename 04/09/12 0655 04/08/12 0420  NA 136 140  K 3.6 3.5  CL 99 100  CO2 20 21  GLUCOSE 98 101*  BUN 16 17  CREATININE 1.04 1.12*  CALCIUM 10.7* 11.2*    Studies/Results: Ct Biopsy  04/08/2012  *RADIOLOGY REPORT*  Clinical history: 76 year old with findings suspicious for myeloma.   PROCEDURE(S): CT GUIDED BONE MARROW ASPIRATES AND BIOPSY  Physician: Rachelle Hora. Lowella Dandy, MD   Medications: Versed 1.5 mg, Fentanyl 25 mcg. A radiology nurse monitored the patient for moderate sedation.  Sedation time:  18 minutes   Procedure: The procedure was explained to the patient. The risks and benefits of the procedure were discussed and the patient's questions were addressed.  Informed consent was obtained from the patient. The patient was placed prone on CT scan. Images of the pelvis were obtained. The right side of back was prepped and draped in sterile fashion. The skin and right posterior iliac bone were anesthetized with 1% lidocaine.   11 gauge bone needle was directed into the right  iliac bone with CT guidance. Two aspirates and two core biopsy obtained.   Findings: Needle placement within the right posterior iliac bone.  Complications: None   Impression: CT guided bone marrow aspirates and core  biopsy.  Original Report Authenticated By: Richarda Overlie, M.D.   Medications: I have reviewed the patient's current medications.  Assessment/Plan:  Acute subdural hematoma after fall in hospital *Neurosurgery has been following and no indications for surgery - f/u CT head reveals decrease in size of hematoma *No neurological deficit clinically  Acute renal failure *Likely related to multiple myeloma *Creatinine has trended downward to 1.12 after a peak of 2.45  ? of Multiple myeloma *Patient underwent a bone marrow biopsy in interventional radiology 04/08/2012 *Dr. Gaylyn Rong with Hematology is following *Continue prednisone which is also being utilized to decrease cerebral edema post subdural hematoma *Large soft tissue mass destroying the lateral aspect of the seventh rib on the right most likely due to multiple myeloma  Dehydration Resolved w/ IVF resuscitation - NSL IVF and follow  Hypercalcemia *Likely secondary to unproven diagnosis of multiple myeloma *Calcium stable at 11.2 after hydration *She has been cleared by the dental service to receive pamidronate but Dr. Gaylyn Rong recommends holding off on this until she is more stable from a neurological standpoint in the event she has an infusion reaction from this medication  Hip pain/Lytic lesion of bone on x-ray *Likely due to multiple myeloma *Treat symptoms *Mobilize with the assistance of PT  HYPERTENSION *Blood pressure controlled *This is off medication  Anemia *Hemoglobin stable in the 10 range  Constipation due to slow transit/hypercalcemia *Chronic issue prior to admission and likely an indicator of decreased transit related to hypercalcemia influenced smooth muscle dysmotility *Patient had apparent excellent results with utilization of milk of magnesia and Dulcolax suppository at home so we'll reorder these *Continue twice a day Colace and add at hour of sleep MiraLax *TSH is normal at 1.662  Disposition Awaiting inpatient  rehab evaluation.    LOS: 6 days   [facility]  [facility]  Pager [phone]  If 7PM-7AM, please contact night-coverage  [email or web address]  Password [email or web address]

## 2012-04-10 MED ORDER — BACITRACIN-NEOMYCIN-POLYMYXIN OINTMENT TUBE
TOPICAL_OINTMENT | Freq: Two times a day (BID) | CUTANEOUS | Status: DC
  Administered 2012-04-10 – 2012-04-12 (×4): via TOPICAL

## 2012-04-10 NOTE — Progress Notes (Signed)
[facility]   PCP: Alden Hipp, M.D.  CODE STATUS: Full  Family communication: Son updated on transition of care from pulmonary critical care medicine to [facility]. In addition he was updated on the initiation of discharge planning including PT and OT evaluation in the process for which patient will be evaluated for skilled nursing facility versus rehabilitation facility. He was also updated on likelihood that biopsy results will not be available until Monday of next week at the earliest.  Consultants:  Dr. Kritzer/Neurosurgery Dr. Henn/Interventional Radiology Dr. Lucky Rathke Dr. Ha/Hematology  Interim history: 76yo female with hx anxiety, HTN presented 7/8 with 3 month hx back pain, extremity weakness and urinary incontinence. She was admitted to [facility] by [facility] and workup revealed acute renal failure, hypercalcemia, proteinuria and MRI L spine showed innumerable lesions.  Heme/Onc was consulted for workup for multiple myeloma. Pt was improving and unfortunately sustained an unwitnessed fall 7/10 with subsequent subdural hematoma. She was tx to [facility] for Neurosurgery evaluation and PCCM asked to assume care. She remained neurologically stable and did not require any surgical intervention and was otherwise deemed appropriate to transfer out of ICU to the step down unit. [facility] assumed care of the patient on 04/08/2012.  Subjective:  In addition she is also focused on the fact she has not had a bowel movement since admission. Apparently constipation was a problem prior to admission and she used a Dulcolax suppository as well as milk of magnesia at least once weekly to ensure she had a bowel movement.  No other complaints were verbalized.  Objective: Blood pressure 135/75, pulse 88, temperature 98 F (36.7 C), temperature source Oral, resp. rate 17, height 5\' 7"  (1.702 m), weight 79.6 kg (175 lb 7.8 oz), SpO2 98.00%.  Intake/Output from previous  day: 07/13 0701 - 07/14 0700 In: 1652.5 [P.O.:960; I.V.:692.5] Out: 775 [Urine:775] Intake/Output this shift: Total I/O In: 600 [P.O.:600] Out: 200 [Urine:200]  General appearance: alert, cooperative, appears stated age and no distress Resp: clear to auscultation bilaterally, on room air with O2 saturations 96-98% Cardio: regular rate and sinus rhythm, S1, S2 normal, no murmur, click, rub or gallop GI: soft, non-tender; bowel sounds normal; no masses,  no organomegaly Extremities: extremities normal, atraumatic, no cyanosis or edema Neurologic: Grossly normal  Lab Results:  Basename 04/09/12 0655 04/08/12 0420  WBC 9.5 10.0  HGB 9.7* 10.9*  HCT 27.9* 31.1*  PLT 174 167   BMET  Basename 04/09/12 0655 04/08/12 0420  NA 136 140  K 3.6 3.5  CL 99 100  CO2 20 21  GLUCOSE 98 101*  BUN 16 17  CREATININE 1.04 1.12*  CALCIUM 10.7* 11.2*    Studies/Results: No results found. Medications: I have reviewed the patient's current medications.  Assessment/Plan:  Acute subdural hematoma after fall in hospital *Neurosurgery has been following and no indications for surgery - f/u CT head reveals decrease in size of hematoma *No neurological deficit clinically  Acute renal failure *Likely related to multiple myeloma *Creatinine has trended downward to 1.12 after a peak of 2.45  ? of Multiple myeloma *Patient underwent a bone marrow biopsy in interventional radiology 04/08/2012 *Dr. Gaylyn Rong with Hematology is following *Continue prednisone which is also being utilized to decrease cerebral edema post subdural hematoma *Large soft tissue mass destroying the lateral aspect of the seventh rib on the right most likely due to multiple myeloma  Dehydration Resolved w/ IVF resuscitation - NSL IVF and follow  Hypercalcemia *Likely secondary to unproven diagnosis of  multiple myeloma *Calcium stable at 10.7 after hydration *She has been cleared by the dental service to receive pamidronate  but Dr. Gaylyn Rong recommends holding off on this until she is more stable from a neurological standpoint in the event she has an infusion reaction from this medication  Hip pain/Lytic lesion of bone on x-ray *Likely due to multiple myeloma *Mobilize with the assistance of PT  HYPERTENSION *Blood pressure controlled   Anemia *Hemoglobin stable between 9 to10 range  Constipation due to slow transit/hypercalcemia: Resolved.  *Chronic issue prior to admission and likely an indicator of decreased transit related to hypercalcemia influenced smooth muscle dysmotility *Patient had apparent excellent results with utilization of milk of magnesia and Dulcolax suppository at home so we'll reorder these *Continue twice a day Colace and add at hour of sleep MiraLax *TSH is normal at 1.662  Disposition Awaiting inpatient rehab evaluation.    LOS: 7 days   [facility]  [facility]  Pager [phone]  If 7PM-7AM, please contact night-coverage  [email or web address]  Password [email or web address]

## 2012-04-11 ENCOUNTER — Encounter (HOSPITAL_COMMUNITY): Payer: Self-pay | Admitting: Oncology

## 2012-04-11 DIAGNOSIS — C9000 Multiple myeloma not having achieved remission: Secondary | ICD-10-CM

## 2012-04-11 DIAGNOSIS — S069X9A Unspecified intracranial injury with loss of consciousness of unspecified duration, initial encounter: Secondary | ICD-10-CM

## 2012-04-11 DIAGNOSIS — W19XXXA Unspecified fall, initial encounter: Secondary | ICD-10-CM

## 2012-04-11 DIAGNOSIS — S069XAA Unspecified intracranial injury with loss of consciousness status unknown, initial encounter: Secondary | ICD-10-CM

## 2012-04-11 MED ORDER — SODIUM CHLORIDE 0.9 % IV SOLN
60.0000 mg | Freq: Once | INTRAVENOUS | Status: AC
  Administered 2012-04-12: 60 mg via INTRAVENOUS
  Filled 2012-04-11: qty 500

## 2012-04-11 MED ORDER — DEXAMETHASONE 6 MG PO TABS
40.0000 mg | ORAL_TABLET | ORAL | Status: DC
  Administered 2012-04-12: 40 mg via ORAL
  Filled 2012-04-11: qty 1

## 2012-04-11 MED ORDER — ACYCLOVIR 400 MG PO TABS: 400.0000 mg | ORAL_TABLET | Freq: Every day | ORAL | Status: DC

## 2012-04-11 MED ORDER — PROCHLORPERAZINE MALEATE 10 MG PO TABS: 10.0000 mg | ORAL_TABLET | Freq: Four times a day (QID) | ORAL | Status: DC | PRN

## 2012-04-11 MED ORDER — ONDANSETRON HCL 8 MG PO TABS: ORAL_TABLET | ORAL | Status: DC

## 2012-04-11 MED ORDER — MENTHOL 3 MG MT LOZG: 1.0000 | LOZENGE | OROMUCOSAL | Status: DC | PRN

## 2012-04-11 MED ORDER — ACYCLOVIR 200 MG PO CAPS
400.0000 mg | ORAL_CAPSULE | Freq: Every day | ORAL | Status: DC
  Administered 2012-04-12: 400 mg via ORAL
  Filled 2012-04-11: qty 2

## 2012-04-11 MED ORDER — TETRAHYDROZOLINE HCL 0.05 % OP SOLN
1.0000 [drp] | Freq: Two times a day (BID) | OPHTHALMIC | Status: DC
  Administered 2012-04-11 – 2012-04-12 (×3): 1 [drp] via OPHTHALMIC
  Filled 2012-04-11 (×2): qty 15

## 2012-04-11 NOTE — Progress Notes (Signed)
Occupational Therapy Treatment Patient Details Name: [name] MRN: [id number] DOB: [birth year] Today's Date: 04/11/2012 Time: 4098-1191 OT Time Calculation (min): 18 min  OT Assessment / Plan / Recommendation Comments on Treatment Session Pt continues to require max multimodal cues for safe manipulation of RW but is progressing well overall. Feel inpt rehab would be best d/c option for more extensive OT.    Follow Up Recommendations  Inpatient Rehab    Barriers to Discharge       Equipment Recommendations  Defer to next venue    Recommendations for Other Services    Frequency Min 2X/week   Plan Discharge plan remains appropriate    Precautions / Restrictions Precautions Precautions: Fall   Pertinent Vitals/Pain     ADL  Grooming: Performed;Brushing hair Where Assessed - Grooming: Supported standing Lower Body Dressing: Performed;Set up (don/doff socks.) Where Assessed - Lower Body Dressing: Unsupported sitting Toilet Transfer: Simulated;Min guard Toilet Transfer Method: Sit to stand (ambulated with RW to recliner.) Transfers/Ambulation Related to ADLs: Pt ambulated to the bathroom with minguard A and max directional & safety cueing. Pt has tendency to abandon RW before reaching destination. ADL Comments: Pt reported no dizziness this session. States she only becomes dizzy when sitting up from a supine position. Upon arrival, pt in bed elevated to ~60 degrees.    OT Diagnosis:    OT Problem List:   OT Treatment Interventions:     OT Goals ADL Goals ADL Goal: Grooming - Progress: Progressing toward goals ADL Goal: Lower Body Dressing - Progress: Progressing toward goals ADL Goal: Toilet Transfer - Progress: Progressing toward goals ADL Goal: Additional Goal #1 - Progress: Progressing toward goals  Visit Information  Last OT Received On: 04/11/12 Assistance Needed: +1    Subjective Data  Subjective: I hit my head trying to get up at [facility].   Prior Functioning     Cognition  Overall Cognitive Status: Impaired Area of Impairment: Awareness of errors;Safety/judgement;Problem solving Arousal/Alertness: Awake/alert Orientation Level: Appears intact for tasks assessed Behavior During Session: [facility] for tasks performed Safety/Judgement: Decreased awareness of safety precautions;Decreased safety judgement for tasks assessed;Impulsive;Decreased awareness of need for assistance Awareness of Errors: Assistance required to identify errors made;Assistance required to correct errors made Cognition - Other Comments: Pt needed to be re-directed several times during session, highly distractable. Cues needed to focus on task at hand.    Mobility Bed Mobility Supine to Sit: 4: Min guard;HOB elevated;With rails Sitting - Scoot to Edge of Bed: 4: Min guard Details for Bed Mobility Assistance: Min amt of VCs to sequence task, hand placement. Transfers Sit to Stand: 4: Min guard;With upper extremity assist;From bed Stand to Sit: 4: Min guard;With upper extremity assist;To chair/3-in-1;With armrests Details for Transfer Assistance: Max VCs need for hand placement and safe manipulation of RW.   Exercises    Balance Static Standing Balance Static Standing - Balance Support: No upper extremity supported;During functional activity Static Standing - Level of Assistance: 5: Stand by assistance  End of Session OT - End of Session Activity Tolerance: Patient tolerated treatment well Patient left: in chair;with call bell/phone within reach;with family/visitor present  GO     Makyiah Lie A OTR/L [phone] 04/11/2012, 10:42 AM

## 2012-04-11 NOTE — Consult Note (Signed)
[facility] INPATIENT PROGRESS NOTE  Name: [name]      MRN: [id number]    Location: 6N07C/6N07C-01  Date: 04/11/2012 Time:6:59 PM   Subjective: Interval History:Roselynn C Szuch reported feeling better than last week.  Her confusion has completely resolved.  She denied headache, diplopia, blurry vision.  Her bilateral hip pain has significantly improved.  She was able to ambulate with PT for about 5-10 minutes the last two days.   She denied fever, mucositis, SOB, chest pain, abd pain, bleeding symptoms, skin rash, bowel/bladder incontinence, lower extremity paresthesia.   Objective: Vital signs in last 24 hours: Temp:  [97.9 F (36.6 C)-98.3 F (36.8 C)] 98 F (36.7 C) (07/15 1432) Pulse Rate:  [80-89] 89  (07/15 1432) Resp:  [18] 18  (07/15 1432) BP: (122-138)/(75-78) 122/78 mmHg (07/15 1432) SpO2:  [97 %] 97 % (07/15 1432)    Intake/Output from previous day: 07/14 0701 - 07/15 0700 In: 2547.5 [P.O.:840; I.V.:1707.5] Out: 200 [Urine:200]     PHYSICAL EXAM: Gen: Well-nourished, in no acute distress. Eyes: No scleral icterus or jaundice. ENT: There was no oropharyngeal lesions. Neck was supple without thyromegaly. Lymphatics: Negative for cervical, supraclavicular, axillary, or inguinal adenopathy. Respiratory: Lungs were clear bilaterally without wheezing or crackles. Cardiovascular: normal heart rate and rhythm; S1/S2; without murmur, rubs, or gallop. There was no pedal edema. GI: Abdomen was soft, flat, nontender, nondistended, without organomegaly. Musculoskeletal exam: No spinal tenderness on palpation of vertebral spine. Skin exam was without ecchymosis, petechiae. Neuro exam was nonfocal. CN II-XII grossly intact.     Studies/Results: WBC 9.5; Hgb 9.7; Plt 174 Cr 1.04; Ca 10.7.   MEDICATIONS: reviewed.    PROBLEM LISTS:  1. Hypertension  2. Hyperlipidemia.  3. Acute renal failure due to hypercalcemia and suspected multiple myeloma: resolved with IV  fluid 4. Hypercalcemia: Due to suspected multiple myeloma: resolved with IVF.  5. Moderate deconditioning due to bone pain:  On PT and eval for need for rehab placement (either inpt or SNF or home PT) 6. Lytic bone lesions causing bone pain: From suspected multiple myeloma: under control with pain med.  7. Acute subdural hematoma from recent fall:  Resolving per follow up head CT.   IMPRESSIONS:  Her bone marrow biopsy came back this afternoon diagnostic of plasma cytoma (with 92% plasma cell on aspirate); confirming the clinical diagnosis of multiple myeloma.  Cytogenetics is still pending (out in about 3 weeks) for risk stratification.  I had a long discussion with patient, her husband, son, and grand son that myeloma has average response rate of up to 80%.  Patients with mutations on cytogenetics such as 13 or 17 deletions have much lower rate.  Chemotherapy is indicated in her case given all end organ manifestations of the disease (anemia, renal insufficiency, hypercalcemia, and lytic bone lesions).   Treatment for myeloma entails induction chemotherapy followed by potential autologous bone marrow transplant (BMT); followed by maintenance chemo. Given her age of >64 and borderline performance status, she is not an optimal candidate for autologous BMT at this time.  Therefore, we will readdress her suitability for BMT after induction chemo and rehab.  However, patients normally do well with chemotherapy in myeloma since the side effects profile is much less than traditional chemo.  Given her tumor burden of 92% plasma cell, I recommend combination chemotherapy with Velcade 1.3mg /m2 SQ, weekly, 3 weeks on, 1 week off; Revlimid 15mg  PO daily (21 days on, 7 days off); Dexamethasone 40mg  PO once  a week.  I attenuated the dose of Revlimid due to concern for her age, performance status, and high risk of cytopenia due to high disease burden.    This chemo has potential response rate of up to 80%.  We will  follow her Mspike and light chain monthly to ascertain response.  Side effects of Velcade/Revlimid/Dexamethasone include but not limited to alopecia, fatigue, GERD, insomnia, mucositis, cytopenia, bleeding, infection, thrombosis, skin rash, neuropathy, secondary cancer, electrolyte abnormality, constipation, diarrhea, birth defects.    Mrs. Milillo and her family expressed informed understanding and wished to proceed with chemo as stated.    RECCOMMENDATIONS:  - I wrote for Velcade 1.3mg /m2 SQ once a week (3 wks on, 1 wk off).   Chemo will be administered by Chemo-certified IV team.  Patient does not need to be isolated or any precaution needed for this light chemo.  I started patient on Acyclovir 400 mg PO daily to decrease risk of viral infection on Velcade chemo.  - I will arrange for her to get Revlimid chemo pill at a later date.  Revlimid is not available readily.  This will require enrollment into [facility] program.  So, this cycle, her Revlimid will be delayed.  - I started her on Dexamethasone 40mg  PO once a week (to start the same day as Velcade chemo).   - I wrote for pamidronate 60mg  IV x once (normally once a month med) to decrease skeletal related events from myeloma.  She was cleared by Dental.  I discontinued Calcitonin.  - I recommend to primary team to taper off of Prednisone 20mg  PO daily that was started by Neuro Surg for subdural hematoma as appropriate (as she is on Dexamethasone already).  - I agree with PT eval for rehab either inpt, SNF, or home outpt PT/OT.    The length of time of the face-to-face encounter was 45 minutes. More than 50% of time was spent counseling and coordination of care.

## 2012-04-11 NOTE — Progress Notes (Signed)
Clinical Social Work  CSW received weekend handoff that reported patient received bed offers but was interested in [facility]. CSW reviewed chart which stated [facility] is evaluating patient as well. CSW met with patient, brother-in-law, sister and son at bedside. CSW received permission to speak openly in front of family. CSW explained that [facility] was unable to offer a bed. Patient reports that [facility] is first option and they are hopeful for that as dc plan. CSW encouraged patient to review SNF and have alternative option in case [facility] is not able to offer a bed. CSW will continue to follow.  [place], [place] [phone]

## 2012-04-11 NOTE — Progress Notes (Signed)
Pt and family member refuse help to bathroom.  Female family member helps her to the bathroom and chair.  Gait steady with 1 person assist.  Bed alarm placed on after patient was back in bed.

## 2012-04-11 NOTE — Progress Notes (Signed)
Physical Therapy Treatment Patient Details Name: [name] MRN: [id number] DOB: 06-Jul-1936 Today's Date: 04/11/2012 Time: 4098-1191 PT Time Calculation (min): 26 min  PT Assessment / Plan / Recommendation Comments on Treatment Session  Continues to progress.     Follow Up Recommendations  Inpatient Rehab    Barriers to Discharge        Equipment Recommendations  Defer to next venue    Recommendations for Other Services    Frequency Min 3X/week   Plan Discharge plan remains appropriate;Frequency remains appropriate    Precautions / Restrictions Precautions Precautions: Fall       Mobility  Bed Mobility Bed Mobility: Rolling Right;Right Sidelying to Sit Rolling Right: 5: Supervision Right Sidelying to Sit: 4: Min assist;HOB flat Details for Bed Mobility Assistance: sequencing cues and facilitation to bring trunk upright; had pt roll to r/o vestibular component, no c/o dizziness and no nystagmus noted Transfers Transfers: Sit to Stand;Stand to Sit Sit to Stand: 4: Min guard;From bed Stand to Sit: To toilet;4: Min guard;With upper extremity assist Details for Transfer Assistance: cues for safe manipulation of RW, safe hand placement and utilization of grab bars in bathroom as opposed to pulling on RW for transfers Ambulation/Gait Ambulation/Gait Assistance: 4: Min assist Ambulation Distance (Feet): 50 Feet Assistive device: Rolling walker Ambulation/Gait Assistance Details: slow shuffled gait, distracted and tends to stagger when distracted; cues for safe maniupulation of RW, did abandon RW x1 after washing hands at sink needing cue to bring with her; cues for safe use of RW especially with turns Gait Pattern: Trunk flexed;Shuffle    Exercises      PT Goals Acute Rehab PT Goals PT Goal: Supine/Side to Sit - Progress: Progressing toward goal PT Goal: Sit to Stand - Progress: Progressing toward goal PT Goal: Stand to Sit - Progress: Progressing toward goal PT Goal:  Ambulate - Progress: Progressing toward goal  Visit Information  Last PT Received On: 04/11/12 Assistance Needed: +1    Subjective Data  Subjective: They just gave me pain medicine.    Cognition  Overall Cognitive Status: Impaired Area of Impairment: Attention;Safety/judgement;Awareness of errors Arousal/Alertness: Awake/alert Orientation Level: Appears intact for tasks assessed Behavior During Session: [facility] for tasks performed Current Attention Level: Selective Safety/Judgement: Decreased awareness of safety precautions;Decreased safety judgement for tasks assessed;Impulsive;Decreased awareness of need for assistance Awareness of Errors: Assistance required to identify errors made Cognition - Other Comments: redirection back to task, highly distractable, abandoned the RW a few times    Balance  Static Standing Balance Static Standing - Balance Support: No upper extremity supported Static Standing - Level of Assistance: 5: Stand by assistance Dynamic Standing Balance Dynamic Standing - Balance Support: Right upper extremity supported Dynamic Standing - Level of Assistance: 3: Mod assist Dynamic Standing - Comments: facilitation for stability during AP and lateral weight shifting  End of Session PT - End of Session Equipment Utilized During Treatment: Gait belt Activity Tolerance: Patient tolerated treatment well Patient left: in chair;with call bell/phone within reach;with family/visitor present   GP     [facility] HELEN 04/11/2012, 4:19 PM

## 2012-04-11 NOTE — Progress Notes (Addendum)
[facility]   PCP: Alden Hipp, M.D.  CODE STATUS: Full  Family communication: Son updated on transition of care from pulmonary critical care medicine to [facility]. In addition he was updated on the initiation of discharge planning including PT and OT evaluation in the process for which patient will be evaluated for skilled nursing facility versus rehabilitation facility. He was also updated on likelihood that biopsy results will not be available until Monday of next week at the earliest.  Consultants:  Dr. Kritzer/Neurosurgery Dr. Henn/Interventional Radiology Dr. Lucky Rathke Dr. Ha/Hematology  Interim history: 76yo female with hx anxiety, HTN presented 7/8 with 3 month hx back pain, extremity weakness and urinary incontinence. She was admitted to [facility] by [facility] and workup revealed acute renal failure, hypercalcemia, proteinuria and MRI L spine showed innumerable lesions.  Heme/Onc was consulted for workup for multiple myeloma. Pt was improving and unfortunately sustained an unwitnessed fall 7/10 with subsequent subdural hematoma. She was tx to [facility] for Neurosurgery evaluation and PCCM asked to assume care. She remained neurologically stable and did not require any surgical intervention and was otherwise deemed appropriate to transfer out of ICU to the step down unit. [facility] assumed care of the patient on 04/08/2012.  Subjective: Comfortable, no new complaints.   Objective: Blood pressure 122/78, pulse 89, temperature 98 F (36.7 C), temperature source Oral, resp. rate 18, height 5\' 7"  (1.702 m), weight 79.6 kg (175 lb 7.8 oz), SpO2 97.00%.  Intake/Output from previous day: 07/14 0701 - 07/15 0700 In: 2547.5 [P.O.:840; I.V.:1707.5] Out: 200 [Urine:200] Intake/Output this shift: Total I/O In: 480 [P.O.:480] Out: -   General appearance: alert, cooperative, appears stated age and no distress Resp: clear to auscultation bilaterally, on  room air with O2 saturations 96-98% Cardio: regular rate and sinus rhythm, S1, S2 normal, no murmur, click, rub or gallop GI: soft, non-tender; bowel sounds normal; no masses,  no organomegaly Extremities: extremities normal, atraumatic, no cyanosis or edema Neurologic: Grossly normal  Lab Results:  [facility] 04/09/12 0655  WBC 9.5  HGB 9.7*  HCT 27.9*  PLT 174   BMET  Basename 04/09/12 0655  NA 136  K 3.6  CL 99  CO2 20  GLUCOSE 98  BUN 16  CREATININE 1.04  CALCIUM 10.7*    Studies/Results: No results found. Medications: I have reviewed the patient's current medications.  Assessment/Plan:  Acute subdural hematoma after fall in hospital *Neurosurgery has been following and no indications for surgery - f/u CT head reveals decrease in size of hematoma *No neurological deficit clinically  Acute renal failure *Likely related to multiple myeloma *Creatinine has trended downward to 1 after a peak of 2.45  ? of Multiple myeloma *Patient underwent a bone marrow biopsy in interventional radiology 04/08/2012 *Dr. Gaylyn Rong with Hematology is following *Continue prednisone which is also being utilized to decrease cerebral edema, s/p subdural hematoma *Large soft tissue mass destroying the lateral aspect of the seventh rib on the right most likely due to multiple myeloma  Dehydration Resolved w/ IVF resuscitation - NSL IVF and follow  Hypercalcemia *Likely secondary to unproven diagnosis of multiple myeloma *Calcium stable at 10.7 after hydration *She has been cleared by the dental service to receive pamidronate but Dr. Gaylyn Rong recommends holding off on this until she is more stable from a neurological standpoint in the event she has an infusion reaction from this medication  Hip pain/Lytic lesion of bone on x-ray *Likely due to multiple myeloma *Mobilize with the assistance of PT  HYPERTENSION *Blood pressure controlled   Anemia *Hemoglobin stable between 9 to10  range  Constipation due to slow transit/hypercalcemia: Resolved.  *Chronic issue prior to admission and likely an indicator of decreased transit related to hypercalcemia influenced smooth muscle dysmotility *Patient had apparent excellent results with utilization of milk of magnesia and Dulcolax suppository at home so we'll reorder these *Continue twice a day Colace and add at hour of sleep MiraLax *TSH is normal at 1.662  Disposition Awaiting inpatient rehab evaluation.    LOS: 8 days   [facility]  [facility]  Pager [phone]  If 7PM-7AM, please contact night-coverage  [email or web address]  Password [email or web address]

## 2012-04-11 NOTE — Consult Note (Signed)
Physical Medicine and Rehabilitation Consult Reason for Consult: SDH with dizziness Referring Physician:  Dr. Blake Divine   HPI: [name] is a 76 y.o. female with hx anxiety, HTN, DDD presented 7/8 with 3 month hx back pain, extremity weakness and urinary incontinence. She was admitted to [facility]  On 7/08 by [facility] and workup revealed acute renal failure, hypercalcemia, proteinuria and MRI L spine showed innumerable lesions. Heme/Onc was consulted for workup for multiple myeloma. Pt was improving and unfortunately sustained an unwitnessed fall 7/10 with subsequent subdural hematoma. She was tx to [facility] for Dr. Gerlene Fee consulted for input and recommended follow up CT for stability.   She remained neurologically stable and did not require any surgical intervention. CT head on 07/11 with slight improvement.  Bone biopsy done on 07/12 and results pending. continues on prednisone for ?MM.   PT/OT re-evaluations done on 07/12 and CIR recommended for progression. Patient with complaints of vertigo and vestibular evaluation recommended.    Review of Systems  Eyes: Positive for pain. Negative for double vision.  Cardiovascular: Negative for chest pain and palpitations.  Gastrointestinal: Positive for constipation. Negative for heartburn.       Sore throat  Genitourinary: Positive for urgency.  Musculoskeletal: Positive for back pain and joint pain.  Neurological: Positive for dizziness (goes away when eyes closed) and headaches.  All other systems reviewed and are negative.   Past Medical History  Diagnosis Date  . Anxiety   . Hypertension   . Hyperlipidemia   . Depression   . Osteopenia    Past Surgical History  Procedure Date  . Breast lumpectomy    Family History  Problem Relation Age of Onset  . Heart disease Mother   . Hyperlipidemia Sister   . Hypertension Sister   . Diabetes Sister   . Diabetes Sister   . Hyperlipidemia Sister   . Hypertension Sister   . Sudden death  Neg Hx   . Heart attack Neg Hx    Social History:  Married. Did work out of home for a few years.  She  reports that she has never smoked. She has never used smokeless tobacco. She reports that she does not drink alcohol or use illicit drugs. Has had problems with back past 6 months with problems walking-worse in the last 4 weeks. Husband supportive and can assist past discharge.   Allergies  Allergen Reactions  . Simvastatin Other (See Comments)    Joint aches    Medications Prior to Admission  Medication Sig Dispense Refill  . ALPRAZolam (XANAX) 1 MG tablet Take 1 mg by mouth at bedtime as needed. Sleep.      . butalbital-aspirin-caffeine (FIORINAL) 50-325-40 MG per capsule Take 1 capsule by mouth 2 (two) times daily as needed. Headache.      . cyclobenzaprine (FLEXERIL) 5 MG tablet Take 5 mg by mouth 3 (three) times daily as needed. Muscle spasms.      . meloxicam (MOBIC) 15 MG tablet Take 15 mg by mouth daily.      . Olmesartan-Amlodipine-HCTZ (TRIBENZOR) 40-10-12.5 MG TABS Take 1 tablet by mouth daily.      Marland Kitchen oxyCODONE-acetaminophen (PERCOCET) 7.5-325 MG per tablet Take 1 tablet by mouth every 4 (four) hours as needed. Every 4 to 6 hours as needed for pain.      Marland Kitchen QUEtiapine (SEROQUEL) 100 MG tablet Take 100 mg by mouth at bedtime.      . rosuvastatin (CRESTOR) 10 MG tablet Take 10 mg by  mouth daily.      . traMADol (ULTRAM) 50 MG tablet Take 25 mg by mouth at bedtime as needed. Pain.      . predniSONE (STERAPRED UNI-PAK) 10 MG tablet 6 tabs po day 1, 5 tabs po day 2, 4 tabs po day 3, 3 tabs po day 4, 2 tabs po day 5, 1 tab po day 6  21 tablet  0    Home: Home Living Lives With: Spouse Available Help at Discharge: Family Type of Home: Independent living facility (senior apartments) Firefighter: Handicapped height (grab bars both sides) Home Adaptive Equipment: Wheelchair - manual  Functional History: Prior Function Bath: Moderate Toileting: Minimal Dressing:  Moderate Meal Prep: Total Light Housekeeping: Total Functional Status:  Mobility: Bed Mobility Bed Mobility: Supine to Sit;Sitting - Scoot to Edge of Bed Supine to Sit: 4: Min assist Sitting - Scoot to Edge of Bed: 3: Mod assist Sit to Supine: 3: Mod assist Transfers Transfers: Sit to Stand;Stand to Sit Sit to Stand: 4: Min assist;With upper extremity assist Stand to Sit: 4: Min assist;With upper extremity assist Ambulation/Gait Ambulation/Gait Assistance: 4: Min assist Ambulation Distance (Feet): 40 Feet Assistive device: Rolling walker Ambulation/Gait Assistance Details: VC for proper sequencing and safety with RW. Distance limited due to pt fatigue and complaints of LE's feelign weak with ambulation.  Gait Pattern: Step-through pattern;Decreased stride length;Shuffle;Narrow base of support Gait velocity: decreased    ADL: ADL Eating/Feeding: Simulated;Independent Where Assessed - Eating/Feeding: Edge of bed Grooming: Simulated;Minimal assistance Where Assessed - Grooming: Supported standing Upper Body Bathing: Simulated;Set up Where Assessed - Upper Body Bathing: Unsupported sitting Lower Body Bathing: Simulated;Minimal assistance Where Assessed - Lower Body Bathing: Unsupported sit to stand Upper Body Dressing: Simulated;Set up Where Assessed - Upper Body Dressing: Unsupported sitting Lower Body Dressing: Simulated;Minimal assistance Where Assessed - Lower Body Dressing: Unsupported sit to stand Toilet Transfer: Performed;Minimal assistance Toilet Transfer Method: Other (comment) (Ambulate with RW.) Toilet Transfer Equipment: Raised toilet seat with arms (or 3-in-1 over toilet) Equipment Used: Rolling walker Transfers/Ambulation Related to ADLs: Pt able to ambulate with RW and overall min facilitation for safety and balance.  Needs mod instructional cues to utilize walker correctly. ADL Comments: Pt overall min assist for simulated selfcare tasks.  Needs mod  instructional cueing for hand placement with sit to stand to avoild pulling up on walker.  Noted increased dizziness with sit to supine.  May be indicative of underlying vestibular issue.  Cognition: Cognition Arousal/Alertness: Awake/alert Orientation Level: Oriented X4 Cognition Overall Cognitive Status: Impaired Area of Impairment: Safety/judgement Arousal/Alertness: Awake/alert Orientation Level: Oriented X4 / Intact Behavior During Session: WFL for tasks performed Safety/Judgement - Other Comments: Pt needs mod instructional cueing to utilize walker correctly and perform sit to stand safely. Cognition - Other Comments: Needed multiple cues for hand placement with sit to stand and stand to sit transitions.  Will continue to evaluate in function.  Blood pressure 130/75, pulse 88, temperature 97.9 F (36.6 C), temperature source Oral, resp. rate 18, height 5\' 7"  (1.702 m), weight 79.6 kg (175 lb 7.8 oz), SpO2 97.00%. Physical Exam  Nursing note and vitals reviewed. Constitutional: She is oriented to person, place, and time. She appears well-developed and well-nourished.       Hoarse voice.  HENT:  Head: Normocephalic and atraumatic.  Right Ear: External ear normal.  Left Ear: External ear normal.  Eyes: EOM are normal. Pupils are equal, round, and reactive to light. Right conjunctiva is injected. Left conjunctiva is injected.  Neck: Normal range of motion.  Cardiovascular: Normal rate and regular rhythm.   Pulmonary/Chest: Effort normal and breath sounds normal.  Abdominal: Soft. Bowel sounds are normal.  Musculoskeletal: She exhibits no edema.       Left great toe with dressing and tenderness due to recent surgery. Low back pain with MMT and trunk movements  Neurological: She is alert and oriented to person, place, and time. No cranial nerve deficit.       Difficulty following 2 step commands.  Decreased coordination LLE.  Decreased insight, decreased awareness of deficits.  Strength grossly 4/5 with more weakness proximally. Fair thought content and memory  Skin: Skin is warm and dry.  Psychiatric: She has a normal mood and affect. Her behavior is normal.    No results found for this or any previous visit (from the past 24 hour(s)). No results found.  Assessment/Plan: Diagnosis: Multiple myeloma, SDH after a fall 1. Does the need for close, 24 hr/day medical supervision in concert with the patient's rehab needs make it unreasonable for this patient to be served in a less intensive setting? Yes 2. Co-Morbidities requiring supervision/potential complications: htn, anemia 3. Due to bladder management, bowel management, safety, skin/wound care, disease management, medication administration, pain management and patient education, does the patient require 24 hr/day rehab nursing? Yes 4. Does the patient require coordinated care of a physician, rehab nurse, PT (1-2 hrs/day, 5 days/week), OT (1-2 hrs/day, 5 days/week) and SLP (1-2 hrs/day, 5 days/week) to address physical and functional deficits in the context of the above medical diagnosis(es)? Yes Addressing deficits in the following areas: balance, endurance, locomotion, strength, transferring, bowel/bladder control, bathing, dressing, feeding, grooming, toileting, cognition, speech, language and psychosocial support 5. Can the patient actively participate in an intensive therapy program of at least 3 hrs of therapy per day at least 5 days per week? Yes 6. The potential for patient to make measurable gains while on inpatient rehab is excellent 7. Anticipated functional outcomes upon discharge from inpatient rehab are mod I with PT, mod I to supervision with OT, mod I to supervision with SLP. 8. Estimated rehab length of stay to reach the above functional goals is: 7-10 days 9. Does the patient have adequate social supports to accommodate these discharge functional goals? Yes 10. Anticipated D/C setting:  Home 11. Anticipated post D/C treatments: HH therapy 12. Overall Rehab/Functional Prognosis: excellent  RECOMMENDATIONS: This patient's condition is appropriate for continued rehabilitative care in the following setting: CIR Patient has agreed to participate in recommended program. Yes Note that insurance prior authorization may be required for reimbursement for recommended care.  Comment: Rehab RN to follow up.  Ivory Broad, MD    04/11/2012

## 2012-04-12 ENCOUNTER — Other Ambulatory Visit: Payer: Self-pay | Admitting: Oncology

## 2012-04-12 ENCOUNTER — Inpatient Hospital Stay (HOSPITAL_COMMUNITY)
Admission: RE | Admit: 2012-04-12 | Discharge: 2012-04-21 | DRG: 945 | Disposition: A | Discharge status: Home Health | Payer: Medicare Other | Source: Ambulatory Visit | Attending: Physical Medicine & Rehabilitation | Admitting: Physical Medicine & Rehabilitation

## 2012-04-12 ENCOUNTER — Inpatient Hospital Stay (HOSPITAL_COMMUNITY): Payer: Medicare Other | Admitting: Occupational Therapy

## 2012-04-12 DIAGNOSIS — R7989 Other specified abnormal findings of blood chemistry: Secondary | ICD-10-CM

## 2012-04-12 DIAGNOSIS — E669 Obesity, unspecified: Secondary | ICD-10-CM

## 2012-04-12 DIAGNOSIS — M25559 Pain in unspecified hip: Secondary | ICD-10-CM

## 2012-04-12 DIAGNOSIS — D649 Anemia, unspecified: Secondary | ICD-10-CM

## 2012-04-12 DIAGNOSIS — K5900 Constipation, unspecified: Secondary | ICD-10-CM | POA: Diagnosis present

## 2012-04-12 DIAGNOSIS — M545 Low back pain, unspecified: Secondary | ICD-10-CM

## 2012-04-12 DIAGNOSIS — W19XXXA Unspecified fall, initial encounter: Secondary | ICD-10-CM | POA: Diagnosis present

## 2012-04-12 DIAGNOSIS — M549 Dorsalgia, unspecified: Secondary | ICD-10-CM

## 2012-04-12 DIAGNOSIS — E785 Hyperlipidemia, unspecified: Secondary | ICD-10-CM

## 2012-04-12 DIAGNOSIS — K5901 Slow transit constipation: Secondary | ICD-10-CM

## 2012-04-12 DIAGNOSIS — M899 Disorder of bone, unspecified: Secondary | ICD-10-CM

## 2012-04-12 DIAGNOSIS — Z5189 Encounter for other specified aftercare: Secondary | ICD-10-CM

## 2012-04-12 DIAGNOSIS — Y998 Other external cause status: Secondary | ICD-10-CM

## 2012-04-12 DIAGNOSIS — S065X0A Traumatic subdural hemorrhage without loss of consciousness, initial encounter: Secondary | ICD-10-CM | POA: Diagnosis present

## 2012-04-12 DIAGNOSIS — F3289 Other specified depressive episodes: Secondary | ICD-10-CM

## 2012-04-12 DIAGNOSIS — Z780 Asymptomatic menopausal state: Secondary | ICD-10-CM

## 2012-04-12 DIAGNOSIS — C9030 Solitary plasmacytoma not having achieved remission: Secondary | ICD-10-CM

## 2012-04-12 DIAGNOSIS — F329 Major depressive disorder, single episode, unspecified: Secondary | ICD-10-CM

## 2012-04-12 DIAGNOSIS — I6200 Nontraumatic subdural hemorrhage, unspecified: Secondary | ICD-10-CM

## 2012-04-12 DIAGNOSIS — E860 Dehydration: Secondary | ICD-10-CM

## 2012-04-12 DIAGNOSIS — S065XAA Traumatic subdural hemorrhage with loss of consciousness status unknown, initial encounter: Secondary | ICD-10-CM

## 2012-04-12 DIAGNOSIS — M898X9 Other specified disorders of bone, unspecified site: Secondary | ICD-10-CM

## 2012-04-12 DIAGNOSIS — N179 Acute kidney failure, unspecified: Secondary | ICD-10-CM

## 2012-04-12 DIAGNOSIS — C9000 Multiple myeloma not having achieved remission: Secondary | ICD-10-CM

## 2012-04-12 DIAGNOSIS — I10 Essential (primary) hypertension: Secondary | ICD-10-CM

## 2012-04-12 DIAGNOSIS — N63 Unspecified lump in unspecified breast: Secondary | ICD-10-CM

## 2012-04-12 DIAGNOSIS — M949 Disorder of cartilage, unspecified: Secondary | ICD-10-CM

## 2012-04-12 DIAGNOSIS — G4700 Insomnia, unspecified: Secondary | ICD-10-CM

## 2012-04-12 DIAGNOSIS — S065X9A Traumatic subdural hemorrhage with loss of consciousness of unspecified duration, initial encounter: Secondary | ICD-10-CM

## 2012-04-12 DIAGNOSIS — F411 Generalized anxiety disorder: Secondary | ICD-10-CM

## 2012-04-12 LAB — PTH-RELATED PEPTIDE: PTH-related peptide: 16 pg/mL (ref 14–27)

## 2012-04-12 MED ORDER — POLYETHYLENE GLYCOL 3350 17 G PO PACK: 17.0000 g | PACK | Freq: Every day | ORAL | Status: DC

## 2012-04-12 MED ORDER — TETRAHYDROZOLINE HCL 0.05 % OP SOLN
1.0000 [drp] | Freq: Two times a day (BID) | OPHTHALMIC | Status: DC
  Administered 2012-04-12 – 2012-04-21 (×18): 1 [drp] via OPHTHALMIC
  Filled 2012-04-12: qty 15

## 2012-04-12 MED ORDER — MENTHOL 3 MG MT LOZG
1.0000 | LOZENGE | OROMUCOSAL | Status: DC | PRN
  Filled 2012-04-12: qty 9

## 2012-04-12 MED ORDER — DEXAMETHASONE 4 MG PO TABS: 40.0000 mg | ORAL_TABLET | ORAL | Status: DC

## 2012-04-12 MED ORDER — PREDNISONE 10 MG PO TABS
10.0000 mg | ORAL_TABLET | Freq: Every day | ORAL | Status: AC
  Administered 2012-04-13 – 2012-04-14 (×2): 10 mg via ORAL
  Filled 2012-04-12 (×3): qty 1

## 2012-04-12 MED ORDER — LENALIDOMIDE 15 MG PO CAPS: 15.0000 mg | ORAL_CAPSULE | Freq: Every day | ORAL | Status: DC

## 2012-04-12 MED ORDER — BISACODYL 10 MG RE SUPP: 10.0000 mg | Freq: Every day | RECTAL | Status: DC | PRN

## 2012-04-12 MED ORDER — ONDANSETRON HCL 4 MG/2ML IJ SOLN: 4.0000 mg | Freq: Four times a day (QID) | INTRAMUSCULAR | Status: DC | PRN

## 2012-04-12 MED ORDER — ACYCLOVIR 200 MG PO CAPS
400.0000 mg | ORAL_CAPSULE | Freq: Every day | ORAL | Status: DC
  Administered 2012-04-13 – 2012-04-21 (×9): 400 mg via ORAL
  Filled 2012-04-12 (×10): qty 2

## 2012-04-12 MED ORDER — ACETAMINOPHEN 325 MG PO TABS
325.0000 mg | ORAL_TABLET | ORAL | Status: DC | PRN
  Administered 2012-04-13 – 2012-04-15 (×5): 650 mg via ORAL
  Filled 2012-04-12 (×7): qty 2

## 2012-04-12 MED ORDER — PREDNISONE (PAK) 10 MG PO TABS: 10.0000 mg | ORAL_TABLET | Freq: Every day | ORAL | Status: DC

## 2012-04-12 MED ORDER — ACYCLOVIR 400 MG PO TABS: 400.0000 mg | ORAL_TABLET | Freq: Every day | ORAL | Status: DC

## 2012-04-12 MED ORDER — TRAMADOL HCL 50 MG PO TABS: 25.0000 mg | ORAL_TABLET | Freq: Every evening | ORAL | Status: DC | PRN

## 2012-04-12 MED ORDER — OXYCODONE HCL 5 MG PO TABS
5.0000 mg | ORAL_TABLET | ORAL | Status: DC | PRN
  Administered 2012-04-12 – 2012-04-20 (×18): 10 mg via ORAL
  Filled 2012-04-12 (×18): qty 2

## 2012-04-12 MED ORDER — ALUM & MAG HYDROXIDE-SIMETH 200-200-20 MG/5ML PO SUSP: 30.0000 mL | ORAL | Status: DC | PRN

## 2012-04-12 MED ORDER — GUAIFENESIN-DM 100-10 MG/5ML PO SYRP: 10.0000 mL | ORAL_SOLUTION | ORAL | Status: DC | PRN

## 2012-04-12 MED ORDER — ONDANSETRON HCL 4 MG PO TABS
4.0000 mg | ORAL_TABLET | Freq: Four times a day (QID) | ORAL | Status: DC | PRN
  Filled 2012-04-12: qty 2

## 2012-04-12 MED ORDER — CHLORHEXIDINE GLUCONATE 0.12 % MT SOLN
15.0000 mL | Freq: Two times a day (BID) | OROMUCOSAL | Status: DC
  Administered 2012-04-12 – 2012-04-21 (×17): 15 mL via OROMUCOSAL
  Filled 2012-04-12 (×20): qty 15

## 2012-04-12 MED ORDER — PROCHLORPERAZINE MALEATE 10 MG PO TABS: 10.0000 mg | ORAL_TABLET | Freq: Four times a day (QID) | ORAL | Status: DC | PRN

## 2012-04-12 MED ORDER — ALBUTEROL SULFATE (5 MG/ML) 0.5% IN NEBU
2.5000 mg | INHALATION_SOLUTION | RESPIRATORY_TRACT | Status: DC | PRN
  Filled 2012-04-12: qty 0.5

## 2012-04-12 MED ORDER — FLEET ENEMA 7-19 GM/118ML RE ENEM
1.0000 | ENEMA | Freq: Once | RECTAL | Status: AC | PRN
  Filled 2012-04-12: qty 1

## 2012-04-12 MED ORDER — ONDANSETRON HCL 8 MG PO TABS
8.0000 mg | ORAL_TABLET | Freq: Once | ORAL | Status: AC
  Administered 2012-04-12: 8 mg via ORAL
  Filled 2012-04-12: qty 1

## 2012-04-12 MED ORDER — DEXAMETHASONE 6 MG PO TABS
40.0000 mg | ORAL_TABLET | ORAL | Status: DC
  Administered 2012-04-19: 40 mg via ORAL
  Filled 2012-04-12: qty 1

## 2012-04-12 MED ORDER — BORTEZOMIB CHEMO SQ INJECTION 3.5 MG (2.5MG/ML): 1.3000 mg/m2 | Freq: Once | INTRAMUSCULAR | Status: DC

## 2012-04-12 MED ORDER — DSS 100 MG PO CAPS: 100.0000 mg | ORAL_CAPSULE | Freq: Two times a day (BID) | ORAL | Status: DC

## 2012-04-12 MED ORDER — ALPRAZOLAM 0.5 MG PO TABS: 1.0000 mg | ORAL_TABLET | Freq: Every evening | ORAL | Status: DC | PRN

## 2012-04-12 MED ORDER — METHOCARBAMOL 500 MG PO TABS
500.0000 mg | ORAL_TABLET | Freq: Four times a day (QID) | ORAL | Status: DC | PRN
  Administered 2012-04-12 – 2012-04-19 (×4): 500 mg via ORAL
  Filled 2012-04-12 (×6): qty 1

## 2012-04-12 MED ORDER — PREDNISONE 5 MG PO TABS
5.0000 mg | ORAL_TABLET | Freq: Every day | ORAL | Status: AC
  Administered 2012-04-15 – 2012-04-16 (×2): 5 mg via ORAL
  Filled 2012-04-12 (×3): qty 1

## 2012-04-12 MED ORDER — TETRAHYDROZOLINE HCL 0.05 % OP SOLN: 1.0000 [drp] | Freq: Two times a day (BID) | OPHTHALMIC | Status: DC

## 2012-04-12 MED ORDER — DOCUSATE SODIUM 100 MG PO CAPS
100.0000 mg | ORAL_CAPSULE | Freq: Two times a day (BID) | ORAL | Status: DC
  Administered 2012-04-12 – 2012-04-21 (×18): 100 mg via ORAL
  Filled 2012-04-12 (×20): qty 1

## 2012-04-12 MED ORDER — ATORVASTATIN CALCIUM 20 MG PO TABS
20.0000 mg | ORAL_TABLET | Freq: Every day | ORAL | Status: DC
  Administered 2012-04-12 – 2012-04-20 (×8): 20 mg via ORAL
  Filled 2012-04-12 (×10): qty 1

## 2012-04-12 MED ORDER — BORTEZOMIB CHEMO SQ INJECTION 3.5 MG (2.5MG/ML)
1.3000 mg/m2 | Freq: Once | INTRAMUSCULAR | Status: AC
  Administered 2012-04-12: 2.5 mg via SUBCUTANEOUS
  Filled 2012-04-12: qty 1

## 2012-04-12 MED ORDER — DIPHENHYDRAMINE HCL 12.5 MG/5ML PO ELIX: 12.5000 mg | ORAL_SOLUTION | Freq: Four times a day (QID) | ORAL | Status: DC | PRN

## 2012-04-12 MED ORDER — ONDANSETRON HCL 8 MG PO TABS: ORAL_TABLET | ORAL | Status: DC

## 2012-04-12 MED ORDER — POLYETHYLENE GLYCOL 3350 17 G PO PACK
17.0000 g | PACK | Freq: Two times a day (BID) | ORAL | Status: DC
  Administered 2012-04-12 – 2012-04-21 (×18): 17 g via ORAL
  Filled 2012-04-12 (×20): qty 1

## 2012-04-12 MED ORDER — BACITRACIN-NEOMYCIN-POLYMYXIN OINTMENT TUBE
TOPICAL_OINTMENT | Freq: Two times a day (BID) | CUTANEOUS | Status: DC
  Administered 2012-04-12 – 2012-04-21 (×16): via TOPICAL
  Filled 2012-04-12: qty 15

## 2012-04-12 MED ORDER — BACITRACIN ZINC 500 UNIT/GM EX OINT
TOPICAL_OINTMENT | Freq: Two times a day (BID) | CUTANEOUS | Status: DC
  Administered 2012-04-12 – 2012-04-21 (×17): via TOPICAL
  Filled 2012-04-12: qty 15

## 2012-04-12 MED ORDER — BACITRACIN-NEOMYCIN-POLYMYXIN OINTMENT TUBE: 1.0000 "application " | TOPICAL_OINTMENT | Freq: Two times a day (BID) | CUTANEOUS | Status: DC

## 2012-04-12 MED ORDER — OXYCODONE-ACETAMINOPHEN 7.5-325 MG PO TABS: 1.0000 | ORAL_TABLET | ORAL | Status: DC | PRN

## 2012-04-12 NOTE — Progress Notes (Signed)
Occupational Therapy Treatment Patient Details Name: [name] MRN: [id number] DOB: August 20, 1936 Today's Date: 04/12/2012 Time: 8469-6295 OT Time Calculation (min): 24 min  OT Assessment / Plan / Recommendation Comments on Treatment Session Pt continuing to make steady progress towards goals. Fewer safety cues needed today for safety.    Follow Up Recommendations  Inpatient Rehab    Barriers to Discharge       Equipment Recommendations  Defer to next venue    Recommendations for Other Services    Frequency Min 2X/week   Plan Discharge plan remains appropriate    Precautions / Restrictions Precautions Precautions: Fall Restrictions Weight Bearing Restrictions: No   Pertinent Vitals/Pain     ADL  Grooming: Performed;Wash/dry face;Teeth care;Brushing hair;Supervision/safety Where Assessed - Grooming: Unsupported standing Upper Body Bathing: Performed;Set up Where Assessed - Upper Body Bathing: Unsupported sitting Lower Body Bathing: Minimal assistance;Moderate assistance Where Assessed - Lower Body Bathing: Supported sit to stand Upper Body Dressing: Performed;Set up Where Assessed - Upper Body Dressing: Unsupported sitting Lower Body Dressing: Performed;Maximal assistance (to don/doff socks. Pt with increased back pain today.) Where Assessed - Lower Body Dressing: Unsupported sitting Toilet Transfer: Performed;Min guard Toilet Transfer Method: Sit to Barista: Regular height toilet Toileting - Clothing Manipulation and Hygiene: Performed;Min guard Where Assessed - Engineer, mining and Hygiene: Sit on 3-in-1 or toilet Equipment Used: Rolling walker Transfers/Ambulation Related to ADLs: Ambulated to the bathroom with minguard A and supervision. Less directional cueing needed to day manipulating RW. ADL Comments: Pt required A to bathe lower legs and feet, don socks. Max cues for thoroughness with bath. Pt had tendency to wash the  same body parts over and over.    OT Diagnosis:    OT Problem List:   OT Treatment Interventions:     OT Goals ADL Goals ADL Goal: Grooming - Progress: Progressing toward goals ADL Goal: Lower Body Bathing - Progress: Progressing toward goals ADL Goal: Lower Body Dressing - Progress: Progressing toward goals ADL Goal: Toilet Transfer - Progress: Progressing toward goals ADL Goal: Toileting - Clothing Manipulation - Progress: Progressing toward goals ADL Goal: Toileting - Hygiene - Progress: Progressing toward goals ADL Goal: Additional Goal #1 - Progress: Progressing toward goals  Visit Information  Last OT Received On: 04/12/12 Assistance Needed: +1    Subjective Data  Subjective: I believe Im doing better with this walker.   Prior Functioning       Cognition  Overall Cognitive Status: Impaired Area of Impairment: Attention;Safety/judgement;Awareness of errors Arousal/Alertness: Awake/alert Orientation Level: Appears intact for tasks assessed Behavior During Session: [facility] for tasks performed Current Attention Level: Selective Safety/Judgement: Decreased awareness of safety precautions;Decreased safety judgement for tasks assessed Awareness of Errors: Assistance required to identify errors made;Assistance required to correct errors made Cognition - Other Comments: Improved from yesterday's session.    Mobility Bed Mobility Rolling Right: 5: Supervision Right Sidelying to Sit: 4: Min guard;HOB flat;With rails Details for Bed Mobility Assistance: Pt c/o no dizziness and required no physical A. VCs to sequence. Transfers Sit to Stand: 4: Min guard;With upper extremity assist;From bed;From toilet Stand to Sit: 4: Min guard;With upper extremity assist;To chair/3-in-1;To toilet;With armrests Details for Transfer Assistance: Pt initiated use of the grab bars in the bathroom today and only required 1 VC for hand placement.   Exercises    Balance Static Standing Balance Static  Standing - Balance Support: No upper extremity supported;During functional activity Static Standing - Level of Assistance: 5: Stand by assistance  End of Session OT - End of Session Activity Tolerance: Patient tolerated treatment well Patient left: in chair;with call bell/phone within reach;with family/visitor present  GO     Deseri Loss A OTR/L [phone] 04/12/2012, 9:41 AM

## 2012-04-12 NOTE — Discharge Summary (Signed)
Physician Discharge Summary  [name]:[id number] DOB: 1935-12-09 DOA: 04/03/2012  PCP: [name], MD  Admit date: 04/03/2012 Discharge date: 04/12/2012  Recommendations for Outpatient Follow-up:  1. Follow with Dr HA as recommended.   Discharge Diagnoses:  Principal Problem:  *Acute subdural hematoma Active Problems:  HYPERTENSION  Dehydration  Acute renal failure  Hypercalcemia  Hip pain  Anemia  Multiple myeloma  Lytic lesion of bone on x-ray  Constipation due to slow transit/hypercalcemia  Plasmacytoma of bone-right seventh rib   Discharge Condition: stable.  Diet recommendation: GENERAL DIET.  History of present illness:  76yo female with hx anxiety, HTN presented 7/8 with 3 month hx back pain, extremity weakness and urinary incontinence. She was admitted to [facility] by [facility] and workup revealed acute renal failure, hypercalcemia, proteinuria and MRI L spine showed innumerable lesions. Heme/Onc was consulted for workup for multiple myeloma. Pt was improving and unfortunately sustained an unwitnessed fall 7/10 with subsequent subdural hematoma. She was tx to [facility] for Neurosurgery evaluation and PCCM asked to assume care. She remained neurologically stable and did not require any surgical intervention and was otherwise deemed appropriate to transfer out of ICU to the step down unit. [facility] hospitalists assumed care of the patient on 04/08/2012. Her BM biopsy report came back diagnostic of plasmacytoma with pending cytogenetics. She was started on chemo regimen by Dr HA. She was also evaluated by Inpatient Rehabilitation and she is being discharged to inpatient rehab.      Hospital Course:   Acute subdural hematoma after fall in hospital : *Neurosurgery has been following and no indications for surgery - f/u CT head reveals decrease in size of hematoma . Please follow up with a repeat CT HEAD if there are any mental status change.  *No neurological  deficit clinically  Acute renal failure  *Likely related to multiple myeloma  *Creatinine has trended downward to 1 after a peak of 2.45 . Encourage hydration.   Multiple myeloma  *Patient underwent a bone marrow biopsy in interventional radiology 04/08/2012, BM biopsy report came back diagnostic of plasmacytoma with pending cytogenetics  *Dr. Gaylyn Rong with Hematology started chemo therapy.  She will be started on velcade sq once a week for 3 weeks followed by 1 week off.  She was also started on acyclovir 400 mg daily, and once a week decadron. Meanwhile she will be on prednisone taper for the subdural hematoma.   *On a prednisone  Taper.which is also being utilized to decrease cerebral edema, s/p subdural hematoma .  *Large soft tissue mass destroying the lateral aspect of the seventh rib on the right most likely due to multiple myeloma.   Dehydration  Resolved w/ IVF resuscitation - NSL IVF and follow .  Hypercalcemia  *Likely secondary to  diagnosis of multiple myeloma  *Calcium stable at 10.7 after hydration  *She has been cleared by the dental service to receive pamidronate .  Hip pain/Lytic lesion of bone on x-ray  *Likely due to multiple myeloma  *Mobilize with the assistance of PT   HYPERTENSION  *Blood pressure controlled . Resume home medications on discharge.  Anemia  *Hemoglobin stable between 9 to10 range   Constipation due to slow transit/hypercalcemia:  Resolved.  *Chronic issue prior to admission and likely an indicator of decreased transit related to hypercalcemia influenced smooth muscle dysmotility  *Patient had apparent excellent results with utilization of milk of magnesia and Dulcolax suppository at home so we'll reorder these on discharged *Continue twice a day Colace  and add at hour of sleep MiraLax  *TSH is normal at 1.662     Procedures:  CT GUIDED BIOPSY  Consultations:  NEURO SURGERY/DR KRITZER  HEMATOLOGY/ ONCOLOGY/ DR HA  PHYSICAL  THERAPY  INTERVENTION RADIOLOGY  PCCM/DR YACOUB   Discharge Exam: Filed Vitals:   04/12/12 0624  BP: 131/94  Pulse: 94  Temp: 98.7 F (37.1 C)  Resp: 16   Filed Vitals:   04/11/12 0625 04/11/12 1432 04/11/12 2115 04/12/12 0624  BP: 130/75 122/78 149/84 131/94  Pulse: 88 89 93 94  Temp: 97.9 F (36.6 C) 98 F (36.7 C) 98.2 F (36.8 C) 98.7 F (37.1 C)  TempSrc: Oral Oral Oral Oral  Resp: 18 18 18 16   Height:      Weight:      SpO2: 97% 97% 98% 93%   General appearance: alert, cooperative, appears stated age and no distress  Resp: clear to auscultation bilaterally, on room air with O2 saturations 96-98%  Cardio: regular rate and sinus rhythm, S1, S2 normal, no murmur, click, rub or gallop  GI: soft, non-tender; bowel sounds normal; no masses, no organomegaly  Extremities: extremities normal, atraumatic, no cyanosis or edema  Neurologic: Grossly normal   Discharge Instructions  Discharge Orders    Future Orders Please Complete By Expires   Diet general      Discharge instructions      Comments:   FOLLOW UP WITH HEMATOLOGY AS RECOMMENDED.   Activity as tolerated - No restrictions        Medication List  As of 04/12/2012 12:07 PM   STOP taking these medications         meloxicam 15 MG tablet      traMADol 50 MG tablet         TAKE these medications         acyclovir 400 MG tablet   Commonly known as: ZOVIRAX   Take 1 tablet (400 mg total) by mouth daily.      acyclovir 400 MG tablet   Commonly known as: ZOVIRAX   Take 1 tablet (400 mg total) by mouth daily.      ALPRAZolam 1 MG tablet   Commonly known as: XANAX   Take 1 mg by mouth at bedtime as needed. Sleep.      bisacodyl 10 MG suppository   Commonly known as: DULCOLAX   Place 1 suppository (10 mg total) rectally daily as needed.      bortezomib SQ 3.5 MG Solr   Commonly known as: VELCADE   Inject 1 mL (2.5 mg total) into the skin once.      butalbital-aspirin-caffeine 50-325-40 MG per  capsule   Commonly known as: FIORINAL   Take 1 capsule by mouth 2 (two) times daily as needed. Headache.      cyclobenzaprine 5 MG tablet   Commonly known as: FLEXERIL   Take 5 mg by mouth 3 (three) times daily as needed. Muscle spasms.      dexamethasone 4 MG tablet   Commonly known as: DECADRON   Take 10 tablets (40 mg total) by mouth once a week.      DSS 100 MG Caps   Take 100 mg by mouth 2 (two) times daily.      lenalidomide 15 MG capsule   Commonly known as: REVLIMID   Take 1 capsule (15 mg total) by mouth daily.      neomycin-bacitracin-polymyxin Oint   Commonly known as: NEOSPORIN   Apply 1 application topically  2 (two) times daily.      ondansetron 8 MG tablet   Commonly known as: ZOFRAN   Take 1 tablet two times a day starting the day after chemo for 2 days. Then take 1 tablet two times a day as needed for nausea or vomiting.      ondansetron 8 MG tablet   Commonly known as: ZOFRAN   Take 1 tablet two times a day starting the day after chemo for 2 days. Then take 1 tablet two times a day as needed for nausea or vomiting.      oxyCODONE-acetaminophen 7.5-325 MG per tablet   Commonly known as: PERCOCET   Take 1 tablet by mouth every 4 (four) hours as needed. Every 4 to 6 hours as needed for pain.      polyethylene glycol packet   Commonly known as: MIRALAX / GLYCOLAX   Take 17 g by mouth at bedtime.      predniSONE 10 MG tablet   Commonly known as: STERAPRED UNI-PAK   Take 1 tablet (10 mg total) by mouth daily.      prochlorperazine 10 MG tablet   Commonly known as: COMPAZINE   Take 1 tablet (10 mg total) by mouth every 6 (six) hours as needed (Nausea or vomiting).      prochlorperazine 10 MG tablet   Commonly known as: COMPAZINE   Take 1 tablet (10 mg total) by mouth every 6 (six) hours as needed (Nausea or vomiting).      QUEtiapine 100 MG tablet   Commonly known as: SEROQUEL   Take 100 mg by mouth at bedtime.      rosuvastatin 10 MG tablet    Commonly known as: CRESTOR   Take 10 mg by mouth daily.      tetrahydrozoline 0.05 % ophthalmic solution   Place 1 drop into both eyes 2 (two) times daily.      TRIBENZOR 40-10-12.5 MG Tabs   Generic drug: Olmesartan-Amlodipine-HCTZ   Take 1 tablet by mouth daily.              The results of significant diagnostics from this hospitalization (including imaging, microbiology, ancillary and laboratory) are listed below for reference.    Significant Diagnostic Studies: Dg Orthopantogram  04/04/2012  *RADIOLOGY REPORT*  Clinical Data: 76 year old female.  Dental Pathology.  ORTHOPANTOGRAM/PANORAMIC  Comparison: None.  Findings: Residual dentition (incisors, canine, and left maxillary molar) demonstrate filling with no definite dental caries.  No peri apical lucency.  The remaining dentition is absent.  IMPRESSION: Dental fillings in the residual dentition.  No definite dental caries or peri apical lucency.  Original Report Authenticated By: Harley Hallmark, M.D.   X-ray Chest Pa And Lateral   04/04/2012  *RADIOLOGY REPORT*  Clinical Data: 76 year old female with cough shortness of breath chest pain.  CHEST - 2 VIEW  Comparison: 04/11/2007.  Lumbar MRI from 04/04/2012.  Findings: Upright AP and lateral views of the chest.  There is a peripheral right lung 6 cm mass-like opacity.  This may be pleural based or might be extrapleural.  Mediastinal and hilar contours appear stable and within normal limits. The anterior ribs in the region of the mass are indistinct.  The left lung appears stable and clear. Visualized tracheal air column is within normal limits.  No definite other osseous lesion identified.  IMPRESSION: 1.  Mass-like opacity in the right hemithorax, may be pleural based or could be extrapleural (indeterminate adjacent rib involvement). In light of the lumbar findings  earlier today, top differential considerations are multiple myeloma versus bronchogenic carcinoma of the right chest.  Chest  CT (IV contrast preferred) would characterize further. 2.  No other cardiopulmonary abnormality identified.  Original Report Authenticated By: Harley Hallmark, M.D.   Dg Lumbar Spine 2-3 Views  03/19/2012  *RADIOLOGY REPORT*  Clinical Data: Low back pain  LUMBAR SPINE - 2-3 VIEW  Comparison: 10/05/2008  Findings: Severe narrowing of the L2-3 and L3-4 discs.  Moderate narrowing at L4-5.  Minimal retrolisthesis L3 upon L4 unchanged. Levoscoliosis with the apex at L2-3.  No vertebral compression deformity.  Osteopenia.  Lower lumbar facet arthropathy.  IMPRESSION: Chronic and degenerative changes.  No acute bony pathology.  Original Report Authenticated By: Donavan Burnet, M.D.   Dg Hip Bilateral Vito Berger  04/04/2012  *RADIOLOGY REPORT*  Clinical Data: 76 year old female with pain.  Abnormal lumbar MRI.  BILATERAL HIP WITH PELVIS - 4+ VIEW  Comparison: Lumbar MRI from the same day reported separately. Lumbar radiographs 03/19/2012.  Findings: A subtle but asymmetric decreased bone mineralization of the proximal right femur.  A small (3-4 mm) and lucent lesions are noted in both intertrochanteric regions.  No proximal femoral pathologic fracture is identified.  There may be a small lucent lesions of the left iliac crest versus overlying bowel gas.  No pelvic fracture is identified.  Bone mineralization in the pelvis is similar to that seen in the visible lumbar spine.  IMPRESSION: Decreased bone mineralization in the pelvis and proximal femurs similar to that in the lumbar spine with possible superimposed small lucent lesions, but no pathologic fracture identified. Appearance compatible with widespread multiple myeloma (favored) or bone metastases.  Original Report Authenticated By: Harley Hallmark, M.D.   Ct Head Wo Contrast  04/07/2012  *RADIOLOGY REPORT*  Clinical Data: Follow-up acute subdural hematoma.  CT HEAD WITHOUT CONTRAST  Technique:  Contiguous axial images were obtained from the base of the skull  through the vertex without contrast.  Comparison: Prior scans from 04/06/2012.  Findings: Considerable motion degradation.  Overall study marginally diagnostic.  Slight improvement acute right subdural hematoma.  Thickness as measured at the level of the corpus callosum over the right frontotemporal convexity image 17 is 7.4 mm as compared with 10 - 11 mm previously.  Slight midline shift of 3-4 mm also appears improved.  Aside from midline shift, the brain is grossly within normal limits although motion significantly limits detection.  IMPRESSION: Slight improvement acute right subdural.  As measured on image 17, the hematoma thickness in the right frontotemporal region is 7 mm, decreased from 10-11 mm.  Original Report Authenticated By: Elsie Stain, M.D.   Ct Head Wo Contrast  04/06/2012  *RADIOLOGY REPORT*  Clinical Data: Follow-up subdural hematoma.  CT HEAD WITHOUT CONTRAST  Technique:  Contiguous axial images were obtained from the base of the skull through the vertex without contrast.  Comparison: CT head earlier in the day.  Findings: Predominantly acute right subdural hematoma overlying the frontal and parietal lobes extending to the convexity is redemonstrated.  Thickness as measured on image 22 is 12 mm in  the right posterior frontal region, unchanged from the scan 6 hours earlier. Midline shift as measured at the level of the septum pellucidum (image 15) is 7 mm right-to-left, also similar to priors.  There is mild atrophy with chronic microvascular ischemic change. Right-sided scalp hematoma redemonstrated.  Multiple punched out lesions in the  calvarium are indeterminate with differential as previously discussed for myeloma versus possible  metastatic disease.  IMPRESSION: Stable predominately acute right subdural hematoma.  12 mm thick as measured on image 22 right posterior frontal region.  7 mm right-to- left midline shift.  Original Report Authenticated By: Elsie Stain, M.D.   Ct Head Wo  Contrast  04/06/2012  *RADIOLOGY REPORT*  Clinical Data:  Status post fall; hit posterior head.  Concern for head or cervical spine injury.  CT HEAD WITHOUT CONTRAST AND CT CERVICAL SPINE WITHOUT CONTRAST  Technique:  Multidetector CT imaging of the head and cervical spine was performed following the standard protocol without intravenous contrast.  Multiplanar CT image reconstructions of the cervical spine were also generated.  Comparison: CT of the head performed 01/06/2005  CT HEAD  Findings:  There is a predominantly acute subdural hematoma overlying the right frontal parietal lobes, extending to the convexity.  This measures approximately 1.1 cm at the dominant acute component along the right parietal lobe, and up to 1.6 cm at the convexity.  Associated mass effect is noted, with up to 9 mm of leftward midline shift. There is no evidence of hydrocephalus.  No additional hemorrhage is identified.  There is no evidence of mass lesion; no acute infarct is identified.  Mild subcortical and periventricular white matter change likely reflects small vessel ischemic microangiopathy.  Mild cerebellar atrophy is noted.  The brainstem and fourth ventricle are within normal limits.  The basal ganglia are unremarkable in appearance.  There is no evidence of fracture; scattered small lytic foci within the upper calvarium may reflect multiple myeloma or less likely metastatic disease.  The orbits are within normal limits.  The paranasal sinuses and mastoid air cells are well-aerated.  Minimal soft tissue swelling is noted on the right side near the vertex.  IMPRESSION:  1.  Predominantly acute subdural hematoma overlying the right frontal and parietal lobes, extending to the convexity.  This measures 1.1 cm at the dominant acute component along the right parietal lobe, and up to 1.6 cm at the convexity.  Associated mass effect, with up to 9 mm of leftward midline shift. 2.  Minimal soft tissue swelling noted on the right side  near the vertex. 3.  Scattered small lytic foci within the upper calvarium may reflect multiple myeloma or less likely metastatic disease. 4.  Mild small vessel ischemic microangiopathy.  CT CERVICAL SPINE  Findings: There is no evidence of acute fracture or subluxation. There is minimal grade 1 anterolisthesis of C4 on C5, and mild narrowing of the intervertebral disc spaces at C5-C6 and C6-C7, with small associated anterior and posterior disc osteophyte complexes.  Vertebral bodies otherwise demonstrate normal height and alignment. Prevertebral soft tissues are within normal limits.  The thyroid gland is unremarkable in appearance.  The visualized lung apices are clear.  Calcification is noted at the carotid bifurcations bilaterally.  IMPRESSION:  1.  No evidence of acute fracture or subluxation along the cervical spine. 2.  Mild degenerative change noted along the lower cervical spine. 3.  Calcification noted at the carotid bifurcations bilaterally. Carotid ultrasound could be considered for further evaluation on an elective non-emergent basis, when and as deemed clinically appropriate.  Critical Value/emergent results were called by telephone at the time of interpretation on 04/06/2012  at 06:41 a.m.  to  Tama Gander NP, who verbally acknowledged these results.  Original Report Authenticated By: Tonia Ghent, M.D.   Ct Chest Wo Contrast  04/06/2012  *RADIOLOGY REPORT*  Clinical Data: Right lung mass.  CT CHEST WITHOUT CONTRAST  Technique:  Multidetector CT imaging of the chest was performed following the standard protocol without IV contrast.  Comparison: Chest x-ray dated 04/04/2012  Findings: There is a 4.4 x 5.7 x 5.2 cm mass destroying the lateral aspect of the right seventh rib.  It extends into the right hemithorax and compresses the adjacent lung.   There is no hilar or mediastinal adenopathy.  Minimal atelectasis at the left lung base.  Tiny right effusion.  There are multiple subtle tiny  lesions in the thoracic spine consistent with the lesions seen on the lumbar MRI dated 04/04/2012.  The heart is at the upper limits of normal in size.  There is a tiny hiatal hernia.  Visualized portion of the upper abdomen is normal.  IMPRESSION:  1.  Numerous tiny lesions throughout the thoracic spine consistent with multiple myeloma. 2.  Large soft tissue mass destroying the lateral aspect of the right seventh rib, most likely a large lesion of multiple myeloma (plasmocytoma).  Original Report Authenticated By: Gwynn Burly, M.D.   Ct Cervical Spine Wo Contrast  04/06/2012  *RADIOLOGY REPORT*  Clinical Data:  Status post fall; hit posterior head.  Concern for head or cervical spine injury.  CT HEAD WITHOUT CONTRAST AND CT CERVICAL SPINE WITHOUT CONTRAST  Technique:  Multidetector CT imaging of the head and cervical spine was performed following the standard protocol without intravenous contrast.  Multiplanar CT image reconstructions of the cervical spine were also generated.  Comparison: CT of the head performed 01/06/2005  CT HEAD  Findings:  There is a predominantly acute subdural hematoma overlying the right frontal parietal lobes, extending to the convexity.  This measures approximately 1.1 cm at the dominant acute component along the right parietal lobe, and up to 1.6 cm at the convexity.  Associated mass effect is noted, with up to 9 mm of leftward midline shift. There is no evidence of hydrocephalus.  No additional hemorrhage is identified.  There is no evidence of mass lesion; no acute infarct is identified.  Mild subcortical and periventricular white matter change likely reflects small vessel ischemic microangiopathy.  Mild cerebellar atrophy is noted.  The brainstem and fourth ventricle are within normal limits.  The basal ganglia are unremarkable in appearance.  There is no evidence of fracture; scattered small lytic foci within the upper calvarium may reflect multiple myeloma or less likely  metastatic disease.  The orbits are within normal limits.  The paranasal sinuses and mastoid air cells are well-aerated.  Minimal soft tissue swelling is noted on the right side near the vertex.  IMPRESSION:  1.  Predominantly acute subdural hematoma overlying the right frontal and parietal lobes, extending to the convexity.  This measures 1.1 cm at the dominant acute component along the right parietal lobe, and up to 1.6 cm at the convexity.  Associated mass effect, with up to 9 mm of leftward midline shift. 2.  Minimal soft tissue swelling noted on the right side near the vertex. 3.  Scattered small lytic foci within the upper calvarium may reflect multiple myeloma or less likely metastatic disease. 4.  Mild small vessel ischemic microangiopathy.  CT CERVICAL SPINE  Findings: There is no evidence of acute fracture or subluxation. There is minimal grade 1 anterolisthesis of C4 on C5, and mild narrowing of the intervertebral disc spaces at C5-C6 and C6-C7, with small associated anterior and posterior disc osteophyte complexes.  Vertebral bodies otherwise demonstrate normal height and alignment. Prevertebral soft tissues are within normal limits.  The  thyroid gland is unremarkable in appearance.  The visualized lung apices are clear.  Calcification is noted at the carotid bifurcations bilaterally.  IMPRESSION:  1.  No evidence of acute fracture or subluxation along the cervical spine. 2.  Mild degenerative change noted along the lower cervical spine. 3.  Calcification noted at the carotid bifurcations bilaterally. Carotid ultrasound could be considered for further evaluation on an elective non-emergent basis, when and as deemed clinically appropriate.  Critical Value/emergent results were called by telephone at the time of interpretation on 04/06/2012  at 06:41 a.m.  to  Tama Gander NP, who verbally acknowledged these results.  Original Report Authenticated By: Tonia Ghent, M.D.   Mr Lumbar Spine Wo  Contrast  04/04/2012  *RADIOLOGY REPORT*  Clinical Data: Low back pain and lower extremity weakness.  MRI LUMBAR SPINE WITHOUT CONTRAST  Technique:  Multiplanar and multiecho pulse sequences of the lumbar spine were obtained without intravenous contrast.  Comparison: Radiographs dated 03/19/2012 and CT scan of the abdomen dated 10/05/2008  Findings: The scan extends from T11-12 through S3.  Tip of the conus is at L1 and appears normal.  There are innumerable lesions throughout the visualized bones. There is a pathologic Schmorl's node in the inferior aspect of L3. The appearance is most typical for multiple myeloma.  T11-12:  The disc is normal.  T12-L1:  There is a Schmorl's node in the inferior endplate of T12 which may be related to the numerous lesions.  No disc bulging.  L1-2:  Small annular disc tear asymmetric bulge of the left of midline with no neural impingement.  L2-3:  Broad-based disc bulge with a small protrusion into the right neural foramen.  Right L2 nerve exits without impingement. Slight narrowing of the spinal canal and right lateral recess without focal neural impingement.  L3-4: There is a 5 mm retrolisthesis of L3 on L4 with broad-based bulge of the disc narrowing the spinal canal slightly without focal neural impingement.  L4-5:  Slight disc space narrowing with a tiny broad-based disc bulge with osteophyte formation and slight hypertrophy of the left facet joint and ligamentum flavum.  L5-S1:  The disc is normal.  Moderate right and mild left facet arthritis.  There are numerous lesions in the sacrum.  Incidental note is made of a 2.7 cm gallstone.  Gallbladder wall is not thickened.  IMPRESSION: 1.  There are innumerable lesions throughout the entire visualized portion of the skeleton, most likely multiple myeloma. Metastatic cancer can give the same appearance. 2.  Probable pathologic Schmorl's nodes in the inferior aspects of T12 and L3. 3.  No significant neural impingement in the lumbar  spine.  Critical Value/emergent results were called by telephone at the time of interpretation on 04/04/2012  at 12:19 p.m.  to  Dr. Vassie Loll, who verbally acknowledged these results.  Original Report Authenticated By: Gwynn Burly, M.D.   US Renal  04/04/2012  *RADIOLOGY REPORT*  Clinical Data: Renal failure.  Hypertension.  RENAL/URINARY TRACT ULTRASOUND COMPLETE  Comparison:  CT of 10/05/2008  Findings:  Right Kidney:  10.6 cm. No hydronephrosis.  Normal renal cortical thickness and echogenicity.  Left Kidney:  11.1 cm. No hydronephrosis.  Normal renal cortical thickness and echogenicity.  9 mm cyst or minimally complex cyst in the lower pole left kidney.  Bladder:  Within normal limits.  Incidental note is made of a 2.5 cm gallstone, without wall thickening or pericholecystic fluid.  IMPRESSION:  1. No acute process or explanation for renal  failure. 2.  Cholelithiasis.  Original Report Authenticated By: Consuello Bossier, M.D.   Ct Biopsy  04/08/2012  *RADIOLOGY REPORT*  Clinical history: 76 year old with findings suspicious for myeloma.   PROCEDURE(S): CT GUIDED BONE MARROW ASPIRATES AND BIOPSY  Physician: Rachelle Hora. Lowella Dandy, MD   Medications: Versed 1.5 mg, Fentanyl 25 mcg. A radiology nurse monitored the patient for moderate sedation.  Sedation time:  18 minutes   Procedure: The procedure was explained to the patient. The risks and benefits of the procedure were discussed and the patient's questions were addressed.  Informed consent was obtained from the patient. The patient was placed prone on CT scan. Images of the pelvis were obtained. The right side of back was prepped and draped in sterile fashion. The skin and right posterior iliac bone were anesthetized with 1% lidocaine.   11 gauge bone needle was directed into the right iliac bone with CT guidance. Two aspirates and two core biopsy obtained.   Findings: Needle placement within the right posterior iliac bone.  Complications: None   Impression: CT  guided bone marrow aspirates and core biopsy.  Original Report Authenticated By: Richarda Overlie, M.D.   Dg Bone Density  03/16/2012  *RADIOLOGY REPORT*  Clinical Data: 76 year old postmenopausal female with history of low bone mass.  The patient takes vitamin D.  DUAL X-RAY ABSORPTIOMETRY (DXA) FOR BONE MINERAL DENSITY  AP LUMBAR SPINE (L1 - L4)  Bone Mineral Density (BMD):            0.869 g/cm2 Young Adult T Score:                          -1.6 Z Score:                                                0.7  LEFT FEMUR (NECK)  Bone Mineral Density (BMD):             0.641 g/cm2 Young Adult T Score:                           -1.9 Z Score:                                                 0.2  ASSESSMENT:  Patient's diagnostic category is LOW BONE MASS by WHO Criteria.  FRACTURE RISK: MODERATE  FRAX: Based on the World Health Organization FRAX model, the 10 year probability of a major osteoporotic fracture is 12%.  The 10 year probability of a hip fracture is 2.6%.  Comparison: There has been a statistically significant 3.6% increase in BMD in the spine (likely reflecting increasing degenerative change) and a statistically significant 7.2% decrease in BMD in the total left hip as compared to 11/05/2008.  RECOMMENDATIONS:  Effective therapies are available in the form of bisphosphonates, selective estrogen receptor modulators, biologic agents, and hormone replacement therapy (for women).  All patients should ensure an adequate intake of dietary calcium (1200mg  daily) and vitamin D (800 IU daily) unless contraindicated.  All treatment decisions require clinical judgement and consideration of individual patient factors, including patient preferences, co-morbidities, previous drug use, risk factors not  captured in the FRAX model (e.g., frailty, falls, vitamin D deficiency, increased bone turnover, interval significant decline in bone density) and possible under-or over-estimation of fracture risk by FRAX.  The National  Osteoporosis Foundation recommends that FDA-approved medical therapies be considered in postmenopausal women and mean age 52 or older with a:        1)     Hip or vertebral (clinical or morphometric) fracture.           2)    T-score of -2.5 or lower at the spine or hip. 3)    Ten-year fracture probability by FRAX of 3% or greater for hip fracture or 20% or greater for major osteoporotic fracture. FOLLOW-UP:  People with diagnosed cases of osteoporosis or at high risk for fracture should have regular bone mineral density tests.  For patients eligible for Medicare, routine testing is allowed once every 2 years.  The testing frequency can be increased to one year for patients who have rapidly progressing disease, those who are receiving or discontinuing medical therapy to restore bone mass, or have additional risk factors.  World Science writer [facility]) Criteria:  Normal: T scores from +1.0 to -1.0 Low Bone Mass (Osteopenia): T scores between -1.0 and -2.5 Osteoporosis: T scores -2.5 and below  Comparison to Reference Population:  T score is the key measure used in the diagnosis of osteoporosis and relative risk determination for fracture.  It provides a value for bone mass relative to the mean bone mass of a young adult reference population expressed in terms of standard deviation (SD).  Z score is the age-matched score showing the patient's values compared to a population matched for age, sex, and race.  This is also expressed in terms of standard deviation.  The patient may have values that compare favorably to the age-matched values and still be at increased risk for fracture.  Original Report Authenticated By: Daryl Eastern, M.D.    Microbiology: Recent Results (from the past 240 hour(s))  MRSA PCR SCREENING     Status: Normal   Collection Time   04/06/12 10:05 AM      Component Value Range Status Comment   MRSA by PCR NEGATIVE  NEGATIVE Final      Labs: Basic Metabolic Panel:  Lab 04/09/12  0655 04/08/12 0420 04/07/12 0510 04/06/12 0420  NA 136 140 138 133*  K 3.6 3.5 4.0 4.3  CL 99 100 101 96  CO2 20 21 20  17*  GLUCOSE 98 101* 81 106*  BUN 16 17 15 19   CREATININE 1.04 1.12* 1.21* 1.54*  CALCIUM 10.7* 11.2* 11.4* 11.7*  MG -- 2.3 1.8 --  PHOS -- 2.0* 2.7 --   Liver Function Tests: No results found for this basename: AST:5,ALT:5,ALKPHOS:5,BILITOT:5,PROT:5,ALBUMIN:5 in the last 168 hours No results found for this basename: LIPASE:5,AMYLASE:5 in the last 168 hours No results found for this basename: AMMONIA:5 in the last 168 hours CBC:  Lab 04/09/12 0655 04/08/12 0420 04/07/12 0510 04/06/12 0420  WBC 9.5 10.0 8.7 11.0*  NEUTROABS -- -- -- --  HGB 9.7* 10.9* 10.3* 10.4*  HCT 27.9* 31.1* 30.0* 30.8*  MCV 89.7 89.1 91.7 91.9  PLT 174 167 169 199   Cardiac Enzymes: No results found for this basename: CKTOTAL:5,CKMB:5,CKMBINDEX:5,TROPONINI:5 in the last 168 hours BNP: BNP (last 3 results) No results found for this basename: PROBNP:3 in the last 8760 hours CBG: No results found for this basename: GLUCAP:5 in the last 168 hours  Time coordinating discharge: 50 minutes.  SignedKathlen Mody  [facility] 04/12/2012, 12:07 PM

## 2012-04-12 NOTE — Progress Notes (Signed)
Clinical Social Work  CSW reviewed chart which stated patient will admit to CIR at dc. CSW is signing off but available if needed.  [place], [place] [phone]

## 2012-04-12 NOTE — PMR Pre-admission (Signed)
PMR Admission Coordinator Pre-Admission Assessment  Patient: [name] is an 76 y.o., female MRN: [id number] DOB: 04/30/1936 Height: 5\' 7"  (170.2 cm) Weight: 79.6 kg (175 lb 7.8 oz)  Insurance Information HMO:      PPO:       PCP:       IPA:       80/20:       OTHER:   PRIMARY: Medicare A/B      Policy#: [id number]      Subscriber:  Dione Plover CM Name:        Phone#:       Fax#:   Pre-Cert#:        Employer: Retired Benefits:  Phone #:       Name: Armed forces training and education officer. Date: 10/29/02     Deduct: $1184      Out of Pocket Max: none      Life Max: unlimited CIR: 100%      SNF: 100 days   LBD = none Outpatient: 80%     Co-Pay: 20% Home Health: 100%      Co-Pay: none DME: 80%     Co-Pay: 20% Providers: patient's choice  SECONDARY: A[employer]      Policy#: [id number]      Subscriber: Dione Plover CM Name:        Phone#:       Fax#:   Pre-Cert#:        Employer: Retired Benefits:  Phone #: [phone]     Name:   Eff. Date:       Deduct:        Out of Pocket Max:        Life Max:   CIR:        SNF:   Outpatient:       Co-Pay:   Home Health:        Co-Pay:   DME:       Co-Pay:    Emergency Contact Information Contact Information    Name Relation Home Work Mobile   [place] Spouse [phone]   Chahal,Scott    [phone]     Current Medical History  Patient Admitting Diagnosis:  Multiple myeloma, SDH after fall  History of Present Illness:A 76 y.o. female with hx anxiety, HTN, DDD presented 7/8 with 3 month hx back pain, extremity weakness and urinary incontinence. She was admitted to [facility] On 7/08 by [facility] and workup revealed acute renal failure, hypercalcemia, proteinuria and MRI L spine showed innumerable lesions. Heme/Onc was consulted for workup for multiple myeloma. Pt was improving and unfortunately sustained an unwitnessed fall 7/10 with subsequent subdural hematoma. She was tx to [facility] for Dr. Gerlene Fee consulted for input and recommended  follow up CT for stability. She remained neurologically stable and did not require any surgical intervention. CT head on 07/11 with slight improvement. Bone biopsy done on 07/12 and results pending. continues on prednisone for ?MM. PT/OT re-evaluations done on 07/12 and CIR recommended for progression. Patient with complaints of vertigo and vestibular evaluation recommended.    Past Medical History  Past Medical History  Diagnosis Date  . Anxiety   . Hypertension   . Hyperlipidemia   . Depression   . Osteopenia   . Multiple myeloma 03/2012    Family History  family history includes Diabetes in her sisters; Heart disease in her mother; Hyperlipidemia in her sisters; and Hypertension in her sisters.  There is no history of Sudden death and Heart attack.  Prior Rehab/Hospitalizations: No previous rehab admissions.   Current Medications  Current facility-administered medications:0.9 %  sodium chloride infusion, , Intravenous, Continuous, Lonia Blood, MD, Last Rate: 50 mL/hr at 04/11/12 2106;  acetaminophen (TYLENOL) suppository 650 mg, 650 mg, Rectal, Q6H PRN, Therisa Doyne, MD;  acetaminophen (TYLENOL) tablet 650 mg, 650 mg, Oral, Q6H PRN, Therisa Doyne, MD, 650 mg at 04/10/12 1421 acyclovir (ZOVIRAX) 200 MG capsule 400 mg, 400 mg, Oral, Daily, Exie Parody, MD, 400 mg at 04/12/12 1050;  albuterol (PROVENTIL) (5 MG/ML) 0.5% nebulizer solution 2.5 mg, 2.5 mg, Nebulization, Q2H PRN, Therisa Doyne, MD;  ALPRAZolam Prudy Feeler) tablet 1 mg, 1 mg, Oral, QHS PRN, Therisa Doyne, MD, 1 mg at 04/08/12 2210;  atorvastatin (LIPITOR) tablet 20 mg, 20 mg, Oral, q1800, Therisa Doyne, MD, 20 mg at 04/11/12 1755 bisacodyl (DULCOLAX) suppository 10 mg, 10 mg, Rectal, Daily PRN, Russella Dar, NP, 10 mg at 04/09/12 0839;  chlorhexidine (PERIDEX) 0.12 % solution 15 mL, 15 mL, Mouth/Throat, BID, Charlynne Pander, DDS, 15 mL at 04/12/12 1050;  cyclobenzaprine (FLEXERIL) tablet 5 mg, 5 mg, Oral,  TID PRN, Therisa Doyne, MD;  dexamethasone (DECADRON) tablet 40 mg, 40 mg, Oral, Weekly, Exie Parody, MD docusate sodium (COLACE) capsule 100 mg, 100 mg, Oral, BID, Therisa Doyne, MD, 100 mg at 04/12/12 1050;  guaiFENesin-dextromethorphan (ROBITUSSIN DM) 100-10 MG/5ML syrup 5 mL, 5 mL, Oral, Q4H PRN, Therisa Doyne, MD;  magnesium hydroxide (MILK OF MAGNESIA) suspension 15 mL, 15 mL, Oral, Daily PRN, Russella Dar, NP, 15 mL at 04/09/12 0839;  menthol-cetylpyridinium (CEPACOL) lozenge 3 mg, 1 lozenge, Oral, PRN, Kathlen Mody, MD metoCLOPramide (REGLAN) tablet 5 mg, 5 mg, Oral, TID AC, Vassie Loll, MD, 5 mg at 04/12/12 0801;  neomycin-bacitracin-polymyxin (NEOSPORIN) ointment, , Topical, BID, Kathlen Mody, MD;  ondansetron (ZOFRAN) injection 4 mg, 4 mg, Intravenous, Q6H PRN, Therisa Doyne, MD, 4 mg at 04/05/12 1248;  ondansetron (ZOFRAN) tablet 4 mg, 4 mg, Oral, Q6H PRN, Therisa Doyne, MD oxyCODONE (Oxy IR/ROXICODONE) immediate release tablet 2.5 mg, 2.5 mg, Oral, Q4H PRN, Therisa Doyne, MD, 2.5 mg at 04/09/12 2259;  oxyCODONE-acetaminophen (PERCOCET) 5-325 MG per tablet 1 tablet, 1 tablet, Oral, Q4H PRN, Therisa Doyne, MD, 1 tablet at 04/11/12 2057;  pamidronate (AREDIA) 60 mg in sodium chloride 0.9 % 500 mL IVPB, 60 mg, Intravenous, Once, Exie Parody, MD, 60 mg at 04/12/12 0131 polyethylene glycol (MIRALAX / GLYCOLAX) packet 17 g, 17 g, Oral, QHS, Russella Dar, NP, 17 g at 04/11/12 2152;  predniSONE (DELTASONE) tablet 20 mg, 20 mg, Oral, Q breakfast, Therisa Doyne, MD, 20 mg at 04/12/12 0801;  tetrahydrozoline 0.05 % ophthalmic solution 1 drop, 1 drop, Both Eyes, BID, Kathlen Mody, MD, 1 drop at 04/11/12 2152;  traMADol (ULTRAM) tablet 25 mg, 25 mg, Oral, QHS PRN, Therisa Doyne, MD DISCONTD: calcitonin (MIACALCIN) injection 328 Units, 4 Units/kg, Intramuscular, Q12H, Therisa Doyne, MD, 328 Units at 04/11/12 1100  Patients Current Diet:  Cardiac  Precautions / Restrictions Precautions Precautions: Fall Restrictions Weight Bearing Restrictions: No   Prior Activity Level Limited Community (1-2x/wk): Went out 3-4 X a week.  Home Assistive Devices / Equipment Home Assistive Devices/Equipment: None Home Adaptive Equipment: Wheelchair - manual  Prior Functional Level Prior Function Level of Independence: Needs assistance Needs Assistance: Bathing;Dressing;Toileting;Meal Prep;Light Housekeeping;Gait Bath: Moderate Dressing: Moderate Toileting: Minimal Meal Prep: Total Light Housekeeping: Total Gait Assistance: hand held assist by husband 24/7 for the last week or so  Current Functional Level Cognition  Arousal/Alertness: Awake/alert Overall Cognitive Status: Impaired Current Attention Level: Selective Orientation Level: Oriented X4 Safety/Judgement: Decreased awareness of safety precautions;Decreased safety judgement for tasks assessed Safety/Judgement - Other Comments: Pt needs mod instructional cueing to utilize walker correctly and perform sit to stand safely. Awareness of Errors: Assistance required to identify errors made;Assistance required to correct errors made Cognition - Other Comments: Improved from yesterday's session.    Extremity Assessment (includes Sensation/Coordination)  RUE ROM/Strength/Tone: WFL for tasks assessed  RLE ROM/Strength/Tone: Deficits RLE ROM/Strength/Tone Deficits: grossly 3+/5 per observation    ADLs  Eating/Feeding: Simulated;Independent Where Assessed - Eating/Feeding: Edge of bed Grooming: Performed;Wash/dry face;Teeth care;Brushing hair;Supervision/safety Where Assessed - Grooming: Unsupported standing Upper Body Bathing: Performed;Set up Where Assessed - Upper Body Bathing: Unsupported sitting Lower Body Bathing: Minimal assistance;Moderate assistance Where Assessed - Lower Body Bathing: Supported sit to stand Upper Body Dressing: Performed;Set up Where Assessed -  Upper Body Dressing: Unsupported sitting Lower Body Dressing: Performed;Maximal assistance (to don/doff socks. Pt with increased back pain today.) Where Assessed - Lower Body Dressing: Unsupported sitting Toilet Transfer: Performed;Min guard Toilet Transfer Method: Sit to Barista: Regular height toilet Toileting - Clothing Manipulation and Hygiene: Performed;Min guard Where Assessed - Engineer, mining and Hygiene: Sit on 3-in-1 or toilet Equipment Used: Rolling walker Transfers/Ambulation Related to ADLs: Ambulated to the bathroom with minguard A and supervision. Less directional cueing needed to day manipulating RW. ADL Comments: Pt required A to bathe lower legs and feet, don socks. Max cues for thoroughness with bath. Pt had tendency to wash the same body parts over and over.    Mobility  Bed Mobility: Rolling Right;Right Sidelying to Sit Rolling Right: 5: Supervision Right Sidelying to Sit: 4: Min guard;HOB flat;With rails Supine to Sit: 4: Min guard;HOB elevated;With rails Sitting - Scoot to Edge of Bed: 4: Min guard Sit to Supine: 3: Mod assist    Transfers  Transfers: Sit to Stand;Stand to Sit Sit to Stand: 4: Min guard;With upper extremity assist;From bed;From toilet Stand to Sit: 4: Min guard;With upper extremity assist;To chair/3-in-1;To toilet;With armrests    Ambulation / Gait / Stairs / Wheelchair Mobility  Ambulation/Gait Ambulation/Gait Assistance: 4: Min Environmental consultant (Feet): 50 Feet Assistive device: Rolling walker Ambulation/Gait Assistance Details: slow shuffled gait, distracted and tends to stagger when distracted; cues for safe maniupulation of RW, did abandon RW x1 after washing hands at sink needing cue to bring with her; cues for safe use of RW especially with turns Gait Pattern: Trunk flexed;Shuffle Gait velocity: decreased    Posture / Balance Static Standing Balance Static Standing - Balance Support: No  upper extremity supported;During functional activity Static Standing - Level of Assistance: 5: Stand by assistance Dynamic Standing Balance Dynamic Standing - Balance Support: Right upper extremity supported Dynamic Standing - Level of Assistance: 3: Mod assist Dynamic Standing - Comments: facilitation for stability during AP and lateral weight shifting     Previous Home Environment Living Arrangements: Spouse/significant other Lives With: Spouse Available Help at Discharge: Family Type of Home: Independent living facility (senior apartments) Firefighter: Handicapped height (grab bars both sides) Home Care Services: No  Discharge Living Setting Plans for Discharge Living Setting: Patient's home;Lives with (comment);Apartment (Lives with husband.) Type of Home at Discharge: Apartment Discharge Home Layout: One level Discharge Home Access: Level entry Do you have any problems obtaining your medications?: No  Social/Family/Support Systems Patient Roles: Spouse;Parent Contact Information: [place] Warning - husband Anticipated Caregiver: Husband Anticipated Caregiver's Contact Information: Billy (h)  [phone] (c) [phone] Ability/Limitations of Caregiver: Husband is retired Medical laboratory scientific officer: 24/7 Discharge Plan Discussed with Primary Caregiver: Yes Is Caregiver In Agreement with Plan?: Yes Does Caregiver/Family have Issues with Lodging/Transportation while Pt is in Rehab?: No Has a son locally who visits daily.  Goals/Additional Needs Patient/Family Goal for Rehab: PT mod I, OT mod I/S and ST mod I/S goals Expected length of stay: 7-10 days Cultural Considerations: None Dietary Needs: Heart diet Equipment Needs: TBD Pt/Family Agrees to Admission and willing to participate: Yes Program Orientation Provided & Reviewed with Pt/Caregiver Including Roles  & Responsibilities: Yes  Patient Condition: This patient's condition remains as documented in the Consult dated 04/11/12, in  which the Rehabilitation Physician determined and documented that the patient's condition is appropriate for intensive rehabilitative care in an inpatient rehabilitation facility.  Preadmission Screen Completed By:  Trish Mage, 04/12/2012 11:24 AM ______________________________________________________________________   Discussed status with Dr. Riley Kill on at 04/12/12 and received telephone approval for admission today.  Admission Coordinator:  Trish Mage, time1132/Date07/16/13

## 2012-04-12 NOTE — Progress Notes (Signed)
Patient discharged to inpatient rehab. Alert and oriented, not in any distress.VSS .

## 2012-04-12 NOTE — Progress Notes (Signed)
Seen and Agreed 04/12/2012 Fredrich Birks PTA [phone] pager [phone] office

## 2012-04-12 NOTE — H&P (Signed)
Physical Medicine and Rehabilitation Admission H&P  Chief Complaint   Patient presents with   .  Back Pain, hip pain, fall   .  Weakness   :  HPI: [name] is a 76 y.o. female with hx anxiety, HTN, DDD presented 7/8 with 3 month hx back pain, extremity weakness and urinary incontinence. She was admitted to [facility] On 7/08 by [facility] and workup revealed acute renal failure, hypercalcemia, proteinuria and MRI L spine showed innumerable lesions. Heme/Onc was consulted for workup for multiple myeloma. Pt was improving and unfortunately sustained an unwitnessed fall 7/10 with subsequent subdural hematoma. She was tx to [facility] for Dr. Gerlene Fee consulted for input and recommended follow up CT for stability. Started on steroids by hospitalitis for cerebral edema. She remained neurologically stable and did not require any surgical intervention. CT head on 07/11 with slight improvement. Pathology positive for plasma cytoma and patient started on treatment for myeloma on 07/16. Treated with dose of aredia on 7/15.  Review of Systems  Constitutional: Positive for malaise/fatigue.  HENT: Negative for hearing loss.  Eyes: Negative for blurred vision and double vision.  Respiratory: Negative for cough and shortness of breath.  Cardiovascular: Negative for chest pain and palpitations.  Gastrointestinal: Positive for constipation (chronic).  Genitourinary: Positive for frequency.  Musculoskeletal: Positive for myalgias, back pain and joint pain (bilateral hip pain).  Neurological: Positive for dizziness (improving.) and headaches.  Psychiatric/Behavioral: Negative for depression. The patient is not nervous/anxious.   Past Medical History   Diagnosis  Date   .  Anxiety    .  Hypertension    .  Hyperlipidemia    .  Depression    .  Osteopenia    .  Multiple myeloma  03/2012    Past Surgical History   Procedure  Date   .  Breast lumpectomy     Family History   Problem  Relation  Age of  Onset   .  Heart disease  Mother    .  Hyperlipidemia  Sister    .  Hypertension  Sister    .  Diabetes  Sister    .  Diabetes  Sister    .  Hyperlipidemia  Sister    .  Hypertension  Sister    .  Sudden death  Neg Hx    .  Heart attack  Neg Hx     Social History: Married. Did work out of home for a few years. She reports that she has never smoked. She has never used smokeless tobacco. She reports that she does not drink alcohol or use illicit drugs. Has had problems with back past 6 months with problems walking-worse in the last 4 weeks. Husband supportive and can assist past discharge.  Allergies   Allergen  Reactions   .  Simvastatin  Other (See Comments)     Joint aches    Scheduled Meds:  .  acyclovir  400 mg  Oral  Daily   .  atorvastatin  20 mg  Oral  q1800   .  chlorhexidine  15 mL  Mouth/Throat  BID   .  dexamethasone  40 mg  Oral  Weekly   .  docusate sodium  100 mg  Oral  BID   .  metoCLOPramide  5 mg  Oral  TID AC   .  neomycin-bacitracin-polymyxin   Topical  BID   .  pamidronate  60 mg  Intravenous  Once   .  polyethylene glycol  17 g  Oral  QHS   .  predniSONE  20 mg  Oral  Q breakfast   .  tetrahydrozoline  1 drop  Both Eyes  BID   .  DISCONTD: calcitonin  4 Units/kg  Intramuscular  Q12H    Medications Prior to Admission   Medication  Sig  Dispense  Refill   .  ALPRAZolam (XANAX) 1 MG tablet  Take 1 mg by mouth at bedtime as needed. Sleep.     .  butalbital-aspirin-caffeine (FIORINAL) 50-325-40 MG per capsule  Take 1 capsule by mouth 2 (two) times daily as needed. Headache.     .  cyclobenzaprine (FLEXERIL) 5 MG tablet  Take 5 mg by mouth 3 (three) times daily as needed. Muscle spasms.     .  meloxicam (MOBIC) 15 MG tablet  Take 15 mg by mouth daily.     .  Olmesartan-Amlodipine-HCTZ (TRIBENZOR) 40-10-12.5 MG TABS  Take 1 tablet by mouth daily.     Marland Kitchen  oxyCODONE-acetaminophen (PERCOCET) 7.5-325 MG per tablet  Take 1 tablet by mouth every 4 (four) hours as needed.  Every 4 to 6 hours as needed for pain.     Marland Kitchen  QUEtiapine (SEROQUEL) 100 MG tablet  Take 100 mg by mouth at bedtime.     .  rosuvastatin (CRESTOR) 10 MG tablet  Take 10 mg by mouth daily.     .  traMADol (ULTRAM) 50 MG tablet  Take 25 mg by mouth at bedtime as needed. Pain.     .  predniSONE (STERAPRED UNI-PAK) 10 MG tablet  6 tabs po day 1, 5 tabs po day 2, 4 tabs po day 3, 3 tabs po day 4, 2 tabs po day 5, 1 tab po day 6  21 tablet  0    Home:  Home Living  Lives With: Spouse  Available Help at Discharge: Family  Type of Home: Independent living facility (senior apartments)  Firefighter: Handicapped height (grab bars both sides)  Home Adaptive Equipment: Wheelchair - manual  Functional History:  Prior Function  Bath: Moderate  Toileting: Minimal  Dressing: Moderate  Meal Prep: Total  Light Housekeeping: Total  Functional Status:  Mobility:  Bed Mobility  Bed Mobility: Rolling Right;Right Sidelying to Sit  Rolling Right: 5: Supervision  Right Sidelying to Sit: 4: Min guard;HOB flat;With rails  Supine to Sit: 4: Min guard;HOB elevated;With rails  Sitting - Scoot to Edge of Bed: 4: Min guard  Sit to Supine: 3: Mod assist  Transfers  Transfers: Sit to Stand;Stand to Sit  Sit to Stand: 4: Min guard;With upper extremity assist;From bed;From toilet  Stand to Sit: 4: Min guard;With upper extremity assist;To chair/3-in-1;To toilet;With armrests  Ambulation/Gait  Ambulation/Gait Assistance: 4: Min assist  Ambulation Distance (Feet): 50 Feet  Assistive device: Rolling walker  Ambulation/Gait Assistance Details: slow shuffled gait, distracted and tends to stagger when distracted; cues for safe maniupulation of RW, did abandon RW x1 after washing hands at sink needing cue to bring with her; cues for safe use of RW especially with turns  Gait Pattern: Trunk flexed;Shuffle  Gait velocity: decreased   ADL:  ADL  Eating/Feeding: Simulated;Independent  Where Assessed - Eating/Feeding:  Edge of bed  Grooming: Performed;Wash/dry face;Teeth care;Brushing hair;Supervision/safety  Where Assessed - Grooming: Unsupported standing  Upper Body Bathing: Performed;Set up  Where Assessed - Upper Body Bathing: Unsupported sitting  Lower Body Bathing: Minimal assistance;Moderate assistance  Where Assessed - Lower Body Bathing: Supported  sit to stand  Upper Body Dressing: Performed;Set up  Where Assessed - Upper Body Dressing: Unsupported sitting  Lower Body Dressing: Performed;Maximal assistance (to don/doff socks. Pt with increased back pain today.)  Where Assessed - Lower Body Dressing: Unsupported sitting  Toilet Transfer: Performed;Min guard  Toilet Transfer Method: Sit to Production manager: Regular height toilet  Equipment Used: Rolling walker  Transfers/Ambulation Related to ADLs: Ambulated to the bathroom with minguard A and supervision. Less directional cueing needed to day manipulating RW.  ADL Comments: Pt required A to bathe lower legs and feet, don socks. Max cues for thoroughness with bath. Pt had tendency to wash the same body parts over and over.  Cognition:  Cognition  Arousal/Alertness: Awake/alert  Orientation Level: Oriented X4  Cognition  Overall Cognitive Status: Impaired  Area of Impairment: Attention;Safety/judgement;Awareness of errors  Arousal/Alertness: Awake/alert  Orientation Level: Appears intact for tasks assessed  Behavior During Session: [facility] for tasks performed  Current Attention Level: Selective  Safety/Judgement: Decreased awareness of safety precautions;Decreased safety judgement for tasks assessed  Safety/Judgement - Other Comments: Pt needs mod instructional cueing to utilize walker correctly and perform sit to stand safely.  Awareness of Errors: Assistance required to identify errors made;Assistance required to correct errors made  Cognition - Other Comments: Improved from yesterday's session.  Blood pressure 131/94, pulse 94,  temperature 98.7 F (37.1 C), temperature source Oral, resp. rate 16, height 5\' 7"  (1.702 m), weight 79.6 kg (175 lb 7.8 oz), SpO2 93.00%.  Physical Exam  Nursing note and vitals reviewed.  Constitutional: She is oriented to person, place, and time. She appears well-developed and well-nourished.  HENT:  Head: Normocephalic and atraumatic.  Eyes: Pupils are equal, round, and reactive to light.  Neck: Normal range of motion. Neck supple.  Cardiovascular: Normal rate and regular rhythm.  Pulmonary/Chest: Effort normal and breath sounds normal.  Abdominal: Soft. Bowel sounds are normal. She exhibits no distension. There is no tenderness.  Musculoskeletal: She exhibits no edema.  Left great toe with bruising.  Neurological: She is alert and oriented to person, place, and time.  Speech clear. Able to follow 2 step commands with occasional cueing. No nystagmus noted. Decrease in fine motor movement LLE.  Psychiatric: She has a normal mood and affect. Her behavior is normal.   No results found for this or any previous visit (from the past 48 hour(s)).  No results found.  Post Admission Physician Evaluation:  1. Functional deficits secondary to multiple myeloma and fall with SDH2. 2. Patient is admitted to receive collaborative, interdisciplinary care between the physiatrist, rehab nursing staff, and therapy team. 3. Patient's level of medical complexity and substantial therapy needs in context of that medical necessity cannot be provided at a lesser intensity of care such as a SNF. 4. Patient has experienced substantial functional loss from his/her baseline which was documented above under the "Functional History" and "Functional Status" headings. Judging by the patient's diagnosis, physical exam, and functional history, the patient has potential for functional progress which will result in measurable gains while on inpatient rehab. These gains will be of substantial and practical use upon discharge  in facilitating mobility and self-care at the household level. 5. Physiatrist will provide 24 hour management of medical needs as well as oversight of the therapy plan/treatment and provide guidance as appropriate regarding the interaction of the two. 6. 24 hour rehab nursing will assist with bladder management, bowel management, safety, skin/wound care, disease management, medication administration, pain management and  patient education and help integrate therapy concepts, techniques,education, etc. 7. PT will assess and treat for: fxnl mobility, safety, NMR, adaptive equipment, balance. Goals are: mod I to supervision. 8. OT will assess and treat for: UES, NMR, ADL's, safety, fxnl mobility. Goals are: mod I to supervision. 9. SLP will assess and treat for: cognition. Goals are: mod I. 10. Case Management and Social Worker will assess and treat for psychological issues and discharge planning. 11. Team conference will be held weekly to assess progress toward goals and to determine barriers to discharge. 12. Patient will receive at least 3 hours of therapy per day at least 5 days per week. 13. ELOS: one week plus Prognosis: excellent Medical Problem List and Plan:  1. DVT Prophylaxis/Anticoagulation: Mechanical: Sequential compression devices, below knee Bilateral lower extremities  2. Pain Management: Pressure relief measures in conjunction with prn oxycodone effective. Her low back remains a source of pain at night and with activity. 3. Mood: Looks better today. Dealing with her diagnosis realistically. Will have LCSW to follow up for formal evaluation.  4. Neuropsych: This patient is capable of making decisions on his/her own behalf.  5. Multiple Myeloma: Velcade on 3 wks then 1 week off with dexamethasone 40 mg/wk. Acyclovir to decrease risk for viral infection. Patient to follow up with Dr. Gaylyn Rong for initiation of Revlimid at later date.  6. Acute renal failure: resolved with hydration. Recheck in  am.  7. Chronic constipation: Took 4 stool softeners daily with MOM twice a week. Will try miralax twice a day. Suppository prn   Ivory Broad, MD

## 2012-04-12 NOTE — Progress Notes (Signed)
Physical Therapy Treatment Patient Details Name: [name] MRN: [id number] DOB: [birth year] Today's Date: 04/12/2012 Time: 1150-1208 PT Time Calculation (min): 18 min  PT Assessment / Plan / Recommendation Comments on Treatment Session  Pt was in pain and had just taken pain meds.  Pt was concerned about dizziness (because just took meds) and did not want to ambulate longer distance.   Continue with POC.    Follow Up Recommendations       Barriers to Discharge        Equipment Recommendations  Defer to next venue    Recommendations for Other Services    Frequency Min 3X/week   Plan Discharge plan remains appropriate;Frequency remains appropriate    Precautions / Restrictions Precautions Precautions: Fall Restrictions Weight Bearing Restrictions: No   Pertinent Vitals/Pain 8/10 back pain    Mobility  Bed Mobility Bed Mobility: Not assessed Rolling Right: 5: Supervision Right Sidelying to Sit: 4: Min guard;HOB flat;With rails Details for Bed Mobility Assistance: Pt c/o no dizziness and required no physical A. VCs to sequence. Transfers Sit to Stand: 4: Min guard;With upper extremity assist;From toilet Stand to Sit: 4: Min guard;To chair/3-in-1;With upper extremity assist;With armrests Details for Transfer Assistance: Pt required A for safety and balance Ambulation/Gait Ambulation/Gait Assistance: 4: Min assist Ambulation Distance (Feet): 50 Feet Assistive device: Rolling walker Ambulation/Gait Assistance Details: Pt required assistance for safety and balance.  Pt required some direction with RW to avoid obstacles. Gait Pattern: Trunk flexed;Shuffle;Step-through pattern;Decreased stride length;Decreased trunk rotation Gait velocity: decreased    Exercises     PT Diagnosis:    PT Problem List:   PT Treatment Interventions:     PT Goals Acute Rehab PT Goals PT Goal: Supine/Side to Sit - Progress: Progressing toward goal PT Goal: Sit to Stand - Progress:  Progressing toward goal PT Goal: Stand to Sit - Progress: Progressing toward goal PT Goal: Ambulate - Progress: Progressing toward goal  Visit Information  Last PT Received On: 04/12/12 Assistance Needed: +1    Subjective Data  Subjective: "I never thought I would have bone cancer."   Cognition  Overall Cognitive Status: Appears within functional limits for tasks assessed/performed Area of Impairment: Attention;Safety/judgement;Awareness of errors Arousal/Alertness: Awake/alert Orientation Level: Appears intact for tasks assessed Behavior During Session: [facility] for tasks performed Current Attention Level: Selective Safety/Judgement: Decreased awareness of safety precautions;Decreased safety judgement for tasks assessed Awareness of Errors: Assistance required to identify errors made;Assistance required to correct errors made Cognition - Other Comments: Improved from yesterday's session.    Balance  Static Standing Balance Static Standing - Balance Support: No upper extremity supported;During functional activity Static Standing - Level of Assistance: 5: Stand by assistance  End of Session PT - End of Session Equipment Utilized During Treatment: Gait belt Activity Tolerance: Patient limited by pain Patient left: in chair;with call bell/phone within reach;with family/visitor present Nurse Communication: Mobility status   GP     Cannan Beeck, SPTA 04/12/2012, 1:14 PM

## 2012-04-12 NOTE — Plan of Care (Addendum)
Overall Plan of Care [facility]) Patient Details Name: [name] MRN: [id number] DOB: 10-22-1935  Diagnosis:  SDH, Multiple myeloma  Primary Diagnosis:    SDH (subdural hematoma) Co-morbidities: anemia, pain  Functional Problem List  Patient demonstrates impairments in the following areas: Balance, Bladder, Bowel, Cognition, Endurance, Medication Management, Motor, Pain, Safety and Skin Integrity  Basic ADL's: grooming, bathing, dressing and toileting Advanced ADL's: simple meal preparation  Transfers:  bed mobility, bed to chair, toilet, tub/shower, car and furniture Locomotion:  ambulation  Additional Impairments:  Functional use of upper extremity and Social Cognition   social interaction, problem solving, memory, attention and awareness  Anticipated Outcomes Item Anticipated Outcome  Eating/Swallowing    Basic self-care  Mod I to supervision  Tolieting  Mod I  Bowel/Bladder  Moderate  assist  Transfers  Mod I  Locomotion  supervision  Communication    Cognition  Supervision  Pain  Less than 2  Safety/Judgment  Supervision  Skin  Free of breakdown or  infection   Therapy Plan: PT Frequency: 1-2 X/day, 60-90 minutes OT Frequency: 1-2 X/day, 60-90 minutes SLP Frequency: 1-2 X/day, 30-60 minutes   Team Interventions: Item RN PT OT SLP SW TR Other  Self Care/Advanced ADL Retraining   x      Neuromuscular Re-Education  x x      Therapeutic Activities  x x x  x   UE/LE Strength Training/ROM  x x   x   UE/LE Coordination Activities  x x   x   Visual/Perceptual Remediation/Compensation   x      DME/Adaptive Equipment Instruction  x x   x   Therapeutic Exercise  x x   x   Balance/Vestibular Training  x x   x   Patient/Family Education  x x x  x   Cognitive Remediation/Compensation  x x x  x   Functional Mobility Training  x x      Ambulation/Gait Training  x       Stair Training  x       Wheelchair Propulsion/Positioning  x       Functional Technical brewer Reintegration  x x   x   Dysphagia/Aspiration Film/video editor         Bladder Management x  x      Bowel Management x        Disease Management/Prevention         Pain Management x x x      Medication Management x        Skin Care/Wound Management x        Splinting/Orthotics   x      Discharge Planning x x x x x x   Psychosocial Support   x x x x                      Team Discharge Planning: Destination:  Home Projected Follow-up:  PT, OT, SLP and Home Health Projected Equipment Needs:  Shower Seat Patient/family involved in discharge planning:  Yes  MD ELOS: 10-14 days Medical Rehab Prognosis:  Excellent Assessment: pt admitted for cir therapies. The team will be addressing balance, fxnl mobility, safety, adaptive equipoment and techniques, pain mgt. Goals overall are mod I to supervision. She has a good social support system.

## 2012-04-12 NOTE — Progress Notes (Signed)
Rehab admissions - Evaluated for possible admission.  I spoke with patient and husband at the bedside.  Gave them rehab booklets and explained inpatient rehab. Both are in agreement to inpatient rehab admission.  Bed available and can admit today.  I have also talked with Dr. Blake Divine to assure patient readiness for admit to inpatient rehab today.  Call me for questions.  #[phone]

## 2012-04-12 NOTE — H&P (Signed)
Report given to 4000. 

## 2012-04-13 ENCOUNTER — Inpatient Hospital Stay (HOSPITAL_COMMUNITY): Payer: Medicare Other | Admitting: Speech Pathology

## 2012-04-13 ENCOUNTER — Inpatient Hospital Stay (HOSPITAL_COMMUNITY): Payer: Medicare Other | Admitting: Occupational Therapy

## 2012-04-13 ENCOUNTER — Inpatient Hospital Stay (HOSPITAL_COMMUNITY): Payer: Medicare Other | Admitting: Physical Therapy

## 2012-04-13 ENCOUNTER — Encounter: Payer: Self-pay | Admitting: Oncology

## 2012-04-13 ENCOUNTER — Telehealth: Payer: Self-pay | Admitting: *Deleted

## 2012-04-13 DIAGNOSIS — S069X9A Unspecified intracranial injury with loss of consciousness of unspecified duration, initial encounter: Secondary | ICD-10-CM

## 2012-04-13 DIAGNOSIS — C9000 Multiple myeloma not having achieved remission: Secondary | ICD-10-CM

## 2012-04-13 DIAGNOSIS — Z5189 Encounter for other specified aftercare: Secondary | ICD-10-CM

## 2012-04-13 DIAGNOSIS — S065X9A Traumatic subdural hemorrhage with loss of consciousness of unspecified duration, initial encounter: Secondary | ICD-10-CM

## 2012-04-13 DIAGNOSIS — W19XXXA Unspecified fall, initial encounter: Secondary | ICD-10-CM

## 2012-04-13 LAB — DIFFERENTIAL
Band Neutrophils: 0 % (ref 0–10)
Eosinophils Absolute: 0.1 10*3/uL (ref 0.0–0.7)
Eosinophils Relative: 1 % (ref 0–5)
Lymphocytes Relative: 30 % (ref 12–46)
Lymphs Abs: 3.4 10*3/uL (ref 0.7–4.0)
Metamyelocytes Relative: 0 %
Monocytes Absolute: 0.3 10*3/uL (ref 0.1–1.0)
Monocytes Relative: 3 % (ref 3–12)

## 2012-04-13 LAB — COMPREHENSIVE METABOLIC PANEL
ALT: 9 U/L (ref 0–35)
AST: 23 U/L (ref 0–37)
Albumin: 2.4 g/dL — ABNORMAL LOW (ref 3.5–5.2)
CO2: 20 mEq/L (ref 19–32)
Calcium: 10.1 mg/dL (ref 8.4–10.5)
Sodium: 137 mEq/L (ref 135–145)
Total Protein: 9.4 g/dL — ABNORMAL HIGH (ref 6.0–8.3)

## 2012-04-13 LAB — CBC
MCH: 30.9 pg (ref 26.0–34.0)
MCHC: 34.1 g/dL (ref 30.0–36.0)
Platelets: 181 10*3/uL (ref 150–400)
RBC: 2.75 MIL/uL — ABNORMAL LOW (ref 3.87–5.11)
RDW: 13.6 % (ref 11.5–15.5)

## 2012-04-13 LAB — PATHOLOGIST SMEAR REVIEW

## 2012-04-13 NOTE — Evaluation (Signed)
Occupational Therapy Assessment and Plan  Patient Details  Name: [name] MRN: [id number] Date of Birth: 26-Aug-1936  OT Diagnosis: cognitive deficits, lumbago (low back pain) and muscle weakness (generalized) Rehab Potential: Rehab Potential: Good ELOS: 10-14 days   Today's Date: 04/13/2012 Time: 0730-0830 Time Calculation (min): 60 min  Problem List:  Patient Active Problem List  Diagnosis  . HYPERLIPIDEMIA NEC/NOS  . ANXIETY  . DEPRESSION  . HYPERTENSION  . BREAST MASS, RIGHT  . BACK PAIN, LUMBAR  . OSTEOPENIA  . INSOMNIA  . HYPERGLYCEMIA  . POSTMENOPAUSAL STATUS  . Obesity  . Dehydration  . Acute renal failure  . Hypercalcemia  . Hip pain  . Anemia  . Acute subdural hematoma  . Multiple myeloma  . Lytic lesion of bone on x-ray  . Constipation due to slow transit/hypercalcemia  . Plasmacytoma of bone-right seventh rib    Past Medical History:  Past Medical History  Diagnosis Date  . Anxiety   . Hypertension   . Hyperlipidemia   . Depression   . Osteopenia   . Multiple myeloma 03/2012   Past Surgical History:  Past Surgical History  Procedure Date  . Breast lumpectomy     Assessment & Plan Clinical Impression: Patient is a 76 y.o. year old female female with hx anxiety, HTN, DDD presented 7/8 with 3 month hx back pain, extremity weakness and urinary incontinence. She was admitted to [facility] On 7/08 by [facility] and workup revealed acute renal failure, hypercalcemia, proteinuria and MRI L spine showed innumerable lesions. Heme/Onc was consulted for workup for multiple myeloma. Pt was improving and unfortunately sustained an unwitnessed fall 7/10 with subsequent subdural hematoma. She was tx to [facility] for Dr. Gerlene Fee consulted for input and recommended follow up CT for stability. Started on steroids by hospitalitis for cerebral edema. She remained neurologically stable and did not require any surgical intervention. CT head on 07/11 with slight  improvement. Pathology positive for plasma cytoma and patient started on treatment for myeloma on 07/16. Treated with dose of aredia on 7/15.   Patient transferred to CIR on 04/12/2012 .    Patient currently requires min with basic self-care skills and min to mod A for functional mobility secondary to muscle weakness and lower back pain with mobility, bladder urgency, decreased cardiorespiratoy endurance, decreased coordination, decreased smoothness of eye movements, decreased standing balance, decreased attention, decreased awareness, decreased problem solving, decreased safety awareness, decreased memory and delayed processing and decreased standing balance, decreased postural control and decreased balance strategies.  Prior to hospitalization, patient could complete ADL with indepedence.  Patient will benefit from skilled intervention to increase independence with basic self-care skills prior to discharge home with care partner.  Anticipate patient will require 24 hour supervision and A with IADLS and follow up home health.  OT - End of Session Activity Tolerance: Tolerates 10 - 20 min activity with multiple rests Endurance Deficit: Yes OT Assessment Rehab Potential: Good OT Plan OT Frequency: 1-2 X/day, 60-90 minutes Estimated Length of Stay: 10-14 days OT Treatment/Interventions: Balance/vestibular training;Functional mobility training;Self Care/advanced ADL retraining;Therapeutic Exercise;UE/LE Strength taining/ROM;Neuromuscular re-education;DME/adaptive equipment instruction;Cognitive remediation/compensation;Community reintegration;Discharge planning;Patient/family education;Psychosocial support;Therapeutic Activities;Pain management;UE/LE Coordination activities;Visual/perceptual remediation/compensation OT Recommendation Follow Up Recommendations: Home health OT  OT Evaluation Precautions/Restrictions  Precautions Precautions: Fall Precaution Comments: no closed toe shoe on left foot-  wound on greater left toe, has a special boot Restrictions Weight Bearing Restrictions: No General Chart Reviewed: Yes Family/Caregiver Present: No Vital Signs Therapy Vitals Temp: 97.9 F (36.6  C) Temp src: Oral Pulse Rate: 98  (with activity) Resp: 19  BP: 139/81 mmHg Patient Position, if appropriate: Lying Oxygen Therapy SpO2: 96 % O2 Device: None (Room air) Pain Pain Assessment Pain Assessment: 0-10 Pain Score:   3 Pain Type: Chronic pain Pain Location: Back Pain Descriptors: Aching Pain Intervention(s): Repositioned;Rest Home Living/Prior Functioning Home Living Lives With: Spouse Available Help at Discharge: Family Type of Home: Independent living facility Home Access: Level entry Home Layout: One level Bathroom Shower/Tub: Walk-in shower;Tub/shower unit Bathroom Toilet: Handicapped height Bathroom Accessibility: Yes Home Adaptive Equipment: Tub transfer bench Prior Function Level of Independence: Independent with basic ADLs;Independent with gait;Independent with transfers Able to Take Stairs?: Yes Driving: Yes ADL  see FIM Vision/Perception  Vision - History Baseline Vision: Wears glasses only for reading Patient Visual Report: No change from baseline Vision - Assessment Vision Assessment: Vision tested Tracking/Visual Pursuits: Requires cues, head turns, or add eye shifts to track;Decreased smoothness of vertical tracking;Decreased smoothness of horizontal tracking Saccades: Decreased speed of saccadic movement Perception Perception: Within Functional Limits Praxis Praxis: Intact  Cognition Overall Cognitive Status: Impaired Arousal/Alertness: Awake/alert Orientation Level: Oriented X4 Attention: Selective Selective Attention: Impaired Selective Attention Impairment: Verbal basic;Functional basic Alternating Attention: Impaired Alternating Attention Impairment: Verbal basic;Functional basic Memory: Impaired Memory Impairment: Decreased short  term memory Decreased Short Term Memory: Verbal basic;Functional basic Awareness: Impaired Awareness Impairment: Emergent impairment Problem Solving: Impaired Problem Solving Impairment: Verbal basic;Functional complex Executive Function: Decision Making;Organizing;Sequencing Sequencing: Impaired Sequencing Impairment: Verbal basic;Functional basic Organizing: Impaired Organizing Impairment: Verbal basic;Functional basic Decision Making: Impaired Decision Making Impairment: Verbal basic;Functional basic Behaviors: Impulsive Safety/Judgment: Impaired Comments: decreased safety awareness Rancho Mirant Scales of Cognitive Functioning: Purposeful/appropriate Sensation Sensation Light Touch: Appears Intact Stereognosis: Appears Intact Hot/Cold: Appears Intact Proprioception: Appears Intact Coordination Gross Motor Movements are Fluid and Coordinated: No Fine Motor Movements are Fluid and Coordinated: No Coordination and Movement Description: decreased FMC Finger Nose Finger Test: decreased coordination and timing on the left Motor  Motor Motor: Within Functional Limits Motor - Skilled Clinical Observations: generalized weakness Mobility  Bed Mobility Supine to Sit: 5: Supervision (increased time) Supine to Sit Details (indicate cue type and reason): cues for technique Transfers Sit to Stand: 4: Min assist Stand to Sit: 4: Min assist  Trunk/Postural Assessment  Cervical Assessment Cervical Assessment: Within Functional Limits Thoracic Assessment Thoracic Assessment: Within Functional Limits Lumbar Assessment Lumbar Assessment:  (increased lordosis) Postural Control Postural Control:  (generalized weakness)  Balance Standardized Balance Assessment Standardized Balance Assessment: Berg Balance Test Berg Balance Test Sit to Stand: Able to stand  independently using hands Standing Unsupported: Able to stand 2 minutes with supervision Sitting with Back Unsupported but  Feet Supported on Floor or Stool: Able to sit safely and securely 2 minutes Stand to Sit: Controls descent by using hands Transfers: Able to transfer with verbal cueing and /or supervision Standing Unsupported with Eyes Closed: Able to stand 10 seconds with supervision Standing Ubsupported with Feet Together: Needs help to attain position but able to stand for 30 seconds with feet together From Standing, Reach Forward with Outstretched Arm: Can reach forward >12 cm safely (5") From Standing Position, Pick up Object from Floor: Unable to pick up shoe, but reaches 2-5 cm (1-2") from shoe and balances independently From Standing Position, Turn to Look Behind Over each Shoulder: Looks behind one side only/other side shows less weight shift Turn 360 Degrees: Needs close supervision or verbal cueing Standing Unsupported, Alternately Place Feet on Step/Stool: Able to complete >  2 steps/needs minimal assist Standing Unsupported, One Foot in Front: Needs help to step but can hold 15 seconds Standing on One Leg: Tries to lift leg/unable to hold 3 seconds but remains standing independently Total Score: 31  Extremity/Trunk Assessment RUE Assessment RUE Assessment: Within Functional Limits LUE Assessment LUE Assessment: Within Functional Limits (Brunstrom VI, decre FMC)  See FIM for current functional status Refer to Care Plan for Long Term Goals  Recommendations for other services: None  Discharge Criteria: Patient will be discharged from OT if patient refuses treatment 3 consecutive times without medical reason, if treatment goals not met, if there is a change in medical status, if patient makes no progress towards goals or if patient is discharged from hospital.  The above assessment, treatment plan, treatment alternatives and goals were discussed and mutually agreed upon: by patient  1:1 OT eval: OT purpose, role and goals discussed with pt. Self care retraining at sink level (pt's choice); focus  on sit to stand, standing balance, activity and standing tolerance, functional ambulation without AD, toileting, toilet transfer, simple problem solving, working memory, selective attention to task in a minimal distracting environment.   Roney Mans [facility] 04/13/2012, 9:12 AM

## 2012-04-13 NOTE — Progress Notes (Signed)
[employer] [id number] approved revlimid from 04/13/12-10/14/12.

## 2012-04-13 NOTE — Evaluation (Signed)
Physical Therapy Assessment and Plan  Patient Details  Name: [name] MRN: [id number] Date of Birth: October 28, 1935  PT Diagnosis: Abnormality of gait, Cognitive deficits, Difficulty walking and Muscle weakness Rehab Potential: Good ELOS: 7-10 days   Today's Date: 04/13/2012 Time: 0730-0830 Time Calculation (min): 60 min  Problem List:  Patient Active Problem List  Diagnosis  . HYPERLIPIDEMIA NEC/NOS  . ANXIETY  . DEPRESSION  . HYPERTENSION  . BREAST MASS, RIGHT  . BACK PAIN, LUMBAR  . OSTEOPENIA  . INSOMNIA  . HYPERGLYCEMIA  . POSTMENOPAUSAL STATUS  . Obesity  . Dehydration  . Acute renal failure  . Hypercalcemia  . Hip pain  . Anemia  . Acute subdural hematoma  . Multiple myeloma  . Lytic lesion of bone on x-ray  . Constipation due to slow transit/hypercalcemia  . Plasmacytoma of bone-right seventh rib    Past Medical History:  Past Medical History  Diagnosis Date  . Anxiety   . Hypertension   . Hyperlipidemia   . Depression   . Osteopenia   . Multiple myeloma 03/2012   Past Surgical History:  Past Surgical History  Procedure Date  . Breast lumpectomy     Assessment & Plan Clinical Impression: Patient is a 76 y.o. year old female with recent admission to the hospital on 7/8 with 3 month hx back pain, extremity weakness and urinary incontinence. She was admitted to [facility] On 7/08 by [facility] and workup revealed acute renal failure, hypercalcemia, proteinuria and MRI L spine showed innumerable lesions. Heme/Onc was consulted for workup for multiple myeloma. Pt was improving and unfortunately sustained an unwitnessed fall 7/10 with subsequent subdural hematoma. Patient transferred to CIR on 04/12/2012 .   Patient currently requires min with mobility secondary to muscle weakness, decreased coordination and decreased motor planning, decreased attention, decreased awareness and decreased safety awareness and decreased standing balance and decreased balance  strategies.  Prior to hospitalization, patient was independent with mobility and lived with Spouse in a Independent living facility home.  Home access is  Level entry.  Patient will benefit from skilled PT intervention to maximize safe functional mobility, minimize fall risk and decrease caregiver burden for planned discharge home with 24 hour supervision.  Anticipate patient will benefit from follow up HH at discharge.  PT - End of Session Activity Tolerance: Tolerates 30+ min activity with multiple rests PT Assessment Rehab Potential: Good PT Plan PT Frequency: 1-2 X/day, 60-90 minutes Estimated Length of Stay: 7-10 days PT Treatment/Interventions: Ambulation/gait training;Balance/vestibular training;Discharge planning;Community reintegration;Cognitive remediation/compensation;Functional mobility training;Therapeutic Activities;Wheelchair propulsion/positioning;UE/LE Coordination activities;Stair training;Patient/family education;Pain management;UE/LE Strength taining/ROM;Therapeutic Exercise;Neuromuscular re-education;DME/adaptive equipment instruction PT Recommendation Follow Up Recommendations: Home health PT  PT Evaluation Precautions/Restrictions Precautions Precautions: Fall Restrictions Weight Bearing Restrictions: No  Vital Signs Therapy Vitals Pulse Rate: 98  (with activity)  Pain Pain Assessment Pain Score:   3 Pain Type: Chronic pain Pain Location: Back Pain Intervention(s): RN made aware Home Living/Prior Functioning Home Living Lives With: Spouse Available Help at Discharge: Family Type of Home: Independent living facility Home Access: Level entry Home Layout: One level Prior Function Level of Independence: Independent with basic ADLs;Independent with gait;Independent with transfers Able to Take Stairs?: Yes Driving: Yes  Cognition Arousal/Alertness: Awake/alert Attention: Selective;Alternating Selective Attention: Impaired Alternating Attention:  Impaired Awareness: Impaired Awareness Impairment: Emergent impairment Behaviors:  (anxious) Safety/Judgment: Impaired Comments: decreased safety awareness Sensation Sensation Light Touch: Appears Intact Proprioception: Appears Intact Coordination Gross Motor Movements are Fluid and Coordinated: No Coordination and Movement Description: mildly ataxic  gait when fatigued Motor  Motor Motor: Within Functional Limits Motor - Skilled Clinical Observations: generalized weakness  Mobility Bed Mobility Supine to Sit: 5: Supervision (increased time) Supine to Sit Details (indicate cue type and reason): cues for technique Transfers Stand Pivot Transfers: 4: Min assist Stand Pivot Transfer Details (indicate cue type and reason): cues for sequencing and safety Locomotion  Ambulation Ambulation/Gait Assistance: 4: Min assist Ambulation Distance (Feet): 50 Feet Assistive device: None Ambulation/Gait Assistance Details: decreased arm swing, slight ataxia initially, improved with further gait distance, increased sway Stairs / Additional Locomotion Stairs: Yes Stairs Assistance: 4: Min assist Stairs Assistance Details (indicate cue type and reason): cues for safety Stair Management Technique: Two rails Number of Stairs: 5   Trunk/Postural Assessment  Cervical Assessment Cervical Assessment: Within Functional Limits Thoracic Assessment Thoracic Assessment: Within Functional Limits Lumbar Assessment Lumbar Assessment:  (increased lordosis) Postural Control Postural Control:  (generalized weakness)  Balance Standardized Balance Assessment Standardized Balance Assessment: Berg Balance Test Berg Balance Test Sit to Stand: Able to stand  independently using hands Standing Unsupported: Able to stand 2 minutes with supervision Sitting with Back Unsupported but Feet Supported on Floor or Stool: Able to sit safely and securely 2 minutes Stand to Sit: Controls descent by using  hands Transfers: Able to transfer with verbal cueing and /or supervision Standing Unsupported with Eyes Closed: Able to stand 10 seconds with supervision Standing Ubsupported with Feet Together: Needs help to attain position but able to stand for 30 seconds with feet together From Standing, Reach Forward with Outstretched Arm: Can reach forward >12 cm safely (5") From Standing Position, Pick up Object from Floor: Unable to pick up shoe, but reaches 2-5 cm (1-2") from shoe and balances independently From Standing Position, Turn to Look Behind Over each Shoulder: Looks behind one side only/other side shows less weight shift Turn 360 Degrees: Needs close supervision or verbal cueing Standing Unsupported, Alternately Place Feet on Step/Stool: Able to complete >2 steps/needs minimal assist Standing Unsupported, One Foot in Front: Needs help to step but can hold 15 seconds Standing on One Leg: Tries to lift leg/unable to hold 3 seconds but remains standing independently Total Score: 31  Extremity Assessment      RLE Assessment RLE Assessment: Within Functional Limits LLE Assessment LLE Assessment: Within Functional Limits  See FIM for current functional status Refer to Care Plan for Long Term Goals  Recommendations for other services: None  Discharge Criteria: Patient will be discharged from PT if patient refuses treatment 3 consecutive times without medical reason, if treatment goals not met, if there is a change in medical status, if patient makes no progress towards goals or if patient is discharged from hospital.  The above assessment, treatment plan, treatment alternatives and goals were discussed and mutually agreed upon: by patient  Treatment initiated during session: Gait training without AD with cognitive challenge, pt required min A for balance, frequent stopping of gait to focus on cognitive task.  Bathroom transfers and mobility with min cuing for sequencing, min A for balance.   Dynamic gait with min A for direction changes and turns.  Pt limited by decreased safety awareness, decreased attention.  Good motivation to improve.   Paddy Walthall 04/13/2012, 8:51 AM

## 2012-04-13 NOTE — Progress Notes (Signed)
Faxed revlimid prescription to [facility] °

## 2012-04-13 NOTE — Progress Notes (Signed)
Patient information reviewed and entered into [facility] system by Arly Salminen, RN, CRRN, PPS Coordinator.  Information including medical coding and functional independence measure will be reviewed and updated through discharge.     Per nursing patient was given "Data Collection Information Summary for Patients in Inpatient Rehabilitation Facilities with attached "Privacy Act Statement-Health Care Records" upon admission.   

## 2012-04-13 NOTE — Progress Notes (Signed)
Pt. arrived to room 4009 accompanied by husband, son.  Oriented to room and rehab process, safety plan reviewed.  VSS, denies pain at present.  Memory deficits evident. Chemo precautions initiated. Carlean Purl

## 2012-04-13 NOTE — Progress Notes (Signed)
Patient ID: [name], female   DOB: 1936-08-29, 75 y.o.   MRN: [id number] Subjective/Complaints: Mild to moderate back pain last night. Otherwise did well A 12 point review of systems has been performed and if not noted above is otherwise negative.   Objective: Vital Signs: Blood pressure 139/81, pulse 98, temperature 97.9 F (36.6 C), temperature source Oral, resp. rate 19, height 5\' 7"  (1.702 m), weight 77.2 kg (170 lb 3.1 oz), SpO2 96.00%. No results found. No results found for this basename: WBC:2,HGB:2,HCT:2,PLT:2 in the last 72 hours No results found for this basename: NA:2,K:2,CL:2,CO2:2,GLUCOSE:2,BUN:2,CREATININE:2,CALCIUM:2 in the last 72 hours CBG (last 3)  No results found for this basename: GLUCAP:3 in the last 72 hours  Wt Readings from Last 3 Encounters:  04/12/12 77.2 kg (170 lb 3.1 oz)  04/04/12 79.6 kg (175 lb 7.8 oz)  04/04/12 79.6 kg (175 lb 7.8 oz)    Physical Exam:   General: Alert and oriented x 3, No apparent distress HEENT: Head is normocephalic, atraumatic, PERRLA, EOMI, sclera anicteric, oral mucosa pink and moist, dentition intact, ext ear canals clear,  Neck: Supple without JVD or lymphadenopathy Heart: Reg rate and rhythm. No murmurs rubs or gallops Chest: CTA bilaterally without wheezes, rales, or rhonchi; no distress Abdomen: Soft, non-tender, non-distended, bowel sounds positive. Extremities: No clubbing, cyanosis, or edema. Pulses are 2+ Skin: Clean and intact without signs of breakdown Neuro: Pt is cognitively appropriate with generally reasonable insight, memory, and awareness. Cranial nerves 2-12 are intact. Sensory exam is normal. Reflexes are 2+ in all 4's. Fine motor coordination is fair. No tremors. Motor function is grossly 4/5.  Musculoskeletal: Full ROM, mild pain in the low back with flexion and palpation. Posture appropriate in bed Psych: Pt's affect is appropriate. Pt is cooperative    Assessment/Plan: 1. Functional deficits  secondary to multiple myeloma, SDH which require 3+ hours per day of interdisciplinary therapy in a comprehensive inpatient rehab setting. Physiatrist is providing close team supervision and 24 hour management of active medical problems listed below. Physiatrist and rehab team continue to assess barriers to discharge/monitor patient progress toward functional and medical goals. FIM:                   Comprehension Comprehension Mode: Auditory Comprehension: 3-Understands basic 50 - 74% of the time/requires cueing 25 - 50%  of the time  Expression Expression Mode: Verbal Expression: 2-Expresses basic 25 - 49% of the time/requires cueing 50 - 75% of the time. Uses single words/gestures.          Medical Problem List and Plan:  1. DVT Prophylaxis/Anticoagulation: Mechanical: Sequential compression devices, below knee Bilateral lower extremities  2. Pain Management: Pressure relief measures in conjunction with prn oxycodone effective. Her low back remains a source of pain at night and with activity.   -consider scheduling meds if pain becomes more of a problem 3. Mood: Looks well. Dealing with her diagnosis realistically. Will have LCSW to follow up for formal evaluation.  4. Neuropsych: This patient is capable of making decisions on his/her own behalf.  5. Multiple Myeloma: Velcade on 3 wks then 1 week off with dexamethasone 40 mg/wk. Acyclovir to decrease risk for viral infection. Patient to follow up with Dr. Gaylyn Rong for initiation of Revlimid at later date.  6. Acute renal failure: resolved with hydration. Recheck in am.  7. Chronic constipation: Took 4 stool softeners daily with MOM twice a week. Will try miralax twice a day. Suppository prn   LOS (  Days) 1 A FACE TO FACE EVALUATION WAS PERFORMED  SWARTZ,ZACHARY T 04/13/2012, 7:08 AM

## 2012-04-13 NOTE — Evaluation (Signed)
Speech Language Pathology Assessment and Plan  Patient Details  Name: [name] MRN: [id number] Date of Birth: 1936/08/13  SLP Diagnosis: Cognitive Impairments  Rehab Potential: Excellent ELOS: 7-10 days   Today's Date: 04/13/2012 Time: 0930-1030 Time Calculation (min): 60 min  Skilled Therapeutic Intervention: Administered cognitive-linguistic evaluation. Please see below for details.   Problem List:  Patient Active Problem List  Diagnosis  . HYPERLIPIDEMIA NEC/NOS  . ANXIETY  . DEPRESSION  . HYPERTENSION  . BREAST MASS, RIGHT  . BACK PAIN, LUMBAR  . OSTEOPENIA  . INSOMNIA  . HYPERGLYCEMIA  . POSTMENOPAUSAL STATUS  . Obesity  . Dehydration  . Acute renal failure  . Hypercalcemia  . Hip pain  . Anemia  . Acute subdural hematoma  . Multiple myeloma  . Lytic lesion of bone on x-ray  . Constipation due to slow transit/hypercalcemia  . Plasmacytoma of bone-right seventh rib  . SDH (subdural hematoma)   Past Medical History:  Past Medical History  Diagnosis Date  . Anxiety   . Hypertension   . Hyperlipidemia   . Depression   . Osteopenia   . Multiple myeloma 03/2012   Past Surgical History:  Past Surgical History  Procedure Date  . Breast lumpectomy     Assessment / Plan / Recommendation Clinical Impression  Pt transferred to CIR 04/12/12 and presents with mild cognitive impairments characterized by decreased  selective attention, decreased safety and emergent awareness, decreased complex problem solving, decreased short-term and working memory and delayed processing which impact pt's overall functional independence and safety. Pt would benefit from skilled SLP intervention to maximize cognitive function and overall independence. Anticipate pt will need 24 hour supervision at discharge.     SLP Assessment  Patient will need skilled Speech Lanaguage Pathology Services during CIR admission    Recommendations  Follow up Recommendations:  (TBD) Equipment  Recommended: None recommended by SLP    SLP Frequency 1-2 X/day, 30-60 minutes   SLP Treatment/Interventions Cognitive remediation/compensation;Cueing hierarchy;Functional tasks;Internal/external aids;Environmental controls;Therapeutic Activities    Pain Pain Assessment Pain Assessment: 0-10 Pain Score:   3 Pain Type: Chronic pain Pain Location: Back Pain Descriptors: Aching Pain Intervention(s): Repositioned;Rest Prior Functioning Type of Home: Independent living facility Lives With: Spouse Available Help at Discharge: Family  Short Term Goals: Week 1: SLP Short Term Goal 1 (Week 1): Pt will utilize external aids to recall new, daiily information with supervision question and semantic cues.  SLP Short Term Goal 2 (Week 1): Pt will demonstrate complex problem solving with supervision question and semantic cues.  SLP Short Term Goal 3 (Week 1): Pt will demonstrate alternating attention between tasks with supervision verbal ceus for redirection SLP Short Term Goal 4 (Week 1): Pt will demonstrate appropriate safety awareness and utilize call bell to express wants/needs with Mod I.   See FIM for current functional status Refer to Care Plan for Long Term Goals  Recommendations for other services: None  Discharge Criteria: Patient will be discharged from SLP if patient refuses treatment 3 consecutive times without medical reason, if treatment goals not met, if there is a change in medical status, if patient makes no progress towards goals or if patient is discharged from hospital.  The above assessment, treatment plan, treatment alternatives and goals were discussed and mutually agreed upon: by patient  Aleysha Meckler 04/13/2012, 11:40 AM

## 2012-04-13 NOTE — Telephone Encounter (Signed)
[facility] faxed Decision note for revlimid.  Revlimid has been approved through 10/14/2012 under medicare part D benefit.

## 2012-04-13 NOTE — Progress Notes (Signed)
Occupational Therapy Session Note  Patient Details  Name: [name] MRN: [id number] Date of Birth: 14-Sep-1936  Today's Date: 04/13/2012 Time: 1330-1400 Time Calculation (min): 30 min  Short Term Goals: Week 1:  OT Short Term Goal 1 (Week 1): Pt will transfer to toilet wtih supervision with LRAD OT Short Term Goal 2 (Week 1): Pt will demonstrate selective attention with min A in a moderate distracting environment OT Short Term Goal 3 (Week 1): Pt will call for toileting needs 75% of time during day OT Short Term Goal 4 (Week 1): Pt wound don LB clothing with min A including shoes/ special foot boot  Skilled Therapeutic Interventions/Progress Updates:    1:1 focus on functional ambulation without an AD with steadying A, activity tolerance ambulating down to ADL apartment to discuss bathroom setup and transfers into bathroom. Pt uses a shower stall with small ledge, practiced stepping over our simulated shower stall with min A (pt has grab bar can use), and transfers to small seat without a back. Perform toilet transfer and toileting with steadying A. Pt with noted with decreased coordination of left hand with washing hands and holding on to door frame with the left hand.  Therapy Documentation Precautions:  Precautions Precautions: Fall Precaution Comments: no closed toe shoe on left foot- wound on greater left toe, has a special boot Restrictions Weight Bearing Restrictions: No Pain: Pain Assessment Pain Assessment: 0-10 Pain Score:   6 Pain Type: Chronic pain Pain Location: Back Pain Orientation: Lower;Mid Pain Descriptors: Aching Pain Onset: On-going Patients Stated Pain Goal: 2 Pain Intervention(s): Medication (See eMAR);Repositioned  See FIM for current functional status  Therapy/Group: Individual Therapy  Roney Mans [facility] 04/13/2012, 3:05 PM

## 2012-04-14 ENCOUNTER — Other Ambulatory Visit: Payer: Self-pay | Admitting: Certified Registered Nurse Anesthetist

## 2012-04-14 ENCOUNTER — Inpatient Hospital Stay (HOSPITAL_COMMUNITY): Payer: Medicare Other | Admitting: Speech Pathology

## 2012-04-14 ENCOUNTER — Inpatient Hospital Stay (HOSPITAL_COMMUNITY): Payer: Medicare Other | Admitting: Occupational Therapy

## 2012-04-14 ENCOUNTER — Inpatient Hospital Stay (HOSPITAL_COMMUNITY): Payer: Medicare Other | Admitting: Physical Therapy

## 2012-04-14 ENCOUNTER — Inpatient Hospital Stay (HOSPITAL_COMMUNITY): Payer: Medicare Other | Admitting: *Deleted

## 2012-04-14 ENCOUNTER — Telehealth: Payer: Self-pay | Admitting: *Deleted

## 2012-04-14 ENCOUNTER — Other Ambulatory Visit: Payer: Self-pay | Admitting: *Deleted

## 2012-04-14 NOTE — Progress Notes (Signed)
Physical Therapy Note  Patient Details  Name: [name] MRN: [id number] Date of Birth: 01-04-1936 Today's Date: 04/14/2012  Time: 1100-1145 45 minutes  No c/o pain.  Gait training without AD with min A, decreased cadence, c/o back "tightness" with gait.  Gait with RW with decreased back tightness and increased cadence.  Dynamic gait training with RW with supervision for backward, sidestepping with mod cuing.  Cognitive challenges while walking with supervision for gait, min A for attention to task, awareness of obstacles when challenged cognitively.  Coordination challenge with bouncing/tossing ball with min A for balance reactions, mod -max A for alternating attention, mod A for LE/UE coordination.  Pt fatigued easily this session requiring frequent seated rests.  Individual therapy   Nguyet Mercer 04/14/2012, 12:23 PM

## 2012-04-14 NOTE — Progress Notes (Signed)
Speech Language Pathology Daily Session Note  Patient Details  Name: [name] MRN: [id number] Date of Birth: [birth year]  Today's Date: 04/14/2012 Time: 8657-8469 Time Calculation (min): 85 min  Short Term Goals: Week 1: SLP Short Term Goal 1 (Week 1): Pt will utilize external aids to recall new, daiily information with supervision question and semantic cues.  SLP Short Term Goal 2 (Week 1): Pt will demonstrate complex problem solving with supervision question and semantic cues.  SLP Short Term Goal 3 (Week 1): Pt will demonstrate alternating attention between tasks with supervision verbal ceus for redirection SLP Short Term Goal 4 (Week 1): Pt will demonstrate appropriate safety awareness and utilize call bell to express wants/needs with Mod I.   Skilled Therapeutic Interventions: Treatment focus on organization, complex problem solving and working memory. Pt required Mod verbal and semantic cues with extra time for complex problem solving and organization of information for functional task of balancing a checkbook.  Pt demonstrated emergent awareness of difficulty of task and reported she should not be responsible for finances at home.  Pt also participated in complex card game "blink" and required Mod A semantic cues for recall of rules of game and for functional problem solving. Suspect pt's fatigue and pain impacted attention and problem solving with tasks.    FIM:  Comprehension Comprehension Mode: Auditory Comprehension: 5-Understands basic 90% of the time/requires cueing < 10% of the time Expression Expression Mode: Verbal Expression: 5-Expresses basic 90% of the time/requires cueing < 10% of the time. Social Interaction Social Interaction: 5-Interacts appropriately 90% of the time - Needs monitoring or encouragement for participation or interaction. Problem Solving Problem Solving: 3-Solves basic 50 - 74% of the time/requires cueing 25 - 49% of the time Memory Memory:  3-Recognizes or recalls 50 - 74% of the time/requires cueing 25 - 49% of the time  Pain 5 out of 10 in back, pt pre-medicated  Therapy/Group: Individual Therapy  Arlis Yale 04/14/2012, 11:04 AM

## 2012-04-14 NOTE — Progress Notes (Signed)
[facility] Individual Statement of Services  Patient Name:  [name]  Date:  04/14/2012  Welcome to the [facility].  Our goal is to provide you with an individualized program based on your diagnosis and situation, designed to meet your specific needs.  With this comprehensive rehabilitation program, you will be expected to participate in at least 3 hours of rehabilitation therapies Monday-Friday, with modified therapy programming on the weekends.  Your rehabilitation program will include the following services:  Physical Therapy (PT), Occupational Therapy (OT), Speech Therapy (ST), 24 hour per day rehabilitation nursing, Therapeutic Recreaction (TR), Case Management (Social Worker), Rehabilitation Medicine, Nutrition Services and Pharmacy Services  Weekly team conferences will be held on Tuesdays to discuss your progress.  Your  Social Worker will talk with you frequently to get your input and to update you on team discussions.  Team conferences with you and your family in attendance may also be held.  Expected length of stay: 7 - 10 days Overall anticipated outcome: supervision  Depending on your progress and recovery, your program may change.  Your Social Worker will coordinate services and will keep you informed of any changes.  Your Social Worker's name and contact numbers are listed  below.  The following services may also be recommended but are not provided by the [facility]:   Driving Evaluations  Home Health Rehabiltiation Services  Outpatient Rehabilitatation [facility]  Vocational Rehabilitation   Arrangements will be made to provide these services after discharge if needed.  Arrangements include referral to agencies that provide these services.  Your insurance has been verified to be:  Medicare and AARP Your primary doctor is:  Dr. Alden Hipp  Pertinent information will be shared with your doctor and  your insurance company.    Social Worker:  Hazel Dell, [place] [phone] or [phone]  Information discussed with and copy given to patient by: Amada Jupiter, 04/14/2012, 8:47 AM

## 2012-04-14 NOTE — Progress Notes (Signed)
Occupational Therapy Session Note  Patient Details  Name: [name] MRN: [id number] Date of Birth: Aug 20, 1936  Today's Date: 04/14/2012 Time: 1191-4782 Time Calculation (min): 55 min  Short Term Goals: Week 1:  OT Short Term Goal 1 (Week 1): Pt will transfer to toilet wtih supervision with LRAD OT Short Term Goal 2 (Week 1): Pt will demonstrate selective attention with min A in a moderate distracting environment OT Short Term Goal 3 (Week 1): Pt will call for toileting needs 75% of time during day OT Short Term Goal 4 (Week 1): Pt wound don LB clothing with min A including shoes/ special foot boot  Skilled Therapeutic Interventions/Progress Updates:    1:1 self care retraining at shower level, sitting on shower chair. Pt with increased sensitivity on back with water temperature. Focus on sit to stand, standing balance, functional mobility, including ambulation, working memory, attention, minimize self distractions (needed mod cuing to stay on task to complete task, organize task.), activity tolerance- pt with some labored breathing with dressing but stats remained WFL. Pt with frustration towards herself when back discomfort is limiting her ability to perform LB dressing or making it difficulty to perform sit to stands- continues to try hard and prefers extra time for all tasks  Therapy Documentation Precautions:  Precautions Precautions: Fall Precaution Comments: no closed toe shoe on left foot- wound on greater left toe, has a special boot Restrictions Weight Bearing Restrictions: No Pain:  discomfort in back with sit to stands,   See FIM for current functional status  Therapy/Group: Individual Therapy  Roney Mans [facility] 04/14/2012, 9:06 AM

## 2012-04-14 NOTE — Progress Notes (Signed)
Social Work  Social Work Assessment and Plan  Patient Details  Name: [name] MRN: [id number] Date of Birth: 16-Sep-1936  Today's Date: 04/14/2012  Problem List:  Patient Active Problem List  Diagnosis  . HYPERLIPIDEMIA NEC/NOS  . ANXIETY  . DEPRESSION  . HYPERTENSION  . BREAST MASS, RIGHT  . BACK PAIN, LUMBAR  . OSTEOPENIA  . INSOMNIA  . HYPERGLYCEMIA  . POSTMENOPAUSAL STATUS  . Obesity  . Dehydration  . Acute renal failure  . Hypercalcemia  . Hip pain  . Anemia  . Acute subdural hematoma  . Multiple myeloma  . Lytic lesion of bone on x-ray  . Constipation due to slow transit/hypercalcemia  . Plasmacytoma of bone-right seventh rib  . SDH (subdural hematoma)   Past Medical History:  Past Medical History  Diagnosis Date  . Anxiety   . Hypertension   . Hyperlipidemia   . Depression   . Osteopenia   . Multiple myeloma 03/2012   Past Surgical History:  Past Surgical History  Procedure Date  . Breast lumpectomy    Social History:  reports that she has never smoked. She has never used smokeless tobacco. She reports that she does not drink alcohol or use illicit drugs.  Family / Support Systems Marital Status: Married How Long?: 55 years Patient Roles: Spouse;Parent Spouse/Significant Other: husband, [name] @ [phone] Children: son, [name] @ [phone] Other Supports: neighbors (they live in a senior apartment community) Anticipated Caregiver: Husband Ability/Limitations of Caregiver: Husband is retired - no limitations Caregiver Availability: 24/7 Family Dynamics: Pt describes husband as very supportive and notes she has to "...make him get out of the house sometimes..."  Social History Preferred language: English Religion: [employer] Cultural Background: NA Education: HS grad Read: Yes Write: Yes Employment Status: Retired 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  Speaks easily about her new diagnosis of "bone cancer...sometimes I can't believe I am saying that.Marland KitchenMarland KitchenI have my moments when I get upset".  Believes she is coping well with new CA diagnosis overall.  Denies any s/s of depression or anxiety.  Depression screen = 0, therefore, no formal psychiatry intervention indicated at this time but will monitor. Recent Psychosocial Issues: None Pyschiatric History: None Substance Abuse History: None  Patient / Family Perceptions, Expectations & Goals Pt/Family understanding of illness & functional limitations: Pt and husband with basic understanding of her multiple medical issues.  Notes she believes further chemo and radiation therapies are planned.   Premorbid pt/family roles/activities: Retired couple who are still active at home and in community Anticipated changes in roles/activities/participation: Husband will need to assume caregiver support - likely just supervision. Pt/family expectations/goals: "Just want to get through this.Marland KitchenMarland KitchenI quess then I have about 6  weeks of chemo to do..."  [employer]: None Premorbid Home Care/DME Agencies: None Transportation available at discharge: yes Resource referrals recommended: Support group (specify) (Available via [employer])  Discharge Planning Living Arrangements: Spouse/significant other Support Systems: Spouse/significant other;Friends/neighbors Type of Residence: Private residence Insurance Resources: Administrator (specify) Building services engineer) Financial Resources: Restaurant manager, fast food Screen Referred: No Living Expenses: Rent Money Management: Spouse Do  you have any problems obtaining your medications?: No Home Management: pt and husband share  Patient/Family Preliminary Plans: Pt to return home with husband who is prepared to provide 24/7 care Expected length of stay: 7-10 days  Clinical Impression Pleasant woman here after a fall and SDH occurring as she was hospitalized for new cancer diagnosis.  Emotionally stable at this point, but admits she "has my moments".  Good support from husband.  Anticipate short LOS.   [name] 04/14/2012, 1:59 PM

## 2012-04-14 NOTE — Progress Notes (Signed)
Occupational Therapy Session Note  Patient Details  Name: [name] MRN: [id number] Date of Birth: June 13, 1936  Today's Date: 04/14/2012 Time: 4098-1191 Time Calculation (min): 45 min  Short Term Goals: Week 1:  OT Short Term Goal 1 (Week 1): Pt will transfer to toilet wtih supervision with LRAD OT Short Term Goal 2 (Week 1): Pt will demonstrate selective attention with min A in a moderate distracting environment OT Short Term Goal 3 (Week 1): Pt will call for toileting needs 75% of time during day OT Short Term Goal 4 (Week 1): Pt wound don LB clothing with min A including shoes/ special foot boot  Skilled Therapeutic Interventions/Progress Updates:    1:1 OT with focus on attention to novel task, recall, and teaching of novel task.  Donned shoe in sitting with setup assist and c/o pain in back with bending.  Educated pt on bringing leg up to knee to increase independence and decrease pain.  Engaged in novel card matching activity with visually distracting background, encouraging pt to scan visually distracting background to match to previous card.  Pt required increased time with task and was unable to participate in the activity and carry on conversation.    Therapy Documentation Precautions:  Precautions Precautions: Fall Precaution Comments: no closed toe shoe on left foot- wound on greater left toe, has a special boot Restrictions Weight Bearing Restrictions: No Pain:   Pt with general c/o pain in back with donning shoe, but not rated.  See FIM for current functional status  Therapy/Group: Individual Therapy  Leonette Monarch 04/14/2012, 3:27 PM

## 2012-04-14 NOTE — Progress Notes (Signed)
Patient ID: DANIELLE MINK, female   DOB: Aug 22, 1936, 76 y.o.   MRN: [id number] Patient ID: BERTINA GUTHRIDGE, female   DOB: [birth year], 76 y.o.   MRN: [id number] Subjective/Complaints: Had a good day. Chest a little sore. A 12 point review of systems has been performed and if not noted above is otherwise negative.   Objective: Vital Signs: Blood pressure 125/77, pulse 92, temperature 98.4 F (36.9 C), temperature source Oral, resp. rate 18, height 5\' 7"  (1.702 m), weight 83.008 kg (183 lb), SpO2 92.00%. No results found.  Basename 04/13/12 0635  WBC 11.3*  HGB 8.5*  HCT 24.9*  PLT 181    Basename 04/13/12 0635  NA 137  K 3.5  CL 99  CO2 20  GLUCOSE 103*  BUN 14  CREATININE 0.99  CALCIUM 10.1   CBG (last 3)  No results found for this basename: GLUCAP:3 in the last 72 hours  Wt Readings from Last 3 Encounters:  04/13/12 83.008 kg (183 lb)  04/04/12 79.6 kg (175 lb 7.8 oz)  04/04/12 79.6 kg (175 lb 7.8 oz)    Physical Exam:   General: Alert and oriented x 3, No apparent distress HEENT: Head is normocephalic, atraumatic, PERRLA, EOMI, sclera anicteric, oral mucosa pink and moist, dentition intact, ext ear canals clear,  Neck: Supple without JVD or lymphadenopathy Heart: Reg rate and rhythm. No murmurs rubs or gallops Chest: CTA bilaterally without wheezes, rales, or rhonchi; no distress Abdomen: Soft, non-tender, non-distended, bowel sounds positive. Extremities: No clubbing, cyanosis, or edema. Pulses are 2+ Skin: Clean and intact without signs of breakdown Neuro: Pt is cognitively appropriate with generally reasonable insight, memory, and awareness. Cranial nerves 2-12 are intact. Sensory exam is normal. Reflexes are 2+ in all 4's. Fine motor coordination is fair. No tremors. Motor function is grossly 4/5.  Musculoskeletal: Full ROM, mild pain in the low back with flexion and palpation. Posture appropriate in bed Psych: Pt's affect is appropriate. Pt is  cooperative    Assessment/Plan: 1. Functional deficits secondary to multiple myeloma, SDH which require 3+ hours per day of interdisciplinary therapy in a comprehensive inpatient rehab setting. Physiatrist is providing close team supervision and 24 hour management of active medical problems listed below. Physiatrist and rehab team continue to assess barriers to discharge/monitor patient progress toward functional and medical goals. FIM:       FIM - Toileting Toileting steps completed by patient: Adjust clothing prior to toileting;Performs perineal hygiene;Adjust clothing after toileting Toileting: 4: Steadying assist  FIM - Archivist Transfers: 4-To toilet/BSC: Min A (steadying Pt. > 75%);4-From toilet/BSC: Min A (steadying Pt. > 75%)  FIM - Bed/Chair Transfer Bed/Chair Transfer: 4: Chair or W/C > Bed: Min A (steadying Pt. > 75%);4: Bed > Chair or W/C: Min A (steadying Pt. > 75%);5: Supine > Sit: Supervision (verbal cues/safety issues)  FIM - Locomotion: Wheelchair Locomotion: Wheelchair: 1: Total Assistance/staff pushes wheelchair (Pt<25%) FIM - Locomotion: Ambulation Ambulation/Gait Assistance: 4: Min assist Locomotion: Ambulation: 2: Travels 50 - 149 ft with minimal assistance (Pt.>75%)  Comprehension Comprehension Mode: Auditory Comprehension: 5-Understands basic 90% of the time/requires cueing < 10% of the time  Expression Expression Mode: Verbal Expression: 5-Expresses basic 90% of the time/requires cueing < 10% of the time.  Social Interaction Social Interaction: 5-Interacts appropriately 90% of the time - Needs monitoring or encouragement for participation or interaction.  Problem Solving Problem Solving: 4-Solves basic 75 - 89% of the time/requires cueing 10 - 24% of the time  Memory  Memory: 3-Recognizes or recalls 50 - 74% of the time/requires cueing 25 - 49% of the time Medical Problem List and Plan:  1. DVT Prophylaxis/Anticoagulation:  Mechanical: Sequential compression devices, below knee Bilateral lower extremities  2. Pain Management: Pressure relief measures in conjunction with prn oxycodone effective. Her low back remains a source of pain at night and with activity.   -consider scheduling meds if pain becomes more of a problem 3. Mood: Looks well. Dealing with her diagnosis realistically. Will have LCSW to follow up for formal evaluation.  4. Neuropsych: This patient is capable of making decisions on his/her own behalf.  5. Multiple Myeloma: Velcade on 3 wks then 1 week off with dexamethasone 40 mg/wk. Acyclovir to decrease risk for viral infection. Patient to follow up with Dr. Gaylyn Rong for initiation of Revlimid at later date.  6. Acute renal failure: resolved with hydration. Recheck in am.  7. Chronic constipation: continue current regimen. No problems at present  LOS (Days) 2 A FACE TO FACE EVALUATION WAS PERFORMED  SWARTZ,ZACHARY T 04/14/2012, 7:32 AM

## 2012-04-14 NOTE — Progress Notes (Signed)
Physical Therapy Note  Patient Details  Name: [name] MRN: [id number] Date of Birth: [birth year] Today's Date: 04/14/2012  Time: 1430-1500 30 minutes  Pt c/o back pain, RN made aware.  Gait in controlled and household environments with supervision, pt requires min-mod cuing for RW safety in household environments.  Kitchen mobility with mod cuing for organization and sequencing, supervision for balance with RW.  Pt limited by increased back pain this session requiring more frequent rests.  Individual therapy   Artem Bunte 04/14/2012, 3:26 PM

## 2012-04-14 NOTE — Evaluation (Signed)
Recreational Therapy Assessment and Plan  Patient Details  Name: [name] MRN: [id number] Date of Birth: 04-13-1936 Today's Date: 04/14/2012  Rehab Potential: Good ELOS: 10 days   Assessment Clinical ImpressionProblem List:  Patient Active Problem List   Diagnosis   .  HYPERLIPIDEMIA NEC/NOS   .  ANXIETY   .  DEPRESSION   .  HYPERTENSION   .  BREAST MASS, RIGHT   .  BACK PAIN, LUMBAR   .  OSTEOPENIA   .  INSOMNIA   .  HYPERGLYCEMIA   .  POSTMENOPAUSAL STATUS   .  Obesity   .  Dehydration   .  Acute renal failure   .  Hypercalcemia   .  Hip pain   .  Anemia   .  Acute subdural hematoma   .  Multiple myeloma   .  Lytic lesion of bone on x-ray   .  Constipation due to slow transit/hypercalcemia   .  Plasmacytoma of bone-right seventh rib    Past Medical History:  Past Medical History   Diagnosis  Date   .  Anxiety    .  Hypertension    .  Hyperlipidemia    .  Depression    .  Osteopenia    .  Multiple myeloma  03/2012    Past Surgical History:  Past Surgical History   Procedure  Date   .  Breast lumpectomy     Assessment & Plan  Clinical Impression: Patient is a 76 y.o. year old female female with hx anxiety, HTN, DDD presented 7/8 with 3 month hx back pain, extremity weakness and urinary incontinence. She was admitted to [facility] On 7/08 by [facility] and workup revealed acute renal failure, hypercalcemia, proteinuria and MRI L spine showed innumerable lesions. Heme/Onc was consulted for workup for multiple myeloma. Pt was improving and unfortunately sustained an unwitnessed fall 7/10 with subsequent subdural hematoma. She was tx to [facility] for Dr. Gerlene Fee consulted for input and recommended follow up CT for stability. Started on steroids by hospitalitis for cerebral edema. She remained neurologically stable and did not require any surgical intervention. CT head on 07/11 with slight improvement. Pathology positive for plasma cytoma and patient started on  treatment for myeloma on 07/16. Treated with dose of aredia on 7/15.  Patient transferred to [facility] on 04/12/2012 .   Pt presents with decreased activity tolerance, decreased functional, mobility, decreased balance, decreased safety, decreased cognition, increased pain all limiting pt's independence with leisure/community pursuits. 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;Sports - Exercise (Comment) (walking) Other Leisure Interests: Cooking/Baking Leisure Participation Style: With Family/Friends;Alone Awareness of Community Resources: Good-identify 3 post discharge leisure resources  Plan Rec Therapy Plan Is patient appropriate for Therapeutic Recreation?: Yes Rehab Potential: Good Treatment times per week: min 1 time  Estimated Length of Stay: 10 days TR Treatment/Interventions: Adaptive equipment instruction;1:1 session;Balance/vestibular training;Community reintegration;Cognitive remediation/compensation;Functional mobility training;Patient/family education;Recreation/leisure participation;Therapeutic activities;Therapeutic exercise  Recommendations for other services: None  Discharge Criteria: Patient will be discharged from TR if patient refuses treatment 3 consecutive times without medical reason.  If treatment goals not met, if there is a change in medical status, if patient makes no progress towards goals or if patient is discharged from hospital.  The above assessment, treatment plan, treatment alternatives and goals were discussed and mutually agreed upon: by patient  [name] 04/14/2012, 4:35 PM

## 2012-04-14 NOTE — Telephone Encounter (Signed)
Faxed Revlimid consent forms to [employer] for pt enrollement and received confirmation pt is registered.  Completed Prescriber survey w/ [employer] and Berkley Harvey #[id number] obtained.   Faxed Auth # and Rx to [employer].   Called pt's husband and asked him to assist pt is doing the pt survey for [employer].  Gave him the the [employer] customer care center # [phone] for pt to complete survey.  Explained [employer] will be in touch to deliver the medication (revlimid) and to call when they receive it or if any problems.  He verbalized understanding.

## 2012-04-15 ENCOUNTER — Inpatient Hospital Stay (HOSPITAL_COMMUNITY): Payer: Medicare Other | Admitting: Speech Pathology

## 2012-04-15 ENCOUNTER — Inpatient Hospital Stay (HOSPITAL_COMMUNITY): Payer: Medicare Other | Admitting: Occupational Therapy

## 2012-04-15 ENCOUNTER — Inpatient Hospital Stay (HOSPITAL_COMMUNITY): Payer: Medicare Other | Admitting: Physical Therapy

## 2012-04-15 DIAGNOSIS — Z5189 Encounter for other specified aftercare: Secondary | ICD-10-CM

## 2012-04-15 DIAGNOSIS — S069X9A Unspecified intracranial injury with loss of consciousness of unspecified duration, initial encounter: Secondary | ICD-10-CM

## 2012-04-15 DIAGNOSIS — C9000 Multiple myeloma not having achieved remission: Secondary | ICD-10-CM

## 2012-04-15 DIAGNOSIS — W19XXXA Unspecified fall, initial encounter: Secondary | ICD-10-CM

## 2012-04-15 LAB — CBC WITH DIFFERENTIAL/PLATELET
Eosinophils Absolute: 0.2 10*3/uL (ref 0.0–0.7)
Eosinophils Relative: 2 % (ref 0–5)
Lymphocytes Relative: 29 % (ref 12–46)
MCH: 30.9 pg (ref 26.0–34.0)
Monocytes Absolute: 1 10*3/uL (ref 0.1–1.0)
Neutrophils Relative %: 59 % (ref 43–77)
Platelets: 190 10*3/uL (ref 150–400)
RBC: 2.69 MIL/uL — ABNORMAL LOW (ref 3.87–5.11)
WBC: 11.5 10*3/uL — ABNORMAL HIGH (ref 4.0–10.5)

## 2012-04-15 NOTE — Progress Notes (Signed)
Occupational Therapy Session Note  Patient Details  Name: [name] MRN: [id number] Date of Birth: Apr 20, 1936  Today's Date: 04/15/2012 Time: 0865-7846 Time Calculation (min): 40 min  Short Term Goals: Week 1:  OT Short Term Goal 1 (Week 1): Pt will transfer to toilet wtih supervision with LRAD OT Short Term Goal 2 (Week 1): Pt will demonstrate selective attention with min A in a moderate distracting environment OT Short Term Goal 3 (Week 1): Pt will call for toileting needs 75% of time during day OT Short Term Goal 4 (Week 1): Pt wound don LB clothing with min A including shoes/ special foot boot  Skilled Therapeutic Interventions/Progress Updates:    1:1 self care retraining at shower level with focus on organizing tasks, sequencing, simple problem solving, introduction to using AE for LB dressing to accomodate for LBP, pt needed frequent breaks for pain. Pt's brief was wet; pt unaware but does report dribbling a lot through out the day and urgency.    Therapy Documentation Precautions:  Precautions Precautions: Fall Precaution Comments: no closed toe shoe on left foot- wound on greater left toe, has a special boot Restrictions Weight Bearing Restrictions: No Pain:   c/o back pain- limiting performance  See FIM for current functional status  Therapy/Group: Individual Therapy  Roney Mans [facility] 04/15/2012, 10:26 AM

## 2012-04-15 NOTE — Progress Notes (Signed)
Physical Therapy Note  Patient Details  Name: [name] MRN: [id number] Date of Birth: 12-27-1935 Today's Date: 04/15/2012  11:00-11:15 individual therapy pt complained of severe pain in back and requested to get back to bed. Pt had just received pain medication.  Pt was taught sliding board transfer and performed with min assist. This technique was taught for possibility of decline in mobility for the future. Pt was open to the idea and transfer technique did not increase pain.  Pt declined the rest of the session due to pain. Julian Reil 04/15/2012, 11:28 AM

## 2012-04-15 NOTE — Progress Notes (Signed)
Occupational Therapy Session Note  Patient Details  Name: [name] MRN: [id number] Date of Birth: 10-Sep-1936  Today's Date: 04/15/2012 Time: 1330-1430 Time Calculation (min): 60 min  Short Term Goals: Week 1:  OT Short Term Goal 1 (Week 1): Pt will transfer to toilet wtih supervision with LRAD OT Short Term Goal 2 (Week 1): Pt will demonstrate selective attention with min A in a moderate distracting environment OT Short Term Goal 3 (Week 1): Pt will call for toileting needs 75% of time during day OT Short Term Goal 4 (Week 1): Pt wound don LB clothing with min A including shoes/ special foot boot  Skilled Therapeutic Interventions/Progress Updates:    1:1 focus on functional ambulation including navigating around obstacles, toileting, toilet transfers, getting in and out bed in ADL apartment with extra time to prevent pain, functional mobility in the kitchen to participate in making brownies. Pt with increased tolerance to standing and activity tolerance.  Therapy Documentation Precautions:  Precautions Precautions: Fall Precaution Comments: no closed toe shoe on left foot- wound on greater left toe, has a special boot Restrictions Weight Bearing Restrictions: No Pain:  no c/o pain "I feel better after my rest (in the bed)."  See FIM for current functional status  Therapy/Group: Individual Therapy  Roney Mans [facility] 04/15/2012, 3:43 PM

## 2012-04-15 NOTE — Progress Notes (Signed)
Speech Language Pathology Daily Session Note  Patient Details  Name: [name] MRN: [id number] Date of Birth: 26-Apr-1936  Today's Date: 04/15/2012 Time: 0800-0900 Time Calculation (min): 60 min  Short Term Goals: Week 1: SLP Short Term Goal 1 (Week 1): Pt will utilize external aids to recall new, daiily information with supervision question and semantic cues.  SLP Short Term Goal 2 (Week 1): Pt will demonstrate complex problem solving with supervision question and semantic cues.  SLP Short Term Goal 3 (Week 1): Pt will demonstrate alternating attention between tasks with supervision verbal ceus for redirection SLP Short Term Goal 4 (Week 1): Pt will demonstrate appropriate safety awareness and utilize call bell to express wants/needs with Mod I.   Skilled Therapeutic Interventions: Treatment focus on organization and utilization of working memory strategies to increase recall. Pt required Max verbal cues to utilize writing important information down to recall details of daily sessions and her care and to recall questions to ask the doctor.  Participated in medication management and demonstrated emergent awareness by utilizing a pill box at discharge to increase recall and organization of medications.    FIM:  Comprehension Comprehension Mode: Auditory Comprehension: 5-Understands basic 90% of the time/requires cueing < 10% of the time Expression Expression: 5-Expresses basic needs/ideas: With extra time/assistive device Social Interaction Social Interaction: 6-Interacts appropriately with others with medication or extra time (anti-anxiety, antidepressant). Problem Solving Problem Solving: 3-Solves basic 50 - 74% of the time/requires cueing 25 - 49% of the time Memory Memory: 3-Recognizes or recalls 50 - 74% of the time/requires cueing 25 - 49% of the time FIM - Eating Eating Activity: 6: More than reasonable amount of time  Pain No/Denies Pain  Therapy/Group: Individual  Therapy  [name] 04/15/2012, 10:40 AM

## 2012-04-15 NOTE — Progress Notes (Signed)
Physical Therapy Note  Patient Details  Name: [name] MRN: [id number] Date of Birth: Oct 11, 1935 Today's Date: 04/15/2012  Time: 1430-1500 30 minutes  Pt c/o back pain with bending, rolling, improved with rest and repositioned.  Bed mobility training to high bed (pt states her bed at home is very high).  Pt required min A to lift 1 LE into bed.  Attempt to teach pt log roll, pt c/o dizziness with rolling to R, states "this has been happening for a while", pt unable to tolerate log roll but able to supine to sit with min A.  Gait training with obstacle negotiation with cues for safety, sequencing and following directions, pt more disorganized this afternoon.  Gait with side and backward stepping with supervision, cues for safety with RW.  Pt limited by fatigue at end of session, assisted back to bed.  Individual therapy   Markitta Ausburn 04/15/2012, 3:03 PM

## 2012-04-15 NOTE — Progress Notes (Signed)
Subjective/Complaints: Slept fairly well. No new complaints today.  A 12 point review of systems has been performed and if not noted above is otherwise negative.   Objective: Vital Signs: Blood pressure 155/94, pulse 95, temperature 98 F (36.7 C), temperature source Oral, resp. rate 19, height 5\' 7"  (1.702 m), weight 83.008 kg (183 lb), SpO2 97.00%. No results found.  Basename 04/15/12 0650 04/13/12 0635  WBC 11.5* 11.3*  HGB 8.3* 8.5*  HCT 24.5* 24.9*  PLT 190 181    Basename 04/13/12 0635  NA 137  K 3.5  CL 99  CO2 20  GLUCOSE 103*  BUN 14  CREATININE 0.99  CALCIUM 10.1   CBG (last 3)  No results found for this basename: GLUCAP:3 in the last 72 hours  Wt Readings from Last 3 Encounters:  04/13/12 83.008 kg (183 lb)  04/04/12 79.6 kg (175 lb 7.8 oz)  04/04/12 79.6 kg (175 lb 7.8 oz)    Physical Exam:   General: Alert and oriented x 3, No apparent distress HEENT: Head is normocephalic, atraumatic, PERRLA, EOMI, sclera anicteric, oral mucosa pink and moist, dentition intact, ext ear canals clear,  Neck: Supple without JVD or lymphadenopathy Heart: Reg rate and rhythm. No murmurs rubs or gallops Chest: CTA bilaterally without wheezes, rales, or rhonchi; no distress Abdomen: Soft, non-tender, non-distended, bowel sounds positive. Extremities: No clubbing, cyanosis, or edema. Pulses are 2+ Skin: Clean and intact without signs of breakdown. Right great toe healing. Sutures out Neuro: Pt is cognitively appropriate with generally reasonable insight, memory, and awareness. Cranial nerves 2-12 are intact. Sensory exam is normal. Reflexes are 2+ in all 4's. Fine motor coordination is fair. No tremors. Motor function is grossly 4/5. Perhaps a little impulsive Musculoskeletal: Full ROM, mild pain in the low back with flexion and palpation. Posture appropriate in bed Psych: Pt's affect is appropriate. Pt is cooperative    Assessment/Plan: 1. Functional deficits secondary to  multiple myeloma, SDH which require 3+ hours per day of interdisciplinary therapy in a comprehensive inpatient rehab setting. Physiatrist is providing close team supervision and 24 hour management of active medical problems listed below. Physiatrist and rehab team continue to assess barriers to discharge/monitor patient progress toward functional and medical goals. FIM: FIM - Bathing Bathing Steps Patient Completed: Chest;Right Arm;Left Arm;Right lower leg (including foot);Front perineal area;Buttocks;Left lower leg (including foot);Abdomen;Right upper leg;Left upper leg Bathing: 4: Min-Patient completes 8-9 50f 10 parts or 75+ percent  FIM - Upper Body Dressing/Undressing Upper body dressing/undressing steps patient completed: Thread/unthread right sleeve of pullover shirt/dresss;Put head through opening of pull over shirt/dress;Thread/unthread left sleeve of pullover shirt/dress;Pull shirt over trunk Upper body dressing/undressing: 5: Supervision: Safety issues/verbal cues FIM - Lower Body Dressing/Undressing Lower body dressing/undressing steps patient completed: Thread/unthread right pants leg;Thread/unthread left pants leg;Pull pants up/down;Don/Doff right sock  FIM - Toileting Toileting steps completed by patient: Performs perineal hygiene;Adjust clothing after toileting Toileting: 3: Mod-Patient completed 2 of 3 steps  FIM - Diplomatic Services operational officer Devices: Grab bars Toilet Transfers: 5-To toilet/BSC: Supervision (verbal cues/safety issues);5-From toilet/BSC: Supervision (verbal cues/safety issues)  FIM - Bed/Chair Transfer Bed/Chair Transfer: 5: Chair or W/C > Bed: Supervision (verbal cues/safety issues);5: Bed > Chair or W/C: Supervision (verbal cues/safety issues)  FIM - Locomotion: Wheelchair Locomotion: Wheelchair: 1: Total Assistance/staff pushes wheelchair (Pt<25%) FIM - Locomotion: Ambulation Ambulation/Gait Assistance: 4: Min assist Locomotion:  Ambulation: 5: Travels 150 ft or more with supervision/safety issues  Comprehension Comprehension Mode: Auditory Comprehension: 5-Understands basic 90% of the  time/requires cueing < 10% of the time  Expression Expression Mode: Verbal Expression: 5-Expresses basic 90% of the time/requires cueing < 10% of the time.  Social Interaction Social Interaction: 5-Interacts appropriately 90% of the time - Needs monitoring or encouragement for participation or interaction.  Problem Solving Problem Solving: 3-Solves basic 50 - 74% of the time/requires cueing 25 - 49% of the time  Memory Memory: 3-Recognizes or recalls 50 - 74% of the time/requires cueing 25 - 49% of the time Medical Problem List and Plan:  1. DVT Prophylaxis/Anticoagulation: Mechanical: Sequential compression devices, below knee Bilateral lower extremities  2. Pain Management: Pressure relief measures in conjunction with prn oxycodone effective. Her low back remains a source of pain at night and with activity.   -consider scheduling meds if pain becomes more of a problem-doing fairly well at present 3. Mood: Looks well. Dealing with her diagnosis realistically. Will have LCSW to follow up for formal evaluation.  4. Neuropsych: This patient is capable of making decisions on his/her own behalf.  5. Multiple Myeloma: Velcade on 3 wks then 1 week off with dexamethasone 40 mg/wk. Acyclovir to decrease risk for viral infection. Patient to follow up with Dr. Gaylyn Rong for initiation of Revlimid at later date.  -she asked me about her scheduled mammogram for august which i recommended she follow through with  6. Acute renal failure: resolved with hydration. Recheck bmet normal 7. Chronic constipation: continue current regimen. No problems at present  LOS (Days) 3 A FACE TO FACE EVALUATION WAS PERFORMED  Leondra Cullin T 04/15/2012, 9:13 AM

## 2012-04-16 ENCOUNTER — Inpatient Hospital Stay (HOSPITAL_COMMUNITY): Payer: Medicare Other | Admitting: Speech Pathology

## 2012-04-16 ENCOUNTER — Encounter (HOSPITAL_COMMUNITY): Payer: Medicare Other

## 2012-04-16 ENCOUNTER — Inpatient Hospital Stay (HOSPITAL_COMMUNITY): Payer: Medicare Other | Admitting: Occupational Therapy

## 2012-04-16 ENCOUNTER — Inpatient Hospital Stay (HOSPITAL_COMMUNITY): Payer: Medicare Other | Admitting: Physical Therapy

## 2012-04-16 NOTE — Progress Notes (Signed)
Speech Language Pathology Daily Session Note  Patient Details  Name: HORTENSIA DUFFIN MRN: [id number] Date of Birth: 1936/03/17  Today's Date: 04/16/2012 Time: 7829-5621 Time Calculation (min): 30 min  Short Term Goals: Week 1: SLP Short Term Goal 1 (Week 1): Pt will utilize external aids to recall new, daiily information with supervision question and semantic cues.  SLP Short Term Goal 2 (Week 1): Pt will demonstrate complex problem solving with supervision question and semantic cues.  SLP Short Term Goal 3 (Week 1): Pt will demonstrate alternating attention between tasks with supervision verbal ceus for redirection SLP Short Term Goal 4 (Week 1): Pt will demonstrate appropriate safety awareness and utilize call bell to express wants/needs with Mod I.   Skilled Therapeutic Interventions: Treatment focus on working memory and comprehension of complex tasks. Pt required Mod verbal and semantic cues to utilize strategies to recall information and for comprehension of complex rules during participation of a working memory game. Pt demonstrated emergent awareness of difficulty of task and need for frequent cueing.    FIM:  Comprehension Comprehension Mode: Auditory Comprehension: 4-Understands basic 75 - 89% of the time/requires cueing 10 - 24% of the time Expression Expression: 5-Expresses basic needs/ideas: With extra time/assistive device Social Interaction Social Interaction: 6-Interacts appropriately with others with medication or extra time (anti-anxiety, antidepressant). Problem Solving Problem Solving: 3-Solves basic 50 - 74% of the time/requires cueing 25 - 49% of the time Memory Memory: 3-Recognizes or recalls 50 - 74% of the time/requires cueing 25 - 49% of the time  Pain Pain Assessment Pain Assessment: 0-10 Pain Score:   2 Pain Type: Acute pain Pain Location: Back Pain Descriptors: Aching Pain Frequency: Intermittent Pain Onset: On-going Pain Intervention(s): Medication  (See eMAR)  Therapy/Group: Individual Therapy  Elenie Coven 04/16/2012, 11:32 AM

## 2012-04-16 NOTE — Progress Notes (Signed)
Occupational Therapy Session Note  Patient Details  Name: [name] MRN: [id number] Date of Birth: Jun 15, 1936  Today's Date: 04/16/2012 Time: 1400-1500 Time Calculation (min): 60 min  Short Term Goals: Week 1:  OT Short Term Goal 1 (Week 1): Pt will transfer to toilet wtih supervision with LRAD OT Short Term Goal 2 (Week 1): Pt will demonstrate selective attention with min A in a moderate distracting environment OT Short Term Goal 3 (Week 1): Pt will call for toileting needs 75% of time during day OT Short Term Goal 4 (Week 1): Pt wound don LB clothing with min A including shoes/ special foot boot  Skilled Therapeutic Interventions: Therapeutic Activities Group (TAG) with emphasis on general conditioning.  Patient educated on core strengthening during seated upper body exercises and on benefit of general fitness using NuStep.   Patient required lower back support (pillow) during seated UE strengthening exercises low back pain as well as numerous rest breaks due to poor endurance.  Patient denied need for pain meds until after treatment.  Patient benefits from motivational exhortations and demo's improved participation when provided additional attention.  Therapy Documentation Precautions:  Precautions Precautions: Fall Precaution Comments: no closed toe shoe on left foot- wound on greater left toe, has a special boot Restrictions Weight Bearing Restrictions: Yes  Pain: Pain Assessment Pain Assessment: 0-10 Pain Score:   6 Pain Type: Chronic pain Pain Location: Back Pain Orientation: Lower Pain Descriptors: Jabbing Pain Frequency: Intermittent Pain Onset: With Activity Patients Stated Pain Goal: 0 Pain Intervention(s): Distraction;Medication (See eMAR);Repositioned Multiple Pain Sites: No  See FIM for current functional status  Group: Group Therapy  Georgeanne Nim 04/16/2012, 3:54 PM

## 2012-04-16 NOTE — Progress Notes (Signed)
Physical Therapy Note  Patient Details  Name: KEZIYAH KNEALE MRN: [id number] Date of Birth: 17-Sep-1936 Today's Date: 04/16/2012  1300-1355 (55 minutes) group Pain: pt reports bilateral hip and low back pain- unrated/premedicated Pt participated in PT group session focused on gait training/safety/endurance. Pt ambulates 160 feet X 2 with RW min to close SBA. No right knee instability noted.   Shantoria Ellwood,JIM 04/16/2012, 2:00 PM

## 2012-04-16 NOTE — Progress Notes (Signed)
Patient ID: [name], female   DOB: 04-28-1936, 76 y.o.   MRN: [id number] Subjective/Complaints: 7/20.  Slept fairly well. No new complaints today.  A 12 point review of systems has been performed and if not noted above is otherwise negative.  Chest- clear; CV- nl heart sounds; Abd- benign; Extr- SCDs in place   Objective: Vital Signs: Blood pressure 127/71, pulse 91, temperature 98.8 F (37.1 C), temperature source Oral, resp. rate 20, height 5\' 7"  (1.702 m), weight 83.008 kg (183 lb), SpO2 96.00%. No results found.  Basename 04/15/12 0650  WBC 11.5*  HGB 8.3*  HCT 24.5*  PLT 190   No results found for this basename: NA:2,K:2,CL:2,CO2:2,GLUCOSE:2,BUN:2,CREATININE:2,CALCIUM:2 in the last 72 hours CBG (last 3)  No results found for this basename: GLUCAP:3 in the last 72 hours  Wt Readings from Last 3 Encounters:  04/13/12 83.008 kg (183 lb)  04/04/12 79.6 kg (175 lb 7.8 oz)  04/04/12 79.6 kg (175 lb 7.8 oz)    Physical Exam:   General: Alert and oriented x 3, No apparent distress HEENT: Head is normocephalic, atraumatic, PERRLA, EOMI, sclera anicteric, oral mucosa pink and moist, dentition intact, ext ear canals clear,  Neck: Supple without JVD or lymphadenopathy Heart: Reg rate and rhythm. No murmurs rubs or gallops Chest: CTA bilaterally without wheezes, rales, or rhonchi; no distress Abdomen: Soft, non-tender, non-distended, bowel sounds positive. Extremities: No clubbing, cyanosis, or edema. Pulses are 2+ Skin: Clean and intact without signs of breakdown. Right great toe healing. Sutures out Neuro: Pt is cognitively appropriate with generally reasonable insight, memory, and awareness. Cranial nerves 2-12 are intact. Sensory exam is normal. Reflexes are 2+ in all 4's. Fine motor coordination is fair. No tremors. Motor function is grossly 4/5. Perhaps a little impulsive Musculoskeletal: Full ROM, mild pain in the low back with flexion and palpation. Posture appropriate  in bed Psych: Pt's affect is appropriate. Pt is cooperative    Assessment/Plan: 1. Functional deficits secondary to multiple myeloma, SDH which require 3+ hours per day of interdisciplinary therapy in a comprehensive inpatient rehab setting. Physiatrist is providing close team supervision and 24 hour management of active medical problems listed below. Physiatrist and rehab team continue to assess barriers to discharge/monitor patient progress toward functional and medical goals. FIM: FIM - Bathing Bathing Steps Patient Completed: Chest;Right Arm;Left Arm;Right lower leg (including foot);Front perineal area;Buttocks;Left lower leg (including foot);Abdomen;Right upper leg;Left upper leg Bathing: 4: Min-Patient completes 8-9 60f 10 parts or 75+ percent  FIM - Upper Body Dressing/Undressing Upper body dressing/undressing steps patient completed: Thread/unthread right sleeve of pullover shirt/dresss;Put head through opening of pull over shirt/dress;Thread/unthread left sleeve of pullover shirt/dress;Pull shirt over trunk Upper body dressing/undressing: 5: Supervision: Safety issues/verbal cues FIM - Lower Body Dressing/Undressing Lower body dressing/undressing steps patient completed: Thread/unthread right pants leg;Thread/unthread left pants leg;Pull pants up/down;Don/Doff right sock  FIM - Toileting Toileting steps completed by patient: Performs perineal hygiene;Adjust clothing after toileting Toileting: 3: Mod-Patient completed 2 of 3 steps  FIM - Diplomatic Services operational officer Devices: Grab bars Toilet Transfers: 5-To toilet/BSC: Supervision (verbal cues/safety issues);5-From toilet/BSC: Supervision (verbal cues/safety issues)  FIM - Bed/Chair Transfer Bed/Chair Transfer: 5: Chair or W/C > Bed: Supervision (verbal cues/safety issues);5: Bed > Chair or W/C: Supervision (verbal cues/safety issues)  FIM - Locomotion: Wheelchair Locomotion: Wheelchair: 1: Total Assistance/staff  pushes wheelchair (Pt<25%) FIM - Locomotion: Ambulation Ambulation/Gait Assistance: 4: Min assist Locomotion: Ambulation: 5: Travels 150 ft or more with supervision/safety issues  Comprehension Comprehension Mode:  Auditory Comprehension: 5-Understands basic 90% of the time/requires cueing < 10% of the time  Expression Expression Mode: Verbal Expression: 5-Expresses basic needs/ideas: With extra time/assistive device  Social Interaction Social Interaction: 6-Interacts appropriately with others with medication or extra time (anti-anxiety, antidepressant).  Problem Solving Problem Solving: 3-Solves basic 50 - 74% of the time/requires cueing 25 - 49% of the time  Memory Memory: 3-Recognizes or recalls 50 - 74% of the time/requires cueing 25 - 49% of the time Medical Problem List and Plan:  1. DVT Prophylaxis/Anticoagulation: Mechanical: Sequential compression devices, below knee Bilateral lower extremities  2. Pain Management: Pressure relief measures in conjunction with prn oxycodone effective. Her low back remains a source of pain at night and with activity.   -consider scheduling meds if pain becomes more of a problem-doing fairly well at present 3. Mood: Looks well. Dealing with her diagnosis realistically. Will have LCSW to follow up for formal evaluation.  4. Neuropsych: This patient is capable of making decisions on his/her own behalf.  5. Multiple Myeloma: Velcade on 3 wks then 1 week off with dexamethasone 40 mg/wk. Acyclovir to decrease risk for viral infection. Patient to follow up with Dr. Gaylyn Rong for initiation of Revlimid at later date.  -she asked me about her scheduled mammogram for august which i recommended she follow through with  6. Acute renal failure: resolved with hydration. Recheck bmet normal 7. Chronic constipation: continue current regimen. No problems at present  LOS (Days) 4 A FACE TO FACE EVALUATION WAS PERFORMED  Rogelia Boga 04/16/2012, 8:26 AM

## 2012-04-16 NOTE — Progress Notes (Signed)
Occupational Therapy Session Note  Patient Details  Name: KAITYLN KALLSTROM MRN: [id number] Date of Birth: [birth year]  Today's Date: 04/16/2012 Time: 0730-0822 Time Calculation (min): 52 min  Skilled Therapeutic Interventions/Progress Updates:  Toileting and ADL for periarea bathing on toilet in bathroom.  Rest of ADL in room at sink in w/c.  Patient very fatigued and c/o back pain but worked through it anyway.  Focus on endurance for LB b/d with rest breaks as needed.  Overall Min A with many rest breaks today.      Therapy Documentation Precautions:  Precautions Precautions: Fall Precaution Comments: no closed toe shoe on left foot- wound on greater left toe, has a special boot Restrictions Weight Bearing Restrictions: Yes Pain: See FIM for current functional status  Therapy/Group: Individual Therapy  Bud Face [facility] 04/16/2012, 3:28 PM

## 2012-04-17 ENCOUNTER — Inpatient Hospital Stay (HOSPITAL_COMMUNITY): Payer: Medicare Other | Admitting: *Deleted

## 2012-04-17 NOTE — Progress Notes (Signed)
Patient ID: [name], female   DOB: 1936-08-10, 76 y.o.   MRN: [id number] Patient ID: [name], female   DOB: 09/03/1936, 76 y.o.   MRN: [id number] Subjective/Complaints: 7/21.  Slept fairly well. No new complaints today.  A 12 point review of systems has been performed and if not noted above is otherwise negative.  Chest- clear; CV- nl heart sounds; Abd- benign; Extr- SCDs in place  CBG (last 3)  No results found for this basename: GLUCAP:3 in the last 72 hours   BP Readings from Last 3 Encounters:  04/17/12 131/81  04/12/12 142/82  04/12/12 142/82     Objective: Vital Signs: Blood pressure 131/81, pulse 82, temperature 97.5 F (36.4 C), temperature source Oral, resp. rate 20, height 5\' 7"  (1.702 m), weight 83.008 kg (183 lb), SpO2 96.00%. No results found.  Basename 04/15/12 0650  WBC 11.5*  HGB 8.3*  HCT 24.5*  PLT 190   No results found for this basename: NA:2,K:2,CL:2,CO2:2,GLUCOSE:2,BUN:2,CREATININE:2,CALCIUM:2 in the last 72 hours CBG (last 3)  No results found for this basename: GLUCAP:3 in the last 72 hours  Wt Readings from Last 3 Encounters:  04/13/12 83.008 kg (183 lb)  04/04/12 79.6 kg (175 lb 7.8 oz)  04/04/12 79.6 kg (175 lb 7.8 oz)    Physical Exam:   General: Alert and oriented x 3, No apparent distress HEENT: Head is normocephalic, atraumatic, PERRLA, EOMI, sclera anicteric, oral mucosa pink and moist, dentition intact, ext ear canals clear,  Neck: Supple without JVD or lymphadenopathy Heart: Reg rate and rhythm. No murmurs rubs or gallops Chest: CTA bilaterally without wheezes, rales, or rhonchi; no distress Abdomen: Soft, non-tender, non-distended, bowel sounds positive. Extremities: No clubbing, cyanosis, or edema. Pulses are 2+ Skin: Clean and intact without signs of breakdown. Right great toe healing. Sutures out Neuro: Pt is cognitively appropriate with generally reasonable insight, memory, and awareness. Cranial nerves 2-12 are  intact. Sensory exam is normal. Reflexes are 2+ in all 4's. Fine motor coordination is fair. No tremors. Motor function is grossly 4/5. Perhaps a little impulsive Musculoskeletal: Full ROM, mild pain in the low back with flexion and palpation. Posture appropriate in bed Psych: Pt's affect is appropriate. Pt is cooperative    Assessment/Plan: 1. Functional deficits secondary to multiple myeloma, SDH which require 3+ hours per day of interdisciplinary therapy in a comprehensive inpatient rehab setting. Physiatrist is providing close team supervision and 24 hour management of active medical problems listed below. Physiatrist and rehab team continue to assess barriers to discharge/monitor patient progress toward functional and medical goals. FIM: FIM - Bathing Bathing Steps Patient Completed: Chest;Right Arm;Left Arm;Abdomen;Front perineal area;Buttocks;Right upper leg;Left upper leg (periarea on toilet; otherwise in w/c at sink) Bathing: 5: Supervision: Safety issues/verbal cues  FIM - Upper Body Dressing/Undressing Upper body dressing/undressing steps patient completed: Thread/unthread right sleeve of pullover shirt/dresss;Thread/unthread left sleeve of pullover shirt/dress;Put head through opening of pull over shirt/dress;Pull shirt over trunk Upper body dressing/undressing: 5: Supervision: Safety issues/verbal cues FIM - Lower Body Dressing/Undressing Lower body dressing/undressing steps patient completed: Thread/unthread right underwear leg;Thread/unthread left underwear leg;Pull underwear up/down;Thread/unthread right pants leg;Thread/unthread left pants leg;Pull pants up/down;Don/Doff right sock;Don/Doff left sock (extra time and rest breaks required due to c/o fatigue) Lower body dressing/undressing: 5: Supervision: Safety issues/verbal cues  FIM - Toileting Toileting steps completed by patient: Adjust clothing prior to toileting;Performs perineal hygiene;Adjust clothing after  toileting Toileting: 4: Steadying assist  FIM - Diplomatic Services operational officer Devices: Grab bars;Art gallery manager  Transfers: 5-To toilet/BSC: Supervision (verbal cues/safety issues)  FIM - Bed/Chair Transfer Bed/Chair Transfer: 6: More than reasonable amt of time;5: Bed > Chair or W/C: Supervision (verbal cues/safety issues)  FIM - Locomotion: Wheelchair Locomotion: Wheelchair: 1: Total Assistance/staff pushes wheelchair (Pt<25%) FIM - Locomotion: Ambulation Ambulation/Gait Assistance: 4: Min assist Locomotion: Ambulation: 5: Travels 150 ft or more with supervision/safety issues  Comprehension Comprehension Mode: Auditory Comprehension: 4-Understands basic 75 - 89% of the time/requires cueing 10 - 24% of the time  Expression Expression Mode: Verbal Expression: 5-Expresses basic needs/ideas: With extra time/assistive device  Social Interaction Social Interaction: 6-Interacts appropriately with others with medication or extra time (anti-anxiety, antidepressant).  Problem Solving Problem Solving: 3-Solves basic 50 - 74% of the time/requires cueing 25 - 49% of the time  Memory Memory: 3-Recognizes or recalls 50 - 74% of the time/requires cueing 25 - 49% of the time Medical Problem List and Plan:  1. DVT Prophylaxis/Anticoagulation: Mechanical: Sequential compression devices, below knee Bilateral lower extremities  2. Pain Management: Pressure relief measures in conjunction with prn oxycodone effective. Her low back remains a source of pain at night and with activity.   -consider scheduling meds if pain becomes more of a problem-doing fairly well at present 3. Mood: Looks well. Dealing with her diagnosis realistically. Will have LCSW to follow up for formal evaluation.  4. Neuropsych: This patient is capable of making decisions on his/her own behalf.  5. Multiple Myeloma: Velcade on 3 wks then 1 week off with dexamethasone 40 mg/wk. Acyclovir to decrease risk for viral  infection. Patient to follow up with Dr. Gaylyn Rong for initiation of Revlimid at later date.  -she asked me about her scheduled mammogram for august which i recommended she follow through with  6. Acute renal failure: resolved with hydration. Recheck bmet normal 7. Chronic constipation: continue current regimen. No problems at present  LOS (Days) 5 A FACE TO FACE EVALUATION WAS PERFORMED  Rogelia Boga 04/17/2012, 7:55 AM

## 2012-04-17 NOTE — Progress Notes (Signed)
Physical Therapy Note  Patient Details  Name: [name] MRN: [id number] Date of Birth: Apr 13, 1936 Today's Date: 04/17/2012  1300-1355 (55 minutes) group Pain: 7/10 low back/premedicated Pt participated in PT group session focused on gait training/safety/endurance. Nustep (ergonometer) X 15 minutes Level 4 for general endurance/LE strengthening; gait 160 feet RW SBA X 2 . Pt reports RT knee instability/no instability noted during gait.   [name],JIM 04/17/2012, 1:30 PM

## 2012-04-18 ENCOUNTER — Inpatient Hospital Stay (HOSPITAL_COMMUNITY): Payer: Medicare Other | Admitting: Speech Pathology

## 2012-04-18 ENCOUNTER — Encounter (HOSPITAL_COMMUNITY): Payer: Medicare Other | Admitting: *Deleted

## 2012-04-18 ENCOUNTER — Inpatient Hospital Stay (HOSPITAL_COMMUNITY): Payer: Medicare Other | Admitting: Occupational Therapy

## 2012-04-18 ENCOUNTER — Telehealth: Payer: Self-pay | Admitting: *Deleted

## 2012-04-18 DIAGNOSIS — R5383 Other fatigue: Secondary | ICD-10-CM

## 2012-04-18 DIAGNOSIS — M25559 Pain in unspecified hip: Secondary | ICD-10-CM

## 2012-04-18 DIAGNOSIS — R110 Nausea: Secondary | ICD-10-CM

## 2012-04-18 MED FILL — Atorvastatin Calcium Tab 20 MG (Base Equivalent): ORAL | Qty: 1 | Status: AC

## 2012-04-18 MED FILL — Oxycodone HCl Tab 5 MG: ORAL | Qty: 2 | Status: AC

## 2012-04-18 MED FILL — Polyethylene Glycol 3350 Oral Packet 17 GM: ORAL | Qty: 1 | Status: AC

## 2012-04-18 MED FILL — Docusate Sodium Cap 100 MG: ORAL | Qty: 1 | Status: AC

## 2012-04-18 MED FILL — Chlorhexidine Gluconate Liquid 4%: CUTANEOUS | Qty: 15 | Status: AC

## 2012-04-18 NOTE — Progress Notes (Signed)
Occupational Therapy Session Note  Patient Details  Name: [name] MRN: [id number] Date of Birth: 22-Jul-1936  Today's Date: 04/18/2012 Time: 0815-0900 Time Calculation (min): 45 min  Short Term Goals: Week 1:  OT Short Term Goal 1 (Week 1): Pt will transfer to toilet wtih supervision with LRAD OT Short Term Goal 2 (Week 1): Pt will demonstrate selective attention with min A in a moderate distracting environment OT Short Term Goal 3 (Week 1): Pt will call for toileting needs 75% of time during day OT Short Term Goal 4 (Week 1): Pt wound don LB clothing with min A including shoes/ special foot boot  Skilled Therapeutic Interventions/Progress Updates:    1:1 Pt declined outing today due to not feeling well. Pt is still incontient of urine in brief (everytime I take her to the bathroom with decreased awareness until she pulls down her brief), functional mobility around room with RW requiring extra time for all sit to stands, and decreased standing/ activity tolerance; pt so distracted by pain requiring increased cuing to sequence and organize tasks related to "getting ready for the day."  Pt requested to lay down in bed after done and rest- "just not having a good day."  Therapy Documentation Precautions:  Precautions Precautions: Fall Precaution Comments: no closed toe shoe on left foot- wound on greater left toe, has a special boot Restrictions Weight Bearing Restrictions: No General: General Missed Time Reason: Pain;Patient fatigue (declined outing. see note) Pain: Increasing pain in back and HA- called for pain meds this am  See FIM for current functional status  Therapy/Group: Individual Therapy  R[facility] 04/18/2012, 1:26 PM

## 2012-04-18 NOTE — Telephone Encounter (Signed)
VM from P.A at [facility] In patient Rehab.  Reports pt in room #4009 and asks if pt needs to have Velcade tomorrow?  They will need to have an order.

## 2012-04-18 NOTE — Progress Notes (Signed)
Speech Language Pathology Daily Session Note  Patient Details  Name: [name] MRN: [id number] Date of Birth: 1935-11-22  Today's Date: 04/18/2012 Time: 0920-0950 Time Calculation (min): 30 min  Pt missed 30 minutes due to pain and fatigue   Short Term Goals: Week 1: SLP Short Term Goal 1 (Week 1): Pt will utilize external aids to recall new, daiily information with supervision question and semantic cues.  SLP Short Term Goal 2 (Week 1): Pt will demonstrate complex problem solving with supervision question and semantic cues.  SLP Short Term Goal 3 (Week 1): Pt will demonstrate alternating attention between tasks with supervision verbal ceus for redirection SLP Short Term Goal 4 (Week 1): Pt will demonstrate appropriate safety awareness and utilize call bell to express wants/needs with Mod I.   Skilled Therapeutic Interventions: Pt reporting fatigue and pain, pt pre-medicated. Treatment focus on recall of current medications and their function. Pt required Mod questioning and semantic cues to recall functions of familiar medications. Pt's fatigue and pain impacting overall cognitive function with tasks. Discussed importance of having 24 hour assistance at home to increase overall safety, pt agreed.    FIM:  Comprehension Comprehension Mode: Auditory Comprehension: 5-Understands basic 90% of the time/requires cueing < 10% of the time Expression Expression: 5-Expresses basic needs/ideas: With extra time/assistive device Social Interaction Social Interaction: 6-Interacts appropriately with others with medication or extra time (anti-anxiety, antidepressant). Problem Solving Problem Solving: 3-Solves basic 50 - 74% of the time/requires cueing 25 - 49% of the time Memory Memory: 3-Recognizes or recalls 50 - 74% of the time/requires cueing 25 - 49% of the time  Pain Pain Assessment Pain Score:   2 Pain Type: Acute pain Pain Location: Back Pain Descriptors: Aching;Discomfort Pain  Intervention(s): Medication (See eMAR)  Therapy/Group: Individual Therapy  Tevon Berhane 04/18/2012, 12:17 PM

## 2012-04-18 NOTE — Progress Notes (Signed)
Recreational Therapy Session Note  Patient Details  Name: [name] MRN: [id number] Date of Birth: April 02, 1936 Today's Date: 04/18/2012  Pt missed scheduled therapy outing- declined due to pain/fatigue.  Aprile Dickenson 04/18/2012, 3:39 PM

## 2012-04-18 NOTE — Progress Notes (Signed)
Physical Therapy Note  Patient Details  Name: [name] MRN: [id number] Date of Birth: [birth year] Today's Date: 04/18/2012  Pt missed scheduled therapy outing. Pt declined due to pain/fatigue. Pt aware of missing therapy time.    Karolee Stamps [facility] 04/18/2012, 11:51 AM

## 2012-04-18 NOTE — Progress Notes (Signed)
Subjective/Complaints: No problems over the weekend A 12 point review of systems has been performed and if not noted above is otherwise negative.  Chest- clear; CV- nl heart sounds; Abd- benign; Extr- SCDs in place  CBG (last 3)  No results found for this basename: GLUCAP:3 in the last 72 hours   BP Readings from Last 3 Encounters:  04/18/12 121/77  04/12/12 142/82  04/12/12 142/82     Objective: Vital Signs: Blood pressure 121/77, pulse 85, temperature 97.5 F (36.4 C), temperature source Oral, resp. rate 20, height 5\' 7"  (1.702 m), weight 83.008 kg (183 lb), SpO2 94.00%. No results found. No results found for this basename: WBC:2,HGB:2,HCT:2,PLT:2 in the last 72 hours No results found for this basename: NA:2,K:2,CL:2,CO2:2,GLUCOSE:2,BUN:2,CREATININE:2,CALCIUM:2 in the last 72 hours CBG (last 3)  No results found for this basename: GLUCAP:3 in the last 72 hours  Wt Readings from Last 3 Encounters:  04/13/12 83.008 kg (183 lb)  04/04/12 79.6 kg (175 lb 7.8 oz)  04/04/12 79.6 kg (175 lb 7.8 oz)    Physical Exam:   General: Alert and oriented x 3, No apparent distress HEENT: Head is normocephalic, atraumatic, PERRLA, EOMI, sclera anicteric, oral mucosa pink and moist, dentition intact, ext ear canals clear,  Neck: Supple without JVD or lymphadenopathy Heart: Reg rate and rhythm. No murmurs rubs or gallops Chest: CTA bilaterally without wheezes, rales, or rhonchi; no distress Abdomen: Soft, non-tender, non-distended, bowel sounds positive. Extremities: No clubbing, cyanosis, or edema. Pulses are 2+ Skin: Clean and intact without signs of breakdown. Right great toe healing. Sutures out Neuro: Pt is cognitively appropriate with generally reasonable insight, memory, and awareness. Cranial nerves 2-12 are intact. Sensory exam is normal. Reflexes are 2+ in all 4's. Fine motor coordination is fair. No tremors. Motor function is grossly 4/5. Perhaps a little  impulsive Musculoskeletal: Full ROM, mild pain in the low back with flexion and palpation. Posture appropriate in bed Psych: Pt's affect is appropriate. Pt is cooperative    Assessment/Plan: 1. Functional deficits secondary to multiple myeloma, SDH which require 3+ hours per day of interdisciplinary therapy in a comprehensive inpatient rehab setting. Physiatrist is providing close team supervision and 24 hour management of active medical problems listed below. Physiatrist and rehab team continue to assess barriers to discharge/monitor patient progress toward functional and medical goals. FIM: FIM - Bathing Bathing Steps Patient Completed:  (refused bath) Bathing:  (refused bath)  FIM - Upper Body Dressing/Undressing Upper body dressing/undressing steps patient completed: Thread/unthread right sleeve of pullover shirt/dresss;Thread/unthread left sleeve of pullover shirt/dress;Put head through opening of pull over shirt/dress;Pull shirt over trunk Upper body dressing/undressing: 0: Wears gown/pajamas-no public clothing FIM - Lower Body Dressing/Undressing Lower body dressing/undressing steps patient completed: Thread/unthread right underwear leg;Thread/unthread left underwear leg;Pull underwear up/down;Thread/unthread right pants leg;Thread/unthread left pants leg;Pull pants up/down;Don/Doff right sock;Don/Doff left sock (extra time and rest breaks required due to c/o fatigue) Lower body dressing/undressing: 0: Wears gown/pajamas-no public clothing  FIM - Toileting Toileting steps completed by patient: Adjust clothing prior to toileting;Performs perineal hygiene;Adjust clothing after toileting Toileting Assistive Devices: Grab bar or rail for support Toileting: 4: Steadying assist  FIM - Diplomatic Services operational officer Devices: Best boy Transfers: 5-To toilet/BSC: Supervision (verbal cues/safety issues);5-From toilet/BSC: Supervision (verbal cues/safety  issues)  FIM - Bed/Chair Transfer Bed/Chair Transfer Assistive Devices: Therapist, occupational: 5: Bed > Chair or W/C: Supervision (verbal cues/safety issues)  FIM - Locomotion: Wheelchair Locomotion: Wheelchair: 1: Total Assistance/staff pushes wheelchair (Pt<25%) FIM - Locomotion:  Ambulation Ambulation/Gait Assistance: 4: Min assist Locomotion: Ambulation: 5: Travels 150 ft or more with supervision/safety issues  Comprehension Comprehension Mode: Auditory Comprehension: 4-Understands basic 75 - 89% of the time/requires cueing 10 - 24% of the time  Expression Expression Mode: Verbal Expression: 5-Expresses basic needs/ideas: With extra time/assistive device  Social Interaction Social Interaction: 6-Interacts appropriately with others with medication or extra time (anti-anxiety, antidepressant).  Problem Solving Problem Solving: 3-Solves basic 50 - 74% of the time/requires cueing 25 - 49% of the time  Memory Memory: 3-Recognizes or recalls 50 - 74% of the time/requires cueing 25 - 49% of the time Medical Problem List and Plan:  1. DVT Prophylaxis/Anticoagulation: Mechanical: Sequential compression devices, below knee Bilateral lower extremities  2. Pain Management: Pressure relief measures in conjunction with prn oxycodone effective. Her low back remains a source of pain at night and with activity.   -consider scheduling meds if pain becomes more of a problem-doing fairly well at present 3. Mood: Looks well. Dealing with her diagnosis realistically. Will have LCSW to follow up for formal evaluation.  4. Neuropsych: This patient is capable of making decisions on his/her own behalf.  5. Multiple Myeloma: Velcade on 3 wks then 1 week off with dexamethasone 40 mg/wk. Acyclovir to decrease risk for viral infection. Patient to follow up with Dr. Gaylyn Rong for initiation of Revlimid at later date.  -she asked me about her scheduled mammogram for august which i recommended she follow through  with  6. Acute renal failure: resolved with hydration. Recheck bmet normal 7. Chronic constipation: continue current regimen. No problems at present  LOS (Days) 6 A FACE TO FACE EVALUATION WAS PERFORMED  Samreen Seltzer T 04/18/2012, 7:50 AM

## 2012-04-19 ENCOUNTER — Inpatient Hospital Stay (HOSPITAL_COMMUNITY): Payer: Medicare Other | Admitting: Speech Pathology

## 2012-04-19 ENCOUNTER — Encounter (HOSPITAL_COMMUNITY): Payer: Medicare Other | Admitting: Occupational Therapy

## 2012-04-19 ENCOUNTER — Telehealth: Payer: Self-pay | Admitting: *Deleted

## 2012-04-19 ENCOUNTER — Inpatient Hospital Stay (HOSPITAL_COMMUNITY): Payer: Medicare Other | Admitting: *Deleted

## 2012-04-19 MED ORDER — FENTANYL 12 MCG/HR TD PT72
12.5000 ug | MEDICATED_PATCH | TRANSDERMAL | Status: DC
  Administered 2012-04-19: 12.5 ug via TRANSDERMAL
  Filled 2012-04-19: qty 1

## 2012-04-19 MED ORDER — ONDANSETRON HCL 8 MG PO TABS
8.0000 mg | ORAL_TABLET | Freq: Once | ORAL | Status: AC
  Administered 2012-04-19: 8 mg via ORAL
  Filled 2012-04-19: qty 1

## 2012-04-19 MED ORDER — LIDOCAINE 5 % EX PTCH
2.0000 | MEDICATED_PATCH | CUTANEOUS | Status: DC
  Administered 2012-04-19 – 2012-04-21 (×3): 2 via TRANSDERMAL
  Filled 2012-04-19 (×5): qty 2

## 2012-04-19 MED ORDER — BORTEZOMIB CHEMO SQ INJECTION 3.5 MG (2.5MG/ML)
1.3000 mg/m2 | Freq: Once | INTRAMUSCULAR | Status: AC
  Administered 2012-04-19: 2.5 mg via SUBCUTANEOUS
  Filled 2012-04-19 (×2): qty 1

## 2012-04-19 NOTE — Progress Notes (Signed)
Hgb reports relayed to Dr. Lodema Pilot office.  Received Ok to proceed with Velcade.

## 2012-04-19 NOTE — Progress Notes (Signed)
Physical Therapy Session Note  Patient Details  Name: [name] MRN: [id number] Date of Birth: Aug 11, 1936  Today's Date: 04/19/2012 Time: 1020-1113 Time Calculation (min): 53 min  Short Term Goals: See d/c goals  Skilled Therapeutic Interventions/Progress Updates:    Community gait training with RW- pt still wearing orthopedic shoe on L from toenail surgery two months ago, PA and PT agree pt should wear regular shoe from now on as it is healed and then her leg length will be even, pt verbalizes agreement and shoe located at end of session for next session. Also gait in tighter spaces in gift shop.  Pt c/o pain in back, posterior R hip that worsens with sitting and activity. Nurse informed and aware, pt premedicated and assisted back to bed in supine for pain management at end of session.  Pt requires cues for even basic problem solving and increased time for all mobility- question slow to initiate vs slow d/t anticipating pain.   Discussed beneifts of walking for cancer and chemo related fatigue and possible chemo related side effects to watch for and inform MD should they occur. Therapy Documentation Precautions:  Precautions Precautions: Fall Precaution Comments: no closed toe shoe on left foot- wound on greater left toe, has a special boot Restrictions Weight Bearing Restrictions: No Locomotion : Ambulation Ambulation/Gait Assistance: 5: Supervision Ambulation Distance (Feet): 100 Feet (x3) Ambulation/Gait Assistance Details: in community, uneven brick, inclines and declines without LOB, increased time  See FIM for current functional status  Therapy/Group: Individual Therapy  Michaelene Song 04/19/2012, 11:36 AM

## 2012-04-19 NOTE — Telephone Encounter (Signed)
PT.'S REVLIMID HAS BEEN APPROVED. WILL PT. START THIS MEDICATION WHILE SHE IS ON THE REHAB UNIT? [employer] WILL NEED TO BE CALLED WITH THE DATE TO SHIP THE REVLIMID. THIS NOTE TO DR.HA'S ACTIVE WORK FOLDER.

## 2012-04-19 NOTE — Consult Note (Signed)
[facility] INPATIENT PROGRESS NOTE  Name: [name]      MRN: [id number]    Location: 4009/4009-01  Date: 04/19/2012 Time:8:03 AM   Subjective: Interval History:[name] reported feeling tired today.  Up until today, she has been ambulating around the ward.  She still has mild bilateral hip pain especially in the morning when she first wakes up and after the end of each PT session.  The bilateral hip pain improves with oral pain med.  The pain is not as severe as when she first presented a month ago.  Today, she developed some nausea but not vomiting and did not participate in therapy.  Otherwise, she denied fever, mucositis, SOB, chest pain, abdominal pain, bleeding symptoms, diarrhea, constipation, skin rash.   Objective: Vital signs in last 24 hours: Temp:  [98.1 F (36.7 C)-98.5 F (36.9 C)] 98.5 F (36.9 C) (07/23 0552) Pulse Rate:  [82-85] 85  (07/23 0552) Resp:  [18-19] 19  (07/23 0552) BP: (120-128)/(76-78) 128/78 mmHg (07/23 0552) SpO2:  [95 %-96 %] 95 % (07/23 0552)     PHYSICAL EXAM: Gen: slightly obese woman, in no acute distress. Eyes: No scleral icterus or jaundice. ENT: There was no oropharyngeal lesions. Neck was supple without thyromegaly. Lymphatics: Negative for cervical, supraclavicular, axillary, or inguinal adenopathy. Respiratory: Lungs were clear bilaterally without wheezing or crackles. Cardiovascular: normal heart rate and rhythm; S1/S2; without murmur, rubs, or gallop. There was no pedal edema. GI: Abdomen was soft, flat, nontender, nondistended, without organomegaly. Musculoskeletal exam: No spinal tenderness on palpation of vertebral spine. Skin exam was without ecchymosis, petechiae.   Studies/Results: from 04/15/12 WBC 11.5; Hgb 8.3; Plt 190 Cr 0.99; Ca 10.1.    MEDICATIONS: reviewed.   PROBLEM LISTS:  1. Hypertension  2. Hyperlipidemia.  3. Acute renal failure due to hypercalcemia and suspected multiple myeloma: resolved with  IV fluid 4. Hypercalcemia: Due to suspected multiple myeloma: resolved with IVF and pamidronate last week.  5. Moderate deconditioning due to bone pain:  On inpatient rehab.  6. Lytic bone lesions causing bone pain: From suspected multiple myeloma: under control with pain med; s/p pamidronate last week.  Pamidronate is monthly; next dose is due 05/10/12.  7. Acute subdural hematoma from recent fall:  Resolved.  8.  Multiple myeloma:  S/p 1st dose of weekly Velcade SQ/Dexamethasone PO on 04/12/2012  RECCOMMENDATIONS:  - Proceed with 2nd weekly dose of weekly Velcade SQ/Dexamethasone PO tomorrow 04/19/2012.  - Oral chemo Revlimid is being processed, and will be delivered to patient's home.  I will instruct patient on when to start this in addition to weekly Velcade SQ/Dexamethasone PO.

## 2012-04-19 NOTE — Progress Notes (Signed)
Physical Therapy Session Note  Patient Details  Name: [name] MRN: [id number] Date of Birth: 08-Jan-1936  Today's Date: 04/19/2012 Time: 13:05-14:00 ( )   Skilled Therapeutic Interventions/Progress Updates:  Tx focused on gait training with RW, and LE strengthening to improve functional mobility. Attempted standing balance tasks, but pt in considerable pain this afternoon. Pt with 8/10 pain upon arrival in bed, and progressed during walk to gym. Attempted seated ex on Nustep, but not comfortable position for pt either, thus stopped this activity early. Progressed tx with supine therex, which was more appropriate for pt this afternoon. Pt states she "over did it this morning."  Supine>sit with S from bed with bed rails with half-roll to R, then up to sit, and increased time required, taking pt to complete task to EOB due to managing pain. Pt reported it takes her so long because she is anticipating the pain, but with encouragement can keep moving when pain is not actually present. Supine<>sit on mat needing Min A for trunk lifting to sit and LE lifting to supine.  Sit<>stand x5 with min-guard and increased time, approx per transfer due to preparation and pain. Pt needing cues each time for safe hand placement.  Stand-step transfers with close S x4 and cues for technique.  Gait 1x 80' and 1x40' with close S and seated rest break needed between. Pt's gait unsteady with decreased foot clearance bilaterally. Pt in too much pain to continue standing tasks this afternoon.  Nustep x44min, level 3 with LE only for strengthening extensors, limited by hip pain.  Supine therex 2x10 of each bil: glut sets, hip ADD ball squeeze, SAQ with 2# ankle wt.   Therapy Documentation    Pain: 8/10 pain at start of session, pain meds given, adjusted activity as needed  See FIM for current functional status  Therapy/Group: Individual Therapy  Virl Cagey, PT 04/19/2012, 1:35 PM

## 2012-04-19 NOTE — Plan of Care (Signed)
Problem: RH BLADDER ELIMINATION Goal: RH STG MANAGE BLADDER WITH ASSISTANCE STG Manage Bladder With min Assistance  Outcome: Not Progressing Urgency and incont episodes, especially at HS

## 2012-04-19 NOTE — Plan of Care (Signed)
Problem: RH PAIN MANAGEMENT Goal: RH STG PAIN MANAGED AT OR BELOW PT'S PAIN GOAL 2/10  Outcome: Not Progressing Complaining of increasing back pain x2 days. Fentanyl and lidoderm patches added today.

## 2012-04-19 NOTE — Progress Notes (Signed)
Speech Language Pathology Daily Session Note  Patient Details  Name: [name] MRN: [id number] Date of Birth: 04/03/1936  Today's Date: 04/19/2012 Time: 1415-1440 Time Calculation (min): 25 min  Short Term Goals: Week 1: SLP Short Term Goal 1 (Week 1): Pt will utilize external aids to recall new, daiily information with supervision question and semantic cues.  SLP Short Term Goal 2 (Week 1): Pt will demonstrate complex problem solving with supervision question and semantic cues.  SLP Short Term Goal 3 (Week 1): Pt will demonstrate alternating attention between tasks with supervision verbal ceus for redirection SLP Short Term Goal 4 (Week 1): Pt will demonstrate appropriate safety awareness and utilize call bell to express wants/needs with Mod I.   Skilled Therapeutic Interventions: Treatment focus on family education with pt's husband in regards to current cognitive function and level of cognitive assistance pt will need at home in regards to medication and money management and working memory.  Pt and husband verbalized understanding.    FIM:  Comprehension Comprehension Mode: Auditory Comprehension: 5-Understands complex 90% of the time/Cues < 10% of the time Expression Expression Mode: Verbal Expression: 5-Expresses complex 90% of the time/cues < 10% of the time Social Interaction Social Interaction: 6-Interacts appropriately with others with medication or extra time (anti-anxiety, antidepressant). Problem Solving Problem Solving: 4-Solves basic 75 - 89% of the time/requires cueing 10 - 24% of the time Memory Memory: 4-Recognizes or recalls 75 - 89% of the time/requires cueing 10 - 24% of the time  Pain No/Denies Pain  Therapy/Group: Individual Therapy  Tedrick Port 04/19/2012, 5:32 PM

## 2012-04-19 NOTE — Progress Notes (Signed)
Recreational Therapy Discharge Summary Patient Details  Name: [name] MRN: [id number] Date of Birth: July 28, 1936 Today's Date: 04/19/2012  Long term goals set: 1  Long term goals met: 0  Comments on progress toward goals: Pt referred by team for community reintegration and pt agreeable.  However, pt unable to participate in scheduled outing due to fatigue, pain, and nausea.  Pt being discharged from TR services due to low activity tolerance.  Patient/family agrees with progress made and goals achieved: Yes  Jazlyne Gauger 04/19/2012, 8:45 AM

## 2012-04-19 NOTE — Progress Notes (Signed)
Occupational Therapy Session Note  Patient Details  Name: [name] MRN: [id number] Date of Birth: 1936/09/13  Today's Date: 04/19/2012 Time: 7829-5621 Time Calculation (min): 45 min  Short Term Goals: Week 1:  OT Short Term Goal 1 (Week 1): Pt will transfer to toilet wtih supervision with LRAD OT Short Term Goal 2 (Week 1): Pt will demonstrate selective attention with min A in a moderate distracting environment OT Short Term Goal 3 (Week 1): Pt will call for toileting needs 75% of time during day OT Short Term Goal 4 (Week 1): Pt wound don LB clothing with min A including shoes/ special foot boot  Skilled Therapeutic Interventions/Progress Updates:    1:1 self care retraining at shower level- performed shower standing holding onto grab bar due to increasing back pain and trouble sitting, toileting with trouble pulling pants all the way down due to pain, pt also needed max to total A for LB dressing today secondary to back pain today unable to tolerate bending/ crossing legs but also declined using a reacher- increased frustration with self  Therapy Documentation Precautions:  Precautions Precautions: Fall Precaution Comments: no closed toe shoe on left foot- wound on greater left toe, has a special boot Restrictions Weight Bearing Restrictions: No Pain:  increasing back pain resulting in nausea 8/10 pain in back  See FIM for current functional status  Therapy/Group: Individual Therapy  Roney Mans [facility] 04/19/2012, 8:08 AM

## 2012-04-19 NOTE — Telephone Encounter (Signed)
PHARMACY STATES PT.'S HGB NEEDS TO BE 8.5 IN ORDER TO GIVE VELCADE. VERBAL ORDER AND READ BACK TO KRISTIN CURICO,NP- GIVE THE VELCADE. NOTIFIED PAMELA LOVE. SHE VOICES UNDERSTANDING.

## 2012-04-19 NOTE — Progress Notes (Signed)
Physical Therapy Note  Patient Details  Name: VENISA FRAMPTON MRN: [id number] Date of Birth: 13-Oct-1935 Today's Date: 04/19/2012  Pt's husband and son educated on benefits of walking to fight cancer related fatigue and to monitor for possible side effects from chemo, including peripheral neuropathy that could further affect balance,  and inform MD of any noted. Both verbalized understanding.    Michaelene Song 04/19/2012, 1:54 PM

## 2012-04-19 NOTE — Care Management Note (Signed)
Per State Regulation 482.30 This chart was reviewed for medical necessity with respect to the patient's Admission/Duration of stay. Pain management.  Participating txs.   Brock Ra                 Nurse Care Manager              Next Review Date: 04/22/12

## 2012-04-19 NOTE — Progress Notes (Signed)
Recreational Therapy Session Note  Patient Details  Name: [name] MRN: [id number] Date of Birth: [birth year] Today's Date: 04/19/2012 Time:  830-840  Skilled Therapeutic Interventions/Progress Updates: upon entering, pt stating c/o hip pain and fatigue from previous therapy session.  Dicussed variable activity tolerance and how that might her effect her activities post discharge.    Bonney Berres 04/19/2012, 8:40 AM

## 2012-04-19 NOTE — Progress Notes (Addendum)
Patient ID: [name], female   DOB: 1935-11-03, 76 y.o.   MRN: [id number] Subjective/Complaints: Increasing back pain reported by patient and team. A 12 point review of systems has been performed and if not noted above is otherwise negative.  Chest- clear; CV- nl heart sounds; Abd- benign; Extr- SCDs in place  CBG (last 3)  No results found for this basename: GLUCAP:3 in the last 72 hours   BP Readings from Last 3 Encounters:  04/19/12 128/78  04/12/12 142/82  04/12/12 142/82     Objective: Vital Signs: Blood pressure 128/78, pulse 85, temperature 98.5 F (36.9 C), temperature source Oral, resp. rate 19, height 5\' 7"  (1.702 m), weight 83.008 kg (183 lb), SpO2 95.00%. No results found. No results found for this basename: WBC:2,HGB:2,HCT:2,PLT:2 in the last 72 hours No results found for this basename: NA:2,K:2,CL:2,CO2:2,GLUCOSE:2,BUN:2,CREATININE:2,CALCIUM:2 in the last 72 hours CBG (last 3)  No results found for this basename: GLUCAP:3 in the last 72 hours  Wt Readings from Last 3 Encounters:  04/13/12 83.008 kg (183 lb)  04/04/12 79.6 kg (175 lb 7.8 oz)  04/04/12 79.6 kg (175 lb 7.8 oz)    Physical Exam:   General: Alert and oriented x 3, No apparent distress. Urine has strong odor HEENT: Head is normocephalic, atraumatic, PERRLA, EOMI, sclera anicteric, oral mucosa pink and moist, dentition intact, ext ear canals clear,  Neck: Supple without JVD or lymphadenopathy Heart: Reg rate and rhythm. No murmurs rubs or gallops Chest: CTA bilaterally without wheezes, rales, or rhonchi; no distress Abdomen: Soft, non-tender, non-distended, bowel sounds positive. Extremities: No clubbing, cyanosis, or edema. Pulses are 2+ Skin: Clean and intact without signs of breakdown. Right great toe healing. Sutures out Neuro: Pt is cognitively appropriate with generally reasonable insight, memory, and awareness. Cranial nerves 2-12 are intact. Sensory exam is normal. Reflexes are 2+ in  all 4's. Fine motor coordination is fair. No tremors. Motor function is grossly 4/5. Perhaps a little impulsive Musculoskeletal: Full ROM, mild pain in the low back with flexion and palpation. Posture appropriate in bed Psych: Pt's affect is appropriate. Pt is cooperative    Assessment/Plan: 1. Functional deficits secondary to multiple myeloma, SDH which require 3+ hours per day of interdisciplinary therapy in a comprehensive inpatient rehab setting. Physiatrist is providing close team supervision and 24 hour management of active medical problems listed below. Physiatrist and rehab team continue to assess barriers to discharge/monitor patient progress toward functional and medical goals. FIM: FIM - Bathing Bathing Steps Patient Completed:  (refused bath) Bathing:  (refused bath)  FIM - Upper Body Dressing/Undressing Upper body dressing/undressing steps patient completed: Thread/unthread right sleeve of pullover shirt/dresss;Thread/unthread left sleeve of pullover shirt/dress;Put head through opening of pull over shirt/dress;Pull shirt over trunk Upper body dressing/undressing: 0: Wears gown/pajamas-no public clothing FIM - Lower Body Dressing/Undressing Lower body dressing/undressing steps patient completed: Thread/unthread right underwear leg;Thread/unthread left underwear leg;Pull underwear up/down;Thread/unthread right pants leg;Thread/unthread left pants leg;Pull pants up/down;Don/Doff right sock;Don/Doff left sock (extra time and rest breaks required due to c/o fatigue) Lower body dressing/undressing: 0: Wears gown/pajamas-no public clothing  FIM - Toileting Toileting steps completed by patient: Adjust clothing prior to toileting;Performs perineal hygiene;Adjust clothing after toileting Toileting Assistive Devices: Grab bar or rail for support Toileting: 4: Steadying assist  FIM - Diplomatic Services operational officer Devices: Best boy Transfers: 5-To  toilet/BSC: Supervision (verbal cues/safety issues);5-From toilet/BSC: Supervision (verbal cues/safety issues)  FIM - Press photographer Assistive Devices: Manufacturing systems engineer Transfer: 5: Bed >  Chair or W/C: Supervision (verbal cues/safety issues)  FIM - Locomotion: Wheelchair Locomotion: Wheelchair: 1: Total Assistance/staff pushes wheelchair (Pt<25%) FIM - Locomotion: Ambulation Ambulation/Gait Assistance: 4: Min assist Locomotion: Ambulation: 5: Travels 150 ft or more with supervision/safety issues  Comprehension Comprehension Mode: Auditory Comprehension: 5-Understands complex 90% of the time/Cues < 10% of the time  Expression Expression Mode: Verbal Expression: 5-Expresses basic needs/ideas: With no assist  Social Interaction Social Interaction: 6-Interacts appropriately with others with medication or extra time (anti-anxiety, antidepressant).  Problem Solving Problem Solving: 5-Solves basic problems: With no assist  Memory Memory: 3-Recognizes or recalls 50 - 74% of the time/requires cueing 25 - 49% of the time Medical Problem List and Plan:  1. DVT Prophylaxis/Anticoagulation: Mechanical: Sequential compression devices, below knee Bilateral lower extremities  2. Pain Management: Pressure relief measures in conjunction with prn oxycodone effective. Her low back remains a source of pain at night and with activity.   -consider scheduling meds if pain becomes more of a problem-doing fairly well at present  -add fentanyl patch as well as lidoderm patches 3. Mood: Looks well. Dealing with her diagnosis realistically. Will have LCSW to follow up for formal evaluation.  4. Neuropsych: This patient is capable of making decisions on his/her own behalf.  5. Multiple Myeloma: Velcade on 3 wks then 1 week off with dexamethasone 40 mg/wk. Acyclovir to decrease risk for viral infection. Patient to follow up with Dr. Gaylyn Rong for initiation of Revlimid at later date. 6.  Acute renal failure: resolved with hydration. Recheck bmet normal 7. Chronic constipation: continue current regimen. No problems at present 8. Check ua and culture  LOS (Days) 7 A FACE TO FACE EVALUATION WAS PERFORMED  SWARTZ,ZACHARY T 04/19/2012, 8:08 AM

## 2012-04-19 NOTE — Patient Care Conference (Signed)
Inpatient RehabilitationTeam Conference Note Date: 04/19/2012   Time: 3:00 PM    Patient Name: [name]      Medical Record Number: [id number]  Date of Birth: 02-Jan-1936 Sex: Female         Room/Bed: 4009/4009-01 Payor Info: Payor: MEDICARE  Plan: MEDICARE PART A AND B  Product Type: *No Product type*     Admitting Diagnosis: MULTIPLE MYELOMA  Admit Date/Time:  04/12/2012  5:01 PM Admission Comments: No comment available   Primary Diagnosis:  SDH (subdural hematoma) Principal Problem: SDH (subdural hematoma)  Patient Active Problem List   Diagnosis Date Noted  . SDH (subdural hematoma) 04/13/2012  . Multiple myeloma 04/08/2012  . Lytic lesion of bone on x-ray 04/08/2012  . Constipation due to slow transit/hypercalcemia 04/08/2012  . 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 Acute subdural hematoma 04/06/2012  . Dehydration 04/04/2012  . Acute renal failure 04/04/2012  . Hypercalcemia 04/04/2012  . Hip pain 04/04/2012  . Anemia 04/04/2012  . Obesity 03/03/2012  . BACK PAIN, LUMBAR 11/05/2009  . OSTEOPENIA 11/05/2008  . POSTMENOPAUSAL STATUS 11/05/2008  . DEPRESSION 04/17/2008  . BREAST MASS, RIGHT 10/24/2006  . HYPERLIPIDEMIA NEC/NOS 10/19/2006  . ANXIETY 10/19/2006  . HYPERTENSION 10/19/2006  . INSOMNIA 10/19/2006  . HYPERGLYCEMIA 10/19/2006    Expected Discharge Date: Expected Discharge Date: 04/21/12  Team Members Present: Physician: Dr. Faith Rogue Social Worker Present: Amada Jupiter, LCSW Nurse Present: Carlean Purl, RN PT Present: Other (comment) Sherrine Maples, PT) OT Present: Roney Mans, OT SLP Present: Feliberto Gottron, SLP Other (Discipline and Name): Tora Duck, PPS Coordinator     Current Status/Progress Goal Weekly Team Focus  Medical   multiple myeloma, sdh,  pain mgt issues, coping  with dx   pain mgt, adaptive strategies   Bowel/Bladder   Occasional incontinence urine d/t  urgency;  Timed toileting q3hrs.  continent B/B  with timed toilleting.  Monitor, timed toilet.   Swallow/Nutrition/ Hydration             ADL's   varying level of A for LB dressing due to pain (overall can be supervision when pain is down)  supervision  d/c planning, organization of task   Mobility   supervision  mod I-supervision  family ed, d/c planning   Communication             Safety/Cognition/ Behavioral Observations  Mod A  Min A  Family edu   Pain   Pt. c/o increased lower back pain; R>Lt side;   managed 2/10  Monitor, offer PRNs, reposition, frequent rest breaks   Skin   buttocks with red area, blanchable  resolving at D/C  [facility], check frequently for incontient episodes      *See Interdisciplinary Assessment and Plan and progress notes for long and short-term goals  Barriers to Discharge: pain, oncological issues    Possible Resolutions to Barriers:  pacing,     Discharge Planning/Teaching Needs:  Pt to return home with husband who is retired and states he is prepared to provide 24/7 assistance, however, pt reluctant to have husband commit to this "...he needs to be able to get out some".  Will discuss further with both after team conference.      Team Discussion:  Tearful today about future and apologetic to team members for not having much strength.  Family will need to assist with medications, household management as fatigue and pain affecting cognition.  Revisions to Treatment Plan:  none   Continued Need  for Acute Rehabilitation Level of Care: The patient requires daily medical management by a physician with specialized training in physical medicine and rehabilitation for the following conditions: Daily direction of a multidisciplinary physical rehabilitation program to ensure safe treatment while eliciting the highest outcome that is of practical value to the patient.: Yes Daily medical management of patient stability for increased activity during participation in an intensive rehabilitation regime.:  Yes Daily analysis of laboratory values and/or radiology reports with any subsequent need for medication adjustment of medical intervention for : Neurological problems;Other  Colm Lyford 04/19/2012, 5:17 PM

## 2012-04-20 ENCOUNTER — Other Ambulatory Visit: Payer: Self-pay | Admitting: *Deleted

## 2012-04-20 ENCOUNTER — Other Ambulatory Visit: Payer: Self-pay | Admitting: Certified Registered Nurse Anesthetist

## 2012-04-20 ENCOUNTER — Telehealth: Payer: Self-pay | Admitting: *Deleted

## 2012-04-20 ENCOUNTER — Other Ambulatory Visit: Payer: Self-pay | Admitting: Oncology

## 2012-04-20 ENCOUNTER — Inpatient Hospital Stay (HOSPITAL_COMMUNITY): Payer: Medicare Other | Admitting: Physical Therapy

## 2012-04-20 ENCOUNTER — Inpatient Hospital Stay (HOSPITAL_COMMUNITY): Payer: Medicare Other | Admitting: Speech Pathology

## 2012-04-20 ENCOUNTER — Inpatient Hospital Stay (HOSPITAL_COMMUNITY): Payer: Medicare Other | Admitting: Occupational Therapy

## 2012-04-20 DIAGNOSIS — C9000 Multiple myeloma not having achieved remission: Secondary | ICD-10-CM

## 2012-04-20 DIAGNOSIS — W19XXXA Unspecified fall, initial encounter: Secondary | ICD-10-CM

## 2012-04-20 DIAGNOSIS — S069X9A Unspecified intracranial injury with loss of consciousness of unspecified duration, initial encounter: Secondary | ICD-10-CM

## 2012-04-20 DIAGNOSIS — Z5189 Encounter for other specified aftercare: Secondary | ICD-10-CM

## 2012-04-20 LAB — URINE MICROSCOPIC-ADD ON

## 2012-04-20 LAB — BASIC METABOLIC PANEL
GFR calc Af Amer: 49 mL/min — ABNORMAL LOW (ref >90)
GFR calc non Af Amer: 43 mL/min — ABNORMAL LOW (ref >90)
Potassium: 5.4 mEq/L — ABNORMAL HIGH (ref 3.5–5.1)
Sodium: 135 mEq/L (ref 135–145)

## 2012-04-20 LAB — URINALYSIS, ROUTINE W REFLEX MICROSCOPIC
Bilirubin Urine: NEGATIVE
Glucose, UA: NEGATIVE mg/dL
Specific Gravity, Urine: 1.017 (ref 1.005–1.030)
pH: 5.5 (ref 5.0–8.0)

## 2012-04-20 LAB — CBC
Hemoglobin: 8.4 g/dL — ABNORMAL LOW (ref 12.0–15.0)
MCHC: 33.2 g/dL (ref 30.0–36.0)
RDW: 14.4 % (ref 11.5–15.5)

## 2012-04-20 MED ORDER — SODIUM CHLORIDE 0.9 % IV SOLN: INTRAVENOUS | Status: AC

## 2012-04-20 MED ORDER — LIDOCAINE 5 % EX PTCH: 2.0000 | MEDICATED_PATCH | CUTANEOUS | Status: AC

## 2012-04-20 MED ORDER — ACYCLOVIR 200 MG PO CAPS: 400.0000 mg | ORAL_CAPSULE | Freq: Every day | ORAL | Status: AC

## 2012-04-20 MED ORDER — DSS 100 MG PO CAPS: 100.0000 mg | ORAL_CAPSULE | Freq: Two times a day (BID) | ORAL | Status: AC

## 2012-04-20 MED ORDER — OXYCODONE HCL 5 MG PO TABS
5.0000 mg | ORAL_TABLET | ORAL | Status: DC | PRN
  Administered 2012-04-20 – 2012-04-21 (×5): 10 mg via ORAL
  Filled 2012-04-20 (×5): qty 2

## 2012-04-20 MED ORDER — POLYETHYLENE GLYCOL 3350 17 G PO PACK: 17.0000 g | PACK | Freq: Two times a day (BID) | ORAL | Status: AC

## 2012-04-20 MED ORDER — LENALIDOMIDE 15 MG PO CAPS: ORAL_CAPSULE | ORAL | Status: DC

## 2012-04-20 MED ORDER — FENTANYL 12 MCG/HR TD PT72: 1.0000 | MEDICATED_PATCH | TRANSDERMAL | Status: DC

## 2012-04-20 MED ORDER — OXYCODONE HCL 5 MG PO TABS: 5.0000 mg | ORAL_TABLET | ORAL | Status: AC | PRN

## 2012-04-20 MED ORDER — DEXAMETHASONE 4 MG PO TABS: ORAL_TABLET | ORAL | Status: DC

## 2012-04-20 NOTE — Progress Notes (Signed)
Physical Therapy Discharge Summary  Patient Details  Name: [name] MRN: [id number] Date of Birth: [birth year]  Today's Date: 04/20/2012  Patient has met 7 of 8 long term goals due to improved activity tolerance, improved balance, improved postural control, increased strength, ability to compensate for deficits, improved attention and improved awareness.  Patient to discharge at an ambulatory level Supervision.   Patient's care partner is independent to provide the necessary physical and cognitive assistance at discharge.  Reasons goals not met: pt with decreased endurance, unable to gait 150'  Recommendation:  Patient will benefit from ongoing skilled PT services in home health setting to continue to advance safe functional mobility, address ongoing impairments in balance, activity tolerance, strength, gait, and minimize fall risk.  Equipment: RW, w/c, sliding board  Reasons for discharge: treatment goals met and discharge from hospital  Patient/family agrees with progress made and goals achieved: Yes  PT Discharge Precautions/Restrictions Precautions Precautions: Fall  Cognition Overall Cognitive Status: Impaired Arousal/Alertness: Awake/alert Orientation Level: Oriented X4 Attention: Divided Selective Attention: Appears intact Divided Attention: Impaired Divided Attention Impairment: Verbal basic;Functional basic Memory: Impaired Memory Impairment: Decreased recall of new information;Decreased short term memory Decreased Short Term Memory: Verbal basic;Functional basic Awareness: Impaired Awareness Impairment: Anticipatory impairment Safety/Judgment: Impaired Sensation Sensation Light Touch: Appears Intact Stereognosis: Appears Intact Hot/Cold: Appears Intact Proprioception: Appears Intact Coordination Gross Motor Movements are Fluid and Coordinated: Yes Fine Motor Movements are Fluid and Coordinated: Yes Motor  Motor Motor - Discharge Observations:  generalized weakness  Trunk/Postural Assessment  Cervical Assessment Cervical Assessment: Within Functional Limits Thoracic Assessment Thoracic Assessment: Within Functional Limits Lumbar Assessment Lumbar Assessment:  (limited by pain) Postural Control Postural Control:  (generalized weakness, limited by pain)  Balance Static Standing Balance Static Standing - Balance Support: During functional activity Static Standing - Level of Assistance: 5: Stand by assistance Dynamic Standing Balance Dynamic Standing - Balance Support: During functional activity Dynamic Standing - Level of Assistance: 5: Stand by assistance Extremity Assessment  RUE Assessment RUE Assessment: Within Functional Limits LUE Assessment LUE Assessment: Within Functional Limits RLE Assessment RLE Assessment: Within Functional Limits LLE Assessment LLE Assessment: Within Functional Limits  See FIM for current functional status  [name] 04/20/2012, 12:25 PM

## 2012-04-20 NOTE — Progress Notes (Signed)
Occupational Therapy Discharge Summary  Patient Details  Name: [name] MRN: [id number] Date of Birth: 1936/08/27  Today's Date: 04/20/2012 Time: 0915-1010 Time Calculation (min): 55 min  Patient has met 12 of 13 long term goals due to improved balance, postural control, functional use of  RIGHT lower, LEFT upper and LEFT lower extremity, improved attention, improved awareness and improved coordination.  Patient to discharge at overall supervision to min A level for basic ADLs and basic functional ambulation and mobility with a RW.  Pt's activity tolerance and ability to carry out basic tasks has been greatly impacted by her pain in hips and back, requiring rest breaks often and assistance with LB bathing and dressing. Pt's husband and son have completed family education and have agreed to provide 24 hr care for her.   Patient's care partner is independent to provide the necessary physical and cognitive assistance at discharge.    Reasons goals not met:Pt's ability to perform dressing varies depending on pain and her energy level, resulting in needing assistance for LB dressing (more often than not). Husband able to provide the care she needs.  Recommendation:  Patient will benefit from ongoing skilled OT services in home health setting to continue to advance functional skills in the area of BADL, Reduce care partner burden and activity level .  Equipment: 3:1, w/c with cushion, RW  Reasons for discharge: treatment goals met and discharge from hospital  Patient/family agrees with progress made and goals achieved: Yes  OT Discharge Precautions/Restrictions  Precautions Precautions: Fall Pain Pain Assessment Pain Assessment: 0-10 Pain Score:   8 Pain Type: Acute pain Pain Location: Back Pain Descriptors: Aching Pain Frequency: Intermittent Pain Intervention(s): Medication (See eMAR) ADL  see FIM Vision/Perception  Vision - History Baseline Vision: No visual  deficits Patient Visual Report: No change from baseline Vision - Assessment Eye Alignment: Within Functional Limits Vision Assessment: Vision tested Saccades: Within functional limits Perception Perception: Within Functional Limits Praxis Praxis: Intact  Cognition Overall Cognitive Status: Impaired Arousal/Alertness: Awake/alert Orientation Level: Oriented X4 Attention: Divided Divided Attention: Impaired Divided Attention Impairment: Verbal basic;Functional basic Memory: Impaired Memory Impairment: Decreased recall of new information;Decreased short term memory Decreased Short Term Memory: Verbal basic;Functional basic Awareness: Impaired Awareness Impairment: Anticipatory impairment Problem Solving: Impaired Problem Solving Impairment: Verbal basic;Functional basic Executive Function: Sequencing;Organizing Sequencing: Impaired Sequencing Impairment: Verbal basic;Functional basic Organizing: Impaired Organizing Impairment: Verbal basic;Functional basic Sensation Sensation Light Touch: Appears Intact Stereognosis: Appears Intact Hot/Cold: Appears Intact Proprioception: Appears Intact Coordination Gross Motor Movements are Fluid and Coordinated: Yes Fine Motor Movements are Fluid and Coordinated: Yes Motor  Motor Motor - Discharge Observations: continues to be limited by back pain and weakness Mobility  Transfers Sit to Stand: 5: Supervision Stand to Sit: 5: Supervision  Trunk/Postural Assessment  Cervical Assessment Cervical Assessment: Within Functional Limits Thoracic Assessment Thoracic Assessment: Within Functional Limits Lumbar Assessment Lumbar Assessment:  (increased lordosis)  Balance Static Standing Balance Static Standing - Balance Support: During functional activity Static Standing - Level of Assistance: 5: Stand by assistance Dynamic Standing Balance Dynamic Standing - Balance Support: During functional activity Dynamic Standing - Level of  Assistance: 5: Stand by assistance Extremity/Trunk Assessment RUE Assessment RUE Assessment: Within Functional Limits LUE Assessment LUE Assessment: Within Functional Limits  See FIM for current functional status  Roney Mans [facility] 04/20/2012, 11:42 AM

## 2012-04-20 NOTE — Progress Notes (Signed)
Speech Language Pathology Discharge Summary  Patient Details  Name: [name] MRN: [id number] Date of Birth: [birth year]  Today's Date: 04/20/2012  Patient has met 4 of 4 long term goals.  Patient to discharge at [facility] level.   Reasons goals not met: N/A   Clinical Impression/Discharge Summary: Pt has made functional gains and has met 4 out of 4 LTG's this reporting period. Currently, pt is demonstrating behaviors consistent with a rancho level VII and requires Min A for functional problem solving, organization, divided attention, and working memory with utilization of compensatory strategies. Pt's activity tolerance and ability to carry out basic tasks is inconsistent and can be impacted by her pain in hips and back, requiring rest breaks. Pt/family education complete. Recommend pt would benefit from f/u home health SLP services to maximize cognitive function and overall independence.    Care Partner:  Caregiver Able to Provide Assistance: Yes  Type of Caregiver Assistance: Cognitive  Recommendation:  Home Health SLP  Rationale for SLP Follow Up: Maximize cognitive function and independence   Equipment: N/A   Reasons for discharge: Treatment goals met;Discharged from hospital   Patient/Family Agrees with Progress Made and Goals Achieved: Yes   See FIM for current functional status  Alyssandra Hulsebus 04/20/2012, 4:00 PM

## 2012-04-20 NOTE — Telephone Encounter (Signed)
Husband returned call.  I instructed him to contact [employer] to arrange delivery of Relvimid.  He asks about co-pay and I instructed him to ask [employer] when he calls them and to call me back if price is prohibitive.  Instructed for pt to start taking when she gets it and to notify us.  Also informed that scheduling will call for follow up visit to see Dr. Gaylyn Rong sometime next week.  He verbalized understanding and will call back if any problems w/ the Revlimid.

## 2012-04-20 NOTE — Telephone Encounter (Signed)
Spouse Xiadani Damman called returning call from collaborative nurse.  Asked that we call him on his cell number not the home number.  Cell number is [phone].  Will notify registration to correct this information.  Reports he is retired and there is no work Curator.  All three numbers entered to date are the home number.  Call transferred to collaborative nurse at patient's request.

## 2012-04-20 NOTE — Progress Notes (Signed)
Physical Therapy Note  Patient Details  Name: [name] MRN: [id number] Date of Birth: [birth year] Today's Date: 04/20/2012  Time: 1100-1142 42 minutes  Pt c/o back pain, medicated prior to session, rests prn.  Treatment focused on family ed with pt's husband.  Pt's husband educated on and safely demo'd gait in controlled and household environments, stand pivot transfers with RW, sliding board transfers, car transfers, bed mobility, toilet transfers.  Pt/husband express understanding of recommendation for HHPT, use of RW at all times at home, need for energy conservation and rest breaks.  Pt currently at a supervision/mod I level for all transfers, gait and mobility.  Husband educated to provide min A for days when pt is more fatigued.  Individual therapy   Jaicey Sweaney 04/20/2012, 12:20 PM

## 2012-04-20 NOTE — Progress Notes (Signed)
Speech Language Pathology Daily Session Note  Patient Details  Name: [name] MRN: [id number] Date of Birth: [birth year]  Today's Date: 04/20/2012 Time: 1015-1055 Time Calculation (min): 40 min  Short Term Goals: Week 1: SLP Short Term Goal 1 (Week 1): Pt will utilize external aids to recall new, daiily information with supervision question and semantic cues.  SLP Short Term Goal 2 (Week 1): Pt will demonstrate complex problem solving with supervision question and semantic cues.  SLP Short Term Goal 3 (Week 1): Pt will demonstrate alternating attention between tasks with supervision verbal ceus for redirection SLP Short Term Goal 4 (Week 1): Pt will demonstrate appropriate safety awareness and utilize call bell to express wants/needs with Mod I.   Skilled Therapeutic Interventions: Treatment focus on family education with pt's husband and son in regards to current cognitive function and strategies to utilize at home to increase working memory, problem solving, organization and overall independence. Handouts given and pt's husband and son verbalized and demonstrated understanding.    FIM:  Comprehension Comprehension Mode: Auditory Comprehension: 5-Understands complex 90% of the time/Cues < 10% of the time Expression Expression Mode: Verbal Expression: 5-Expresses complex 90% of the time/cues < 10% of the time Social Interaction Social Interaction: 6-Interacts appropriately with others with medication or extra time (anti-anxiety, antidepressant). Problem Solving Problem Solving: 4-Solves basic 75 - 89% of the time/requires cueing 10 - 24% of the time Memory Memory: 4-Recognizes or recalls 75 - 89% of the time/requires cueing 10 - 24% of the time  Pain Pain Assessment Pain Assessment: 0-10 Pain Score:   4 Pain Type: Acute pain Pain Location: Back Pain Orientation: Lower Pain Descriptors: Aching;Sore;Tender Pain Onset: On-going Patients Stated Pain Goal: 2 Pain  Intervention(s): Repositioned  Therapy/Group: Individual Therapy  Aanya Haynes 04/20/2012, 3:48 PM

## 2012-04-20 NOTE — Progress Notes (Signed)
Physical Therapy Note  Patient Details  Name: [name] MRN: [id number] Date of Birth: March 25, 1936 Today's Date: 04/20/2012  Time: 1415-1431 16 minutes  Pt c/o increased pain and fatigue, medicated prior to session, IV recently started.  Pt unable to perform any out of bed activity.  Pt assisted with repositioning with cues for technique, sequencing.  Reviewed pt's equipment for home (RW, w/c, sliding board).  Went over set up and use of all equipment, pt expresses understanding.  Individual therapy    Roslynn Holte 04/20/2012, 3:00 PM

## 2012-04-20 NOTE — Progress Notes (Signed)
Social Work Patient ID: [name], female   DOB: Aug 07, 1936, 76 y.o.   MRN: [id number]  Met yesterday and this morning with pt, husband and son to review team conference.  All aware and agreeable with targeted d/c 7/25 at supervision goals. Family ed planned for this a.m. And therapies report all went well.  Planning for d/c tomorrow.  [name]

## 2012-04-20 NOTE — Progress Notes (Signed)
Occupational Therapy Session Note  Patient Details  Name: AMENAH TUCCI MRN: [id number] Date of Birth: 08-29-1936  Today's Date: 04/20/2012 Time: 0915-1010 Time Calculation (min): 55 min  Short Term Goals: Week 1:  OT Short Term Goal 1 (Week 1): Pt will transfer to toilet wtih supervision with LRAD OT Short Term Goal 2 (Week 1): Pt will demonstrate selective attention with min A in a moderate distracting environment OT Short Term Goal 3 (Week 1): Pt will call for toileting needs 75% of time during day OT Short Term Goal 4 (Week 1): Pt wound don LB clothing with min A including shoes/ special foot boot  Skilled Therapeutic Interventions/Progress Updates:    1:1 Family education with son, [name] and husband Annette Stable: focus on safe use of DME, home setup, encouragement of mobility at home to fight fatigue, discussion about pt's "bad days versus good days" shower stall transfer, tolieting, incontinent episodes of bladder and how to safely handle bodily fluids (with gloves and caution), sit to stands, functional ambulation/ mobility with RW, activity tolerance, sensitivity of skin on back, f/u therapies, standing tolerance, dressing and adaptive ways for LB dressing  (however pt declines to use these strategies - ie use of a reacher)  Therapy Documentation Precautions:  Precautions Precautions: Fall Precaution Comments: no closed toe shoe on left foot- wound on greater left toe, has a special boot Restrictions Weight Bearing Restrictions: No Pain: Pain Assessment Pain Assessment: 0-10 Pain Score:   8 Pain Type: Acute pain Pain Location: Back Pain Descriptors: Aching Pain Frequency: Intermittent Pain Intervention(s): Medication (See eMAR)   See FIM for current functional status  Therapy/Group: Individual Therapy  Roney Mans [facility] 04/20/2012, 11:24 AM

## 2012-04-20 NOTE — Progress Notes (Signed)
Subjective/Complaints: She feels that pain patches were helpful A 12 point review of systems has been performed and if not noted above is otherwise negative.  CBG (last 3)  No results found for this basename: GLUCAP:3 in the last 72 hours   BP Readings from Last 3 Encounters:  04/20/12 139/81  04/12/12 142/82  04/12/12 142/82     Objective: Vital Signs: Blood pressure 139/81, pulse 84, temperature 97.5 F (36.4 C), temperature source Oral, resp. rate 19, height 5\' 7"  (1.702 m), weight 83.008 kg (183 lb), SpO2 93.00%. No results found.  Basename 04/20/12 0620  WBC 13.6*  HGB 8.4*  HCT 25.3*  PLT 203   No results found for this basename: NA:2,K:2,CL:2,CO2:2,GLUCOSE:2,BUN:2,CREATININE:2,CALCIUM:2 in the last 72 hours CBG (last 3)  No results found for this basename: GLUCAP:3 in the last 72 hours  Wt Readings from Last 3 Encounters:  04/13/12 83.008 kg (183 lb)  04/04/12 79.6 kg (175 lb 7.8 oz)  04/04/12 79.6 kg (175 lb 7.8 oz)    Physical Exam:   General: Alert and oriented x 3, No apparent distress. Urine has strong odor HEENT: Head is normocephalic, atraumatic, PERRLA, EOMI, sclera anicteric, oral mucosa pink and moist, dentition intact, ext ear canals clear,  Neck: Supple without JVD or lymphadenopathy Heart: Reg rate and rhythm. No murmurs rubs or gallops Chest: CTA bilaterally without wheezes, rales, or rhonchi; no distress Abdomen: Soft, non-tender, non-distended, bowel sounds positive. Extremities: No clubbing, cyanosis, or edema. Pulses are 2+ Skin: Clean and intact without signs of breakdown. Right great toe healing. Sutures out Neuro: Pt is cognitively appropriate with generally reasonable insight, memory, and awareness. Cranial nerves 2-12 are intact. Sensory exam is normal. Reflexes are 2+ in all 4's. Fine motor coordination is fair. No tremors. Motor function is grossly 4/5. Perhaps a little impulsive Musculoskeletal: Full ROM, mild pain in the low back with  flexion and palpation. Posture appropriate in bed Psych: Pt's affect is appropriate. Pt is cooperative    Assessment/Plan: 1. Functional deficits secondary to multiple myeloma, SDH which require 3+ hours per day of interdisciplinary therapy in a comprehensive inpatient rehab setting. Physiatrist is providing close team supervision and 24 hour management of active medical problems listed below. Physiatrist and rehab team continue to assess barriers to discharge/monitor patient progress toward functional and medical goals.  Working toward [employer]. Family ed and dc planning  FIM: FIM - Bathing Bathing Steps Patient Completed:  (refused bath) Bathing:  (refused bath)  FIM - Upper Body Dressing/Undressing Upper body dressing/undressing steps patient completed: Thread/unthread right sleeve of pullover shirt/dresss;Thread/unthread left sleeve of pullover shirt/dress;Put head through opening of pull over shirt/dress;Pull shirt over trunk Upper body dressing/undressing: 0: Wears gown/pajamas-no public clothing FIM - Lower Body Dressing/Undressing Lower body dressing/undressing steps patient completed: Thread/unthread right underwear leg;Thread/unthread left underwear leg;Pull underwear up/down;Thread/unthread right pants leg;Thread/unthread left pants leg;Pull pants up/down;Don/Doff right sock;Don/Doff left sock (extra time and rest breaks required due to c/o fatigue) Lower body dressing/undressing: 0: Wears gown/pajamas-no public clothing  FIM - Toileting Toileting steps completed by patient: Adjust clothing prior to toileting;Performs perineal hygiene;Adjust clothing after toileting Toileting Assistive Devices: Grab bar or rail for support Toileting: 4: Steadying assist  FIM - Diplomatic Services operational officer Devices: Best boy Transfers: 4-To toilet/BSC: Min A (steadying Pt. > 75%);4-From toilet/BSC: Min A (steadying Pt. > 75%)  FIM - Physiological scientist Devices: Walker;Bed rails;Arm rests Bed/Chair Transfer: 4: Supine > Sit: Min A (steadying Pt. > 75%/lift  1 leg);5: Sit > Supine: Supervision (verbal cues/safety issues);5: Bed > Chair or W/C: Supervision (verbal cues/safety issues);5: Chair or W/C > Bed: Supervision (verbal cues/safety issues)  FIM - Locomotion: Wheelchair Locomotion: Wheelchair: 1: Total Assistance/staff pushes wheelchair (Pt<25%) FIM - Locomotion: Ambulation Locomotion: Ambulation Assistive Devices: Designer, industrial/product Ambulation/Gait Assistance: 5: Supervision Locomotion: Ambulation: 2: Travels 50 - 149 ft with supervision/safety issues  Comprehension Comprehension Mode: Auditory Comprehension: 5-Understands complex 90% of the time/Cues < 10% of the time  Expression Expression Mode: Verbal Expression: 5-Expresses complex 90% of the time/cues < 10% of the time  Social Interaction Social Interaction: 6-Interacts appropriately with others with medication or extra time (anti-anxiety, antidepressant).  Problem Solving Problem Solving: 4-Solves basic 75 - 89% of the time/requires cueing 10 - 24% of the time  Memory Memory: 4-Recognizes or recalls 75 - 89% of the time/requires cueing 10 - 24% of the time Medical Problem List and Plan:  1. DVT Prophylaxis/Anticoagulation: Mechanical: Sequential compression devices, below knee Bilateral lower extremities  2. Pain Management: Pressure relief measures in conjunction with prn oxycodone effective. Her low back remains a source of pain at night and with activity.   -consider scheduling meds if pain becomes more of a problem-doing fairly well at present  -added fentanyl patch as well as lidoderm patches-watch for any neurosedating SE 3. Mood: Looks well. Dealing with her diagnosis realistically. Will have LCSW to follow up for formal evaluation.  4. Neuropsych: This patient is capable of making decisions on his/her own behalf.  5.  Multiple Myeloma: Velcade on 3 wks then 1 week off with dexamethasone 40 mg/wk. Acyclovir to decrease risk for viral infection. Patient to follow up with Dr. Gaylyn Rong for initiation of Revlimid at later date. 6. Acute renal failure: resolved with hydration. Recheck bmet ok 7. Chronic constipation: continue current regimen. No problems at present 8. Check ua and culture-requested not done  LOS (Days) 8 A FACE TO FACE EVALUATION WAS PERFORMED  Simon Llamas T 04/20/2012, 7:20 AM

## 2012-04-20 NOTE — Telephone Encounter (Signed)
Call from  Delle Reining, PA at [facility] unit.  States planned d/c home tomorrow.  Dr. Gaylyn Rong notified and he will send orders for Velcade and follow up appts.  Pt to arrange delivery of Revlimid and start as soon as she gets it, call us to notify when she starts medication.  Rinaldo Cloud will inform pt and husband to expect call from our scheduling department and to start Revlimid.   I called pt's husband and left VM for him to return nurse's call so I can instruct on Revlimid and Schedule.

## 2012-04-21 ENCOUNTER — Telehealth: Payer: Self-pay | Admitting: *Deleted

## 2012-04-21 ENCOUNTER — Encounter: Payer: Self-pay | Admitting: *Deleted

## 2012-04-21 LAB — CBC WITH DIFFERENTIAL/PLATELET
Basophils Absolute: 0 10*3/uL (ref 0.0–0.1)
Basophils Relative: 0 % (ref 0–1)
Eosinophils Relative: 1 % (ref 0–5)
HCT: 24 % — ABNORMAL LOW (ref 36.0–46.0)
Hemoglobin: 8.1 g/dL — ABNORMAL LOW (ref 12.0–15.0)
Lymphocytes Relative: 26 % (ref 12–46)
Monocytes Relative: 10 % (ref 3–12)
Neutro Abs: 6 10*3/uL (ref 1.7–7.7)
RBC: 2.62 MIL/uL — ABNORMAL LOW (ref 3.87–5.11)
RDW: 14.7 % (ref 11.5–15.5)
WBC: 9.4 10*3/uL (ref 4.0–10.5)

## 2012-04-21 LAB — BASIC METABOLIC PANEL
BUN: 31 mg/dL — ABNORMAL HIGH (ref 6–23)
CO2: 17 mEq/L — ABNORMAL LOW (ref 19–32)
Chloride: 100 mEq/L (ref 96–112)
Chloride: 100 mEq/L (ref 96–112)
Creatinine, Ser: 1.31 mg/dL — ABNORMAL HIGH (ref 0.50–1.10)
GFR calc Af Amer: 46 mL/min — ABNORMAL LOW (ref >90)
GFR calc Af Amer: 45 mL/min — ABNORMAL LOW (ref >90)
Potassium: 4.5 mEq/L (ref 3.5–5.1)
Sodium: 136 mEq/L (ref 135–145)

## 2012-04-21 MED ORDER — CIPROFLOXACIN HCL 250 MG PO TABS: 250.0000 mg | ORAL_TABLET | Freq: Two times a day (BID) | ORAL | Status: AC

## 2012-04-21 MED ORDER — FENTANYL 25 MCG/HR TD PT72
25.0000 ug | MEDICATED_PATCH | TRANSDERMAL | Status: DC
  Administered 2012-04-21: 25 ug via TRANSDERMAL
  Filled 2012-04-21: qty 1

## 2012-04-21 MED ORDER — FENTANYL 12 MCG/HR TD PT72: 2.0000 | MEDICATED_PATCH | TRANSDERMAL | Status: DC

## 2012-04-21 MED ORDER — FENTANYL 25 MCG/HR TD PT72: 1.0000 | MEDICATED_PATCH | TRANSDERMAL | Status: AC

## 2012-04-21 MED ORDER — CIPROFLOXACIN HCL 250 MG PO TABS
250.0000 mg | ORAL_TABLET | Freq: Two times a day (BID) | ORAL | Status: DC
  Administered 2012-04-21: 250 mg via ORAL
  Filled 2012-04-21 (×3): qty 1

## 2012-04-21 NOTE — Telephone Encounter (Signed)
patient confirmed over the phone the new date and time on 04-26-2012 starting at 8:15am

## 2012-04-21 NOTE — Progress Notes (Signed)
Patient discharge to home with husband and family at 1110.  Discharge instruction provided by Delle Reining, PA. Patient verbalize understanding, with no further questions.  Patient belonging packed by family, escorted off unit by Rehab NT.

## 2012-04-21 NOTE — Plan of Care (Signed)
Problem: RH PAIN MANAGEMENT Goal: RH STG PAIN MANAGED AT OR BELOW PT'S PAIN GOAL 2/10  Outcome: Adequate for Discharge Patient complain of pain 8/10 on scale 0-10.  With medication intervention patient able to get the pain down to a 3.

## 2012-04-21 NOTE — Discharge Summary (Signed)
NAMECHENNEL, OLIVOS NO.:  [id number]  MEDICAL RECORD NO.:  [id number]  LOCATION:  4009                         FACILITY:  [facility]  PHYSICIAN:  Ranelle Oyster, M.D.DATE OF BIRTH:  May 26, 1936  DATE OF ADMISSION:  04/12/2012 DATE OF DISCHARGE:  04/21/2012                              DISCHARGE SUMMARY   DISCHARGE DIAGNOSES: 1. Multiple myeloma. 2. Subdural hemorrhage. 3. Recurrent acute renal failure. 4. Hypokalemia, resolved. 5. Urinary tract infection. 6. Leukocytosis, resolved. 7. Chronic pain.  HISTORY OF PRESENT ILLNESS:  Ms. [name] is a 76 year old female with history of hypertension, DDD with 26-month history of back pain, lower extremity weakness, and urinary incontinence.  She was admitted on April 04, 2012, for workup revealing acute renal failure with hypercalcemia, proteinuria.  MRI of L-spine showed numerous lesions and Hem/Onc was consulted for work up on multiple myeloma. She had unwitnessed fall with subsequent subdural hematoma.  She was started on steroids by hospitalist for cerebral edema.  No surgical intervention needed per Input by Dr. Aliene Beams. 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 Gaylyn Rong was consulted for input. The patient was treated with a dose of Aredia on April 11, 2012, and was started on Velcade on April 12, 2012.  Acyclovir was also initiated to decrease risk of viral infection.  Dr. Gaylyn Rong recommends cyclic Velcade for 3 weeks on and 1 week off and initiation of Revlimid at a later date.  The patient's acute renal failure has improved with hydration.  The patient was evaluated by Rehab team and we felt that she would benefit from a CIR program.  PAST MEDICAL HISTORY: 1. Anxiety. 2. Hypertension. 3. Hyperlipidemia. 4. Depression. 5. Osteopenia. 6. Back pain.  FUNCTIONAL HISTORY:  The patient was independent prior to admission. She has been limited in her mobility and endurance issues for the past 5- 6  months due to pain and poor energy levels.  FUNCTIONAL STATUS:  The patient was at min to mod assist for bed mobility, min guard assist for transfers, min assist for ambulating 50 feet with shuffling gait, and flexed portion noted.  She required setup for upper body dressing, mod to max assist for lower body care due to complaints of back pain.  RECENT LABS:  Lytes from April 21, 2012, reveals sodium 137, potassium 4.9, chloride 100, CO2 of 17, BUN 31, creatinine 1.31, glucose 124.  CBC of April 21, 2012, reveals hemoglobin 8.1, hematocrit 24.0, white count 9.4, platelets 169.  Urine culture of April 20, 2012, shows greater than 100,000 colonies of E. coli.  HOSPITAL COURSE:  [name] was admitted to rehab on April 12, 2012, for inpatient therapies to consist of PT, OT, and speech therapy at least 3 hours 5 days a week.  Past admission, physiatrist, rehab, RN, and therapy team have worked together to provide customized collaborative interdisciplinary care.  Rehab RN has worked with the patient on bowel and bladder program as well as med administration.  The patient's blood pressures were checked on b.i.d. basis during this stay. These have been reasonably controlled ranging from 120s-130s systolics and 70s to 80s diastolic.  The patient's p.o. intake has been good  until the last 24-48 hours.  With increase in activity levels, the patient was noted to have increase in back and hip pain.  She was started on fentanyl patch, which was titrated 25 mcg an hour in addition to p.r.n. oxycodone to help with pain management.   She received Velcade on April 13, 2015, as well as April 19, 2012. Recheck lytes nt on April 20, 2012, revealed the patient to have renal insufficiency with BUN at 33, creatinine at 1.22, and hyperkalemia with potassium at 5.4.  She was treated with at 1 L of IV fluids.  Recheck lytes of April 21, 2012, revealed sodium 137, potassium 4.9, chloride 100, CO2 of 17, BUN  31, creatinine 1.31, glucose 124.  The patient has had poor p.o. intake in the last 24-48 hours in part due to pain.  The patient and family have been advised about pushing p.o. fluids past discharge to help with her renal insufficiency.  Anticipate with increase in pain medicines to help with pain management, her activity tolerance as well as p.o. intake will further improve.  Family  were advised to continue offering the patient nutritional supplements throughout the day.  Due to complaints of lower back pain, UA was ordered.  UA was positive.  Urine culture showed greater than 100,000 colonies of E. coli and the patient was started on Cipro on April 21, 2012, prior to discharge.  We will contact the patient regarding final results if different antibiotics are needed.  During the patient's stay in rehab, weekly team conferences were held to monitor the patient's progress, set goals, as well as discuss barriers to discharge.  Physical Therapy has worked with the patient on activity tolerance, balance, postural control as well as strengthening.  The patient is currently at supervision level for transfers, supervision level for ambulating household distances.  The patient's husband has been educated on gait in control in household environment, stand pivot transfers with rolling walker as well as sliding board transfers.  The patient has been advised about using rolling walker at all times for energy conservation and as well as taking rest breaks.  OT has worked with the patient on self-care tasks.  The patient is showing improvement in right lower as well as left upper and left lower extremity.  She is showing improved attention, improved awareness, and improved coordination.  She is overall at min assist for basic ADLs.  Her activity tolerance has been greatly impacted by pain in her hips and back requiring multiple rest breaks, as well as assistance with lower body bathing and dressing.   Family education has been done with husband and son who will provide 24 hours supervision and assistance as needed past discharge.  Speech Therapy has been working with the patient on high-level cognitive tasks.  The patient is currently requiring assistance in regards to her medication and money management as well as working Civil [employer] fast streamer.  She requires supervision with verbal cues for maintaining alternating attention and for recalling new and daily information.  Further followup home health speech therapy to continue past discharge.  DISCHARGE MEDICATIONS: 1. Acyclovir 200 mg 2 tabs p.o. per day. 2. Cipro 250 mg b.i.d. 3. Decadron 4 mg 10 tabs once a week when getting Velcade. 4. Fentanyl patch 12 mcg, use 2 patches q.3 days until gone.  Two     boxes prescription. 5. Fentanyl 25 mcg an hour 1 patch q.3 days.  Transition to     25 mcg patches once 12 mcg patch is  used up.  1 box prescription. 6. Lidocaine patches to back on 7 a.m. and off 7 p.m. daily. 7. OxyIR 5 mg 1-2 p.o. q.4 hours p.r.n. moderate-to-severe pain #90     prescription. 8. MiraLAX 17 g in 8 ounces p.o. b.i.d. 9. Crestor 10 mg p.o. per day. 10.Velcade once a week at Dr. Lodema Pilot office. 11.Xanax 1 mg p.o. at bedtime p.r.n. anxiety/insomnia.  DIET:  Regular diet.  ACTIVITY LEVEL:  As tolerated with 24-hour supervision. Use walker for Ambulation. Supervision for mobility.  SPECIAL INSTRUCTIONS:  [facility] to provide PT, OT, and speech therapy.  Start using Revlimid.  Drink plenty of water. Do not use Mobic,  ibuprofen, Advil, or Aleve.  FOLLOWUP:  The patient to follow up with Dr. Gaylyn Rong next week.  Follow up with Dr. Riley Kill on May 24, 2012, at 11 a.m.  Follow up with Dr. Aliene Beams for routine check.  Follow up with Dr. [name] on May 04, 2012, at 10 a.m. for posthospital check and follow up on renal  function.     Delle Reining, P.A.   ______________________________ Ranelle Oyster,  M.D.    PL/MEDQ  D:  04/21/2012  T:  04/21/2012  Job:  [id number]  cc:   Exie Parody, M.D. Reinaldo Meeker, M.D. [name], M.D.

## 2012-04-21 NOTE — Progress Notes (Signed)
Social Work  Discharge Note  The overall goal for the admission was met for:   Discharge location: Yes - home with husband and son  Length of Stay: Yes - 10 days  Discharge activity level: Yes - supervision to minimal assistance  Home/community participation: Yes  Services provided included: MD, RD, PT, OT, SLP, RN, CM, TR, Pharmacy and SW  Financial Services: Medicare and Private Insurance: [employer]  Follow-up services arranged: Home Health: PT, OT, ST via [facility], DME: 18x18 [employer] w/c, Basic cushion, roll.walker, 3n1 commode, and 24" tf board via [facility] and Patient/Family has no preference for HH/DME agencies  Comments (or additional information):  Patient/Family verbalized understanding of follow-up arrangements: Yes  Individual responsible for coordination of the follow-up plan: pt and spouse  Confirmed correct DME delivered: Tammra Pressman 04/21/2012    Selia Wareing

## 2012-04-21 NOTE — Progress Notes (Signed)
Patient ID: [name], female   DOB: 10-19-1935, 76 y.o.   MRN: [id number] Subjective/Complaints: Having a lot of right rib pain. Thinks she might have slept on it wrong. Has a hard time taking a deep breath. A 12 point review of systems has been performed and if not noted above is otherwise negative.  CBG (last 3)  No results found for this basename: GLUCAP:3 in the last 72 hours   BP Readings from Last 3 Encounters:  04/21/12 124/73  04/12/12 142/82  04/12/12 142/82     Objective: Vital Signs: Blood pressure 124/73, pulse 89, temperature 98.6 F (37 C), temperature source Oral, resp. rate 19, height 5\' 7"  (1.702 m), weight 77.021 kg (169 lb 12.8 oz), SpO2 95.00%. No results found.  Basename 04/20/12 0620  WBC 13.6*  HGB 8.4*  HCT 25.3*  PLT 203    Basename 04/20/12 0620  NA 135  K 5.4*  CL 98  CO2 14*  GLUCOSE 163*  BUN 33*  CREATININE 1.22*  CALCIUM 10.2   CBG (last 3)  No results found for this basename: GLUCAP:3 in the last 72 hours  Wt Readings from Last 3 Encounters:  04/20/12 77.021 kg (169 lb 12.8 oz)  04/04/12 79.6 kg (175 lb 7.8 oz)  04/04/12 79.6 kg (175 lb 7.8 oz)    Physical Exam:   General: Alert and oriented x 3, mild distress. HEENT: Head is normocephalic, atraumatic, PERRLA, EOMI, sclera anicteric, oral mucosa pink and moist, dentition intact, ext ear canals clear,  Neck: Supple without JVD or lymphadenopathy Heart: Reg rate and rhythm. No murmurs rubs or gallops Chest: CTA bilaterally without wheezes, rales, or rhonchi; no distress Abdomen: Soft, non-tender, non-distended, bowel sounds positive. Extremities: No clubbing, cyanosis, or edema. Pulses are 2+ Skin: Clean and intact without signs of breakdown. Right great toe healing. Sutures out Neuro: Pt is cognitively appropriate with generally reasonable insight, memory, and awareness. Cranial nerves 2-12 are intact. Sensory exam is normal. Reflexes are 2+ in all 4's. Fine motor  coordination is fair. No tremors. Motor function is grossly 4/5. Perhaps a little impulsive Musculoskeletal: pain along left lower rib margin and low back.  Posture appropriate in bed Psych: Pt's affect is appropriate. Pt is cooperative    Assessment/Plan: 1. Functional deficits secondary to multiple myeloma, SDH which require 3+ hours per day of interdisciplinary therapy in a comprehensive inpatient rehab setting. Physiatrist is providing close team supervision and 24 hour management of active medical problems listed below. Physiatrist and rehab team continue to assess barriers to discharge/monitor patient progress toward functional and medical goals.  Pending bmet today, i think she can go home. She will need onc follow up for ctx, pain, etc.  FIM: FIM - Bathing Bathing Steps Patient Completed: Chest;Right Arm;Left Arm;Abdomen;Front perineal area;Buttocks;Right lower leg (including foot);Left upper leg;Right upper leg;Left lower leg (including foot) Bathing: 5: Set-up assist to: Adjust water temp  FIM - Upper Body Dressing/Undressing Upper body dressing/undressing steps patient completed: Thread/unthread right sleeve of pullover shirt/dresss;Thread/unthread left sleeve of pullover shirt/dress;Put head through opening of pull over shirt/dress;Pull shirt over trunk Upper body dressing/undressing: 6: More than reasonable amount of time FIM - Lower Body Dressing/Undressing Lower body dressing/undressing steps patient completed: Thread/unthread right pants leg;Thread/unthread left pants leg;Pull pants up/down;Don/Doff left sock;Don/Doff right sock;Pull underwear up/down Lower body dressing/undressing: 4: Min-Patient completed 75 plus % of tasks  FIM - Toileting Toileting steps completed by patient: Adjust clothing prior to toileting;Adjust clothing after toileting;Performs perineal hygiene Toileting Assistive  Devices: Grab bar or rail for support Toileting: 5: Supervision: Safety  issues/verbal cues  FIM - Diplomatic Services operational officer Devices: Best boy Transfers: 6-More than reasonable amt of time  FIM - Banker Devices: Environmental consultant;Bed rails;Arm rests Bed/Chair Transfer: 6: Chair or W/C > Bed: No assist;6: Supine > Sit: No assist;6: Sit > Supine: No assist;6: Bed > Chair or W/C: No assist  FIM - Locomotion: Wheelchair Locomotion: Wheelchair: 1: Total Assistance/staff pushes wheelchair (Pt<25%) FIM - Locomotion: Ambulation Locomotion: Ambulation Assistive Devices: Designer, industrial/product Ambulation/Gait Assistance: 5: Supervision Locomotion: Ambulation: 2: Travels 50 - 149 ft with supervision/safety issues  Comprehension Comprehension Mode: Auditory Comprehension: 5-Understands complex 90% of the time/Cues < 10% of the time  Expression Expression Mode: Verbal Expression: 5-Expresses complex 90% of the time/cues < 10% of the time  Social Interaction Social Interaction: 6-Interacts appropriately with others with medication or extra time (anti-anxiety, antidepressant).  Problem Solving Problem Solving: 4-Solves basic 75 - 89% of the time/requires cueing 10 - 24% of the time  Memory Memory: 4-Recognizes or recalls 75 - 89% of the time/requires cueing 10 - 24% of the time Medical Problem List and Plan:  1. DVT Prophylaxis/Anticoagulation: Mechanical: Sequential compression devices, below knee Bilateral lower extremities  2. Pain Management: Pressure relief measures in conjunction with prn oxycodone effective. Her low back remains a source of pain at night and with activity.   -consider scheduling meds if pain becomes more of a problem-doing fairly well at present  -increase fentanyl to . Use lidoderm patches over ribs too. 3. Mood: Looks well. Dealing with her diagnosis realistically. Will have LCSW to follow up for formal evaluation.  4. Neuropsych: This patient is capable of making decisions on  his/her own behalf.  5. Multiple Myeloma: Velcade on 3 wks then 1 week off with dexamethasone 40 mg/wk. Acyclovir to decrease risk for viral infection. Patient to follow up with Dr. Gaylyn Rong for initiation of Revlimid at later date. 6. Acute renal failure: f/u bmet today after bump in CR yesterday (CTX related lkely) 7. Chronic constipation: continue current regimen. No problems at present 8. +UA- begin empiric cipro. Culture pending.  LOS (Days) 9 A FACE TO FACE EVALUATION WAS PERFORMED  SWARTZ,ZACHARY T 04/21/2012, 7:45 AM

## 2012-04-21 NOTE — Progress Notes (Signed)
Spoke w/ Devonne Doughty at [facility] yesterday afternoon ph#[phone],  Ext [phone].  Confirmed pt is to start Revlimid and ok to ship medication.  Received Fax from [facility] this morning they shipped pt's med on 04/20/12 for delivery today.

## 2012-04-22 ENCOUNTER — Telehealth: Payer: Self-pay | Admitting: Family Medicine

## 2012-04-22 LAB — URINE CULTURE

## 2012-04-22 NOTE — Telephone Encounter (Signed)
Called and spoke with husband [name]. She is still having memory problems but her pain is well contorolled. Has f/u with Dr. Gaylyn Rong next week.

## 2012-04-26 ENCOUNTER — Encounter: Payer: Self-pay | Admitting: Radiation Oncology

## 2012-04-26 ENCOUNTER — Ambulatory Visit (HOSPITAL_BASED_OUTPATIENT_CLINIC_OR_DEPARTMENT_OTHER): Payer: Medicare Other | Admitting: Oncology

## 2012-04-26 ENCOUNTER — Ambulatory Visit (HOSPITAL_BASED_OUTPATIENT_CLINIC_OR_DEPARTMENT_OTHER): Payer: Medicare Other

## 2012-04-26 ENCOUNTER — Other Ambulatory Visit (HOSPITAL_BASED_OUTPATIENT_CLINIC_OR_DEPARTMENT_OTHER): Payer: Medicare Other | Admitting: Lab

## 2012-04-26 ENCOUNTER — Telehealth: Payer: Self-pay | Admitting: Physical Medicine & Rehabilitation

## 2012-04-26 ENCOUNTER — Telehealth: Payer: Self-pay | Admitting: Oncology

## 2012-04-26 VITALS — BP 144/95 | HR 98 | Temp 97.0°F | Ht 67.0 in | Wt 163.1 lb

## 2012-04-26 DIAGNOSIS — C9000 Multiple myeloma not having achieved remission: Secondary | ICD-10-CM

## 2012-04-26 DIAGNOSIS — M25559 Pain in unspecified hip: Secondary | ICD-10-CM

## 2012-04-26 DIAGNOSIS — D6481 Anemia due to antineoplastic chemotherapy: Secondary | ICD-10-CM

## 2012-04-26 DIAGNOSIS — Z5112 Encounter for antineoplastic immunotherapy: Secondary | ICD-10-CM

## 2012-04-26 LAB — CBC WITH DIFFERENTIAL/PLATELET
BASO%: 0.1 % (ref 0.0–2.0)
EOS%: 4.8 % (ref 0.0–7.0)
HGB: 9.1 g/dL — ABNORMAL LOW (ref 11.6–15.9)
MCH: 31.3 pg (ref 25.1–34.0)
MCV: 90.7 fL (ref 79.5–101.0)
MONO%: 7.1 % (ref 0.0–14.0)
RBC: 2.92 10*6/uL — ABNORMAL LOW (ref 3.70–5.45)
RDW: 13.9 % (ref 11.2–14.5)
lymph#: 1.2 10*3/uL (ref 0.9–3.3)

## 2012-04-26 MED ORDER — ONDANSETRON HCL 8 MG PO TABS
8.0000 mg | ORAL_TABLET | Freq: Once | ORAL | Status: AC
  Administered 2012-04-26: 8 mg via ORAL

## 2012-04-26 MED ORDER — ENOXAPARIN SODIUM 40 MG/0.4ML ~~LOC~~ SOLN: 40.0000 mg | Freq: Every day | SUBCUTANEOUS | Status: DC

## 2012-04-26 MED ORDER — HYDROCODONE-ACETAMINOPHEN 5-500 MG PO TABS: 1.0000 | ORAL_TABLET | Freq: Four times a day (QID) | ORAL | Status: AC | PRN

## 2012-04-26 MED ORDER — HYDROCODONE-ACETAMINOPHEN 5-325 MG PO TABS
1.0000 | ORAL_TABLET | Freq: Once | ORAL | Status: AC
  Administered 2012-04-26: 1 via ORAL

## 2012-04-26 MED ORDER — BORTEZOMIB CHEMO SQ INJECTION 3.5 MG (2.5MG/ML)
1.3000 mg/m2 | Freq: Once | INTRAMUSCULAR | Status: AC
  Administered 2012-04-26: 2.5 mg via SUBCUTANEOUS
  Filled 2012-04-26: qty 2.5

## 2012-04-26 MED ORDER — SULFAMETHOXAZOLE-TRIMETHOPRIM 800-160 MG PO TABS: 1.0000 | ORAL_TABLET | ORAL | Status: AC

## 2012-04-26 NOTE — Progress Notes (Signed)
[facility]  Telephone:([phone]) [phone] Fax:([phone]) [phone]   OFFICE PROGRESS NOTE   Cc:  METHENEY,CATHERINE, MD  DIAGNOSIS:  IgA kappa multiple myeloma.  She presented with diffuse lytic bone lesions, renal insufficiency, hypercalcemia, anemia.  Her M-spike was 4.06 gm/dL.  Her IgA was elevated at 4,020 mg/dL; IgG 353; IgM 5; serum kappa was 2.11; lambda 0.60; serum kappa to lambda ratio elevated at 3.06.  Spot urine showed elevated kappa:lambda at 579; and positive for Bence Jones protein.   Bone marrow biopsy on 04/08/2012 showed 92% plasma cell.  Cytogenetics showed multiple chromosome 1 abnormalities; additional chromosomal material on chromosome 8p; 14q; loss of chromosome 8 and 13.  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    CURRENT THERAPY:  Started on SQ Velcade 1.3mg /m2 on 04/12/2012; weekly; 3 weeks on; 1 week off.  She started on Revlimid 15mg  PO daily d1-21 on 04/21/2012.  She is also on Dexamethasone 40mg  PO once weekly.   INTERVAL HISTORY: [name] 76 y.o. female returns for hospital follow up.  I met her for the first time during hospital admission when she presented with hypercalcemia, renal insufficiency, anemia, hip pain.  She was discharged to rehab on 04/12/2012 and was discharged home on 04/21/2012.    She said that she still has right worse than left hip pain when she ambulates.  She is afraid of taking Oxycodone with which she was discharged with since it makes her drowsy.  She is afraid to ambulate much due to worsening right hip pain.  She has mild nausea when she tries to eat.  She has Zofran; however, she has not been taking much of Zofran.  She no longer has episodes of confusion compared to prior to admission.  She has fatigue; and spends almost all of awake time in sitting or lying in beds.  She is able to go to the bathroom herself.  However, taking a shower wears her out. She has DOE but no SOB at rest.   Patient denies fever,headache,  visual changes, confusion, drenching night sweats, palpable lymph node swelling, mucositis, odynophagia, dysphagia, nausea vomiting, jaundice, chest pain, palpitation, productive cough, gum bleeding, epistaxis, hematemesis, hemoptysis, abdominal pain, abdominal swelling, early satiety, melena, hematochezia, hematuria, skin rash, spontaneous bleeding, joint swelling, joint pain, heat or cold intolerance, bowel bladder incontinence, back pain, focal motor weakness, paresthesia, depression.    Past Medical History  Diagnosis Date  . Anxiety   . Hypertension   . Hyperlipidemia   . Depression   . Osteopenia   . Multiple myeloma 03/2012    Past Surgical History  Procedure Date  . Breast lumpectomy     Current Outpatient Prescriptions  Medication Sig Dispense Refill  . acyclovir (ZOVIRAX) 200 MG capsule Take 2 capsules (400 mg total) by mouth daily.  60 capsule  1  . ALPRAZolam (XANAX) 1 MG tablet Take 1 mg by mouth at bedtime as needed. Sleep.      . bortezomib SQ (VELCADE) 3.5 MG SOLR Inject 1 mL (2.5 mg total) into the skin once.      . ciprofloxacin (CIPRO) 250 MG tablet Take 1 tablet (250 mg total) by mouth 2 (two) times daily.  13 tablet  0  . cyclobenzaprine (FLEXERIL) 5 MG tablet Take 5 mg by mouth. Take 1 tablet every 8 hours      . dexamethasone (DECADRON) 4 MG tablet Take 10 tablets once a week with chemotherapy drug.  30 tablet  1  .  docusate sodium 100 MG CAPS Take 100 mg by mouth 2 (two) times daily.  10 capsule    . fentaNYL (DURAGESIC - DOSED MCG/HR) 12 MCG/HR Place 2 patches (25 mcg total) onto the skin every 3 (three) days.  10 patch  0  . fentaNYL (DURAGESIC - DOSED MCG/HR) 25 MCG/HR Place 1 patch (25 mcg total) onto the skin every 3 (three) days.  5 patch  0  . lenalidomide (REVLIMID) 15 MG capsule Do not use till cleared by Dr. Gaylyn Rong.  21 capsule  0  . lidocaine (LIDODERM) 5 % Place 2 patches onto the skin daily. Remove & Discard patch within 12 hours or as directed by MD  60  patch  1  . meloxicam (MOBIC) 15 MG tablet Take 15 mg by mouth. Take 1 tablet daily      . ondansetron (ZOFRAN) 4 MG tablet Take 4 mg by mouth. Take 1 tablet every 8 hours as needed for nausea      . oxyCODONE (OXY IR/ROXICODONE) 5 MG immediate release tablet Take 1-2 tablets (5-10 mg total) by mouth every 4 (four) hours as needed.  90 tablet  0  . oxyCODONE-acetaminophen (PERCOCET) 7.5-325 MG per tablet Take by mouth. Take 1 tablet every 4-6 hours as needed.  7.5-325mg       . polyethylene glycol powder (GLYCOLAX/MIRALAX) powder Take as directed      . predniSONE (DELTASONE) 10 MG tablet Take 10 mg by mouth. Take as directed      . prochlorperazine (COMPAZINE) 10 MG tablet Take 10 mg by mouth. Take 1 tablet every 6 hours as directed      . QUEtiapine (SEROQUEL) 100 MG tablet Take 100 mg by mouth. Take 1 tablet daily      . rosuvastatin (CRESTOR) 10 MG tablet Take 10 mg by mouth daily.      . traMADol (ULTRAM) 50 MG tablet Take 50 mg by mouth. Take 1/2 tablet at bedtime      . enoxaparin (LOVENOX) 40 MG/0.4ML injection Inject 0.4 mLs (40 mg total) into the skin daily.  30 Syringe  3  . HYDROcodone-acetaminophen (VICODIN) 5-500 MG per tablet Take 1 tablet by mouth every 6 (six) hours as needed for pain.  100 tablet  0  . sulfamethoxazole-trimethoprim (BACTRIM DS,SEPTRA DS) 800-160 MG per tablet Take 1 tablet by mouth every Monday, Wednesday, and Friday.  30 tablet  3   Current Facility-Administered Medications  Medication Dose Route Frequency Provider Last Rate Last Dose  . HYDROcodone-acetaminophen (NORCO/VICODIN) 5-325 MG per tablet 1 tablet  1 tablet Oral Once Exie Parody, MD   1 tablet at 04/26/12 1001   Facility-Administered Medications Ordered in Other Visits  Medication Dose Route Frequency Provider Last Rate Last Dose  . bortezomib SQ (VELCADE) chemo injection 2.5 mg  1.3 mg/m2 (Treatment Plan Actual) Subcutaneous Once Exie Parody, MD   2.5 mg at 04/26/12 1002  . ondansetron (ZOFRAN) tablet 8  mg  8 mg Oral Once Exie Parody, MD   8 mg at 04/26/12 1001    ALLERGIES:  is allergic to simvastatin.  REVIEW OF SYSTEMS:  The rest of the 14-point review of system was negative.   Filed Vitals:   04/26/12 0821  BP: 144/95  Pulse: 98  Temp: 97 F (36.1 C)   Wt Readings from Last 3 Encounters:  04/26/12 163 lb 1.6 oz (73.982 kg)  04/20/12 169 lb 12.8 oz (77.021 kg)  04/04/12 175 lb 7.8 oz (79.6 kg)  ECOG Performance status: 2  PHYSICAL EXAMINATION:   Gen: slightly obese woman, in no acute distress. Eyes: No scleral icterus or jaundice. ENT: There was no oropharyngeal lesions. Neck was supple without thyromegaly. Lymphatics: Negative for cervical, supraclavicular, axillary, or inguinal adenopathy. Respiratory: Lungs were clear bilaterally without wheezing or crackles. Cardiovascular: normal heart rate and rhythm; S1/S2; without murmur, rubs, or gallop. There was no pedal edema. GI: Abdomen was soft, flat, nontender, nondistended, without organomegaly. Musculoskeletal exam: No spinal tenderness on palpation of vertebral spine. Skin exam was without ecchymosis, petechiae.  She needed help to get out of wheelchair.  She was able to ambulate about 10 feet but with right hip pain.    LABORATORY/RADIOLOGY DATA:  Lab Results  Component Value Date   WBC 4.8 04/26/2012   HGB 9.1* 04/26/2012   HCT 26.5* 04/26/2012   PLT 159 04/26/2012   GLUCOSE 124* 04/21/2012   CHOL 255* 03/03/2012   TRIG 117 03/03/2012   HDL 62 03/03/2012   LDLDIRECT 166* 04/24/2011   LDLCALC 170* 03/03/2012   ALKPHOS 58 04/13/2012   ALT 9 04/13/2012   AST 23 04/13/2012   NA 137 04/21/2012   K 4.9 04/21/2012   CL 100 04/21/2012   CREATININE 1.31* 04/21/2012   BUN 31* 04/21/2012   CO2 17* 04/21/2012   INR 1.33 04/06/2012   HGBA1C 5.6 09/08/2010     ASSESSMENT AND PLAN:   1. History of hypertension:  Off of meds now due to weight loss.  Her BP was slightly elevated today due to right hip pain.  In the future, if despite control of  her hip pain and her BP is still elevated, I may resume her BP meds.  2. Hyperlipidemia:  She is on rosuvastatin per PCP.  3. Acute renal failure due to hypercalcemia and suspected multiple myeloma: resolved with IV fluid.  Her Cr is pending today.  4. Hypercalcemia: Due to suspected multiple myeloma: resolved with IVF and pamidronate during admission early July 2013.  5. Moderate deconditioning due to bone pain: s/p in patient rehab.  Once her pain and myeloma disease control improves, if she still has deconditioning, I may consider refer her to outpatient rehab.  6. Lytic bone lesions causing bone pain: From suspected multiple myeloma:  Still has right hip pain despite chemo.  I refer her to Rad Onc for evaluation for palliative chemo.  FYI for Rad Onc, her current cycle of chemo will finish on 05/12/2012.  She should not be on concurrent chemo and radiation due to high risk of severe cytopenia (with 92% plasma cell on bone marrow biopsy).   Thus, the week of 05/12/12 through 05/19/12 is the best time for her to receive radiation.   7.  Normocytic anemia: work up in the recent past did not show iron/VitB12 deficiency or hypothyroidism.  Her anemia is due to myeloma and chemo.  There is no active bleeding.  There is no indication for transfusion. 8. Acute subdural hematoma from recent fall during hospital stay in July 2013: Resolved.  She is on Prednisone taper.  She and her relatives do not know how much Prednisone she is on.  I advised them to bring in their bottles of medications over the next few days for [name] to taper.  9.  Nausea:  Due to chemo.  I advised her to take Zofran prn more routinely to eat more. 10.  Moderate calorie/protein malnutrition:  From myeloma, and its treatment.  I advised her to take Zofran to  allow her to tolerate PO intake better.  11.  Multiple myeloma:  * Treatment:   - Velcade subcutaneous once a week; 3 weeks on, 1 week off.  - Revlimid 15mg  capsule, by mouth; once daily;  21 days in a row; 7 days off.   - Dexamethasone 40mg  (ten of 4mg  tablets), by mouth; once a week; every week (including the week off of chemo).   *  Preventative medications while on chemotherapy  - Acyclovir 200 mg, by mouth, once daily, everyday (to prevent viral infection).  - Bactrim double strength (DS), every Monday, Wednesday, Friday (to prevent bacterial infection).  - Lovenox injection, subcutaneously; everyday (to prevent blood clot).   * The 2nd cycle will be delayed a little bit to coincide the oral and injection chemo since they were not started at the same time for the first cycle, and give you time to get radiation to the right hip.  We can not give radiation and chemo at the same time since it can cause very low blood count.   12.  Follow up:  - Lab/RV with my nurse practitioner 05/05/12 to see how the pain is doing.  - Lab only appointment on 05/16/12 to see how the myeloma is doing and also check CBC prior to ordering the next cycle of Revlmidid.    - See me on 05/19/12 to start 2nd cycle of chemo.   This appointment may be delayed depending on when she receives palliative radiation.      The length of time of the face-to-face encounter was 40 minutes. More than 50% of time was spent counseling and coordination of care.

## 2012-04-26 NOTE — Patient Instructions (Addendum)
1. Diagnosis:  Multiple myeloma.  2.  Treatment:   - Velcade subcutaneous once a week; 3 weeks on, 1 week off.  - Revlimid 15mg  capsule, by mouth; once daily; 21 days in a row; 7 days off.   - Dexamethasone 40mg  (ten of 4mg  tablets), by mouth; once a week; every week (including the week off of chemo).   3.  Preventative medications while on chemotherapy  - Acyclovir 200 mg, by mouth, once daily, everyday (to prevent viral infection).  - Bactrim double strength (DS), every Monday, Wednesday, Friday (to prevent bacterial infection).  - Lovenox injection, subcutaneously; everyday (to prevent blood clot).   4.  The 2nd cycle will be delayed a little bit to coincide the oral and injection chemo since they were not started at the same time for the first cycle, and give you time to get radiation to the right hip.  We can not give radiation and chemo at the same time since it can cause very low blood count.   5.  Right hip pain:    - Please come home and bring ALL your medication to me today so that we can adjust your pain medication.  - I will refer you to radiation doctor to see if your hip pain can improve.   6.  Follow up:  - Lab/RV with my nurse practitioner 05/05/12 to see how your pain is doing.  - Lab only appointment on 05/16/12 to see how your myeloma is doing and also check your blood count.  - See me on 05/19/12 to start 2nd cycle of chemo.    HANG IN THERE!   WALK MORE!   EAT MORE!

## 2012-04-26 NOTE — Telephone Encounter (Signed)
The patient's uti is sensitive to cipro which we placed her on in the hospital

## 2012-04-26 NOTE — Progress Notes (Signed)
Contacted patient's pharmacy [facility]--[phone]); patient's insurance to pay for new Rx of Lovenox 40 mg with co-pay of $38.05; patient aware.

## 2012-04-26 NOTE — Telephone Encounter (Signed)
Gave pt appt for August 2013 lab , chemo, Radiation ,ML, MD

## 2012-04-27 LAB — COMPREHENSIVE METABOLIC PANEL
ALT: 10 U/L (ref 0–35)
AST: 11 U/L (ref 0–37)
Albumin: 2.8 g/dL — ABNORMAL LOW (ref 3.5–5.2)
Alkaline Phosphatase: 48 U/L (ref 39–117)
Calcium: 10.1 mg/dL (ref 8.4–10.5)
Chloride: 99 mEq/L (ref 96–112)
Potassium: 4.4 mEq/L (ref 3.5–5.3)
Sodium: 134 mEq/L — ABNORMAL LOW (ref 135–145)
Total Protein: 9.6 g/dL — ABNORMAL HIGH (ref 6.0–8.3)

## 2012-04-28 ENCOUNTER — Encounter: Payer: Self-pay | Admitting: Radiation Oncology

## 2012-04-28 DIAGNOSIS — M899 Disorder of bone, unspecified: Secondary | ICD-10-CM | POA: Insufficient documentation

## 2012-04-29 ENCOUNTER — Encounter: Payer: Self-pay | Admitting: *Deleted

## 2012-04-29 ENCOUNTER — Encounter: Payer: Self-pay | Admitting: Radiation Oncology

## 2012-04-29 ENCOUNTER — Ambulatory Visit
Admission: RE | Admit: 2012-04-29 | Discharge: 2012-04-29 | Disposition: A | Discharge status: Home / Self Care | Payer: Medicare Other | Source: Ambulatory Visit | Attending: Radiation Oncology | Admitting: Radiation Oncology

## 2012-04-29 ENCOUNTER — Telehealth: Payer: Self-pay | Admitting: *Deleted

## 2012-04-29 VITALS — BP 130/80 | HR 94 | Temp 97.3°F | Resp 20 | Wt 165.4 lb

## 2012-04-29 DIAGNOSIS — S065X9A Traumatic subdural hemorrhage with loss of consciousness of unspecified duration, initial encounter: Secondary | ICD-10-CM

## 2012-04-29 DIAGNOSIS — C9030 Solitary plasmacytoma not having achieved remission: Secondary | ICD-10-CM

## 2012-04-29 DIAGNOSIS — E785 Hyperlipidemia, unspecified: Secondary | ICD-10-CM | POA: Insufficient documentation

## 2012-04-29 DIAGNOSIS — Z79899 Other long term (current) drug therapy: Secondary | ICD-10-CM | POA: Insufficient documentation

## 2012-04-29 DIAGNOSIS — I10 Essential (primary) hypertension: Secondary | ICD-10-CM | POA: Insufficient documentation

## 2012-04-29 DIAGNOSIS — C9000 Multiple myeloma not having achieved remission: Secondary | ICD-10-CM | POA: Insufficient documentation

## 2012-04-29 DIAGNOSIS — C801 Malignant (primary) neoplasm, unspecified: Secondary | ICD-10-CM | POA: Insufficient documentation

## 2012-04-29 DIAGNOSIS — Z510 Encounter for antineoplastic radiation therapy: Secondary | ICD-10-CM | POA: Insufficient documentation

## 2012-04-29 HISTORY — DX: Malignant (primary) neoplasm, unspecified: C80.1

## 2012-04-29 HISTORY — DX: Unspecified osteoarthritis, unspecified site: M19.90

## 2012-04-29 NOTE — Telephone Encounter (Signed)
Called pt's husband to f/u on Lovenox, ask if needed teaching on administering.  He states that RN in RadOnc taught them how to do the injections this morning.  He verbalized understanding of daily [place] injections, denies need for any further teaching at this time.  Instructed to call for any questions/concerns prior to next visit.  He verbalized understanding.

## 2012-04-29 NOTE — Progress Notes (Signed)
Please see the Nurse Progress Note in the MD Initial Consult Encounter for this patient. 

## 2012-04-29 NOTE — Progress Notes (Signed)
[facility] Clinical Social Work  Clinical Social Work met with [name], [name] and son at cancer center to complete healthcare advance directives. [name] chose to only complete the healthcare living will.  CSW will send notarized documents to medical records to be scanned into her chart.  To access his directives, go to Chart Review > Media Tab > under Advance Directives/Clinical Social Work.  Kathrin Penner, MSW, [facility] Clinical Social Worker [facility] [phone]

## 2012-04-29 NOTE — Progress Notes (Signed)
Patient new consult here   With Multiple myeloma with, , bone lesions in w/c ,just d/c from rehab , fatigued, slow unsteady gait, pain right hip > left hip and back "8", on lidodermm patches and duragesic patch , took  Took vicodin pain med at 7am, patient pain relieved with lying on stretcher  Chair, alert,oriented x3, spouse and son with patient Ate watermelon,peaches, and a can of ensure  Married, 1 son, no family history of cancer    Allergies: simvastatin=intolerance, joint pain

## 2012-04-30 NOTE — Progress Notes (Signed)
Radiation Oncology         [phone] ________________________________  Name: [name] MRN: [id number]  Date: 04/29/2012  DOB: 1936-07-06  WU:[id number],WGNFAOZHY, MD  Exie Parody, MD     REFERRING PHYSICIAN: Exie Parody, MD   DIAGNOSIS: The primary encounter diagnosis was Acute subdural hematoma. Diagnoses of Plasmacytoma of bone and Multiple myeloma were also pertinent to this visit.   HISTORY OF PRESENT ILLNESS::[name] is a 76 y.o. female who is seen for an initial consultation visit. The patient is seen today for consideration of palliative radiation treatment regarding her recent diagnosis of multiple myeloma. The patient initially was admitted to the hospital in the setting of hypercalcemia and renal insufficiency. She also had anemia and hip pain and was found to have diffuse lytic lesions. The patient's lab work was suggestive of possible myeloma and a bone marrow biopsy was completed on 04/08/2012 that showed 92% plasma cells. Cytogenetics were also performed.  The patient's imaging has included x-rays of the hips bilaterally on 04/04/2012. The appearance was compatible with widespread multiple myeloma or bone metastases. Myeloma was favored. An MRI scan of the lumbar spine was also completed on this date and this showed innumerable lesions throughout the entire visualized portion of the skeleton which is also consistent with multiple myeloma.  The patient has begun systemic treatment with Velcade and Revlimid in addition to dexamethasone. Her schedule is 3 weeks on and one-week off. The patient indicates that her pain which has been diffuse has improved with systemic treatment. However she does continued to complain of significant right hip pain. She is lying down today in the clinic and she states that this helps her pain and she does not have any pain in this position. Other activities including riding in a car do exacerbate this pain. Given the persistent severe pain in  the right hip region I been asked to see her for consideration of possible radiation treatment to the right hip.   PREVIOUS RADIATION THERAPY: No   PAST MEDICAL HISTORY:  has a past medical history of Anxiety; Hypertension; Hyperlipidemia; Depression; Osteopenia; Multiple myeloma (03/2012); Acute renal failure; Lytic bone lesions on xray; Acute subdural hematoma (03/2012 ); Normocytic anemia; Cancer; and Arthritis.     PAST SURGICAL HISTORY: Past Surgical History  Procedure Date  . Breast lumpectomy   . 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 Tonsillectomy     age 58     FAMILY HISTORY: family history includes Diabetes in her sisters; Heart disease in her mother; Hyperlipidemia in her sisters; and Hypertension in her sisters.  There is no history of Sudden death and Heart attack.   SOCIAL HISTORY:  reports that she has never smoked. She has never used smokeless tobacco. She reports that she does not drink alcohol or use illicit drugs.   ALLERGIES: Simvastatin   MEDICATIONS:  Current Outpatient Prescriptions  Medication Sig Dispense Refill  . acyclovir (ZOVIRAX) 200 MG capsule Take 2 capsules (400 mg total) by mouth daily.  60 capsule  1  . bortezomib SQ (VELCADE) 3.5 MG SOLR Inject 1 mL (2.5 mg total) into the skin once.      . ciprofloxacin (CIPRO) 250 MG tablet Take 1 tablet (250 mg total) by mouth 2 (two) times daily.  13 tablet  0  . dexamethasone (DECADRON) 4 MG tablet Take 10 tablets once a week with chemotherapy drug.  30 tablet  1  . enoxaparin (LOVENOX) 40 MG/0.4ML injection Inject 0.4 mLs (40 mg total) into the skin daily.  30 Syringe  3  . fentaNYL (DURAGESIC - DOSED MCG/HR) 12 MCG/HR Place 2 patches (25 mcg total) onto the skin every 3 (three) days.  10 patch  0  . fentaNYL (DURAGESIC - DOSED MCG/HR) 25 MCG/HR Place 1 patch (25 mcg total) onto the skin every 3 (three) days.  5  patch  0  . HYDROcodone-acetaminophen (VICODIN) 5-500 MG per tablet Take 1 tablet by mouth every 6 (six) hours as needed for pain.  100 tablet  0  . lenalidomide (REVLIMID) 15 MG capsule Do not use till cleared by Dr. Gaylyn Rong.  21 capsule  0  . lidocaine (LIDODERM) 5 % Place 2 patches onto the skin daily. Remove & Discard patch within 12 hours or as directed by MD  60 patch  1  . ondansetron (ZOFRAN) 4 MG tablet Take 4 mg by mouth. Take 1 tablet every 8 hours as needed for nausea      . oxyCODONE (OXY IR/ROXICODONE) 5 MG immediate release tablet Take 1-2 tablets (5-10 mg total) by mouth every 4 (four) hours as needed.  90 tablet  0  . oxyCODONE-acetaminophen (PERCOCET) 7.5-325 MG per tablet Take by mouth. Take 1 tablet every 4-6 hours as needed.  7.5-325mg       . polyethylene glycol powder (GLYCOLAX/MIRALAX) powder Take as directed      . prochlorperazine (COMPAZINE) 10 MG tablet Take 10 mg by mouth. Take 1 tablet every 6 hours as directed      . rosuvastatin (CRESTOR) 10 MG tablet Take 10 mg by mouth daily.      Marland Kitchen sulfamethoxazole-trimethoprim (BACTRIM DS,SEPTRA DS) 800-160 MG per tablet Take 1 tablet by mouth every Monday, Wednesday, and Friday.  30 tablet  3  . ALPRAZolam (XANAX) 1 MG tablet Take 1 mg by mouth at bedtime as needed. Sleep.      . cyclobenzaprine (FLEXERIL) 5 MG tablet Take 5 mg by mouth. Take 1 tablet every 8 hours      . docusate sodium 100 MG CAPS Take 100 mg by mouth 2 (two) times daily.  10 capsule    . meloxicam (MOBIC) 15 MG tablet Take 15 mg by mouth. Take 1 tablet daily      . predniSONE (DELTASONE) 10 MG tablet Take 10 mg by mouth. Take as directed      . QUEtiapine (SEROQUEL) 100 MG tablet Take 100 mg by mouth. Take 1 tablet daily      . traMADol (ULTRAM) 50 MG tablet Take 50 mg by mouth. Take 1/2 tablet at bedtime         REVIEW OF SYSTEMS:  A 15 point review of systems is documented in the electronic medical record. This was obtained by the nursing staff. However, I  reviewed this with the patient to discuss relevant findings and make appropriate changes.  Pertinent items are noted in HPI.    PHYSICAL EXAM:  weight is 165 lb 6.4 oz (75.025 kg). Her oral temperature is 97.3 F (36.3 C). Her blood pressure is 130/80 and her pulse is 94. Her respiration is 20 and oxygen saturation is 98%.   General: Well-developed, in no acute distress, lying down on the exam table throughout much of the exam, patient was able to answer questions appropriately HEENT: Normocephalic, atraumatic; oral cavity clear Neck: Supple without any lymphadenopathy Cardiovascular: Regular rate and rhythm Respiratory: Clear to auscultation bilaterally GI: Soft, nontender,  normal bowel sounds Extremities: No edema present, pain in the lateral right hip increases slightly it appears with pressure. Otherwise no tenderness to palpation throughout the back and hips region. Neuro: No focal deficits    LABORATORY DATA:  Lab Results  Component Value Date   WBC 4.8 04/26/2012   HGB 9.1* 04/26/2012   HCT 26.5* 04/26/2012   MCV 90.7 04/26/2012   PLT 159 04/26/2012   Lab Results  Component Value Date   NA 134* 04/26/2012   K 4.4 04/26/2012   CL 99 04/26/2012   CO2 19 04/26/2012   Lab Results  Component Value Date   ALT 10 04/26/2012   AST 11 04/26/2012   ALKPHOS 48 04/26/2012   BILITOT 0.4 04/26/2012      RADIOGRAPHY: Dg Orthopantogram  04/04/2012  *RADIOLOGY REPORT*  Clinical Data: 76 year old female.  Dental Pathology.  ORTHOPANTOGRAM/PANORAMIC  Comparison: None.  Findings: Residual dentition (incisors, canine, and left maxillary molar) demonstrate filling with no definite dental caries.  No peri apical lucency.  The remaining dentition is absent.  IMPRESSION: Dental fillings in the residual dentition.  No definite dental caries or peri apical lucency.  Original Report Authenticated By: Harley Hallmark, M.D.   X-ray Chest Pa And Lateral   04/04/2012  *RADIOLOGY REPORT*  Clinical Data:  76 year old female with cough shortness of breath chest pain.  CHEST - 2 VIEW  Comparison: 04/11/2007.  Lumbar MRI from 04/04/2012.  Findings: Upright AP and lateral views of the chest.  There is a peripheral right lung 6 cm mass-like opacity.  This may be pleural based or might be extrapleural.  Mediastinal and hilar contours appear stable and within normal limits. The anterior ribs in the region of the mass are indistinct.  The left lung appears stable and clear. Visualized tracheal air column is within normal limits.  No definite other osseous lesion identified.  IMPRESSION: 1.  Mass-like opacity in the right hemithorax, may be pleural based or could be extrapleural (indeterminate adjacent rib involvement). In light of the lumbar findings earlier today, top differential considerations are multiple myeloma versus bronchogenic carcinoma of the right chest.  Chest CT (IV contrast preferred) would characterize further. 2.  No other cardiopulmonary abnormality identified.  Original Report Authenticated By: Harley Hallmark, M.D.   Dg Hip Bilateral W/pelvis  04/04/2012  *RADIOLOGY REPORT*  Clinical Data: 76 year old female with pain.  Abnormal lumbar MRI.  BILATERAL HIP WITH PELVIS - 4+ VIEW  Comparison: Lumbar MRI from the same day reported separately. Lumbar radiographs 03/19/2012.  Findings: A subtle but asymmetric decreased bone mineralization of the proximal right femur.  A small (3-4 mm) and lucent lesions are noted in both intertrochanteric regions.  No proximal femoral pathologic fracture is identified.  There may be a small lucent lesions of the left iliac crest versus overlying bowel gas.  No pelvic fracture is identified.  Bone mineralization in the pelvis is similar to that seen in the visible lumbar spine.  IMPRESSION: Decreased bone mineralization in the pelvis and proximal femurs similar to that in the lumbar spine with possible superimposed small lucent lesions, but no pathologic fracture identified.  Appearance compatible with widespread multiple myeloma (favored) or bone metastases.  Original Report Authenticated By: Harley Hallmark, M.D.   Ct Head Wo Contrast  04/07/2012  *RADIOLOGY REPORT*  Clinical Data: Follow-up acute subdural hematoma.  CT HEAD WITHOUT CONTRAST  Technique:  Contiguous axial images were obtained from the base of the skull through the vertex without contrast.  Comparison: Prior scans from 04/06/2012.  Findings: Considerable motion degradation.  Overall study marginally diagnostic.  Slight improvement acute right subdural hematoma.  Thickness as measured at the level of the corpus callosum over the right frontotemporal convexity image 17 is 7.4 mm as compared with 10 - 11 mm previously.  Slight midline shift of 3-4 mm also appears improved.  Aside from midline shift, the brain is grossly within normal limits although motion significantly limits detection.  IMPRESSION: Slight improvement acute right subdural.  As measured on image 17, the hematoma thickness in the right frontotemporal region is 7 mm, decreased from 10-11 mm.  Original Report Authenticated By: Elsie Stain, M.D.   Ct Head Wo Contrast  04/06/2012  *RADIOLOGY REPORT*  Clinical Data: Follow-up subdural hematoma.  CT HEAD WITHOUT CONTRAST  Technique:  Contiguous axial images were obtained from the base of the skull through the vertex without contrast.  Comparison: CT head earlier in the day.  Findings: Predominantly acute right subdural hematoma overlying the frontal and parietal lobes extending to the convexity is redemonstrated.  Thickness as measured on image 22 is 12 mm in  the right posterior frontal region, unchanged from the scan 6 hours earlier. Midline shift as measured at the level of the septum pellucidum (image 15) is 7 mm right-to-left, also similar to priors.  There is mild atrophy with chronic microvascular ischemic change. Right-sided scalp hematoma redemonstrated.  Multiple punched out lesions in the   calvarium are indeterminate with differential as previously discussed for myeloma versus possible metastatic disease.  IMPRESSION: Stable predominately acute right subdural hematoma.  12 mm thick as measured on image 22 right posterior frontal region.  7 mm right-to- left midline shift.  Original Report Authenticated By: Elsie Stain, M.D.   Ct Head Wo Contrast  04/06/2012  *RADIOLOGY REPORT*  Clinical Data:  Status post fall; hit posterior head.  Concern for head or cervical spine injury.  CT HEAD WITHOUT CONTRAST AND CT CERVICAL SPINE WITHOUT CONTRAST  Technique:  Multidetector CT imaging of the head and cervical spine was performed following the standard protocol without intravenous contrast.  Multiplanar CT image reconstructions of the cervical spine were also generated.  Comparison: CT of the head performed 01/06/2005  CT HEAD  Findings:  There is a predominantly acute subdural hematoma overlying the right frontal parietal lobes, extending to the convexity.  This measures approximately 1.1 cm at the dominant acute component along the right parietal lobe, and up to 1.6 cm at the convexity.  Associated mass effect is noted, with up to 9 mm of leftward midline shift. There is no evidence of hydrocephalus.  No additional hemorrhage is identified.  There is no evidence of mass lesion; no acute infarct is identified.  Mild subcortical and periventricular white matter change likely reflects small vessel ischemic microangiopathy.  Mild cerebellar atrophy is noted.  The brainstem and fourth ventricle are within normal limits.  The basal ganglia are unremarkable in appearance.  There is no evidence of fracture; scattered small lytic foci within the upper calvarium may reflect multiple myeloma or less likely metastatic disease.  The orbits are within normal limits.  The paranasal sinuses and mastoid air cells are well-aerated.  Minimal soft tissue swelling is noted on the right side near the vertex.  IMPRESSION:  1.   Predominantly acute subdural hematoma overlying the right frontal and parietal lobes, extending to the convexity.  This measures 1.1 cm at the dominant acute component along the right parietal lobe, and  up to 1.6 cm at the convexity.  Associated mass effect, with up to 9 mm of leftward midline shift. 2.  Minimal soft tissue swelling noted on the right side near the vertex. 3.  Scattered small lytic foci within the upper calvarium may reflect multiple myeloma or less likely metastatic disease. 4.  Mild small vessel ischemic microangiopathy.  CT CERVICAL SPINE  Findings: There is no evidence of acute fracture or subluxation. There is minimal grade 1 anterolisthesis of C4 on C5, and mild narrowing of the intervertebral disc spaces at C5-C6 and C6-C7, with small associated anterior and posterior disc osteophyte complexes.  Vertebral bodies otherwise demonstrate normal height and alignment. Prevertebral soft tissues are within normal limits.  The thyroid gland is unremarkable in appearance.  The visualized lung apices are clear.  Calcification is noted at the carotid bifurcations bilaterally.  IMPRESSION:  1.  No evidence of acute fracture or subluxation along the cervical spine. 2.  Mild degenerative change noted along the lower cervical spine. 3.  Calcification noted at the carotid bifurcations bilaterally. Carotid ultrasound could be considered for further evaluation on an elective non-emergent basis, when and as deemed clinically appropriate.  Critical Value/emergent results were called by telephone at the time of interpretation on 04/06/2012  at 06:41 a.m.  to  Tama Gander NP, who verbally acknowledged these results.  Original Report Authenticated By: Tonia Ghent, M.D.   Ct Chest Wo Contrast  04/06/2012  *RADIOLOGY REPORT*  Clinical Data: Right lung mass.  CT CHEST WITHOUT CONTRAST  Technique:  Multidetector CT imaging of the chest was performed following the standard protocol without IV contrast.   Comparison: Chest x-ray dated 04/04/2012  Findings: There is a 4.4 x 5.7 x 5.2 cm mass destroying the lateral aspect of the right seventh rib.  It extends into the right hemithorax and compresses the adjacent lung.   There is no hilar or mediastinal adenopathy.  Minimal atelectasis at the left lung base.  Tiny right effusion.  There are multiple subtle tiny lesions in the thoracic spine consistent with the lesions seen on the lumbar MRI dated 04/04/2012.  The heart is at the upper limits of normal in size.  There is a tiny hiatal hernia.  Visualized portion of the upper abdomen is normal.  IMPRESSION:  1.  Numerous tiny lesions throughout the thoracic spine consistent with multiple myeloma. 2.  Large soft tissue mass destroying the lateral aspect of the right seventh rib, most likely a large lesion of multiple myeloma (plasmocytoma).  Original Report Authenticated By: Gwynn Burly, M.D.   Ct Cervical Spine Wo Contrast  04/06/2012  *RADIOLOGY REPORT*  Clinical Data:  Status post fall; hit posterior head.  Concern for head or cervical spine injury.  CT HEAD WITHOUT CONTRAST AND CT CERVICAL SPINE WITHOUT CONTRAST  Technique:  Multidetector CT imaging of the head and cervical spine was performed following the standard protocol without intravenous contrast.  Multiplanar CT image reconstructions of the cervical spine were also generated.  Comparison: CT of the head performed 01/06/2005  CT HEAD  Findings:  There is a predominantly acute subdural hematoma overlying the right frontal parietal lobes, extending to the convexity.  This measures approximately 1.1 cm at the dominant acute component along the right parietal lobe, and up to 1.6 cm at the convexity.  Associated mass effect is noted, with up to 9 mm of leftward midline shift. There is no evidence of hydrocephalus.  No additional hemorrhage is identified.  There is no evidence  of mass lesion; no acute infarct is identified.  Mild subcortical and  periventricular white matter change likely reflects small vessel ischemic microangiopathy.  Mild cerebellar atrophy is noted.  The brainstem and fourth ventricle are within normal limits.  The basal ganglia are unremarkable in appearance.  There is no evidence of fracture; scattered small lytic foci within the upper calvarium may reflect multiple myeloma or less likely metastatic disease.  The orbits are within normal limits.  The paranasal sinuses and mastoid air cells are well-aerated.  Minimal soft tissue swelling is noted on the right side near the vertex.  IMPRESSION:  1.  Predominantly acute subdural hematoma overlying the right frontal and parietal lobes, extending to the convexity.  This measures 1.1 cm at the dominant acute component along the right parietal lobe, and up to 1.6 cm at the convexity.  Associated mass effect, with up to 9 mm of leftward midline shift. 2.  Minimal soft tissue swelling noted on the right side near the vertex. 3.  Scattered small lytic foci within the upper calvarium may reflect multiple myeloma or less likely metastatic disease. 4.  Mild small vessel ischemic microangiopathy.  CT CERVICAL SPINE  Findings: There is no evidence of acute fracture or subluxation. There is minimal grade 1 anterolisthesis of C4 on C5, and mild narrowing of the intervertebral disc spaces at C5-C6 and C6-C7, with small associated anterior and posterior disc osteophyte complexes.  Vertebral bodies otherwise demonstrate normal height and alignment. Prevertebral soft tissues are within normal limits.  The thyroid gland is unremarkable in appearance.  The visualized lung apices are clear.  Calcification is noted at the carotid bifurcations bilaterally.  IMPRESSION:  1.  No evidence of acute fracture or subluxation along the cervical spine. 2.  Mild degenerative change noted along the lower cervical spine. 3.  Calcification noted at the carotid bifurcations bilaterally. Carotid ultrasound could be considered  for further evaluation on an elective non-emergent basis, when and as deemed clinically appropriate.  Critical Value/emergent results were called by telephone at the time of interpretation on 04/06/2012  at 06:41 a.m.  to  Tama Gander NP, who verbally acknowledged these results.  Original Report Authenticated By: Tonia Ghent, M.D.   Mr Lumbar Spine Wo Contrast  04/04/2012  *RADIOLOGY REPORT*  Clinical Data: Low back pain and lower extremity weakness.  MRI LUMBAR SPINE WITHOUT CONTRAST  Technique:  Multiplanar and multiecho pulse sequences of the lumbar spine were obtained without intravenous contrast.  Comparison: Radiographs dated 03/19/2012 and CT scan of the abdomen dated 10/05/2008  Findings: The scan extends from T11-12 through S3.  Tip of the conus is at L1 and appears normal.  There are innumerable lesions throughout the visualized bones. There is a pathologic Schmorl's node in the inferior aspect of L3. The appearance is most typical for multiple myeloma.  T11-12:  The disc is normal.  T12-L1:  There is a Schmorl's node in the inferior endplate of T12 which may be related to the numerous lesions.  No disc bulging.  L1-2:  Small annular disc tear asymmetric bulge of the left of midline with no neural impingement.  L2-3:  Broad-based disc bulge with a small protrusion into the right neural foramen.  Right L2 nerve exits without impingement. Slight narrowing of the spinal canal and right lateral recess without focal neural impingement.  L3-4: There is a 5 mm retrolisthesis of L3 on L4 with broad-based bulge of the disc narrowing the spinal canal slightly without focal neural impingement.  L4-5:  Slight disc space narrowing with a tiny broad-based disc bulge with osteophyte formation and slight hypertrophy of the left facet joint and ligamentum flavum.  L5-S1:  The disc is normal.  Moderate right and mild left facet arthritis.  There are numerous lesions in the sacrum.  Incidental note is made of a 2.7  cm gallstone.  Gallbladder wall is not thickened.  IMPRESSION: 1.  There are innumerable lesions throughout the entire visualized portion of the skeleton, most likely multiple myeloma. Metastatic cancer can give the same appearance. 2.  Probable pathologic Schmorl's nodes in the inferior aspects of T12 and L3. 3.  No significant neural impingement in the lumbar spine.  Critical Value/emergent results were called by telephone at the time of interpretation on 04/04/2012  at 12:19 p.m.  to  Dr. Vassie Loll, who verbally acknowledged these results.  Original Report Authenticated By: Gwynn Burly, M.D.   US Renal  04/04/2012  *RADIOLOGY REPORT*  Clinical Data: Renal failure.  Hypertension.  RENAL/URINARY TRACT ULTRASOUND COMPLETE  Comparison:  CT of 10/05/2008  Findings:  Right Kidney:  10.6 cm. No hydronephrosis.  Normal renal cortical thickness and echogenicity.  Left Kidney:  11.1 cm. No hydronephrosis.  Normal renal cortical thickness and echogenicity.  9 mm cyst or minimally complex cyst in the lower pole left kidney.  Bladder:  Within normal limits.  Incidental note is made of a 2.5 cm gallstone, without wall thickening or pericholecystic fluid.  IMPRESSION:  1. No acute process or explanation for renal failure. 2.  Cholelithiasis.  Original Report Authenticated By: Consuello Bossier, M.D.   Ct Biopsy  04/08/2012  *RADIOLOGY REPORT*  Clinical history: 76 year old with findings suspicious for myeloma.   PROCEDURE(S): CT GUIDED BONE MARROW ASPIRATES AND BIOPSY  Physician: Rachelle Hora. Lowella Dandy, MD   Medications: Versed 1.5 mg, Fentanyl 25 mcg. A radiology nurse monitored the patient for moderate sedation.  Sedation time:  18 minutes   Procedure: The procedure was explained to the patient. The risks and benefits of the procedure were discussed and the patient's questions were addressed.  Informed consent was obtained from the patient. The patient was placed prone on CT scan. Images of the pelvis were obtained. The  right side of back was prepped and draped in sterile fashion. The skin and right posterior iliac bone were anesthetized with 1% lidocaine.   11 gauge bone needle was directed into the right iliac bone with CT guidance. Two aspirates and two core biopsy obtained.   Findings: Needle placement within the right posterior iliac bone.  Complications: None   Impression: CT guided bone marrow aspirates and core biopsy.  Original Report Authenticated By: Richarda Overlie, M.D.       IMPRESSION: 76 year old female with a recent diagnosis of multiple myeloma. She has begun systemic treatment and has had improvement in her pain overall but she does continue to complain of significant right hip pain. The patient does have apparent disease in this location, among other areas, which is likely contributing to her pain.   PLAN: I believe that the patient is an appropriate candidate for a palliative course of radiation to the right hip. I don't see other areas on imaging or from talking her today which would require treatment also. We therefore did discuss a short course of palliative radiation to this area. I believe that this would be well tolerated. We did discuss the possible side effects and risks of such a treatment which would include but not be limited  to fatigue and skin irritation. Dr. Gaylyn Rong is managing her systemic treatment and he would like to coordinate her course of radiation treatment during her off week due to the risk of cytopenia. He therefore feels that the week of 05/12/2012 through 05/19/2012 would be the best time for her to receive radiation. I believe that this is feasible and therefore I discussed the patient proceeding with a simulation at the appropriate time to allow this, and I believe that we can design a fractionation scheme for her which would fall into this timeframe and afford a good opportunity for symptomatic improvement in this area.  The patient does wish to proceed with this treatment. Therefore  I look forward to seeing the patient in the near future to begin treatment planning for the states.    I spent 60 minutes minutes face to face with the patient and more than 50% of that time was spent in counseling and/or coordination of care.    ________________________________   Radene Gunning, MD, PhD

## 2012-05-02 ENCOUNTER — Telehealth: Payer: Self-pay | Admitting: *Deleted

## 2012-05-02 NOTE — Telephone Encounter (Signed)
Received refill request for Cipro from [facility] .   Per Dr. Gaylyn Rong,  Pt had completed the course of Cipro for her UTI,  And furthermore,  Pt was given antibiotics by the PA at Dr. Riley Kill office.   NO refills per Dr. Gaylyn Rong.  Faxed request back to [facility] with Dr. Lodema Pilot instructions. [facility]   Phone     [phone]  ;    Fax      [phone].

## 2012-05-03 ENCOUNTER — Telehealth: Payer: Self-pay | Admitting: *Deleted

## 2012-05-03 ENCOUNTER — Other Ambulatory Visit: Payer: Self-pay | Admitting: Family Medicine

## 2012-05-03 ENCOUNTER — Emergency Department (HOSPITAL_COMMUNITY): Payer: Medicare Other

## 2012-05-03 ENCOUNTER — Inpatient Hospital Stay (HOSPITAL_COMMUNITY)
Admission: EM | Admit: 2012-05-03 | Discharge: 2012-05-04 | DRG: 309 | Disposition: A | Discharge status: Skilled Nursing Facility | Payer: Medicare Other | Attending: Family Medicine | Admitting: Family Medicine

## 2012-05-03 ENCOUNTER — Encounter (HOSPITAL_COMMUNITY): Payer: Self-pay | Admitting: *Deleted

## 2012-05-03 DIAGNOSIS — E785 Hyperlipidemia, unspecified: Secondary | ICD-10-CM | POA: Diagnosis present

## 2012-05-03 DIAGNOSIS — R5383 Other fatigue: Secondary | ICD-10-CM

## 2012-05-03 DIAGNOSIS — D649 Anemia, unspecified: Secondary | ICD-10-CM

## 2012-05-03 DIAGNOSIS — T451X5A Adverse effect of antineoplastic and immunosuppressive drugs, initial encounter: Secondary | ICD-10-CM | POA: Diagnosis present

## 2012-05-03 DIAGNOSIS — Z9221 Personal history of antineoplastic chemotherapy: Secondary | ICD-10-CM

## 2012-05-03 DIAGNOSIS — C9000 Multiple myeloma not having achieved remission: Secondary | ICD-10-CM

## 2012-05-03 DIAGNOSIS — I4891 Unspecified atrial fibrillation: Secondary | ICD-10-CM

## 2012-05-03 DIAGNOSIS — F3289 Other specified depressive episodes: Secondary | ICD-10-CM | POA: Diagnosis present

## 2012-05-03 DIAGNOSIS — K5900 Constipation, unspecified: Secondary | ICD-10-CM | POA: Diagnosis present

## 2012-05-03 DIAGNOSIS — R5381 Other malaise: Secondary | ICD-10-CM

## 2012-05-03 DIAGNOSIS — Z923 Personal history of irradiation: Secondary | ICD-10-CM

## 2012-05-03 DIAGNOSIS — F329 Major depressive disorder, single episode, unspecified: Secondary | ICD-10-CM | POA: Diagnosis present

## 2012-05-03 DIAGNOSIS — M479 Spondylosis, unspecified: Secondary | ICD-10-CM | POA: Diagnosis present

## 2012-05-03 DIAGNOSIS — M949 Disorder of cartilage, unspecified: Secondary | ICD-10-CM | POA: Diagnosis present

## 2012-05-03 DIAGNOSIS — I10 Essential (primary) hypertension: Secondary | ICD-10-CM

## 2012-05-03 DIAGNOSIS — Z8249 Family history of ischemic heart disease and other diseases of the circulatory system: Secondary | ICD-10-CM

## 2012-05-03 DIAGNOSIS — Z833 Family history of diabetes mellitus: Secondary | ICD-10-CM

## 2012-05-03 DIAGNOSIS — N289 Disorder of kidney and ureter, unspecified: Secondary | ICD-10-CM | POA: Diagnosis present

## 2012-05-03 DIAGNOSIS — M545 Low back pain: Secondary | ICD-10-CM

## 2012-05-03 DIAGNOSIS — Z9181 History of falling: Secondary | ICD-10-CM

## 2012-05-03 DIAGNOSIS — D6481 Anemia due to antineoplastic chemotherapy: Secondary | ICD-10-CM | POA: Diagnosis present

## 2012-05-03 DIAGNOSIS — M25559 Pain in unspecified hip: Secondary | ICD-10-CM

## 2012-05-03 DIAGNOSIS — M899 Disorder of bone, unspecified: Secondary | ICD-10-CM | POA: Diagnosis present

## 2012-05-03 DIAGNOSIS — F411 Generalized anxiety disorder: Secondary | ICD-10-CM | POA: Diagnosis present

## 2012-05-03 LAB — COMPREHENSIVE METABOLIC PANEL
ALT: 11 U/L (ref 0–35)
AST: 9 U/L (ref 0–37)
Albumin: 2.3 g/dL — ABNORMAL LOW (ref 3.5–5.2)
Calcium: 7.9 mg/dL — ABNORMAL LOW (ref 8.4–10.5)
Chloride: 101 mEq/L (ref 96–112)
Creatinine, Ser: 1.01 mg/dL (ref 0.50–1.10)
Sodium: 134 mEq/L — ABNORMAL LOW (ref 135–145)
Total Bilirubin: 0.5 mg/dL (ref 0.3–1.2)

## 2012-05-03 LAB — CARDIAC PANEL(CRET KIN+CKTOT+MB+TROPI): Relative Index: INVALID (ref 0.0–2.5)

## 2012-05-03 LAB — CBC
MCV: 88.4 fL (ref 78.0–100.0)
Platelets: 136 10*3/uL — ABNORMAL LOW (ref 150–400)
RBC: 2.68 MIL/uL — ABNORMAL LOW (ref 3.87–5.11)
RDW: 14.7 % (ref 11.5–15.5)
WBC: 4.6 10*3/uL (ref 4.0–10.5)

## 2012-05-03 LAB — POCT I-STAT TROPONIN I: Troponin i, poc: 0.02 ng/mL (ref 0.00–0.08)

## 2012-05-03 LAB — PROTIME-INR: INR: 1.38 (ref 0.00–1.49)

## 2012-05-03 LAB — APTT: aPTT: 40 seconds — ABNORMAL HIGH (ref 24–37)

## 2012-05-03 MED ORDER — SULFAMETHOXAZOLE-TRIMETHOPRIM 800-160 MG PO TABS: 1.0000 | ORAL_TABLET | ORAL | Status: DC

## 2012-05-03 MED ORDER — OXYCODONE-ACETAMINOPHEN 7.5-325 MG PO TABS: 1.0000 | ORAL_TABLET | ORAL | Status: DC | PRN

## 2012-05-03 MED ORDER — POLYETHYLENE GLYCOL 3350 17 GM/SCOOP PO POWD
17.0000 g | Freq: Two times a day (BID) | ORAL | Status: DC
  Filled 2012-05-03: qty 255

## 2012-05-03 MED ORDER — ONDANSETRON HCL 4 MG PO TABS: 4.0000 mg | ORAL_TABLET | Freq: Three times a day (TID) | ORAL | Status: DC | PRN

## 2012-05-03 MED ORDER — DILTIAZEM HCL 100 MG IV SOLR
5.0000 mg/h | Freq: Once | INTRAVENOUS | Status: AC
  Administered 2012-05-03: 5 mg/h via INTRAVENOUS

## 2012-05-03 MED ORDER — SODIUM CHLORIDE 0.9 % IV SOLN: 1000.0000 mL | INTRAVENOUS | Status: DC

## 2012-05-03 MED ORDER — SODIUM CHLORIDE 0.9 % IJ SOLN
3.0000 mL | Freq: Two times a day (BID) | INTRAMUSCULAR | Status: DC
  Administered 2012-05-04: 3 mL via INTRAVENOUS

## 2012-05-03 MED ORDER — SULFAMETHOXAZOLE-TMP DS 800-160 MG PO TABS
1.0000 | ORAL_TABLET | ORAL | Status: DC
  Administered 2012-05-04: 1 via ORAL
  Filled 2012-05-03: qty 1

## 2012-05-03 MED ORDER — OXYCODONE-ACETAMINOPHEN 5-325 MG PO TABS: 1.5000 | ORAL_TABLET | ORAL | Status: DC | PRN

## 2012-05-03 MED ORDER — ATORVASTATIN CALCIUM 20 MG PO TABS
20.0000 mg | ORAL_TABLET | Freq: Every day | ORAL | Status: DC
  Administered 2012-05-03: 20 mg via ORAL
  Filled 2012-05-03 (×3): qty 1

## 2012-05-03 MED ORDER — SODIUM CHLORIDE 0.9 % IV SOLN
INTRAVENOUS | Status: DC
  Administered 2012-05-03: 17:00:00 via INTRAVENOUS
  Administered 2012-05-04: 50 mL/h via INTRAVENOUS

## 2012-05-03 MED ORDER — ALPRAZOLAM 0.5 MG PO TABS: 1.0000 mg | ORAL_TABLET | Freq: Every evening | ORAL | Status: DC | PRN

## 2012-05-03 MED ORDER — ACYCLOVIR 400 MG PO TABS
400.0000 mg | ORAL_TABLET | Freq: Every day | ORAL | Status: DC
  Administered 2012-05-04: 400 mg via ORAL
  Filled 2012-05-03: qty 1

## 2012-05-03 MED ORDER — CYCLOBENZAPRINE HCL 10 MG PO TABS: 5.0000 mg | ORAL_TABLET | Freq: Three times a day (TID) | ORAL | Status: DC | PRN

## 2012-05-03 MED ORDER — TRAMADOL HCL 50 MG PO TABS: 25.0000 mg | ORAL_TABLET | Freq: Every evening | ORAL | Status: DC | PRN

## 2012-05-03 MED ORDER — ENOXAPARIN SODIUM 80 MG/0.8ML ~~LOC~~ SOLN
1.0000 mg/kg | Freq: Two times a day (BID) | SUBCUTANEOUS | Status: DC
  Administered 2012-05-04: 75 mg via SUBCUTANEOUS
  Filled 2012-05-03 (×6): qty 0.8

## 2012-05-03 MED ORDER — FENTANYL 25 MCG/HR TD PT72
25.0000 ug | MEDICATED_PATCH | TRANSDERMAL | Status: DC
  Administered 2012-05-03: 25 ug via TRANSDERMAL
  Filled 2012-05-03: qty 1

## 2012-05-03 MED ORDER — QUETIAPINE FUMARATE 100 MG PO TABS
100.0000 mg | ORAL_TABLET | Freq: Every day | ORAL | Status: DC
  Administered 2012-05-03: 100 mg via ORAL
  Filled 2012-05-03 (×4): qty 1

## 2012-05-03 MED ORDER — PROCHLORPERAZINE MALEATE 10 MG PO TABS
10.0000 mg | ORAL_TABLET | Freq: Four times a day (QID) | ORAL | Status: DC | PRN
  Filled 2012-05-03: qty 1

## 2012-05-03 MED ORDER — LENALIDOMIDE 15 MG PO CAPS
15.0000 mg | ORAL_CAPSULE | Freq: Every day | ORAL | Status: DC
  Administered 2012-05-03 – 2012-05-04 (×2): 15 mg via ORAL
  Filled 2012-05-03 (×2): qty 1

## 2012-05-03 MED ORDER — SODIUM CHLORIDE 0.9 % IV BOLUS (SEPSIS)
500.0000 mL | Freq: Once | INTRAVENOUS | Status: AC
  Administered 2012-05-03: 500 mL via INTRAVENOUS

## 2012-05-03 MED ORDER — DILTIAZEM HCL ER COATED BEADS 180 MG PO CP24
180.0000 mg | ORAL_CAPSULE | Freq: Every day | ORAL | Status: DC
  Administered 2012-05-03 – 2012-05-04 (×2): 180 mg via ORAL
  Filled 2012-05-03 (×2): qty 1

## 2012-05-03 MED ORDER — LIDOCAINE 5 % EX PTCH
2.0000 | MEDICATED_PATCH | CUTANEOUS | Status: DC
  Administered 2012-05-03: 2 via TRANSDERMAL
  Filled 2012-05-03 (×3): qty 2

## 2012-05-03 MED ORDER — DILTIAZEM HCL 100 MG IV SOLR
5.0000 mg/h | Freq: Once | INTRAVENOUS | Status: AC
  Administered 2012-05-03: 5 mg/h via INTRAVENOUS
  Filled 2012-05-03: qty 100

## 2012-05-03 MED ORDER — POLYETHYLENE GLYCOL 3350 17 G PO PACK
17.0000 g | PACK | Freq: Two times a day (BID) | ORAL | Status: DC
  Administered 2012-05-04: 17 g via ORAL
  Filled 2012-05-03 (×3): qty 1

## 2012-05-03 NOTE — H&P (Signed)
History and Physical Note Family Medicine Teaching Service [name] M. Kendra Mikrut, MD Service Pager: [phone]   [name] is an 76 y.o. female.   Chief Complaint: Fall; new onset atrial fibrillation  HPI: Patient is a 76 yo F with PMH of HTN, Multiple Myeloma (currently undergoing treatment), depression, HLD and renal insufficieny who fell this morning around 9:30am. Patient states she stood up to go to restroom from her bed. She had her hands on walker but her legs gave out (due to weakness) and she fell to the ground. Patient and family report that she did not hit head, did not have LOC. Patient states she was awake and remembers entire event. Did not hurt herself other than an abrasion on her knee. Patient had been up earlier this morning and also felt weak then but did not fall. She states she has had increased weakness for about one week. She states she has been feeling her heart racing, and noticed it more this morning. She reports some "flutters" in her chest this morning and some heaviness but no chest pain, pain in neck, or pain in arms. No sweating. No shortness of breath. Some nausea, but no vomiting. No headaches today but reports headache yesterday. No numbness or tingling. Patient does report some burning with urination and vaginal itching. Overall, patient states she feels well.   Of note, patient had a previous hospitalization from July 7-July 25. Admitted to [facility] then had a fall on July 9 and transferred to [facility] from [place]. Told she had multiple myeloma during that admission and started on treatment. She has also met with Rad Onc for Palliative Radiation, which does not appear to have started yet.   ED course: Patient had atrial fibrillation on monitor at arrival to ED with HR to 136. Started on Cardiazem drip. CXR unremarkable. HR decreased to 110's. Family Practice called for admission.  Past Medical History  Diagnosis Date  . Anxiety   . Hypertension   .  Hyperlipidemia   . Depression   . Osteopenia   . Multiple myeloma 03/2012  . Acute renal failure      due to hypercalcemia and suspected multiple myeloma resolved =with IVF's    . Lytic bone lesions on xray   . Acute subdural hematoma 03/2012     recent fall in hospital stay,resolved+  . Normocytic anemia     due to chemo and multiple myeloma,no active bleding  . Cancer     mul;tiple myeloma, bone lesions r 7th rib,   . Arthritis     spine    Past Surgical History  Procedure Date  . Breast lumpectomy   . 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 Tonsillectomy     age 3   Family History  Problem Relation Age of Onset  . Heart disease Mother   . Hyperlipidemia Sister   . Hypertension Sister   . Diabetes Sister   . Diabetes Sister   . Hyperlipidemia Sister   . Hypertension Sister   . Sudden death Neg Hx   . Heart attack Neg Hx    Social History:  reports that she has never smoked. She has never used smokeless tobacco. She reports that she does not drink alcohol or use illicit drugs. Lives in a senior citizen apartment in [place], [place] with her husband. Does not currently have home health but does have speech therapy.  Allergies:  Allergies  Allergen Reactions  . Simvastatin Other (See Comments)    Unsure, Joint aches listed previously    Prior to Admission medications   Medication Sig Start Date End Date Taking? Authorizing Provider  acyclovir (ZOVIRAX) 400 MG tablet Take 400 mg by mouth daily.   Yes Historical Provider, MD  ALPRAZolam Prudy Feeler) 1 MG tablet Take 1 mg by mouth at bedtime as needed. Sleep.   Yes Historical Provider, MD  cyclobenzaprine (FLEXERIL) 5 MG tablet Take 5 mg by mouth. Take 1 tablet every 8 hours 03/21/12  Yes Historical Provider, MD  dexamethasone (DECADRON) 4 MG tablet Take 40 mg by mouth every Tuesday. with chemotherapy drug 04/20/12  Yes Evlyn Kanner Love,  PA  enoxaparin (LOVENOX) 40 MG/0.4ML injection Inject 0.4 mLs (40 mg total) into the skin daily. 04/26/12  Yes Exie Parody, MD  fentaNYL (DURAGESIC - DOSED MCG/HR) 12 MCG/HR Place 2 patches (25 mcg total) onto the skin every 3 (three) days. 04/21/12 05/21/12 Yes Evlyn Kanner Love, PA  fentaNYL (DURAGESIC - DOSED MCG/HR) 25 MCG/HR Place 1 patch (25 mcg total) onto the skin every 3 (three) days. 04/21/12 05/21/12 Yes Evlyn Kanner Love, PA  HYDROcodone-acetaminophen (VICODIN) 5-500 MG per tablet Take 1 tablet by mouth every 6 (six) hours as needed for pain. 04/26/12 05/06/12 Yes Exie Parody, MD  lidocaine (LIDODERM) 5 % Place 2 patches onto the skin daily. Remove & Discard patch within 12 hours or as directed by MD 04/20/12 05/20/12 Yes Evlyn Kanner Love, PA  ondansetron (ZOFRAN) 4 MG tablet Take 4 mg by mouth. Take 1 tablet every 8 hours as needed for nausea 04/21/12  Yes Historical Provider, MD  oxyCODONE (OXY IR/ROXICODONE) 5 MG immediate release tablet Take 5-10 mg by mouth every 4 (four) hours as needed. pain   Yes Historical Provider, MD  oxyCODONE-acetaminophen (PERCOCET) 7.5-325 MG per tablet Take by mouth. Take 1 tablet every 4-6 hours as needed.  7.5-325mg  03/29/12  Yes Historical Provider, MD  polyethylene glycol powder (GLYCOLAX/MIRALAX) powder Take 17 g by mouth 2 (two) times daily. Take as directed 04/20/12  Yes Historical Provider, MD  prochlorperazine (COMPAZINE) 10 MG tablet Take 10 mg by mouth. Take 1 tablet every 6 hours as directed 04/12/12  Yes Historical Provider, MD  QUEtiapine (SEROQUEL) 100 MG tablet Take 100 mg by mouth. Take 1 tablet daily 03/25/12  Yes Historical Provider, MD  rosuvastatin (CRESTOR) 10 MG tablet Take 10 mg by mouth at bedtime.    Yes Historical Provider, MD  sulfamethoxazole-trimethoprim (BACTRIM DS,SEPTRA DS) 800-160 MG per tablet Take 1 tablet by mouth every Monday, Wednesday, and Friday. 04/26/12 05/06/12 Yes Exie Parody, MD  traMADol (ULTRAM) 50 MG tablet Take 25 mg by mouth at bedtime as  needed. pain 03/19/12  Yes Historical Provider, MD   Results for orders placed during the hospital encounter of 05/03/12 (from the past 48 hour(s))  CBC     Status: Abnormal   Collection Time   05/03/12 11:13 AM      Component Value Range Comment   WBC 4.6  4.0 - 10.5 K/uL    RBC 2.68 (*) 3.87 - 5.11 MIL/uL    Hemoglobin 8.1 (*) 12.0 - 15.0 g/dL    HCT 16.1 (*) 09.6 - 46.0 %    MCV 88.4  78.0 - 100.0 fL    MCH 30.2  26.0 - 34.0 pg    MCHC 34.2  30.0 - 36.0 g/dL    RDW 04.5  40.9 - 81.1 %  Platelets 136 (*) 150 - 400 K/uL   COMPREHENSIVE METABOLIC PANEL     Status: Abnormal   Collection Time   05/03/12 11:13 AM      Component Value Range Comment   Sodium 134 (*) 135 - 145 mEq/L    Potassium 3.7  3.5 - 5.1 mEq/L    Chloride 101  96 - 112 mEq/L    CO2 19  19 - 32 mEq/L    Glucose, Bld 104 (*) 70 - 99 mg/dL    BUN 17  6 - 23 mg/dL    Creatinine, Ser 1.61  0.50 - 1.10 mg/dL    Calcium 7.9 (*) 8.4 - 10.5 mg/dL    Total Protein 6.6  6.0 - 8.3 g/dL    Albumin 2.3 (*) 3.5 - 5.2 g/dL    AST 9  0 - 37 U/L    ALT 11  0 - 35 U/L    Alkaline Phosphatase 78  39 - 117 U/L    Total Bilirubin 0.5  0.3 - 1.2 mg/dL    GFR calc non Af Amer 53 (*) >90 mL/min    GFR calc Af Amer 62 (*) >90 mL/min   PROTIME-INR     Status: Abnormal   Collection Time   05/03/12 11:13 AM      Component Value Range Comment   Prothrombin Time 17.2 (*) 11.6 - 15.2 seconds    INR 1.38  0.00 - 1.49   APTT     Status: Abnormal   Collection Time   05/03/12 11:13 AM      Component Value Range Comment   aPTT 40 (*) 24 - 37 seconds   PRO B NATRIURETIC PEPTIDE     Status: Abnormal   Collection Time   05/03/12 11:14 AM      Component Value Range Comment   Pro B Natriuretic peptide (BNP) 853.3 (*) 0 - 125 pg/mL   POCT I-STAT TROPONIN I     Status: Normal   Collection Time   05/03/12 11:30 AM      Component Value Range Comment   Troponin i, poc 0.02  0.00 - 0.08 ng/mL    Comment 3             Dg Chest Portable 1  View  05/03/2012  *RADIOLOGY REPORT*  Clinical Data: Fall, tachycardia with palpitations  PORTABLE CHEST - 1 VIEW  Comparison: CT thorax 04/06/2012  Findings: Mild enlarged heart silhouette.  There is no effusion, infiltrate, or pneumothorax.  Mild central venous congestion is present.  Right chest wall mass is decreased in size from comparison CT.  IMPRESSION:  1.  No acute cardiopulmonary findings. 2.  Decrease in volume of right chest wall mass.  Original Report Authenticated By: Genevive Bi, M.D.   ROS Please see HPI above  Blood pressure 86/46, pulse 108, temperature 97.7 F (36.5 C), temperature source Oral, resp. rate 22, SpO2 99.00%. Physical Exam  Constitutional: She appears well-developed and well-nourished. No distress.       Lying in bed, awake, alert. Answers questions appropriately. Very pleasant.  HENT:  Head: Normocephalic and atraumatic.  Mouth/Throat: Oropharynx is clear and moist. No oropharyngeal exudate.       Hemangioma of lower lip  Neck: Normal range of motion.  Cardiovascular: Intact distal pulses.  An irregularly irregular rhythm present. Tachycardia present.   Respiratory: Effort normal and breath sounds normal. No respiratory distress. She has no wheezes.       No crackles appreciated. Auscultated  anterior and laterally.  GI: Soft. There is no tenderness.  Musculoskeletal:       Trace edema bilaterally. 2 cm abrasion of skin of right knee, non-bloody. TTP of medial aspect of left knee without obvious bruising or swelling. Difficulty going from lying to sitting position secondary to back pain.  Lymphadenopathy:    She has no cervical adenopathy.  Neurological: She is alert. No cranial nerve deficit.  Skin: Skin is warm and dry. There is pallor.  Psychiatric: She has a normal mood and affect.    Assessment/Plan 76 yo F with multiple myeloma, HTN and renal insufficieny presenting with acute onset atrial fibrillation   # New onset Atrial Fibrillation- Unsure  of inciting event. Troponin negative in ED. No signs of fluid overload to make [name] think of new CHF (Pro BNP 853, but we do not have prior value to compare.) TSH within last month was within normal limits. Patient is currently being treated for multiple myeloma as well with recent chemotherapy. - Admit to telemetry. Attending Dr. Sheffield Slider. - Continue Cardiazem drip at 5-56mcg for now for heart rate control. Goal would be less than 110. HR currently in 110's, but borderline hypotensive to 110's/50's. Continue to monitor vital signs closely for hypotension and/or bradycardia. - Will consult cardiology for further recommendations -  Echocardiogram ordered. Will await cards recs for TEE and/or cardioversion if they feel that is appropriate - Currently on Lovenox ppx; will switch to Lovenox treatment dose for anticoagulation - Recheck CBC, Cmet and EKG in the AM  # Multiple myeloma- Currently undergoing treatment, with a scheduled treatment tomorrow. - Will contact Heme-Onc to make them aware of her current admission - Continue all home medications - Will closely monitor for pain control. Patient currently on Fentanyl patch, Oxycodone (Percocet), and Ultram. - On Lovenox as an outpatient, changing to treatment dose - Anemic at baseline. Current HgB 8.1 which is not at transfusion threshold. Closely monitor.  # Mental health- Patient has history of anxiety and depression. Appears stable at this time - Continue Seroquel 100mg  whs - Continue Xanax qhs prn  # Renal insufficiency- Noted at previous admission likely secondary to electrolyte abnormalities from multiple myeloma. Creatinine stable at admission. - Continue to monitor with daily labs  # FEN/GI- Will keep NPO for now in case patient needs TEE or cardioversion. Miralax prn for constipation. NS at 50cc/hr for now. If NPO long period of time will consider changing to D5NS.  # PPx- Increasing Lovenox to treatment dose in setting of new onset atrial  fibrillation (per pharmacy consult)  # Dispo- Pending further work up and clinical improvement. Plans to be returning home with husband.  Code: FULL PCP: Dr. Linford Arnold, [facility] [name], [name] 05/03/2012, 1:15 PM

## 2012-05-03 NOTE — H&P (Signed)
I interviewed and examined this patient and discussed the care plan with Dr. Mikel Cella and the [facility] team and agree with assessment and plan as documented in the admission note for today. At the time of my examination, the monitor showed NSR with a normal rate. Her pain has not been well controlled on the 2 Fentanyl patches daily, and the back pain was severe this morning. We should increase the Fentanyl to 50 mcg dose at next patch change. She is weak and hasn't been exercising much due to fear of falling. Consider Vitamin D level.     [name] A. Sheffield Slider, MD Family Medicine Teaching Service Attending  05/03/2012 4:29 PM

## 2012-05-03 NOTE — ED Notes (Signed)
Pt in via EMS- pt in after at fall per EMS, after assisting patient up they attached patient to monitor and noted pt in A. Fib RVR with rates ranging from 130-220 bmp. Initial BP 108/60. IV initiated PTA and patient given fluid bolus, HR decreased to 130-170 bpm range. Pt c/o fluttering in chest. Pt given 5mg  cardizem by EMS. After cardizem pt HR 80-120 bpm, BP decreased to 84 systolic. Pt given total of IV bolus PTA. CBG 147. Pt has no history of A. Fib.

## 2012-05-03 NOTE — Progress Notes (Signed)
Patient ID: [name], female   DOB: 1936/07/26, 76 y.o.   MRN: [id number]   CARDIOLOGY CONSULT NOTE  Patient ID: [name] MRN: [id number], DOB/AGE: 03-29-1936   Admit date: 05/03/2012 Date of Consult: 05/03/2012   Primary Physician: [name], MD Primary Cardiologist: none  Pt. Profile  [name]  Is 76 year old married white female who came in today from weakness in her legs and a subsequent fall. She complained of intermittent palpitations over the last 3 weeks and was found to be in A. fib in the 130s on EKG. Cardiology was asked to evaluate.   Problem List  Past Medical History  Diagnosis Date  . Anxiety   . Hypertension   . Hyperlipidemia   . Depression   . Osteopenia   . Multiple myeloma 03/2012  . Acute renal failure      due to hypercalcemia and suspected multiple myeloma resolved =with IVF's    . Lytic bone lesions on xray   . Acute subdural hematoma 03/2012     recent fall in hospital stay,resolved+  . Normocytic anemia     due to chemo and multiple myeloma,no active bleding  . Cancer     mul;tiple myeloma, bone lesions r 7th rib,   . Arthritis     spine    Past Surgical History  Procedure Date  . Breast lumpectomy   . 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 Tonsillectomy     age 33     Allergies  Allergies  Allergen Reactions  . Simvastatin Other (See Comments)    Unsure, Joint aches listed previously     HPI   Other than a history of hypertension, she has no cardiac history. She has recently been diagnosed with multiple myeloma and has been a lot of back pain. She's had 3 rounds of chemotherapy and is soon to start palliative radiation therapy to her spine.  She is anemic with a hemoglobin of 8.7.  Has been having intermittent palpitations that are brief in nature for the last few weeks. She denies any chest pain with these.  She's  had several falls and uses a walker. She would not be a good anticoagulation candidate. Fortunately, she did not hit her head today.  He was treated with intravenous diltiazem and before she arrived to the floor he converted to sinus rhythm. Her last EKG showed normal sinus rhythm as well.   Inpatient Medications     . acyclovir  400 mg Oral Daily  . atorvastatin  20 mg Oral q1800  . diltiazem  180 mg Oral Daily  . diltiazem (CARDIZEM) infusion  5-15 mg/hr Intravenous Once  . diltiazem (CARDIZEM) infusion  5-15 mg/hr Intravenous Once  . enoxaparin (LOVENOX) injection  1 mg/kg Subcutaneous Q12H  . fentaNYL  25 mcg Transdermal Q72H  . lenalidomide  15 mg Oral Daily  . lidocaine  2 patch Transdermal Q24H  . polyethylene glycol  17 g Oral BID  . QUEtiapine  100 mg Oral QHS  . sodium chloride  500 mL Intravenous Once  . sodium chloride  3 mL Intravenous Q12H  . sulfamethoxazole-trimethoprim  1 tablet Oral Q M,W,F  . DISCONTD: polyethylene glycol powder  17 g Oral BID  . DISCONTD: sulfamethoxazole-trimethoprim  1 tablet Oral Q M,W,F    Family History Family History  Problem Relation Age of Onset  . Heart disease Mother   . Hyperlipidemia Sister   .  Hypertension Sister   . Diabetes Sister   . Diabetes Sister   . Hyperlipidemia Sister   . Hypertension Sister   . Sudden death Neg Hx   . Heart attack Neg Hx      Social History History   Social History  . Marital Status: Married    Spouse Name: N/A    Number of Children: 1  . Years of Education: N/A   Occupational History  .      [employer]   Social History Main Topics  . Smoking status: Never Smoker   . Smokeless tobacco: Never Used  . Alcohol Use: No  . Drug Use: No  . Sexually Active: Not on file     menses age 63, pregnancy age 30, p!G!   Other Topics Concern  . Not on file   Social History Narrative   Married, 1 son, retired from [employer], lives in [place]     Review of Systems  General:  No chills, fever,  night sweats or weight changes.  has been profoundly weak  Cardiovascular:  nothing other than palpitations  Dermatological: No rash, lesions/masses Respiratory: No cough, dyspnea Urologic: No hematuria, dysuria Abdominal:   No nausea, vomiting, diarrhea, bright red blood per rectum, melena, or hematemesis Neurologic:  No visual changes, wkns, changes in mental status. All other systems reviewed and are otherwise negative except as noted above.  Physical Exam  Blood pressure 114/63, pulse 80, temperature 98.7 F (37.1 C), temperature source Oral, resp. rate 20, height 5\' 7"  (1.702 m), weight 165 lb 5.5 oz (75 kg), SpO2 98.00%.  General: Pleasant, NAD, pale  Psych: Normal affect. anxious  Neuro: Alert and oriented X 3. Moves all extremities spontaneously. HEENT: Normal  Neck: Supple without bruits or JVD. Lungs:  Resp regular and unlabored, CTA. Heart: RRR no s3, s4, or murmurs. Abdomen: Soft, non-tender, non-distended, BS + x 4.  Extremities: No clubbing, cyanosis or edema. DP/PT/Radials 2+ and equal bilaterally.  Labs   [facility] 05/03/12 1606  CKTOTAL 20  CKMB 2.7  TROPONINI <0.30   Lab Results  Component Value Date   WBC 4.6 05/03/2012   HGB 8.1* 05/03/2012   HCT 23.7* 05/03/2012   MCV 88.4 05/03/2012   PLT 136* 05/03/2012    Lab 05/03/12 1113  NA 134*  K 3.7  CL 101  CO2 19  BUN 17  CREATININE 1.01  CALCIUM 7.9*  PROT 6.6  BILITOT 0.5  ALKPHOS 78  ALT 11  AST 9  GLUCOSE 104*   Lab Results  Component Value Date   CHOL 255* 03/03/2012   HDL 62 03/03/2012   LDLCALC 045* 03/03/2012   TRIG 117 03/03/2012   No results found for this basename: DDIMER    Radiology/Studies  Dg Orthopantogram  04/04/2012  *RADIOLOGY REPORT*  Clinical Data: 76 year old female.  Dental Pathology.  ORTHOPANTOGRAM/PANORAMIC  Comparison: None.  Findings: Residual dentition (incisors, canine, and left maxillary molar) demonstrate filling with no definite dental caries.  No peri apical lucency.  The  remaining dentition is absent.  IMPRESSION: Dental fillings in the residual dentition.  No definite dental caries or peri apical lucency.  Original Report Authenticated By: Harley Hallmark, M.D.   X-ray Chest Pa And Lateral   04/04/2012  *RADIOLOGY REPORT*  Clinical Data: 76 year old female with cough shortness of breath chest pain.  CHEST - 2 VIEW  Comparison: 04/11/2007.  Lumbar MRI from 04/04/2012.  Findings: Upright AP and lateral views of the chest.  There is a peripheral  right lung 6 cm mass-like opacity.  This may be pleural based or might be extrapleural.  Mediastinal and hilar contours appear stable and within normal limits. The anterior ribs in the region of the mass are indistinct.  The left lung appears stable and clear. Visualized tracheal air column is within normal limits.  No definite other osseous lesion identified.  IMPRESSION: 1.  Mass-like opacity in the right hemithorax, may be pleural based or could be extrapleural (indeterminate adjacent rib involvement). In light of the lumbar findings earlier today, top differential considerations are multiple myeloma versus bronchogenic carcinoma of the right chest.  Chest CT (IV contrast preferred) would characterize further. 2.  No other cardiopulmonary abnormality identified.  Original Report Authenticated By: Harley Hallmark, M.D.   Dg Hip Bilateral W/pelvis  04/04/2012  *RADIOLOGY REPORT*  Clinical Data: 76 year old female with pain.  Abnormal lumbar MRI.  BILATERAL HIP WITH PELVIS - 4+ VIEW  Comparison: Lumbar MRI from the same day reported separately. Lumbar radiographs 03/19/2012.  Findings: A subtle but asymmetric decreased bone mineralization of the proximal right femur.  A small (3-4 mm) and lucent lesions are noted in both intertrochanteric regions.  No proximal femoral pathologic fracture is identified.  There may be a small lucent lesions of the left iliac crest versus overlying bowel gas.  No pelvic fracture is identified.  Bone  mineralization in the pelvis is similar to that seen in the visible lumbar spine.  IMPRESSION: Decreased bone mineralization in the pelvis and proximal femurs similar to that in the lumbar spine with possible superimposed small lucent lesions, but no pathologic fracture identified. Appearance compatible with widespread multiple myeloma (favored) or bone metastases.  Original Report Authenticated By: Harley Hallmark, M.D.   Ct Head Wo Contrast  04/07/2012  *RADIOLOGY REPORT*  Clinical Data: Follow-up acute subdural hematoma.  CT HEAD WITHOUT CONTRAST  Technique:  Contiguous axial images were obtained from the base of the skull through the vertex without contrast.  Comparison: Prior scans from 04/06/2012.  Findings: Considerable motion degradation.  Overall study marginally diagnostic.  Slight improvement acute right subdural hematoma.  Thickness as measured at the level of the corpus callosum over the right frontotemporal convexity image 17 is 7.4 mm as compared with 10 - 11 mm previously.  Slight midline shift of 3-4 mm also appears improved.  Aside from midline shift, the brain is grossly within normal limits although motion significantly limits detection.  IMPRESSION: Slight improvement acute right subdural.  As measured on image 17, the hematoma thickness in the right frontotemporal region is 7 mm, decreased from 10-11 mm.  Original Report Authenticated By: Elsie Stain, M.D.   Ct Head Wo Contrast  04/06/2012  *RADIOLOGY REPORT*  Clinical Data: Follow-up subdural hematoma.  CT HEAD WITHOUT CONTRAST  Technique:  Contiguous axial images were obtained from the base of the skull through the vertex without contrast.  Comparison: CT head earlier in the day.  Findings: Predominantly acute right subdural hematoma overlying the frontal and parietal lobes extending to the convexity is redemonstrated.  Thickness as measured on image 22 is 12 mm in  the right posterior frontal region, unchanged from the scan 6 hours  earlier. Midline shift as measured at the level of the septum pellucidum (image 15) is 7 mm right-to-left, also similar to priors.  There is mild atrophy with chronic microvascular ischemic change. Right-sided scalp hematoma redemonstrated.  Multiple punched out lesions in the  calvarium are indeterminate with differential as previously discussed for myeloma  versus possible metastatic disease.  IMPRESSION: Stable predominately acute right subdural hematoma.  12 mm thick as measured on image 22 right posterior frontal region.  7 mm right-to- left midline shift.  Original Report Authenticated By: Elsie Stain, M.D.   Ct Head Wo Contrast  04/06/2012  *RADIOLOGY REPORT*  Clinical Data:  Status post fall; hit posterior head.  Concern for head or cervical spine injury.  CT HEAD WITHOUT CONTRAST AND CT CERVICAL SPINE WITHOUT CONTRAST  Technique:  Multidetector CT imaging of the head and cervical spine was performed following the standard protocol without intravenous contrast.  Multiplanar CT image reconstructions of the cervical spine were also generated.  Comparison: CT of the head performed 01/06/2005  CT HEAD  Findings:  There is a predominantly acute subdural hematoma overlying the right frontal parietal lobes, extending to the convexity.  This measures approximately 1.1 cm at the dominant acute component along the right parietal lobe, and up to 1.6 cm at the convexity.  Associated mass effect is noted, with up to 9 mm of leftward midline shift. There is no evidence of hydrocephalus.  No additional hemorrhage is identified.  There is no evidence of mass lesion; no acute infarct is identified.  Mild subcortical and periventricular white matter change likely reflects small vessel ischemic microangiopathy.  Mild cerebellar atrophy is noted.  The brainstem and fourth ventricle are within normal limits.  The basal ganglia are unremarkable in appearance.  There is no evidence of fracture; scattered small lytic foci  within the upper calvarium may reflect multiple myeloma or less likely metastatic disease.  The orbits are within normal limits.  The paranasal sinuses and mastoid air cells are well-aerated.  Minimal soft tissue swelling is noted on the right side near the vertex.  IMPRESSION:  1.  Predominantly acute subdural hematoma overlying the right frontal and parietal lobes, extending to the convexity.  This measures 1.1 cm at the dominant acute component along the right parietal lobe, and up to 1.6 cm at the convexity.  Associated mass effect, with up to 9 mm of leftward midline shift. 2.  Minimal soft tissue swelling noted on the right side near the vertex. 3.  Scattered small lytic foci within the upper calvarium may reflect multiple myeloma or less likely metastatic disease. 4.  Mild small vessel ischemic microangiopathy.  CT CERVICAL SPINE  Findings: There is no evidence of acute fracture or subluxation. There is minimal grade 1 anterolisthesis of C4 on C5, and mild narrowing of the intervertebral disc spaces at C5-C6 and C6-C7, with small associated anterior and posterior disc osteophyte complexes.  Vertebral bodies otherwise demonstrate normal height and alignment. Prevertebral soft tissues are within normal limits.  The thyroid gland is unremarkable in appearance.  The visualized lung apices are clear.  Calcification is noted at the carotid bifurcations bilaterally.  IMPRESSION:  1.  No evidence of acute fracture or subluxation along the cervical spine. 2.  Mild degenerative change noted along the lower cervical spine. 3.  Calcification noted at the carotid bifurcations bilaterally. Carotid ultrasound could be considered for further evaluation on an elective non-emergent basis, when and as deemed clinically appropriate.  Critical Value/emergent results were called by telephone at the time of interpretation on 04/06/2012  at 06:41 a.m.  to  Tama Gander NP, who verbally acknowledged these results.  Original  Report Authenticated By: Tonia Ghent, M.D.   Ct Chest Wo Contrast  04/06/2012  *RADIOLOGY REPORT*  Clinical Data: Right lung mass.  CT CHEST WITHOUT  CONTRAST  Technique:  Multidetector CT imaging of the chest was performed following the standard protocol without IV contrast.  Comparison: Chest x-ray dated 04/04/2012  Findings: There is a 4.4 x 5.7 x 5.2 cm mass destroying the lateral aspect of the right seventh rib.  It extends into the right hemithorax and compresses the adjacent lung.   There is no hilar or mediastinal adenopathy.  Minimal atelectasis at the left lung base.  Tiny right effusion.  There are multiple subtle tiny lesions in the thoracic spine consistent with the lesions seen on the lumbar MRI dated 04/04/2012.  The heart is at the upper limits of normal in size.  There is a tiny hiatal hernia.  Visualized portion of the upper abdomen is normal.  IMPRESSION:  1.  Numerous tiny lesions throughout the thoracic spine consistent with multiple myeloma. 2.  Large soft tissue mass destroying the lateral aspect of the right seventh rib, most likely a large lesion of multiple myeloma (plasmocytoma).  Original Report Authenticated By: Gwynn Burly, M.D.   Ct Cervical Spine Wo Contrast  04/06/2012  *RADIOLOGY REPORT*  Clinical Data:  Status post fall; hit posterior head.  Concern for head or cervical spine injury.  CT HEAD WITHOUT CONTRAST AND CT CERVICAL SPINE WITHOUT CONTRAST  Technique:  Multidetector CT imaging of the head and cervical spine was performed following the standard protocol without intravenous contrast.  Multiplanar CT image reconstructions of the cervical spine were also generated.  Comparison: CT of the head performed 01/06/2005  CT HEAD  Findings:  There is a predominantly acute subdural hematoma overlying the right frontal parietal lobes, extending to the convexity.  This measures approximately 1.1 cm at the dominant acute component along the right parietal lobe, and up to 1.6 cm  at the convexity.  Associated mass effect is noted, with up to 9 mm of leftward midline shift. There is no evidence of hydrocephalus.  No additional hemorrhage is identified.  There is no evidence of mass lesion; no acute infarct is identified.  Mild subcortical and periventricular white matter change likely reflects small vessel ischemic microangiopathy.  Mild cerebellar atrophy is noted.  The brainstem and fourth ventricle are within normal limits.  The basal ganglia are unremarkable in appearance.  There is no evidence of fracture; scattered small lytic foci within the upper calvarium may reflect multiple myeloma or less likely metastatic disease.  The orbits are within normal limits.  The paranasal sinuses and mastoid air cells are well-aerated.  Minimal soft tissue swelling is noted on the right side near the vertex.  IMPRESSION:  1.  Predominantly acute subdural hematoma overlying the right frontal and parietal lobes, extending to the convexity.  This measures 1.1 cm at the dominant acute component along the right parietal lobe, and up to 1.6 cm at the convexity.  Associated mass effect, with up to 9 mm of leftward midline shift. 2.  Minimal soft tissue swelling noted on the right side near the vertex. 3.  Scattered small lytic foci within the upper calvarium may reflect multiple myeloma or less likely metastatic disease. 4.  Mild small vessel ischemic microangiopathy.  CT CERVICAL SPINE  Findings: There is no evidence of acute fracture or subluxation. There is minimal grade 1 anterolisthesis of C4 on C5, and mild narrowing of the intervertebral disc spaces at C5-C6 and C6-C7, with small associated anterior and posterior disc osteophyte complexes.  Vertebral bodies otherwise demonstrate normal height and alignment. Prevertebral soft tissues are within normal limits.  The thyroid gland is unremarkable in appearance.  The visualized lung apices are clear.  Calcification is noted at the carotid bifurcations  bilaterally.  IMPRESSION:  1.  No evidence of acute fracture or subluxation along the cervical spine. 2.  Mild degenerative change noted along the lower cervical spine. 3.  Calcification noted at the carotid bifurcations bilaterally. Carotid ultrasound could be considered for further evaluation on an elective non-emergent basis, when and as deemed clinically appropriate.  Critical Value/emergent results were called by telephone at the time of interpretation on 04/06/2012  at 06:41 a.m.  to  Tama Gander NP, who verbally acknowledged these results.  Original Report Authenticated By: Tonia Ghent, M.D.   Mr Lumbar Spine Wo Contrast  04/04/2012  *RADIOLOGY REPORT*  Clinical Data: Low back pain and lower extremity weakness.  MRI LUMBAR SPINE WITHOUT CONTRAST  Technique:  Multiplanar and multiecho pulse sequences of the lumbar spine were obtained without intravenous contrast.  Comparison: Radiographs dated 03/19/2012 and CT scan of the abdomen dated 10/05/2008  Findings: The scan extends from T11-12 through S3.  Tip of the conus is at L1 and appears normal.  There are innumerable lesions throughout the visualized bones. There is a pathologic Schmorl's node in the inferior aspect of L3. The appearance is most typical for multiple myeloma.  T11-12:  The disc is normal.  T12-L1:  There is a Schmorl's node in the inferior endplate of T12 which may be related to the numerous lesions.  No disc bulging.  L1-2:  Small annular disc tear asymmetric bulge of the left of midline with no neural impingement.  L2-3:  Broad-based disc bulge with a small protrusion into the right neural foramen.  Right L2 nerve exits without impingement. Slight narrowing of the spinal canal and right lateral recess without focal neural impingement.  L3-4: There is a 5 mm retrolisthesis of L3 on L4 with broad-based bulge of the disc narrowing the spinal canal slightly without focal neural impingement.  L4-5:  Slight disc space narrowing with a tiny  broad-based disc bulge with osteophyte formation and slight hypertrophy of the left facet joint and ligamentum flavum.  L5-S1:  The disc is normal.  Moderate right and mild left facet arthritis.  There are numerous lesions in the sacrum.  Incidental note is made of a 2.7 cm gallstone.  Gallbladder Horris Speros is not thickened.  IMPRESSION: 1.  There are innumerable lesions throughout the entire visualized portion of the skeleton, most likely multiple myeloma. Metastatic cancer can give the same appearance. 2.  Probable pathologic Schmorl's nodes in the inferior aspects of T12 and L3. 3.  No significant neural impingement in the lumbar spine.  Critical Value/emergent results were called by telephone at the time of interpretation on 04/04/2012  at 12:19 p.m.  to  Dr. Vassie Loll, who verbally acknowledged these results.  Original Report Authenticated By: Gwynn Burly, M.D.   US Renal  04/04/2012  *RADIOLOGY REPORT*  Clinical Data: Renal failure.  Hypertension.  RENAL/URINARY TRACT ULTRASOUND COMPLETE  Comparison:  CT of 10/05/2008  Findings:  Right Kidney:  10.6 cm. No hydronephrosis.  Normal renal cortical thickness and echogenicity.  Left Kidney:  11.1 cm. No hydronephrosis.  Normal renal cortical thickness and echogenicity.  9 mm cyst or minimally complex cyst in the lower pole left kidney.  Bladder:  Within normal limits.  Incidental note is made of a 2.5 cm gallstone, without Kimberli Winne thickening or pericholecystic fluid.  IMPRESSION:  1. No acute process or explanation for  renal failure. 2.  Cholelithiasis.  Original Report Authenticated By: Consuello Bossier, M.D.   Ct Biopsy  04/08/2012  *RADIOLOGY REPORT*  Clinical history: 76 year old with findings suspicious for myeloma.   PROCEDURE(S): CT GUIDED BONE MARROW ASPIRATES AND BIOPSY  Physician: Rachelle Hora. Lowella Dandy, MD   Medications: Versed 1.5 mg, Fentanyl 25 mcg. A radiology nurse monitored the patient for moderate sedation.  Sedation time:  18 minutes   Procedure: The  procedure was explained to the patient. The risks and benefits of the procedure were discussed and the patient's questions were addressed.  Informed consent was obtained from the patient. The patient was placed prone on CT scan. Images of the pelvis were obtained. The right side of back was prepped and draped in sterile fashion. The skin and right posterior iliac bone were anesthetized with 1% lidocaine.   11 gauge bone needle was directed into the right iliac bone with CT guidance. Two aspirates and two core biopsy obtained.   Findings: Needle placement within the right posterior iliac bone.  Complications: None   Impression: CT guided bone marrow aspirates and core biopsy.  Original Report Authenticated By: Richarda Overlie, M.D.   Dg Chest Portable 1 View  05/03/2012  *RADIOLOGY REPORT*  Clinical Data: Fall, tachycardia with palpitations  PORTABLE CHEST - 1 VIEW  Comparison: CT thorax 04/06/2012  Findings: Mild enlarged heart silhouette.  There is no effusion, infiltrate, or pneumothorax.  Mild central venous congestion is present.  Right chest Uriel Dowding mass is decreased in size from comparison CT.  IMPRESSION:  1.  No acute cardiopulmonary findings. 2.  Decrease in volume of right chest Tikita Mabee mass.  Original Report Authenticated By: Genevive Bi, M.D.    ECG  paroxysmal atrial fib  with a rapid ventricular rate  ASSESSMENT AND PLAN  This is a delightful 76 year old married white female who had a weak episode today and fell not really injuring herself. She is in a lot of pain from her multiple myeloma and also she is significantly anemic. She has a history of hypertension, all which may predispose her to a supraventricular arrhythmia such as atrial fib.  She is converted to normal sinus rhythm after getting IV diltiazem. Switch her to by mouth diltiazem 180 mg by mouth every morning, obtain echo as an outpatient with followup with [name]. I will range. She is not a candidate for anticoagulation my opinion having  high fall risk and being significantly anemic.  I had a talk with her husband and her. I have made it clear that she will have recurrent episodes of A. fib but the hope this is to decrease her frequency and the rate with the diltiazem.   Signed, Valera Castle, MD 05/03/2012, 7:03 PM

## 2012-05-03 NOTE — Progress Notes (Signed)
ANTICOAGULATION CONSULT NOTE - Initial Consult  Pharmacy Consult for Lovenox Indication: atrial fibrillation  Allergies  Allergen Reactions  . Simvastatin Other (See Comments)    Unsure, Joint aches listed previously     Patient Measurements: Height: 5\' 7"  (170.2 cm) Weight: 165 lb 5.5 oz (75 kg) IBW/kg (Calculated) : 61.6  Heparin Dosing Weight: 75 kg  Vital Signs: Temp: 98.7 F (37.1 C) (08/06 1539) Temp src: Oral (08/06 1539) BP: 114/63 mmHg (08/06 1539) Pulse Rate: 80  (08/06 1539)  Labs:  Basename 05/03/12 1113  HGB 8.1*  HCT 23.7*  PLT 136*  APTT 40*  LABPROT 17.2*  INR 1.38  HEPARINUNFRC --  CREATININE 1.01  CKTOTAL --  CKMB --  TROPONINI --    Estimated Creatinine Clearance: 51.7 ml/min (by C-G formula based on Cr of 1.01).   Medical History: Past Medical History  Diagnosis Date  . Anxiety   . Hypertension   . Hyperlipidemia   . Depression   . Osteopenia   . Multiple myeloma 03/2012  . Acute renal failure      due to hypercalcemia and suspected multiple myeloma resolved =with IVF's    . Lytic bone lesions on xray   . Acute subdural hematoma 03/2012     recent fall in hospital stay,resolved+  . Normocytic anemia     due to chemo and multiple myeloma,no active bleding  . Cancer     mul;tiple myeloma, bone lesions r 7th rib,   . Arthritis     spine    Medications:  Prescriptions prior to admission  Medication Sig Dispense Refill  . acyclovir (ZOVIRAX) 400 MG tablet Take 400 mg by mouth daily.      Marland Kitchen ALPRAZolam (XANAX) 1 MG tablet Take 1 mg by mouth at bedtime as needed. Sleep.      . cyclobenzaprine (FLEXERIL) 5 MG tablet Take 5 mg by mouth. Take 1 tablet every 8 hours      . dexamethasone (DECADRON) 4 MG tablet Take 40 mg by mouth every Tuesday. with chemotherapy drug      . enoxaparin (LOVENOX) 40 MG/0.4ML injection Inject 0.4 mLs (40 mg total) into the skin daily.  30 Syringe  3  . fentaNYL (DURAGESIC - DOSED MCG/HR) 12 MCG/HR Place 2  patches (25 mcg total) onto the skin every 3 (three) days.  10 patch  0  . fentaNYL (DURAGESIC - DOSED MCG/HR) 25 MCG/HR Place 1 patch (25 mcg total) onto the skin every 3 (three) days.  5 patch  0  . HYDROcodone-acetaminophen (VICODIN) 5-500 MG per tablet Take 1 tablet by mouth every 6 (six) hours as needed for pain.  100 tablet  0  . lidocaine (LIDODERM) 5 % Place 2 patches onto the skin daily. Remove & Discard patch within 12 hours or as directed by MD  60 patch  1  . ondansetron (ZOFRAN) 4 MG tablet Take 4 mg by mouth. Take 1 tablet every 8 hours as needed for nausea      . oxyCODONE (OXY IR/ROXICODONE) 5 MG immediate release tablet Take 5-10 mg by mouth every 4 (four) hours as needed. pain      . oxyCODONE-acetaminophen (PERCOCET) 7.5-325 MG per tablet Take by mouth. Take 1 tablet every 4-6 hours as needed.  7.5-325mg       . polyethylene glycol powder (GLYCOLAX/MIRALAX) powder Take 17 g by mouth 2 (two) times daily. Take as directed      . prochlorperazine (COMPAZINE) 10 MG tablet Take 10 mg by  mouth. Take 1 tablet every 6 hours as directed      . QUEtiapine (SEROQUEL) 100 MG tablet Take 100 mg by mouth. Take 1 tablet daily      . rosuvastatin (CRESTOR) 10 MG tablet Take 10 mg by mouth at bedtime.       . sulfamethoxazole-trimethoprim (BACTRIM DS,SEPTRA DS) 800-160 MG per tablet Take 1 tablet by mouth every Monday, Wednesday, and Friday.  30 tablet  3  . traMADol (ULTRAM) 50 MG tablet Take 25 mg by mouth at bedtime as needed. pain        Assessment: 76 year old woman with multiple myeloma admitted to the hospital today after a fall and new onset AFib.  She is on Lovenox at home 40mg  SQ daily for prophylaxis.  Dose to be increased to full anticoagulation dose.  Estimated CrCl is 50 mL/min, so can use 1mg /kg q12 Goal of Therapy:  Anti-Xa level 0.6-1.2 units/ml 4hrs after LMWH dose given Monitor platelets by anticoagulation protocol: Yes   Plan:  Lovenox 75 mg q12.  CBC every 3  days.  Mickeal Skinner 05/03/2012,4:15 PM

## 2012-05-03 NOTE — Telephone Encounter (Signed)
Patient's spouse called reporting [name] just fell.  He's called 911 and can hear EMS in background.  Reports she is on her way to [facility].  Will notify providers.

## 2012-05-03 NOTE — Progress Notes (Signed)
PGY-2 Progress Note  Patient currently in sinus rhythm with HR in 80's for the last 3 hours. Contacted cardiology on call concerning treatment since patient is stable. Dr. Gala Romney recommended rate control with oral beta blocker and possible discharge with outpatient follow up. Discussed with patient who would rather stay tonight and go home in the morning.  Will switch drip to to PO Dilt 180mg  today. Will monitor on telemetry overnight for paroxsymal atrial fibrillation. Will monitor HR and BP, and if patient remains stable she can be discharged tomorrow. Echo has been ordered, but patient will not need to stay inpatient for this if it has not been performed prior to discharge. Patient will follow up as an outpatient at [facility] within the next week. Patient agrees with this plan.  I greatly appreciate Dr. Prescott Gum input on this patient. If we do need inpatient formal consult, I will let him know.  Amber M. Hairford, M.D. 05/03/2012 5:36 PM

## 2012-05-03 NOTE — ED Notes (Signed)
Lynelle Doctor, MD notified re: BP 86/53, instructed to continue with Cardizem drip @ 5 mg/hr & continue to monitor BP

## 2012-05-03 NOTE — ED Provider Notes (Signed)
History     CSN: [id number] Arrival date & time 05/03/12  1037 First MD Initiated Contact with Patient 05/03/12 1057     CC: weakness  HPI Pt was trying to stand up this am when she suddenly felt weak and could not stand up.  She fell to the ground.  Pt denies any pain with this fall this am.  Pt lives at home.   Pt has history of multiple myeloma is getting radiation treatment tomorrow.    She has also had chemotherapy treatments (3 treatments,  Last 1 week ago).  Pt has noticed that her heart is fluttering.  She has not had a history of the heart fluttering before.  Past Medical History  Diagnosis Date  . Anxiety   . Hypertension   . Hyperlipidemia   . Depression   . Osteopenia   . Multiple myeloma 03/2012  . Acute renal failure      due to hypercalcemia and suspected multiple myeloma resolved =with IVF's    . Lytic bone lesions on xray   . Acute subdural hematoma 03/2012     recent fall in hospital stay,resolved+  . Normocytic anemia     due to chemo and multiple myeloma,no active bleding  . Cancer     mul;tiple myeloma, bone lesions r 7th rib,   . Arthritis     spine    Past Surgical History  Procedure Date  . Breast lumpectomy   . 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 Tonsillectomy     age 53    Family History  Problem Relation Age of Onset  . Heart disease Mother   . Hyperlipidemia Sister   . Hypertension Sister   . Diabetes Sister   . Diabetes Sister   . Hyperlipidemia Sister   . Hypertension Sister   . Sudden death Neg Hx   . Heart attack Neg Hx     History  Substance Use Topics  . Smoking status: Never Smoker   . Smokeless tobacco: Never Used  . Alcohol Use: No    OB History    Grav Para Term Preterm Abortions TAB SAB Ect Mult Living                  Review of Systems  Constitutional: Negative for fever.  Respiratory: Negative for cough.     Cardiovascular: Negative for chest pain.  Neurological: Negative for syncope, speech difficulty and headaches.  All other systems reviewed and are negative.    Allergies  Simvastatin  Home Medications   Current Outpatient Rx  Name Route Sig Dispense Refill  . ALPRAZOLAM 1 MG PO TABS Oral Take 1 mg by mouth at bedtime as needed. Sleep.    Marland Kitchen BORTEZOMIB CHEMO SQ INJECTION 3.5 MG Subcutaneous Inject 1 mL (2.5 mg total) into the skin once.    . CYCLOBENZAPRINE HCL 5 MG PO TABS Oral Take 5 mg by mouth. Take 1 tablet every 8 hours    . DEXAMETHASONE 4 MG PO TABS  Take 10 tablets once a week with chemotherapy drug. 30 tablet 1  . ENOXAPARIN SODIUM 40 MG/0.4ML [place] SOLN Subcutaneous Inject 0.4 mLs (40 mg total) into the skin daily. 30 Syringe 3    To prevent blood clot while on chemo for myeloma.  . FENTANYL 12 MCG/HR TD PT72 Transdermal Place 2 patches (25 mcg total) onto the skin every 3 (three) days. 10 patch 0  .  FENTANYL 25 MCG/HR TD PT72 Transdermal Place 1 patch (25 mcg total) onto the skin every 3 (three) days. 5 patch 0  . HYDROCODONE-ACETAMINOPHEN 5-500 MG PO TABS Oral Take 1 tablet by mouth every 6 (six) hours as needed for pain. 100 tablet 0  . LENALIDOMIDE 15 MG PO CAPS  Do not use till cleared by Dr. Gaylyn Rong. 21 capsule 0  . LIDOCAINE 5 % EX PTCH Transdermal Place 2 patches onto the skin daily. Remove & Discard patch within 12 hours or as directed by MD 60 patch 1  . MELOXICAM 15 MG PO TABS Oral Take 15 mg by mouth. Take 1 tablet daily    . ONDANSETRON HCL 4 MG PO TABS Oral Take 4 mg by mouth. Take 1 tablet every 8 hours as needed for nausea    . OXYCODONE-ACETAMINOPHEN 7.5-325 MG PO TABS Oral Take by mouth. Take 1 tablet every 4-6 hours as needed.  7.5-325mg     . POLYETHYLENE GLYCOL 3350 PO POWD  Take as directed    . PREDNISONE 10 MG PO TABS Oral Take 10 mg by mouth. Take as directed    . PROCHLORPERAZINE MALEATE 10 MG PO TABS Oral Take 10 mg by mouth. Take 1 tablet every 6 hours as  directed    . QUETIAPINE FUMARATE 100 MG PO TABS Oral Take 100 mg by mouth. Take 1 tablet daily    . ROSUVASTATIN CALCIUM 10 MG PO TABS Oral Take 10 mg by mouth daily.    . SULFAMETHOXAZOLE-TRIMETHOPRIM 800-160 MG PO TABS Oral Take 1 tablet by mouth every Monday, Wednesday, and Friday. 30 tablet 3    To prevent bacterial infection while on chemo for  ...  . TRAMADOL HCL 50 MG PO TABS Oral Take 50 mg by mouth. Take 1/2 tablet at bedtime      BP 101/51  Pulse 136  Temp 97.7 F (36.5 C) (Oral)  Resp 26  SpO2 99%  Physical Exam  Nursing note and vitals reviewed. Constitutional: No distress.  HENT:  Head: Normocephalic and atraumatic.  Right Ear: External ear normal.  Left Ear: External ear normal.  Eyes: Conjunctivae are normal. Right eye exhibits no discharge. Left eye exhibits no discharge. No scleral icterus.  Neck: Neck supple. No tracheal deviation present.  Cardiovascular: Intact distal pulses.  An irregularly irregular rhythm present. Tachycardia present.   Pulmonary/Chest: Effort normal and breath sounds normal. No stridor. No respiratory distress. She has no wheezes. She has no rales.  Abdominal: Soft. Bowel sounds are normal. She exhibits no distension. There is no tenderness. There is no rebound and no guarding.  Musculoskeletal: She exhibits no edema and no tenderness.       Right hip: Normal.       Left hip: Normal.       Cervical back: Normal.       Thoracic back: Normal.       Lumbar back: Normal.       Small abrasion right knee, no edema or gross deformity  Neurological: She is alert. She has normal strength. No sensory deficit. Cranial nerve deficit:  no gross defecits noted. She exhibits normal muscle tone. She displays no seizure activity. Coordination normal.  Skin: Skin is warm and dry. No rash noted. She is not diaphoretic. There is pallor.  Psychiatric: She has a normal mood and affect.    ED Course  Procedures (including critical care time)  Date:  05/03/2012  Rate: 110  Rhythm: atrial fibrillation  QRS Axis: normal  Intervals: normal  ST/T Wave abnormalities: nonspecific ST/T changes  Conduction Disutrbances:none  Narrative Interpretation: Atrial fibrillation is new  Old EKG Reviewed: changes noted   Labs Reviewed  CBC - Abnormal; Notable for the following:    RBC 2.68 (*)     Hemoglobin 8.1 (*)     HCT 23.7 (*)     Platelets 136 (*)     All other components within normal limits  COMPREHENSIVE METABOLIC PANEL - Abnormal; Notable for the following:    Sodium 134 (*)     Glucose, Bld 104 (*)     Calcium 7.9 (*)     Albumin 2.3 (*)     GFR calc non Af Amer 53 (*)     GFR calc Af Amer 62 (*)     All other components within normal limits  PROTIME-INR - Abnormal; Notable for the following:    Prothrombin Time 17.2 (*)     All other components within normal limits  APTT - Abnormal; Notable for the following:    aPTT 40 (*)     All other components within normal limits  PRO B NATRIURETIC PEPTIDE - Abnormal; Notable for the following:    Pro B Natriuretic peptide (BNP) 853.3 (*)     All other components within normal limits  POCT I-STAT TROPONIN I   Dg Chest Portable 1 View  05/03/2012  *RADIOLOGY REPORT*  Clinical Data: Fall, tachycardia with palpitations  PORTABLE CHEST - 1 VIEW  Comparison: CT thorax 04/06/2012  Findings: Mild enlarged heart silhouette.  There is no effusion, infiltrate, or pneumothorax.  Mild central venous congestion is present.  Right chest wall mass is decreased in size from comparison CT.  IMPRESSION:  1.  No acute cardiopulmonary findings. 2.  Decrease in volume of right chest wall mass.  Original Report Authenticated By: Genevive Bi, M.D.     1. Atrial fibrillation with rapid ventricular response   2. Anemia       MDM  Patient noted to be in rapid atrial fibrillation with rates up into the 140s. Cardizem drip will be started.  Patient has improved somewhat with her Cardizem drip but she  does continue to be borderline hypotensive. Her anemia appears to be chronic but stable. This is likely related to her cancer. She may potentially benefit from blood transfusion. I will consult the medical service for admission considering her multiple comorbidities. She may benefit from cardiology consultations.  Will continue to monitor BP closely.  Her rate has improved but she still does continue to have a boderline BP.  Will continue fluid bolus.       Celene Kras, MD 05/03/12 [phone]

## 2012-05-03 NOTE — ED Notes (Signed)
MD Lynelle Doctor notified of patient HR and vital signs. EKG given to MD.

## 2012-05-04 ENCOUNTER — Telehealth: Payer: Self-pay | Admitting: Clinical

## 2012-05-04 ENCOUNTER — Ambulatory Visit: Payer: Medicare Other | Admitting: Family Medicine

## 2012-05-04 DIAGNOSIS — Z0271 Encounter for disability determination: Secondary | ICD-10-CM

## 2012-05-04 DIAGNOSIS — R1310 Dysphagia, unspecified: Secondary | ICD-10-CM

## 2012-05-04 DIAGNOSIS — C9000 Multiple myeloma not having achieved remission: Secondary | ICD-10-CM

## 2012-05-04 DIAGNOSIS — S065X9A Traumatic subdural hemorrhage with loss of consciousness of unspecified duration, initial encounter: Secondary | ICD-10-CM

## 2012-05-04 LAB — URINALYSIS, ROUTINE W REFLEX MICROSCOPIC
Glucose, UA: NEGATIVE mg/dL
Specific Gravity, Urine: 1.015 (ref 1.005–1.030)
Urobilinogen, UA: 0.2 mg/dL (ref 0.0–1.0)

## 2012-05-04 LAB — COMPREHENSIVE METABOLIC PANEL
BUN: 12 mg/dL (ref 6–23)
Calcium: 7.6 mg/dL — ABNORMAL LOW (ref 8.4–10.5)
GFR calc Af Amer: 74 mL/min — ABNORMAL LOW (ref >90)
Glucose, Bld: 100 mg/dL — ABNORMAL HIGH (ref 70–99)
Total Protein: 6.1 g/dL (ref 6.0–8.3)

## 2012-05-04 LAB — CARDIAC PANEL(CRET KIN+CKTOT+MB+TROPI)
CK, MB: 1.6 ng/mL (ref 0.3–4.0)
Relative Index: INVALID (ref 0.0–2.5)
Total CK: 18 U/L (ref 7–177)
Total CK: 15 U/L (ref 7–177)
Troponin I: <0.3 ng/mL (ref <0.30)

## 2012-05-04 LAB — URINE MICROSCOPIC-ADD ON

## 2012-05-04 LAB — CBC
HCT: 22.3 % — ABNORMAL LOW (ref 36.0–46.0)
Hemoglobin: 7.8 g/dL — ABNORMAL LOW (ref 12.0–15.0)
WBC: 4.2 10*3/uL (ref 4.0–10.5)

## 2012-05-04 MED ORDER — ENOXAPARIN SODIUM 40 MG/0.4ML ~~LOC~~ SOLN
40.0000 mg | Freq: Every day | SUBCUTANEOUS | Status: DC
  Administered 2012-05-04: 40 mg via SUBCUTANEOUS
  Filled 2012-05-04: qty 0.4

## 2012-05-04 MED ORDER — FENTANYL 12 MCG/HR TD PT72: 12.5000 ug | MEDICATED_PATCH | TRANSDERMAL | Status: DC

## 2012-05-04 MED ORDER — FENTANYL 25 MCG/HR TD PT72: 25.0000 ug | MEDICATED_PATCH | TRANSDERMAL | Status: DC

## 2012-05-04 MED ORDER — FENTANYL 12 MCG/HR TD PT72: 1.0000 | MEDICATED_PATCH | TRANSDERMAL | Status: DC

## 2012-05-04 MED ORDER — LENALIDOMIDE 15 MG PO CAPS: ORAL_CAPSULE | ORAL | Status: DC

## 2012-05-04 MED ORDER — DILTIAZEM HCL ER COATED BEADS 180 MG PO CP24: 180.0000 mg | ORAL_CAPSULE | Freq: Every day | ORAL | Status: DC

## 2012-05-04 NOTE — Clinical Social Work Psychosocial (Signed)
Clinical Social Work Department BRIEF PSYCHOSOCIAL ASSESSMENT 05/04/2012  Patient:  [name], [name]     Account Number:  [id number]     Admit date:  05/03/2012  Clinical Social Worker:  Read Drivers  Date/Time:  05/04/2012 10:54 AM  Referred by:  RN  Date Referred:  05/04/2012 Referred for  SNF Placement   Other Referral:   none   Interview type:  Other - See comment Other interview type:   Pt and husband, [name]    PSYCHOSOCIAL DATA Living Status:  HUSBAND Admitted from facility:   Level of care:   Primary support name:  [name] Primary support relationship to patient:  SPOUSE Degree of support available:   good    CURRENT CONCERNS Current Concerns  Post-Acute Placement   Other Concerns:   none    SOCIAL WORK ASSESSMENT / PLAN Pt was in good spirits and interacting with CSW regarding d/c plans.   Assessment/plan status:  Other - See comment Other assessment/ plan:   CSW with f/u with SNF and pt regarding d/c plans   Information/referral to community resources:   SNF    PATIENT'S/FAMILY'S RESPONSE TO PLAN OF CARE: pt and husband, [name] was real receptive to SNF plans. They were both appreciative of CSW assistance.   Vickii Penna, LCSWA [phone]  Clinical Social Work

## 2012-05-04 NOTE — Discharge Summary (Signed)
Physician Discharge Summary  Patient ID: KAMBRIA GRIMA MRN: [id number] DOB/AGE: 1936/08/30 76 y.o.  Admit date: 05/03/2012 Discharge date: 05/04/2012  Admission Diagnoses: New onset atrial fibrillation   Discharge Diagnoses:  Principal Problem:  *New onset atrial fibrillation Active Problems:  HYPERLIPIDEMIA NEC/NOS  ANXIETY  DEPRESSION  HYPERTENSION  Multiple myeloma   Discharged Condition: stable  Hospital Course: Patient is a 76 yo F with PMH of multiple myeloma (currently undergoing treatment), HTN, depression presenting with c/o fall secondary to weakness found to have new onset atrial fibrillation.  1. Atrial Fibrillation In the ED, patient was found to have atrial fibrillation with HR of 136.  She was admitted and started on Cardiazem drip at 5-15 mcg for rate control with goal HR of less than 110.  Upon arrival to the medical floor her rhythm converted to normal sinus rhythm.  Cardiology was consulted and recommended transition to PO Cardiazem 180 mg.  They also recommended outpatient Echocardiogram.  Patient remained stable following oral Cardiazem and was clinically stable at discharge.  Of note, etiology/inciting event of atrial fibrillation was unclear.  Cardiac enzymes were negative x 3.  Prior TSH from last admission was within normal limits.  ProBNP was elevated at 853, but there were no clinical signs of volume overload.  2. Multiple Myeloma Patient was recently diagnosed with multiple myeloma and is currently undergoing treatment.  Hematology/Oncology was informed about admission.  Patients pain was closely monitored and treated with Oxycodone, Ultram, and Fentanyl (patch) during admission.  Patient's fentanyl patch was increased during hospitalization to total 37.5 mg (25 mcg + 12.5).  Patient has follow up appointment tomorrow with Radiation oncology and Medical Oncology as indicated below.  3. Depression & Anxiety Patient was continued on her home medications and was  stable during hospitalization.   4. Renal insufficiency  Patient has had prior renal insufficieny.  Lab work during admission showed normal creatinine as below.    Consults: cardiology  Significant Diagnostic Studies:   05/03/2012   Sodium 134 (L)  Potassium 3.7  Chloride 101  CO2 19  BUN 17  Creat 1.01  Calcium 7.9 (L)  GFR calc non Af Amer 53 (L)  GFR calc Af Amer 62 (L)  Glucose 104 (H)  Alkaline Phosphatase 78  Albumin 2.3 (L)  AST 9  ALT 11  Total Protein 6.6  Total Bilirubin 0.5    05/03/2012   WBC 4.6  RBC 2.68 (L)  Hemoglobin 8.1 (L)  HCT 23.7 (L)  MCV 88.4  MCH 30.2  MCHC 34.2  RDW 14.7  Platelets 136 (L)  Troponin neg x3 BNP 853 CXR: unremarkable.  Treatments: Diltiazem drip until converted back to sinus, then d/c and switch to PO  Discharge Exam: Blood pressure 87/56, pulse 74, temperature 98.6 F (37 C), temperature source Oral, resp. rate 18, height 5\' 7"  (1.702 m), weight 165 lb 5.5 oz (75 kg), SpO2 96.00%. General: alert and awake. Eating breakfast this am. NAD.  Cardiovascular: RRR. 2-3/6 systolic murmur Left Sternal border  Respiratory: CTAB.  Abdomen: soft, nontender, nondistended.  Extremities: no cyanosis, clubbing or edema noted.  Disposition: Stable. Patient discharge to SNF -  [facility], [place], [place]  Discharge Orders    Future Appointments: Provider: Department: Dept Phone: Center:   05/05/2012 10:00 AM Jonna Coup, MD [facility] [phone] None     Joint Appt [facility] Sim 1 [facility] [phone] None   05/05/2012 11:00 AM [facility] Financial Counselor [facility] [phone] None  05/05/2012 1:15 PM Radene Gunning [facility] [phone] None   05/05/2012 1:45 PM Myrtis Ser, NP [facility] [phone] None   05/16/2012 1:30 PM Windell Hummingbird [facility] [phone] None   05/19/2012 10:30 AM Exie Parody, MD [facility] [phone] None   05/19/2012 11:45 AM  [facility] H31 [facility] [phone] None   05/24/2012 11:20 AM Ranelle Oyster, MD [facility] [phone] CPR   05/26/2012 12:45 PM [facility] C10 [facility] [phone] None   06/02/2012 2:00 PM [facility] B4 [facility] [phone] None   06/16/2012 11:30 AM [facility] B7 [facility] [phone] None   06/23/2012 2:15 PM [facility] A1 [facility] [phone] None   06/30/2012 2:15 PM [facility] B5 [facility] [phone] None   07/14/2012 10:45 AM [facility] B7 [facility] [phone] None   07/21/2012 12:00 PM [facility] A2 [facility] [phone] None   07/28/2012 2:15 PM [facility] B4 [facility] [phone] None   08/11/2012 2:15 PM [facility] A3 [facility] [phone] None   09/08/2012 1:45 PM [facility] A1 [facility] [phone] None   10/06/2012 2:00 PM [facility] A2 [facility] [phone] None     Future Orders Please Complete By Expires   Diet - low sodium heart healthy      Increase activity slowly        Medication List  As of 05/04/2012  2:43 PM   TAKE these medications         acyclovir 400 MG tablet   Commonly known as: ZOVIRAX   Take 400 mg by mouth daily.      ALPRAZolam 1 MG tablet   Commonly known as: XANAX   Take 1 mg by mouth at bedtime as needed. Sleep.      cyclobenzaprine 5 MG tablet   Commonly known as: FLEXERIL   Take 5 mg by mouth. Take 1 tablet every 8 hours      dexamethasone 4 MG tablet   Commonly known as: DECADRON   Take 40 mg by mouth every Tuesday. with chemotherapy drug      diltiazem 180 MG 24 hr capsule   Commonly known as: CARDIZEM CD   Take 1 capsule (180 mg total) by mouth daily.      enoxaparin 40 MG/0.4ML injection   Commonly known as: LOVENOX   Inject 0.4 mLs (40 mg total) into the skin daily.      fentaNYL 25 MCG/HR   Commonly known as: DURAGESIC - dosed mcg/hr   Place 1 patch (25 mcg total) onto the skin every 3 (three) days.      fentaNYL 12 MCG/HR   Commonly known  as: DURAGESIC - dosed mcg/hr   Place 1 patch (12.5 mcg total) onto the skin every 3 (three) days.      HYDROcodone-acetaminophen 5-500 MG per tablet   Commonly known as: VICODIN   Take 1 tablet by mouth every 6 (six) hours as needed for pain.      lenalidomide 15 MG capsule   Commonly known as: REVLIMID   This is your home medication. Please continue taking it as prescribed.      lidocaine 5 %   Commonly known as: LIDODERM   Place 2 patches onto the skin daily. Remove & Discard patch within 12 hours or as directed by MD      ondansetron 4 MG tablet   Commonly known as: ZOFRAN   Take 4 mg by mouth. Take 1 tablet every 8 hours as needed for nausea  oxyCODONE 5 MG immediate release tablet   Commonly known as: Oxy IR/ROXICODONE   Take 5-10 mg by mouth every 4 (four) hours as needed. pain      oxyCODONE-acetaminophen 7.5-325 MG per tablet   Commonly known as: PERCOCET   Take by mouth. Take 1 tablet every 4-6 hours as needed.  7.5-325mg       polyethylene glycol powder powder   Commonly known as: GLYCOLAX/MIRALAX   Take 17 g by mouth 2 (two) times daily. Take as directed      prochlorperazine 10 MG tablet   Commonly known as: COMPAZINE   Take 10 mg by mouth. Take 1 tablet every 6 hours as directed      QUEtiapine 100 MG tablet   Commonly known as: SEROQUEL   Take 100 mg by mouth. Take 1 tablet daily      rosuvastatin 10 MG tablet   Commonly known as: CRESTOR   Take 10 mg by mouth at bedtime.      sulfamethoxazole-trimethoprim 800-160 MG per tablet   Commonly known as: BACTRIM DS,SEPTRA DS   Take 1 tablet by mouth every Monday, Wednesday, and Friday.      traMADol 50 MG tablet   Commonly known as: ULTRAM   Take 25 mg by mouth at bedtime as needed. pain           Follow-up Information    Call [facility] (Dr. Daleen Squibb is arranging follow up. Please call the office to confirm appointments)    Contact information:   [address] [place]  [place] [address]  [phone]       Schedule an appointment as soon as possible for a visit with METHENEY,CATHERINE, MD. (Please call today to make an appointment for Friday, if possible)    Contact information:   [address]  [address]  [place] [address] [phone]       Follow up with Radiation Oncology. (As scheduled)         Recommendation for Follow-up: - Please closely monitor patient's blood pressure as she may have drops with position changes on Diltiazem (new medication) - Pain control continues to be an issue. Please consider following closely and making changes appropriately for adequate pain control. Patient's fentanyl patch was increased to 37.5 mcg (12.5 + 25 mcg) during admission. - Please make sure patient has cardiology appointment and was able to obtain echocardiogram  Signed: Everlene Other 05/04/2012, 2:43 PM

## 2012-05-04 NOTE — Progress Notes (Signed)
Clinical Child psychotherapist (CSW) prepared and placed dc summary in pt shadow chart. CSW informed pt and spouse of dc to [facility] both agreeable. CSW provided RN with facility contact information to give report. CSW has called [facility] for a 16:00 transport to [facility]. No further CSW needs addressed. CSW signing off.  Theresia Bough, MSW, Theresia Majors [phone]

## 2012-05-04 NOTE — Telephone Encounter (Signed)
Note from this CSW on 05/04/2012 12:02 PM was entered in error please disregard. The note was for a different patient, please advise.  Theresia Bough, MSW, Theresia Majors [phone]

## 2012-05-04 NOTE — Progress Notes (Signed)
Family Medicine Teaching Service Daily Progress Note Service Page: [phone]  Patient Assessment:  76 yo F with multiple myeloma, HTN and renal insufficieny who presented with acute onset of atrial fibrillation.  Subjective:  Patient is feeling well this morning.   Denies pain, SOB, chest pain.  Does report some weakness.   Objective: Temp:  [97.7 F (36.5 C)-98.7 F (37.1 C)] 98 F (36.7 C) (08/07 0446) Pulse Rate:  [80-136] 94  (08/07 0446) Resp:  [18-26] 18  (08/07 0446) BP: (79-116)/(46-68) 103/67 mmHg (08/07 0446) SpO2:  [94 %-99 %] 94 % (08/07 0446) Weight:  [165 lb 5.5 oz (75 kg)] 165 lb 5.5 oz (75 kg) (08/06 1539)  Exam: General: alert and awake. Eating breakfast this am. NAD. Cardiovascular: RRR. 2-3/6 systolic murmur Left Sternal border Respiratory: CTAB.  Abdomen: soft, nontender, nondistended. Extremities: no cyanosis, clubbing or edema noted.  I have reviewed the patient's medications, labs, imaging, and diagnostic testing.  Notable results are summarized below.  CBC    Component Value Date/Time   WBC 4.6 05/03/2012 1113   WBC 4.8 04/26/2012 0804   RBC 2.68* 05/03/2012 1113   RBC 2.92* 04/26/2012 0804   HGB 8.1* 05/03/2012 1113   HGB 9.1* 04/26/2012 0804   HCT 23.7* 05/03/2012 1113   HCT 26.5* 04/26/2012 0804   PLT 136* 05/03/2012 1113   PLT 159 04/26/2012 0804   MCV 88.4 05/03/2012 1113   MCV 90.7 04/26/2012 0804   MCH 30.2 05/03/2012 1113   MCH 31.3 04/26/2012 0804   MCHC 34.2 05/03/2012 1113   MCHC 34.5 04/26/2012 0804   RDW 14.7 05/03/2012 1113   RDW 13.9 04/26/2012 0804   LYMPHSABS 1.2 04/26/2012 0804   LYMPHSABS 2.4 04/21/2012 0646   MONOABS 0.3 04/26/2012 0804   MONOABS 0.9 04/21/2012 0646   EOSABS 0.2 04/26/2012 0804   EOSABS 0.1 04/21/2012 0646   BASOSABS 0.0 04/26/2012 0804   BASOSABS 0.0 04/21/2012 0646    CMP     Component Value Date/Time   NA 134* 05/03/2012 1113   K 3.7 05/03/2012 1113   CL 101 05/03/2012 1113   CO2 19 05/03/2012 1113   GLUCOSE 104* 05/03/2012 1113     GLUCOSE 96 09/30/2006 1322   BUN 17 05/03/2012 1113   CREATININE 1.01 05/03/2012 1113   CREATININE 1.18* 03/14/2012 0914   CALCIUM 7.9* 05/03/2012 1113   CALCIUM 11.8* 04/04/2012 0420   PROT 6.6 05/03/2012 1113   ALBUMIN 2.3* 05/03/2012 1113   AST 9 05/03/2012 1113   ALT 11 05/03/2012 1113   ALKPHOS 78 05/03/2012 1113   BILITOT 0.5 05/03/2012 1113   GFRNONAA 53* 05/03/2012 1113   GFRAA 62* 05/03/2012 1113    Imaging/Diagnostic Tests: Cardiac Panel (last 3 results)  Basename 05/03/12 2314 05/03/12 1606  CKTOTAL 18 20  CKMB 2.2 2.7  TROPONINI <0.30 <0.30  RELINDX RELATIVE INDEX IS INVALID RELATIVE INDEX IS INVALID   BNP    Component Value Date/Time   PROBNP 853.3* 05/03/2012 1114   Lab Results  Component Value Date   HGBA1C 6.6* 05/03/2012    Chest Xray 05/03/2012. IMPRESSION:  1.  No acute cardiopulmonary findings. 2.  Decrease in volume of right chest wall mass.    Plan: 1) New onset Atrial Fibrillation - Patient converted following cardiazem drip. - Cardiac Enzymes negative x 2. 3rd set ordered for this am. - Patient currently stable on Cardiazem 180 mg - Outpatient cardiology followup with Echo  2) Multiple myeloma - Heme/Onc aware of admission -  Continue all home medications  - Will closely monitor for pain control.  - Fentanyl increased to 12.5 + 25 mcg  3)  Anxiety & Depression - Continue Seroquel 100mg  whs  - Continue Xanax qhs prn   4) Renal insufficiency - Creatinine stable at this time - 1.01 - CMP this am.  FEN/GI - Heart healthy diet this am. PPX: Lovenox 40 mg Dispo - Currently Stable. Discharge tomorrow to SNF.  Code: Chana Bode, DO 05/04/2012, 7:29 AM

## 2012-05-04 NOTE — Progress Notes (Signed)
Discharge orders received and patient education is completed. Patient and husband given discharge, medication, and follow up instructions and verbalized understanding.  Patient to be transferred to [facility] via [facility].  Report called to receiving nurse at facility, [facility].  Safire Gordin, [place] Marie, RN

## 2012-05-04 NOTE — Telephone Encounter (Signed)
Not below entered in error. Incorrect patient.  Theresia Bough, MSW, Theresia Majors [phone]

## 2012-05-04 NOTE — Discharge Summary (Signed)
I interviewed and examined this patient and discussed the care plan with Dr. Adriana Simas and the [place] team and agree with assessment and plan as documented in the progress note for today. I emphasized the need to decrease fall risk by improving her strength and better controlling her pain. I recommend that she be weaned off medications like Cyclobenzaprine and benzodiazepines that increase confusion and falls in elderly patients.     Loraina Stauffer A. Sheffield Slider, MD Family Medicine Teaching Service Attending  05/04/2012 3:35 PM

## 2012-05-04 NOTE — Progress Notes (Signed)
CSW received pt from RN.  CSW referred this pt to Teaching Service CSW, Theresia Bough, [place].                Vickii Penna, LCSWA [phone]  Clinical Social Work

## 2012-05-04 NOTE — Progress Notes (Signed)
I interviewed and examined this patient and discussed the care plan with Dr. Adriana Simas and the [facility] team and agree with assessment and plan as documented in the progress note for today. Her orthostatic hypotension likely relates to the Cardiazem plus other medications. She is high fall risk, and she agrees that she will always ask for someone to be with her when she stands up.     [name] A. Sheffield Slider, MD Family Medicine Teaching Service Attending  05/04/2012 3:20 PM

## 2012-05-04 NOTE — Progress Notes (Signed)
[facility]MTS Clinical Child psychotherapist (CSW) provided pt and spouse with bed offers. Pt and spouse have chosen [facility] in [place]. CSW informed that pt has an appointment tomorrow at the cancer center at 10:00am therefore a dc today would be ideal per family. CSW has informed FMTS and will await a decision as to whether pt will dc today.  Theresia Bough, MSW, Theresia Majors [phone]

## 2012-05-04 NOTE — Care Management Note (Unsigned)
    Page 1 of 1   05/04/2012     11:04:25 AM   CARE MANAGEMENT NOTE 05/04/2012  Patient:  [name], [name]   Account Number:  [id number]  Date Initiated:  05/04/2012  Documentation initiated by:  SIMMONS,[place]  Subjective/Objective Assessment:   ADMITTED WITH AFIB/ RVR; LIVES AT HOME WITH HUSBAND- BILLY; HAS DME- RW, BSC, WC; USES [facility] IN [place] FOR RX.     Action/Plan:   DISCHARGE PLANNING DISCUSSED AT BEDSIDE.   Anticipated DC Date:  05/05/2012   Anticipated DC Plan:  SKILLED NURSING FACILITY  In-house referral  Clinical Social Worker      DC Planning Services  CM consult      Choice offered to / List presented to:             Status of service:  In process, will continue to follow Medicare Important Message given?   (If response is "NO", the following Medicare IM given date fields will be blank) Date Medicare IM given:   Date Additional Medicare IM given:    Discharge Disposition:    Per UR Regulation:  Reviewed for med. necessity/level of care/duration of stay  If discussed at Long Length of Stay Meetings, dates discussed:    Comments:  05/04/12  1103  Hazyl Marseille SIMMONS RN, BSN [phone] CSW FOLLOWING.

## 2012-05-04 NOTE — Telephone Encounter (Deleted)
Clinical Child psychotherapist (CSW) received a call from pt with concerns regarding pt behavior. CSW familiar with pt concerns and has previously provided pt with resources. CSW given consent to speak to pt spouse [name] regarding concerns pt has. CSW spoke with [name] about pt concerns regarding his aggressive verbal behavior towards pt. [name] stated "she is lying!" and "I'm minding my own business." CSW also informed [name] of concerns regarding his non compliance with following up with his appointments and history of not taking medications. [name] stated he doesn't need to see the doctor and that he takes his medications everyday. From CSW interaction with [name] over the phone he seemed recepective towards CSW, was not belligerent however denied negative behavior. CSW informed [name] that CSW will continue to follow up with family. [name] agreeable. CSW will discuss case with supervisor and determine whether an APS report is even appropriate. Both pt and spouse have capacity. Spouse can leave the home however states she has nowhere to go. Pt not agreeable to leaving her son and getting a hotel/shelter. Pt has a $400 income a month/ CSW encouraged pt to go to DSS and apply for food stamps/medicaid & possible resources to address concerns. Pt agreeable to call 911 if she feels unsafe, denies SI/HI. [name], MSW, [name] [phone]

## 2012-05-05 ENCOUNTER — Ambulatory Visit: Payer: Medicare Other | Admitting: Oncology

## 2012-05-05 ENCOUNTER — Telehealth: Payer: Self-pay | Admitting: *Deleted

## 2012-05-05 ENCOUNTER — Other Ambulatory Visit (HOSPITAL_BASED_OUTPATIENT_CLINIC_OR_DEPARTMENT_OTHER): Payer: Medicare Other | Admitting: Lab

## 2012-05-05 ENCOUNTER — Other Ambulatory Visit: Payer: Medicare Other | Admitting: Lab

## 2012-05-05 ENCOUNTER — Encounter: Payer: Self-pay | Admitting: *Deleted

## 2012-05-05 ENCOUNTER — Ambulatory Visit
Admission: RE | Admit: 2012-05-05 | Discharge: 2012-05-05 | Disposition: A | Discharge status: Home / Self Care | Payer: Medicare Other | Source: Ambulatory Visit | Attending: Radiation Oncology | Admitting: Radiation Oncology

## 2012-05-05 ENCOUNTER — Ambulatory Visit (HOSPITAL_BASED_OUTPATIENT_CLINIC_OR_DEPARTMENT_OTHER): Payer: Medicare Other | Admitting: Oncology

## 2012-05-05 VITALS — BP 83/64 | HR 53 | Temp 97.5°F | Resp 20 | Ht 67.0 in | Wt 170.3 lb

## 2012-05-05 DIAGNOSIS — C9000 Multiple myeloma not having achieved remission: Secondary | ICD-10-CM

## 2012-05-05 DIAGNOSIS — M25559 Pain in unspecified hip: Secondary | ICD-10-CM

## 2012-05-05 DIAGNOSIS — R52 Pain, unspecified: Secondary | ICD-10-CM

## 2012-05-05 DIAGNOSIS — M899 Disorder of bone, unspecified: Secondary | ICD-10-CM

## 2012-05-05 DIAGNOSIS — D649 Anemia, unspecified: Secondary | ICD-10-CM

## 2012-05-05 DIAGNOSIS — R110 Nausea: Secondary | ICD-10-CM

## 2012-05-05 LAB — CBC WITH DIFFERENTIAL/PLATELET
Basophils Absolute: 0 10*3/uL (ref 0.0–0.1)
HCT: 25.4 % — ABNORMAL LOW (ref 34.8–46.6)
HGB: 8.7 g/dL — ABNORMAL LOW (ref 11.6–15.9)
MCH: 31.1 pg (ref 25.1–34.0)
MONO#: 0.8 10*3/uL (ref 0.1–0.9)
NEUT%: 74.2 % (ref 38.4–76.8)
WBC: 6.8 10*3/uL (ref 3.9–10.3)
lymph#: 0.7 10*3/uL — ABNORMAL LOW (ref 0.9–3.3)

## 2012-05-05 LAB — BASIC METABOLIC PANEL
BUN: 13 mg/dL (ref 6–23)
CO2: 18 mEq/L — ABNORMAL LOW (ref 19–32)
Chloride: 102 mEq/L (ref 96–112)
Creatinine, Ser: 1 mg/dL (ref 0.50–1.10)
Glucose, Bld: 136 mg/dL — ABNORMAL HIGH (ref 70–99)

## 2012-05-05 MED ORDER — HYDROCODONE-ACETAMINOPHEN 5-325 MG PO TABS
1.0000 | ORAL_TABLET | Freq: Once | ORAL | Status: AC
  Administered 2012-05-05: 1 via ORAL

## 2012-05-05 NOTE — Progress Notes (Signed)
[facility]  Telephone:(336) [phone] [phone]   OFFICE PROGRESS NOTE   Cc:  METHENEY,CATHERINE, MD   DIAGNOSIS: IgA kappa multiple myeloma. She presented with diffuse lytic bone lesions, renal insufficiency, hypercalcemia, anemia. Her M-spike was 4.06 gm/dL. Her IgA was elevated at 4,020 mg/dL; IgG 353; IgM 5; serum kappa was 2.11; lambda 0.60; serum kappa to lambda ratio elevated at 3.06. Spot urine showed elevated kappa:lambda at 579; and positive for Bence Jones protein. Bone marrow biopsy on 04/08/2012 showed 92% plasma cell. Cytogenetics showed multiple chromosome 1 abnormalities; additional chromosomal material on chromosome 8p; 14q; loss of chromosome 8 and 13. 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   CURRENT THERAPY: Started on SQ Velcade 1.3mg /m2 on 04/12/2012; weekly; 3 weeks on; 1 week off. She started on Revlimid 15mg  PO daily d1-21 on 04/21/2012. She is also on Dexamethasone 40mg  PO once weekly.   INTERVAL HISTORY: [name] 76 y.o. female returns for regular follow up with her husband and son.  She was admitted a few days ago to [facility] for afib with RVR which spontaneously resolved.  She was placed on Diltiazem for rate control and discharged to a SNF.  She still has bilateral hip pain.  She has not been able to ambulate much.  She is weak from deconditioning.   Patient denies fever, headache, visual changes, confusion, drenching night sweats, palpable lymph node swelling, mucositis, odynophagia, dysphagia, nausea vomiting, jaundice, chest pain, palpitation, shortness of breath, dyspnea on exertion, productive cough, gum bleeding, epistaxis, hematemesis, hemoptysis, abdominal pain, abdominal swelling, early satiety, melena, hematochezia, hematuria, skin rash, spontaneous bleeding, joint swelling, heat or cold intolerance, bowel bladder incontinence, paresthesia, depression, suicidal or homicidal ideation, feeling hopelessness.   Past Medical  History  Diagnosis Date  . Anxiety   . Hypertension   . Hyperlipidemia   . Depression   . Osteopenia   . Multiple myeloma 03/2012  . Acute renal failure      due to hypercalcemia and suspected multiple myeloma resolved =with IVF's    . Lytic bone lesions on xray   . Acute subdural hematoma 03/2012     recent fall in hospital stay,resolved+  . Normocytic anemia     due to chemo and multiple myeloma,no active bleding  . Cancer     mul;tiple myeloma, bone lesions r 7th rib,   . Arthritis     spine    Past Surgical History  Procedure Date  . Breast lumpectomy   . 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 Tonsillectomy     age 57    Current Outpatient Prescriptions  Medication Sig Dispense Refill  . acyclovir (ZOVIRAX) 400 MG tablet Take 400 mg by mouth daily.      Marland Kitchen ALPRAZolam (XANAX) 1 MG tablet Take 1 mg by mouth at bedtime as needed. Sleep.      . cyclobenzaprine (FLEXERIL) 5 MG tablet Take 5 mg by mouth. Take 1 tablet every 8 hours      . diltiazem (CARDIZEM CD) 180 MG 24 hr capsule Take 1 capsule (180 mg total) by mouth daily.  30 capsule  2  . enoxaparin (LOVENOX) 40 MG/0.4ML injection Inject 0.4 mLs (40 mg total) into the skin daily.  30 Syringe  3  . fentaNYL (DURAGESIC - DOSED MCG/HR) 12 MCG/HR Place 1 patch (12.5 mcg total) onto the skin every 3 (three) days.  5 patch  0  .  fentaNYL (DURAGESIC - DOSED MCG/HR) 25 MCG/HR Place 1 patch (25 mcg total) onto the skin every 3 (three) days.  5 patch  0  . HYDROcodone-acetaminophen (VICODIN) 5-500 MG per tablet Take 1 tablet by mouth every 6 (six) hours as needed for pain.  100 tablet  0  . lidocaine (LIDODERM) 5 % Place 2 patches onto the skin daily. Remove & Discard patch within 12 hours or as directed by MD  60 patch  1  . ondansetron (ZOFRAN) 4 MG tablet Take 4 mg by mouth. Take 1 tablet every 8 hours as needed for nausea      .  oxyCODONE (OXY IR/ROXICODONE) 5 MG immediate release tablet Take 5-10 mg by mouth every 4 (four) hours as needed. pain      . oxyCODONE-acetaminophen (PERCOCET) 7.5-325 MG per tablet Take by mouth. Take 1 tablet every 4-6 hours as needed.  7.5-325mg       . polyethylene glycol powder (GLYCOLAX/MIRALAX) powder Take 17 g by mouth 2 (two) times daily. Take as directed      . prochlorperazine (COMPAZINE) 10 MG tablet Take 10 mg by mouth. Take 1 tablet every 6 hours as directed      . QUEtiapine (SEROQUEL) 100 MG tablet Take 100 mg by mouth. Take 1 tablet daily      . rosuvastatin (CRESTOR) 10 MG tablet Take 10 mg by mouth at bedtime.       . sulfamethoxazole-trimethoprim (BACTRIM DS,SEPTRA DS) 800-160 MG per tablet Take 1 tablet by mouth every Monday, Wednesday, and Friday.  30 tablet  3  . traMADol (ULTRAM) 50 MG tablet Take 25 mg by mouth at bedtime as needed. pain      . dexamethasone (DECADRON) 4 MG tablet Take 40 mg by mouth every Tuesday. with chemotherapy drug      . lenalidomide (REVLIMID) 15 MG capsule This is your home medication. Please continue taking it as prescribed.  30 capsule  0   Current Facility-Administered Medications  Medication Dose Route Frequency Provider Last Rate Last Dose  . HYDROcodone-acetaminophen (NORCO/VICODIN) 5-325 MG per tablet 1 tablet  1 tablet Oral Once Exie Parody, MD   1 tablet at 05/05/12 1228   Facility-Administered Medications Ordered in Other Visits  Medication Dose Route Frequency Provider Last Rate Last Dose  . DISCONTD: 0.9 %  sodium chloride infusion  1,000 mL Intravenous Continuous Celene Kras, MD      . DISCONTD: 0.9 %  sodium chloride infusion   Intravenous Continuous Hilarie Fredrickson, MD 50 mL/hr at 05/04/12 1053 50 mL/hr at 05/04/12 1053  . DISCONTD: acyclovir (ZOVIRAX) tablet 400 mg  400 mg Oral Daily Hilarie Fredrickson, MD   400 mg at 05/04/12 1029  . DISCONTD: ALPRAZolam Prudy Feeler) tablet 1 mg  1 mg Oral QHS PRN Amber Nydia Bouton, MD      . DISCONTD:  atorvastatin (LIPITOR) tablet 20 mg  20 mg Oral q1800 Hilarie Fredrickson, MD   20 mg at 05/03/12 1809  . DISCONTD: cyclobenzaprine (FLEXERIL) tablet 5 mg  5 mg Oral TID PRN Amber Nydia Bouton, MD      . DISCONTD: diltiazem (CARDIZEM CD) 24 hr capsule 180 mg  180 mg Oral Daily Amber Nydia Bouton, MD   180 mg at 05/04/12 1029  . DISCONTD: enoxaparin (LOVENOX) injection 40 mg  40 mg Subcutaneous Daily Hilarie Fredrickson, MD   40 mg at 05/04/12 1030  . DISCONTD: fentaNYL (DURAGESIC - dosed mcg/hr) 12.5 mcg  12.5 mcg  Transdermal Q72H Tobin Chad, MD      . DISCONTD: fentaNYL (DURAGESIC - dosed mcg/hr) patch 25 mcg  25 mcg Transdermal Q72H Tobin Chad, MD      . DISCONTD: lenalidomide (REVLIMID) capsule 15 mg  15 mg Oral Daily Tobin Chad, MD   15 mg at 05/04/12 1139  . DISCONTD: lidocaine (LIDODERM) 5 % 2 patch  2 patch Transdermal Q24H Hilarie Fredrickson, MD   2 patch at 05/03/12 2035  . DISCONTD: ondansetron (ZOFRAN) tablet 4 mg  4 mg Oral Q8H PRN Amber Nydia Bouton, MD      . DISCONTD: oxyCODONE-acetaminophen (PERCOCET/ROXICET) 5-325 MG per tablet 1.5 tablet  1.5 tablet Oral Q4H PRN Tobin Chad, MD      . DISCONTD: polyethylene glycol (MIRALAX / GLYCOLAX) packet 17 g  17 g Oral BID Tobin Chad, MD   17 g at 05/04/12 1032  . DISCONTD: prochlorperazine (COMPAZINE) tablet 10 mg  10 mg Oral Q6H PRN Amber Nydia Bouton, MD      . DISCONTD: QUEtiapine (SEROQUEL) tablet 100 mg  100 mg Oral QHS Hilarie Fredrickson, MD   100 mg at 05/03/12 2216  . DISCONTD: sodium chloride 0.9 % injection 3 mL  3 mL Intravenous Q12H Hilarie Fredrickson, MD   3 mL at 05/04/12 1033  . DISCONTD: sulfamethoxazole-trimethoprim (BACTRIM DS) 800-160 MG per tablet 1 tablet  1 tablet Oral Q M,W,F Tobin Chad, MD   1 tablet at 05/04/12 1033  . DISCONTD: traMADol (ULTRAM) tablet 25 mg  25 mg Oral QHS PRN Amber Nydia Bouton, MD        ALLERGIES:  is allergic to simvastatin.  REVIEW OF SYSTEMS:  The rest of the 14-point  review of system was negative.   Filed Vitals:   05/05/12 1144  BP: 83/64  Pulse: 53  Temp: 97.5 F (36.4 C)  Resp: 20   Wt Readings from Last 3 Encounters:  05/05/12 170 lb 4.8 oz (77.248 kg)  05/03/12 165 lb 5.5 oz (75 kg)  04/29/12 165 lb 6.4 oz (75.025 kg)   ECOG Performance status: 2  PHYSICAL EXAMINATION:   General:  well-nourished in no acute distress.  Eyes:  no scleral icterus.  ENT:  There were no oropharyngeal lesions.  Neck was without thyromegaly.  Lymphatics:  Negative cervical, supraclavicular or axillary adenopathy.  Respiratory: lungs were clear bilaterally without wheezing or crackles.  Cardiovascular:  Regular rate and rhythm, S1/S2, without murmur, rub or gallop.  There was no pedal edema.  GI:  abdomen was soft, flat, nontender, nondistended, without organomegaly.  Muscoloskeletal:  no spinal tenderness of palpation of vertebral spine.  Skin exam was without echymosis, petichae.  Neuro exam was nonfocal.  Patient was able to get on and off exam table without assistance.  Gait was normal.  Patient was alerted and oriented.  Attention was good.   Language was appropriate.  Mood was normal without depression.  Speech was not pressured.  Thought content was not tangential.     LABORATORY/RADIOLOGY DATA:  Lab Results  Component Value Date   WBC 6.8 05/05/2012   HGB 8.7* 05/05/2012   HCT 25.4* 05/05/2012   PLT 232 05/05/2012   GLUCOSE 136* 05/05/2012   CHOL 255* 03/03/2012   TRIG 117 03/03/2012   HDL 62 03/03/2012   LDLDIRECT 166* 04/24/2011   LDLCALC 170* 03/03/2012   ALKPHOS 81 05/04/2012   ALT 11 05/04/2012   AST 9 05/04/2012   NA 134*  05/05/2012   K 3.9 05/05/2012   CL 102 05/05/2012   CREATININE 1.00 05/05/2012   BUN 13 05/05/2012   CO2 18* 05/05/2012   INR 1.38 05/03/2012   HGBA1C 6.6* 05/03/2012    ASSESSMENT AND PLAN:   1. History of hypertension: now hypotensive due to Diltiazem given for Afib.  I contacted Dr. Fabio Bering office to adjust her Diltiazem as appropriate.  The triage  nurse had graciously agreed to contact pt's nursing home for direction.   2. Hyperlipidemia: She is on rosuvastatin per PCP.   3. Acute renal failure due to hypercalcemia and suspected multiple myeloma: resolved with IV fluid. Her Cr is now normal.   4. Hypercalcemia: Due to suspected multiple myeloma: resolved with IVF and pamidronate during admission early July 2013.  Her Ca is now normal.   5. Moderate deconditioning due to bone pain: she is now in SNF.  I strongly urged her to adhere to rehab activities.   6. Lytic bone lesions causing bone pain: From suspected multiple myeloma.  Her pain is still refractory to pain meds.  I personally discussed with Dr. Mitzi Hansen today to see if he can start palliative XRT sooner than next week since I'm putting her chemo Velcade and Revlimid on hold for now.   7. Normocytic anemia: work up in the recent past did not show iron/VitB12 deficiency or hypothyroidism. Her anemia is due to myeloma and chemo. There is no active bleeding. There is no indication for transfusion.   8. Acute subdural hematoma from recent fall during hospital stay in July 2013: Resolved. She is on Prednisone taper.   9. Nausea: Due to chemo.  She has Zofran prn.   10. Moderate calorie/protein malnutrition: From myeloma, and its treatment. I advised her to take Zofran to allow her to tolerate PO intake better.   11. Multiple myeloma:  * Treatment:  - chemo Velcade SQ and oral Revlmid on hold for now until she is finished with palliative radiation to avoid cytopenia.  She is to continue with oral Dexamethasone 40mg  PO once a week.  - Continue Acyclovir 200 mg, by mouth, once daily, everyday (to prevent viral infection).  -  Continue Bactrim double strength (DS), every Monday, Wednesday, Friday (to prevent bacterial infection).  -  Continue Lovenox injection 40mg  , subcutaneously; everyday (to prevent blood clot).    12. Follow up:  On 05/19/2012.     The length of time of the  face-to-face encounter was 25  minutes. More than 50% of time was spent counseling and coordination of care.

## 2012-05-05 NOTE — Telephone Encounter (Signed)
Call from Nurse at [facility],  To clarify Decadron.  Instructed pt to continue Decadron once weekly on Tuesdays per Dr. Gaylyn Rong.  Confirmed to stop Revlimid.  She verbalized understanding.

## 2012-05-05 NOTE — Telephone Encounter (Signed)
Called husband to notify of pt moved to Dr. Lodema Pilot schedule today due to N.P.  Being out.  Instructed to check in for labs and then Dr. Gaylyn Rong after her Radiation appt/ sim today.  Informed it will probably be after 12 before Dr. Gaylyn Rong is able to see pt..  Instructed to bring all pt's meds w/ him.  He says he is at nursing home right now picking up pt for appt.  Says he left her meds at home.  Asked him to request a list from the nursing home.  He verbalized understanding.

## 2012-05-05 NOTE — Telephone Encounter (Signed)
Gave patient appointment for 05-19-2012 length the appointment for 05-19-2012 for 45 minutes

## 2012-05-05 NOTE — Progress Notes (Signed)
  Radiation Oncology         ([phone]) [phone] ________________________________  Name: [name] MRN: [id number]  Date: 05/05/2012  DOB: 1936/01/14  SIMULATION AND TREATMENT PLANNING NOTE  DIAGNOSIS:  Multiple myeloma  NARRATIVE:  The patient was brought to the CT Simulation planning suite.  Identity was confirmed.  All relevant records and images related to the planned course of therapy were reviewed.   Written consent to proceed with treatment was confirmed which was freely given after reviewing the details related to the planned course of therapy had been reviewed with the patient.  Then, the patient was set-up in a stable reproducible  supine position for radiation therapy.  a customized alpha cradle was constructed to aid in patient immobilization during her course of treatment. This complex treatment device will be used for each fraction. CT images were obtained.  Surface markings were placed.    The CT images were loaded into the planning software.  Then the target and avoidance structures were contoured.  Treatment planning then occurred.  The radiation prescription was entered and confirmed.  A total of 2 complex treatment devices were fabricated which relate to the designed radiation treatment fields. Each of these customized fields/ complex treatment devices will be used on a daily basis during the radiation course. I have requested : Isodose Plan.   PLAN:  The patient will receive 20 Gy in 8 fractions.  ________________________________   Radene Gunning, MD, PhD

## 2012-05-05 NOTE — Patient Instructions (Addendum)
1.  Diagnosis:  Multiple myeloma. 2.  Treatment plan:  I will stop chemo Velcade SQ and Revlimid pill for now for patient to undergo radiation therapy for the pain.  Once her radiation is finished, I will resume chemo.  Continue all other medications.   3.  For pain:  Continue Fentanyl patch for long acting.  Consolidate short acting pain med to only Vidcodin 5/500mg  PO q6 hr prn break through.  Right now, she has too many short acting pain meds, it's confusing and dangerous.  3.  Please contact Dr. Fabio Bering office ([facility]) [phone]) to get a new dose of Diltiazem due to her low blood pressure.  4.  Follow up with Medical Oncology on 05/19/2012 to tentatively restart the next cycle of chemo.

## 2012-05-05 NOTE — Progress Notes (Signed)
Met with patient to discuss RO billing.  Dx: 203.00 Multiple myeloma without mention of remission.   Attending Rad: Dr. Sanjuana Letters Tx: [id number] Extrl Beam

## 2012-05-06 ENCOUNTER — Telehealth: Payer: Self-pay | Admitting: *Deleted

## 2012-05-06 NOTE — Telephone Encounter (Signed)
Pt's husband asks if it is ok for [facility] to give pt Pneumonia Shot?  It has been over 5 yrs since pt had one.

## 2012-05-06 NOTE — Telephone Encounter (Signed)
Called husband back and informed ok per Dr. Gaylyn Rong for pt to get pneumonia shot.  He verbalized understanding.

## 2012-05-06 NOTE — Telephone Encounter (Signed)
Sure, no problem.   Thanks

## 2012-05-09 ENCOUNTER — Encounter: Payer: Self-pay | Admitting: Radiation Oncology

## 2012-05-09 ENCOUNTER — Ambulatory Visit
Admission: RE | Admit: 2012-05-09 | Discharge: 2012-05-09 | Disposition: A | Discharge status: Home / Self Care | Payer: Medicare Other | Source: Ambulatory Visit | Attending: Radiation Oncology | Admitting: Radiation Oncology

## 2012-05-09 DIAGNOSIS — C801 Malignant (primary) neoplasm, unspecified: Secondary | ICD-10-CM

## 2012-05-09 NOTE — Progress Notes (Signed)
  Radiation Oncology         [phone] ________________________________  Name: [name] MRN: [id number]  Date: 05/09/2012  DOB: [birth year]  Simulation Verification Note  Status: outpatient  NARRATIVE: The patient was brought to the treatment unit and placed in the planned treatment position. The clinical setup was verified. Then port films were obtained and uploaded to the radiation oncology medical record software.  The treatment beams were carefully compared against the planned radiation fields. The position location and shape of the radiation fields was reviewed. They targeted volume of tissue appears to be appropriately covered by the radiation beams. Organs at risk appear to be excluded as planned.  Based on my personal review, I approved the simulation verification. The patient's treatment will proceed as planned to her hip.  ------------------------------------------------  Artist Pais. Kathrynn Running, M.D.

## 2012-05-10 ENCOUNTER — Ambulatory Visit
Admission: RE | Admit: 2012-05-10 | Discharge: 2012-05-10 | Disposition: A | Discharge status: Home / Self Care | Payer: Medicare Other | Source: Ambulatory Visit | Attending: Radiation Oncology | Admitting: Radiation Oncology

## 2012-05-10 ENCOUNTER — Ambulatory Visit (INDEPENDENT_AMBULATORY_CARE_PROVIDER_SITE_OTHER): Payer: Medicare Other | Admitting: Family Medicine

## 2012-05-10 ENCOUNTER — Ambulatory Visit: Payer: Medicare Other | Admitting: Family Medicine

## 2012-05-10 ENCOUNTER — Encounter: Payer: Self-pay | Admitting: Family Medicine

## 2012-05-10 VITALS — BP 135/81 | HR 84 | Temp 97.7°F | Wt 173.0 lb

## 2012-05-10 DIAGNOSIS — C9000 Multiple myeloma not having achieved remission: Secondary | ICD-10-CM

## 2012-05-10 DIAGNOSIS — I10 Essential (primary) hypertension: Secondary | ICD-10-CM

## 2012-05-10 DIAGNOSIS — N63 Unspecified lump in unspecified breast: Secondary | ICD-10-CM

## 2012-05-10 NOTE — Progress Notes (Signed)
  Subjective:    Patient ID: [name], female    DOB: 1935-12-18, 76 y.o.   MRN: [id number]  HPI Recently dx swtih multiple myeloma.  Says he mood is up and down. Still waking some but still having a lot of pain on her low back and right hip.  She is on pain medication and pain patches.she does feel this helps. She has had 3 radiation tx and some chemotherapy.  Went to ED for a fall last week.  Bp was a little low.  She's currently staying in a skilled nursing facility. Her husband is here with her today and he is very supportive.   Review of Systems     Objective:   Physical Exam  Constitutional: She is oriented to person, place, and time. She appears well-developed and well-nourished.  HENT:  Head: Normocephalic and atraumatic.  Cardiovascular: Normal rate, regular rhythm and normal heart sounds.   Pulmonary/Chest: Effort normal and breath sounds normal.  Musculoskeletal: She exhibits edema.       Trace ankle edema bilaterally  Neurological: She is alert and oriented to person, place, and time.  Skin: Skin is warm and dry.  Psychiatric: She has a normal mood and affect. Her behavior is normal.   Note, she is not in A. fib today.       Assessment & Plan:  Multiple myeloma-she is currently getting care from hematology oncology. Her pain seems to be well-controlled at least over the last few days. She did want to know of her pneumonia vaccine is up-to-date and I gave her a printout of her last vaccination. It is up to date.  I offered her support in any way that we can. She is just a call if she needs anything.  Hypertension-well-controlled today. No chest pain or short of breath. Ankle edema is stable.  Encouraged her to get flu shot this fall, when rec by Oncology.    Breast mass-she's due for a repeat mammogram in September. She and her husband will call when he gets a little closer to see how well she is doing and we can schedule then.

## 2012-05-11 ENCOUNTER — Ambulatory Visit: Payer: Medicare Other

## 2012-05-11 ENCOUNTER — Ambulatory Visit
Admission: RE | Admit: 2012-05-11 | Discharge: 2012-05-11 | Disposition: A | Discharge status: Home / Self Care | Payer: Medicare Other | Source: Ambulatory Visit | Attending: Radiation Oncology | Admitting: Radiation Oncology

## 2012-05-12 ENCOUNTER — Ambulatory Visit
Admission: RE | Admit: 2012-05-12 | Discharge: 2012-05-12 | Disposition: A | Discharge status: Home / Self Care | Payer: Medicare Other | Source: Ambulatory Visit | Attending: Radiation Oncology | Admitting: Radiation Oncology

## 2012-05-12 ENCOUNTER — Ambulatory Visit: Payer: Medicare Other

## 2012-05-13 ENCOUNTER — Encounter: Payer: Self-pay | Admitting: Radiation Oncology

## 2012-05-13 ENCOUNTER — Ambulatory Visit
Admission: RE | Admit: 2012-05-13 | Discharge: 2012-05-13 | Disposition: A | Discharge status: Home / Self Care | Payer: Medicare Other | Source: Ambulatory Visit | Attending: Radiation Oncology | Admitting: Radiation Oncology

## 2012-05-13 ENCOUNTER — Other Ambulatory Visit: Payer: Self-pay | Admitting: *Deleted

## 2012-05-13 ENCOUNTER — Ambulatory Visit: Payer: Medicare Other

## 2012-05-13 VITALS — BP 134/83 | HR 93 | Temp 98.4°F | Resp 20 | Wt 168.9 lb

## 2012-05-13 DIAGNOSIS — C801 Malignant (primary) neoplasm, unspecified: Secondary | ICD-10-CM

## 2012-05-13 NOTE — Progress Notes (Signed)
  Radiation Oncology         [phone] ________________________________  Name: COLEEN CARDIFF MRN: [id number]  Date: 05/13/2012  DOB: 1936-09-16  Weekly Radiation Therapy Management  Current Dose: 12.5 Gy     Planned Dose:  20 Gy  Narrative . . . . . . . . The patient presents for routine under treatment assessment.                                              The patient is without new complaint. She continues to suffer with hip pain especially with activities.                                 Set-up films were reviewed.                                 The chart was checked. Physical Findings. . . she is in no acute distress today. She is alert and oriented.  Filed Vitals:   05/13/12 1421  BP: 134/83  Pulse: 93  Temp: 98.4 F (36.9 C)  Resp: 20  Weight essentially stable.  No significant changes. Impression . . . . . . . The patient is  tolerating radiation. Plan . . . . . . . . . . . . Continue treatment as planned.  ________________________________  Artist Pais. Kathrynn Running, M.D.

## 2012-05-13 NOTE — Telephone Encounter (Signed)
Request for Revlimid received from [facility]. 15mg  d1-21  Started 04/21/12.  Dr. Lodema Pilot note from 05-05-12 states Revlimid on hold till patient finishes radiation treatment. (05-18-12 last RT scheduled).  Pt. Sees Dr. Gaylyn Rong on 05/19/12.  Will give form to Raliegh Ip RN for Dr. Gaylyn Rong

## 2012-05-13 NOTE — Progress Notes (Signed)
Patient brought to nursing via w/c, resides at [facility] facility at present, completed 5/8 rad txs right hip, patient alert,oriented x3, c/o mild hip pain, , says she is eating well and drinking well, had shrimp, corn dogs, baked potato , cheese cake and iced tea for lunch, offered ginger ale, patient thirsty,  Fatigued but getting better with walking stated patient, post sim teaching done

## 2012-05-16 ENCOUNTER — Encounter: Payer: Self-pay | Admitting: Cardiovascular Disease

## 2012-05-16 ENCOUNTER — Ambulatory Visit: Payer: Medicare Other

## 2012-05-16 ENCOUNTER — Ambulatory Visit (INDEPENDENT_AMBULATORY_CARE_PROVIDER_SITE_OTHER): Payer: Medicare Other | Admitting: Cardiovascular Disease

## 2012-05-16 ENCOUNTER — Ambulatory Visit
Admission: RE | Admit: 2012-05-16 | Discharge: 2012-05-16 | Disposition: A | Discharge status: Home / Self Care | Payer: Medicare Other | Source: Ambulatory Visit | Attending: Radiation Oncology | Admitting: Radiation Oncology

## 2012-05-16 ENCOUNTER — Other Ambulatory Visit (HOSPITAL_BASED_OUTPATIENT_CLINIC_OR_DEPARTMENT_OTHER): Payer: Medicare Other

## 2012-05-16 VITALS — BP 132/74 | HR 74 | Wt 165.0 lb

## 2012-05-16 DIAGNOSIS — C9000 Multiple myeloma not having achieved remission: Secondary | ICD-10-CM

## 2012-05-16 DIAGNOSIS — I10 Essential (primary) hypertension: Secondary | ICD-10-CM

## 2012-05-16 DIAGNOSIS — I4891 Unspecified atrial fibrillation: Secondary | ICD-10-CM

## 2012-05-16 DIAGNOSIS — M25559 Pain in unspecified hip: Secondary | ICD-10-CM

## 2012-05-16 DIAGNOSIS — E785 Hyperlipidemia, unspecified: Secondary | ICD-10-CM

## 2012-05-16 LAB — COMPREHENSIVE METABOLIC PANEL
AST: 9 U/L (ref 0–37)
Albumin: 2.5 g/dL — ABNORMAL LOW (ref 3.5–5.2)
Alkaline Phosphatase: 183 U/L — ABNORMAL HIGH (ref 39–117)
BUN: 8 mg/dL (ref 6–23)
Potassium: 3.7 mEq/L (ref 3.5–5.3)
Sodium: 138 mEq/L (ref 135–145)
Total Bilirubin: 0.4 mg/dL (ref 0.3–1.2)

## 2012-05-16 LAB — CBC WITH DIFFERENTIAL/PLATELET
Basophils Absolute: 0.1 10*3/uL (ref 0.0–0.1)
EOS%: 2.8 % (ref 0.0–7.0)
MCH: 30.3 pg (ref 25.1–34.0)
MCV: 92.4 fL (ref 79.5–101.0)
MONO%: 17.7 % — ABNORMAL HIGH (ref 0.0–14.0)
RBC: 2.91 10*6/uL — ABNORMAL LOW (ref 3.70–5.45)
RDW: 16.4 % — ABNORMAL HIGH (ref 11.2–14.5)

## 2012-05-16 NOTE — Assessment & Plan Note (Signed)
Well controlled.  Continue current medications and low sodium Dash type diet.    

## 2012-05-16 NOTE — Assessment & Plan Note (Signed)
Continue Zometa and XRT Rx for bone pain  F/U oncology

## 2012-05-16 NOTE — Assessment & Plan Note (Signed)
Cholesterol is at goal.  Continue current dose of statin and diet Rx.  No myalgias or side effects.  F/U  LFT's in 6 months. Lab Results  Component Value Date   LDLCALC 170* 03/03/2012

## 2012-05-16 NOTE — Assessment & Plan Note (Signed)
Maint NSR  F/U echo for murmur and PAF

## 2012-05-16 NOTE — Progress Notes (Signed)
Patient ID: [name], female   DOB: Nov 09, 1935, 76 y.o.   MRN: [id number] 76 yo patient of Dr Daleen Squibb just seen in consult in hospital 05/03/12.  PAF.  Conveted to NSR with calcium blocker  Has anemia and myeloma. Right hip pain  Getting radiation Rx.  No anticoagulation because of this.  Supposed to get echo as outpatient but never arranged.  Since D/C staying at [facility].  NH.  She denies dyspnea, palpitations or chest pain.   Compliant with meds.  Activity limited by hip pain  ROS: Denies fever, malais, weight loss, blurry vision, decreased visual acuity, cough, sputum, SOB, hemoptysis, pleuritic pain, palpitaitons, heartburn, abdominal pain, melena, lower extremity edema, claudication, or rash.  All other systems reviewed and negative  General: Affect appropriate Chronically ill female HEENT: normal Neck supple with no adenopathy JVP normal no bruits no thyromegaly Lungs clear with no wheezing and good diaphragmatic motion Heart:  S1/S2 SEM murmur, no rub, gallop or click PMI normal Abdomen: benighn, BS positve, no tenderness, no AAA no bruit.  No HSM or HJR Distal pulses intact with no bruits No edema Neuro non-focal Skin warm and dry No muscular weakness   Current Outpatient Prescriptions  Medication Sig Dispense Refill  . acyclovir (ZOVIRAX) 400 MG tablet Take 400 mg by mouth daily.      Marland Kitchen ALPRAZolam (XANAX) 1 MG tablet Take 1 mg by mouth at bedtime as needed. Sleep.      . cyclobenzaprine (FLEXERIL) 5 MG tablet Take 5 mg by mouth 3 (three) times daily as needed.      Marland Kitchen dexamethasone (DECADRON) 4 MG tablet Take 4 mg by mouth every Tuesday. with chemotherapy drug      . diltiazem (CARDIZEM CD) 180 MG 24 hr capsule Take 1 capsule (180 mg total) by mouth daily.  30 capsule  2  . enoxaparin (LOVENOX) 40 MG/0.4ML injection Inject 0.4 mLs (40 mg total) into the skin daily.  30 Syringe  3  . fentaNYL (DURAGESIC - DOSED MCG/HR) 12 MCG/HR Place 1 patch (12.5 mcg total) onto the skin every  3 (three) days.  5 patch  0  . fentaNYL (DURAGESIC - DOSED MCG/HR) 25 MCG/HR Place 1 patch (25 mcg total) onto the skin every 3 (three) days.  5 patch  0  . HYDROcodone-acetaminophen (VICODIN) 5-500 MG per tablet Take 1 tablet by mouth every 6 (six) hours as needed.      . lidocaine (LIDODERM) 5 % Place 2 patches onto the skin daily. Remove & Discard patch within 12 hours or as directed by MD  60 patch  1  . ondansetron (ZOFRAN) 4 MG tablet Take 4 mg by mouth. Take 1 tablet every 8 hours as needed for nausea      . oxyCODONE (OXY IR/ROXICODONE) 5 MG immediate release tablet Take 5-10 mg by mouth every 4 (four) hours as needed. pain      . oxyCODONE-acetaminophen (PERCOCET) 7.5-325 MG per tablet Take by mouth. Take 1 tablet every 4-6 hours as needed.  7.5-325mg       . polyethylene glycol powder (GLYCOLAX/MIRALAX) powder Take 17 g by mouth 2 (two) times daily. Take as directed      . prochlorperazine (COMPAZINE) 10 MG tablet Take 10 mg by mouth. Take 1 tablet every 6 hours as directed      . QUEtiapine (SEROQUEL) 100 MG tablet Take 100 mg by mouth. Take 1 tablet daily      . rosuvastatin (CRESTOR) 10 MG tablet Take 10  mg by mouth at bedtime.       . sulfamethoxazole-trimethoprim (BACTRIM DS) 800-160 MG per tablet Take 1 tablet by mouth 3 (three) times a week.      . traMADol (ULTRAM) 50 MG tablet Take 25 mg by mouth at bedtime as needed. pain        Allergies  Simvastatin  Electrocardiogram:  Assessment and Plan

## 2012-05-16 NOTE — Patient Instructions (Addendum)
Your physician recommends that you schedule a follow-up appointment in: 3 MONTHS WITH DR WALL Your physician recommends that you continue on your current medications as directed. Please refer to the Current Medication list given to you today. Your physician has requested that you have an echocardiogram. Echocardiography is a painless test that uses sound waves to create images of your heart. It provides your doctor with information about the size and shape of your heart and how well your heart's chambers and valves are working. This procedure takes approximately one hour. There are no restrictions for this procedure. DX AFIB

## 2012-05-17 ENCOUNTER — Ambulatory Visit: Payer: Medicare Other

## 2012-05-17 ENCOUNTER — Telehealth: Payer: Self-pay | Admitting: *Deleted

## 2012-05-17 ENCOUNTER — Ambulatory Visit
Admission: RE | Admit: 2012-05-17 | Discharge: 2012-05-17 | Disposition: A | Discharge status: Home / Self Care | Payer: Medicare Other | Source: Ambulatory Visit | Attending: Radiation Oncology | Admitting: Radiation Oncology

## 2012-05-17 NOTE — Telephone Encounter (Signed)
[facility] faxed refill request for Revlimid.  Request to MD for review. 

## 2012-05-17 NOTE — Progress Notes (Signed)
Spoke to patient's husband who states that Revlimid was delivered, but patient has not began taking because she has been receiving radiation treatments; patient to see Dr. Gaylyn Rong 05/19/12; informed [facility] [phone]) that we will contact them when patient needs next refill.

## 2012-05-18 ENCOUNTER — Encounter: Payer: Self-pay | Admitting: Radiation Oncology

## 2012-05-18 ENCOUNTER — Ambulatory Visit
Admission: RE | Admit: 2012-05-18 | Discharge: 2012-05-18 | Disposition: A | Discharge status: Home / Self Care | Payer: Medicare Other | Source: Ambulatory Visit | Attending: Radiation Oncology | Admitting: Radiation Oncology

## 2012-05-18 ENCOUNTER — Ambulatory Visit (HOSPITAL_BASED_OUTPATIENT_CLINIC_OR_DEPARTMENT_OTHER): Payer: Medicare Other | Admitting: Radiology

## 2012-05-18 ENCOUNTER — Ambulatory Visit: Payer: Medicare Other

## 2012-05-18 VITALS — BP 123/77 | HR 71 | Temp 98.0°F | Resp 20

## 2012-05-18 DIAGNOSIS — I379 Nonrheumatic pulmonary valve disorder, unspecified: Secondary | ICD-10-CM | POA: Insufficient documentation

## 2012-05-18 DIAGNOSIS — C9000 Multiple myeloma not having achieved remission: Secondary | ICD-10-CM

## 2012-05-18 DIAGNOSIS — I4891 Unspecified atrial fibrillation: Secondary | ICD-10-CM

## 2012-05-18 DIAGNOSIS — I10 Essential (primary) hypertension: Secondary | ICD-10-CM | POA: Insufficient documentation

## 2012-05-18 DIAGNOSIS — I079 Rheumatic tricuspid valve disease, unspecified: Secondary | ICD-10-CM | POA: Insufficient documentation

## 2012-05-18 DIAGNOSIS — I080 Rheumatic disorders of both mitral and aortic valves: Secondary | ICD-10-CM | POA: Insufficient documentation

## 2012-05-18 LAB — PROTEIN ELECTROPHORESIS, SERUM
Alpha-1-Globulin: 8.6 % — ABNORMAL HIGH (ref 2.9–4.9)
Beta 2: 4.3 % (ref 3.2–6.5)
Beta Globulin: 16.9 % — ABNORMAL HIGH (ref 4.7–7.2)
Gamma Globulin: 9.6 % — ABNORMAL LOW (ref 11.1–18.8)

## 2012-05-18 NOTE — Progress Notes (Signed)
Patient completed tx 8/8 right hip, alert,oriented x3, patient sattes he believes her hip is doing better", has a sore on her bottom, is lying  Side to side,  In w/c,  Is walking better and works with physical therapy , still at [facility], 1 month f/u appt given 3:17 PM

## 2012-05-18 NOTE — Progress Notes (Signed)
Echocardiogram performed.  

## 2012-05-18 NOTE — Progress Notes (Signed)
Department of Radiation Oncology  Phone:  [phone] Fax:        [phone]  Weekly Treatment Note    Name: [name] Date: 05/18/2012 MRN: [id number] DOB: 08-Feb-1936   Current dose: 20 Gy  Current fraction: 8   MEDICATIONS: Current Outpatient Prescriptions  Medication Sig Dispense Refill  . acyclovir (ZOVIRAX) 400 MG tablet Take 400 mg by mouth daily.      Marland Kitchen ALPRAZolam (XANAX) 1 MG tablet Take 1 mg by mouth at bedtime as needed. Sleep.      . cyclobenzaprine (FLEXERIL) 5 MG tablet Take 5 mg by mouth 3 (three) times daily as needed.      Marland Kitchen dexamethasone (DECADRON) 4 MG tablet Take 4 mg by mouth every Tuesday. with chemotherapy drug      . diltiazem (CARDIZEM CD) 180 MG 24 hr capsule Take 1 capsule (180 mg total) by mouth daily.  30 capsule  2  . enoxaparin (LOVENOX) 40 MG/0.4ML injection Inject 0.4 mLs (40 mg total) into the skin daily.  30 Syringe  3  . fentaNYL (DURAGESIC - DOSED MCG/HR) 12 MCG/HR Place 1 patch (12.5 mcg total) onto the skin every 3 (three) days.  5 patch  0  . fentaNYL (DURAGESIC - DOSED MCG/HR) 25 MCG/HR Place 1 patch (25 mcg total) onto the skin every 3 (three) days.  5 patch  0  . HYDROcodone-acetaminophen (VICODIN) 5-500 MG per tablet Take 1 tablet by mouth every 6 (six) hours as needed.      . lidocaine (LIDODERM) 5 % Place 2 patches onto the skin daily. Remove & Discard patch within 12 hours or as directed by MD  60 patch  1  . ondansetron (ZOFRAN) 4 MG tablet Take 4 mg by mouth. Take 1 tablet every 8 hours as needed for nausea      . oxyCODONE (OXY IR/ROXICODONE) 5 MG immediate release tablet Take 5-10 mg by mouth every 4 (four) hours as needed. pain      . oxyCODONE-acetaminophen (PERCOCET) 7.5-325 MG per tablet Take by mouth. Take 1 tablet every 4-6 hours as needed.  7.5-325mg       . polyethylene glycol powder (GLYCOLAX/MIRALAX) powder Take 17 g by mouth 2 (two) times daily. Take as directed      . prochlorperazine (COMPAZINE) 10 MG tablet  Take 10 mg by mouth. Take 1 tablet every 6 hours as directed      . QUEtiapine (SEROQUEL) 100 MG tablet Take 100 mg by mouth. Take 1 tablet daily      . rosuvastatin (CRESTOR) 10 MG tablet Take 10 mg by mouth at bedtime.       . sulfamethoxazole-trimethoprim (BACTRIM DS) 800-160 MG per tablet Take 1 tablet by mouth 3 (three) times a week.      . traMADol (ULTRAM) 50 MG tablet Take 25 mg by mouth at bedtime as needed. pain         ALLERGIES: Simvastatin   LABORATORY DATA:  Lab Results  Component Value Date   WBC 5.6 05/16/2012   HGB 8.8* 05/16/2012   HCT 26.9* 05/16/2012   MCV 92.4 05/16/2012   PLT 267 05/16/2012   Lab Results  Component Value Date   NA 138 05/16/2012   K 3.7 05/16/2012   CL 100 05/16/2012   CO2 27 05/16/2012   Lab Results  Component Value Date   ALT 12 05/16/2012   AST 9 05/16/2012   ALKPHOS 183* 05/16/2012   BILITOT 0.4 05/16/2012  NARRATIVE: [name] was seen today for weekly treatment management. The chart was checked and the patient's films were reviewed. The patient is doing well. She finished her final fraction today. No nausea. No diarrhea. Her pain is improved in the right hip.  PHYSICAL EXAMINATION: temperature is 98 F (36.7 C). Her blood pressure is 123/77 and her pulse is 71. Her respiration is 20.      no significant skin change.  ASSESSMENT: The patient is doing satisfactorily with treatment. Completed today.  PLAN: Followup in one month.

## 2012-05-19 ENCOUNTER — Telehealth: Payer: Self-pay | Admitting: Oncology

## 2012-05-19 ENCOUNTER — Ambulatory Visit (HOSPITAL_BASED_OUTPATIENT_CLINIC_OR_DEPARTMENT_OTHER): Payer: Medicare Other

## 2012-05-19 ENCOUNTER — Other Ambulatory Visit: Payer: Self-pay | Admitting: *Deleted

## 2012-05-19 ENCOUNTER — Ambulatory Visit (HOSPITAL_BASED_OUTPATIENT_CLINIC_OR_DEPARTMENT_OTHER): Payer: Medicare Other | Admitting: Oncology

## 2012-05-19 VITALS — BP 142/82 | HR 87 | Temp 97.8°F | Resp 20 | Ht 67.0 in | Wt 177.6 lb

## 2012-05-19 DIAGNOSIS — N179 Acute kidney failure, unspecified: Secondary | ICD-10-CM

## 2012-05-19 DIAGNOSIS — E440 Moderate protein-calorie malnutrition: Secondary | ICD-10-CM

## 2012-05-19 DIAGNOSIS — G4700 Insomnia, unspecified: Secondary | ICD-10-CM

## 2012-05-19 DIAGNOSIS — S065XAA Traumatic subdural hemorrhage with loss of consciousness status unknown, initial encounter: Secondary | ICD-10-CM

## 2012-05-19 DIAGNOSIS — C9000 Multiple myeloma not having achieved remission: Secondary | ICD-10-CM

## 2012-05-19 DIAGNOSIS — F411 Generalized anxiety disorder: Secondary | ICD-10-CM

## 2012-05-19 DIAGNOSIS — E669 Obesity, unspecified: Secondary | ICD-10-CM

## 2012-05-19 DIAGNOSIS — K5901 Slow transit constipation: Secondary | ICD-10-CM

## 2012-05-19 DIAGNOSIS — Z780 Asymptomatic menopausal state: Secondary | ICD-10-CM

## 2012-05-19 DIAGNOSIS — F329 Major depressive disorder, single episode, unspecified: Secondary | ICD-10-CM

## 2012-05-19 DIAGNOSIS — M545 Low back pain, unspecified: Secondary | ICD-10-CM

## 2012-05-19 DIAGNOSIS — D649 Anemia, unspecified: Secondary | ICD-10-CM

## 2012-05-19 DIAGNOSIS — S065X9A Traumatic subdural hemorrhage with loss of consciousness of unspecified duration, initial encounter: Secondary | ICD-10-CM

## 2012-05-19 DIAGNOSIS — Z5112 Encounter for antineoplastic immunotherapy: Secondary | ICD-10-CM

## 2012-05-19 DIAGNOSIS — N63 Unspecified lump in unspecified breast: Secondary | ICD-10-CM

## 2012-05-19 DIAGNOSIS — I10 Essential (primary) hypertension: Secondary | ICD-10-CM

## 2012-05-19 DIAGNOSIS — R7989 Other specified abnormal findings of blood chemistry: Secondary | ICD-10-CM

## 2012-05-19 DIAGNOSIS — M899 Disorder of bone, unspecified: Secondary | ICD-10-CM

## 2012-05-19 DIAGNOSIS — E785 Hyperlipidemia, unspecified: Secondary | ICD-10-CM

## 2012-05-19 DIAGNOSIS — C9030 Solitary plasmacytoma not having achieved remission: Secondary | ICD-10-CM

## 2012-05-19 DIAGNOSIS — E860 Dehydration: Secondary | ICD-10-CM

## 2012-05-19 DIAGNOSIS — M898X9 Other specified disorders of bone, unspecified site: Secondary | ICD-10-CM

## 2012-05-19 DIAGNOSIS — M25559 Pain in unspecified hip: Secondary | ICD-10-CM

## 2012-05-19 DIAGNOSIS — F3289 Other specified depressive episodes: Secondary | ICD-10-CM

## 2012-05-19 DIAGNOSIS — R5381 Other malaise: Secondary | ICD-10-CM

## 2012-05-19 MED ORDER — ZOLEDRONIC ACID 4 MG/100ML IV SOLN
4.0000 mg | Freq: Once | INTRAVENOUS | Status: AC
  Administered 2012-05-19: 4 mg via INTRAVENOUS
  Filled 2012-05-19: qty 100

## 2012-05-19 MED ORDER — ONDANSETRON HCL 8 MG PO TABS
8.0000 mg | ORAL_TABLET | Freq: Once | ORAL | Status: AC
  Administered 2012-05-19: 8 mg via ORAL

## 2012-05-19 MED ORDER — LENALIDOMIDE 15 MG PO CAPS: 15.0000 mg | ORAL_CAPSULE | Freq: Every day | ORAL | Status: DC

## 2012-05-19 MED ORDER — BORTEZOMIB CHEMO SQ INJECTION 3.5 MG (2.5MG/ML)
1.3000 mg/m2 | Freq: Once | INTRAMUSCULAR | Status: AC
  Administered 2012-05-19: 2.5 mg via SUBCUTANEOUS
  Filled 2012-05-19: qty 2.5

## 2012-05-19 MED ORDER — SODIUM CHLORIDE 0.9 % IV SOLN
Freq: Once | INTRAVENOUS | Status: AC
  Administered 2012-05-19: 12:00:00 via INTRAVENOUS

## 2012-05-19 NOTE — Patient Instructions (Addendum)
1. Diagnosis: Multiple myeloma. 2. Status: After one cycle of chemotherapy with very good response.  Monoclonal M-spike decreased from 4.06 g/dL down to 0.98 g/dL. 3. Recommendation: continue with chemotherapy with once a week subcutaneous Velcade with 3 weeks on one week off; daily oral Revlimid on days 1 through 21; once a week dexamethasone. 4. Preventive medications:  - Continue Acyclovir 200 mg, by mouth, once daily, everyday (to prevent viral infection).  - Continue Bactrim double strength (DS), every Monday, Wednesday, Friday (to prevent bacterial infection).  - Continue Lovenox injection 40mg  , subcutaneously; everyday (to prevent blood clot).

## 2012-05-19 NOTE — Progress Notes (Signed)
[facility]  Telephone:([phone]) [phone] [phone]   OFFICE PROGRESS NOTE   Cc:  METHENEY,CATHERINE, MD   DIAGNOSIS: IgA kappa multiple myeloma. She presented with diffuse lytic bone lesions, renal insufficiency, hypercalcemia, anemia. Her M-spike was 4.06 gm/dL. Her IgA was elevated at 4,020 mg/dL; IgG 353; IgM 5; serum kappa was 2.11; lambda 0.60; serum kappa to lambda ratio elevated at 3.06. Spot urine showed elevated kappa:lambda at 579; and positive for Bence Jones protein. Bone marrow biopsy on 04/08/2012 showed 92% plasma cell. Cytogenetics showed multiple chromosome 1 abnormalities; additional chromosomal material on chromosome 8p; 14q; loss of chromosome 8 and 13. 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   CURRENT THERAPY: Started on SQ Velcade 1.3mg /m2 on 04/12/2012; weekly; 3 weeks on; 1 week off. She started on Revlimid 15mg  PO daily d1-21 on 04/21/2012. She is also on Dexamethasone 40mg  PO once weekly.  She also received palliative radiation 20 Gy in 8 fractions to the bilateral hips.    INTERVAL HISTORY: [name] 76 y.o. female returns for regular follow up with her husband and son.  She had very good response with palliative radiation to the hips.  Her pain is now only 5/10; worsening with prolonged ambulation.  Per her relatives, she is participating more in PT/OT at [facility].  She can walk longer distance now than 2 weeks ago with less pain.  She has mild fatigue; however, this is better than before.  Her appetite has also improved; and she is slowly recovering her lost weight from earlier this year.   Patient denies fever, anorexia, headache, visual changes, confusion, drenching night sweats, palpable lymph node swelling, mucositis, odynophagia, dysphagia, nausea vomiting, jaundice, chest pain, palpitation, shortness of breath, dyspnea on exertion, productive cough, gum bleeding, epistaxis, hematemesis, hemoptysis, abdominal pain, abdominal swelling,  early satiety, melena, hematochezia, hematuria, skin rash, spontaneous bleeding,  heat or cold intolerance, bowel bladder incontinence, focal motor weakness, paresthesia, depression, suicidal or homicidal ideation, feeling hopelessness.   Past Medical History  Diagnosis Date  . Anxiety   . Hypertension   . Hyperlipidemia   . Depression   . Osteopenia   . Multiple myeloma 03/2012  . Acute renal failure      due to hypercalcemia and suspected multiple myeloma resolved =with IVF's    . Lytic bone lesions on xray   . Acute subdural hematoma 03/2012     recent fall in hospital stay,resolved+  . Normocytic anemia     due to chemo and multiple myeloma,no active bleding  . Cancer     mul;tiple myeloma, bone lesions r 7th rib,   . Arthritis     spine    Past Surgical History  Procedure Date  . Breast lumpectomy   . 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 Tonsillectomy     age 58    Current Outpatient Prescriptions  Medication Sig Dispense Refill  . acetaminophen (TYLENOL) 325 MG tablet Take 650 mg by mouth every 4 (four) hours as needed.      Marland Kitchen acyclovir (ZOVIRAX) 400 MG tablet Take 400 mg by mouth daily.      Marland Kitchen ALPRAZolam (XANAX) 1 MG tablet Take 1 mg by mouth at bedtime as needed. Sleep.      . cyclobenzaprine (FLEXERIL) 5 MG tablet Take 5 mg by mouth 3 (three) times daily as needed.      Marland Kitchen dexamethasone (DECADRON) 4 MG tablet  Take 40 mg by mouth every Tuesday. with chemotherapy drug      . diltiazem (CARDIZEM CD) 180 MG 24 hr capsule Take 1 capsule (180 mg total) by mouth daily.  30 capsule  2  . enoxaparin (LOVENOX) 40 MG/0.4ML injection Inject 0.4 mLs (40 mg total) into the skin daily.  30 Syringe  3  . fentaNYL (DURAGESIC - DOSED MCG/HR) 12 MCG/HR Place 1 patch (12.5 mcg total) onto the skin every 3 (three) days.  5 patch  0  . fentaNYL (DURAGESIC - DOSED MCG/HR) 25 MCG/HR Place 1 patch  (25 mcg total) onto the skin every 3 (three) days.  5 patch  0  . gabapentin (NEURONTIN) 300 MG capsule Take 300 mg by mouth 2 (two) times daily.      Marland Kitchen HYDROcodone-acetaminophen (VICODIN) 5-500 MG per tablet Take 1 tablet by mouth every 6 (six) hours as needed.      . lidocaine (LIDODERM) 5 % Place 2 patches onto the skin daily. Remove & Discard patch within 12 hours or as directed by MD  60 patch  1  . ondansetron (ZOFRAN) 4 MG tablet Take 4 mg by mouth. Take 1 tablet every 8 hours as needed for nausea      . polyethylene glycol powder (GLYCOLAX/MIRALAX) powder Take 17 g by mouth 2 (two) times daily. Take as directed      . prochlorperazine (COMPAZINE) 10 MG tablet Take 10 mg by mouth. Take 1 tablet every 6 hours as directed      . QUEtiapine Fumarate (SEROQUEL PO) Take 75 mg by mouth at bedtime.      . rosuvastatin (CRESTOR) 10 MG tablet Take 10 mg by mouth at bedtime.       . sennosides-docusate sodium (SENOKOT-S) 8.6-50 MG tablet Take 1 tablet by mouth daily.      Marland Kitchen sulfamethoxazole-trimethoprim (BACTRIM DS) 800-160 MG per tablet Take 1 tablet by mouth 3 (three) times a week.      . traMADol (ULTRAM) 50 MG tablet Take 25 mg by mouth at bedtime as needed. pain      . calcium-vitamin D (OSCAL WITH D) 500-200 MG-UNIT per tablet Take 1 tablet by mouth 2 (two) times daily.      Marland Kitchen lenalidomide (REVLIMID) 15 MG capsule Take 1 capsule (15 mg total) by mouth daily.  15 capsule  0  . oxyCODONE (OXY IR/ROXICODONE) 5 MG immediate release tablet Take 5-10 mg by mouth every 4 (four) hours as needed. pain      . oxyCODONE-acetaminophen (PERCOCET) 7.5-325 MG per tablet Take by mouth. Take 1 tablet every 4-6 hours as needed.  7.5-325mg        No current facility-administered medications for this visit.   Facility-Administered Medications Ordered in Other Visits  Medication Dose Route Frequency Provider Last Rate Last Dose  . 0.9 %  sodium chloride infusion   Intravenous Once Exie Parody, MD 20 mL/hr at  05/19/12 1147    . bortezomib SQ (VELCADE) chemo injection 2.5 mg  1.3 mg/m2 (Treatment Plan Actual) Subcutaneous Once Exie Parody, MD   2.5 mg at 05/19/12 1206  . ondansetron (ZOFRAN) tablet 8 mg  8 mg Oral Once Exie Parody, MD   8 mg at 05/19/12 1118  . Zoledronic Acid (ZOMETA) 4 mg IVPB  4 mg Intravenous Once Exie Parody, MD 400 mL/hr at 05/19/12 1156 4 mg at 05/19/12 1156    ALLERGIES:  is allergic to simvastatin.  REVIEW OF SYSTEMS:  The rest  of the 14-point review of system was negative.   Filed Vitals:   05/19/12 1004  BP: 142/82  Pulse: 87  Temp: 97.8 F (36.6 C)  Resp: 20   Wt Readings from Last 3 Encounters:  05/19/12 177 lb 9.6 oz (80.559 kg)  05/16/12 165 lb (74.844 kg)  05/13/12 168 lb 14.4 oz (76.613 kg)   ECOG Performance status: 1-2  PHYSICAL EXAMINATION:   General: well-nourished woman in no acute distress. Eyes: no scleral icterus. ENT: There were no oropharyngeal lesions. Neck was without thyromegaly. Lymphatics: Negative cervical, supraclavicular or axillary adenopathy. Respiratory: lungs were clear bilaterally without wheezing or crackles. Cardiovascular: Regular rate and rhythm, S1/S2, without murmur, rub or gallop. There was no pedal edema. GI: abdomen was soft, flat, nontender, nondistended, without organomegaly. Muscoloskeletal: no spinal tenderness of palpation of vertebral spine. Skin exam was without echymosis, petichae. Neuro exam was nonfocal. Patient was able to get on and off exam table with some assistance.  Gait was measured. Patient was alerted and oriented. Attention was good. Language was appropriate. Mood was normal without depression. Speech was not pressured. Thought content was not tangential.    LABORATORY/RADIOLOGY DATA:  Lab Results  Component Value Date   WBC 5.6 05/16/2012   HGB 8.8* 05/16/2012   HCT 26.9* 05/16/2012   PLT 267 05/16/2012   GLUCOSE 111* 05/16/2012   CHOL 255* 03/03/2012   TRIG 117 03/03/2012   HDL 62 03/03/2012   LDLDIRECT 166*  04/24/2011   LDLCALC 170* 03/03/2012   ALKPHOS 183* 05/16/2012   ALT 12 05/16/2012   AST 9 05/16/2012   NA 138 05/16/2012   K 3.7 05/16/2012   CL 100 05/16/2012   CREATININE 0.85 05/16/2012   BUN 8 05/16/2012   CO2 27 05/16/2012   INR 1.38 05/03/2012   HGBA1C 6.6* 05/03/2012    ASSESSMENT AND PLAN:   1. Hypertension: She is on diltiazem per cardiology with good control.  2. Hyperlipidemia: She is on rosuvastatin per PCP.   3. Acute renal failure due to hypercalcemia and suspected multiple myeloma: resolved with IV fluid. Her Cr is now normal.   4. Hypercalcemia: Due to multiple myeloma.  Rresolved with IVF and pamidronate during admission early July 2013. Her Ca is now normal.   5. Moderate deconditioning due to bone pain: she is now in SNF and has been participating more.  I strongly urged her to adhere to rehab activities.   6. Lytic bone lesions causing bone pain: much improved with radiation.  I will continue with monthly Zometa.   7. Normocytic anemia: work up in the recent past did not show iron/VitB12 deficiency or hypothyroidism. Her anemia is due to myeloma and chemo. There is no active bleeding. There is no indication for transfusion.   8. Acute subdural hematoma from recent fall during hospital stay in July 2013: Resolved. She is finished with Prednisone taper.   9. Nausea: improved.  She has Compazine and Zofran prn.   10. Moderate calorie/protein malnutrition: From myeloma, and its treatment. This is also improved.   I advised her to take Zofran to allow her to tolerate PO intake better.   11. Multiple myeloma:  S/p 2 cycles of chemo with every good partial response with M-spike decreased from 4 gm/dL now to 0.7 gm/dL.  She does not have dose limiting toxicity.  She has finished radiation.  I recommended resuming chemo as before without dose modification.  Mrs. Deyo and her family expressed informed understanding and wished to proceed  as recommended.   * Treatment:  - Resume  chemo Velcade 1.3mg /m2 SQ once a week (3 weeks on, 1 week off) and oral Revlmid 15mg  PO days 1-21 (with 7 days off)  She is to continue with oral Dexamethasone 40mg  PO once a week.  - Continue Acyclovir 200 mg, by mouth, once daily, everyday (to prevent viral infection).  - Continue Bactrim double strength (DS), every Monday, Wednesday, Friday (to prevent bacterial infection).  - Continue Lovenox injection 40mg  , subcutaneously; everyday (to prevent blood clot).   12. Follow up: in one month.

## 2012-05-19 NOTE — Patient Instructions (Signed)
[facility] Discharge Instructions for Patients Receiving Chemotherapy  Today you received the following chemotherapy agent Velcade.  To help prevent nausea and vomiting after your treatment, we encourage you to take your nausea medication. Begin taking your nausea medication as often as prescribed for by Dr. Ha.    If you develop nausea and vomiting that is not controlled by your nausea medication, call the clinic. If it is after clinic hours your family physician or the after hours number for the clinic or go to the Emergency Department.   BELOW ARE SYMPTOMS THAT SHOULD BE REPORTED IMMEDIATELY:  *FEVER GREATER THAN 100.5 F  *CHILLS WITH OR WITHOUT FEVER  NAUSEA AND VOMITING THAT IS NOT CONTROLLED WITH YOUR NAUSEA MEDICATION  *UNUSUAL SHORTNESS OF BREATH  *UNUSUAL BRUISING OR BLEEDING  TENDERNESS IN MOUTH AND THROAT WITH OR WITHOUT PRESENCE OF ULCERS  *URINARY PROBLEMS  *BOWEL PROBLEMS  UNUSUAL RASH Items with * indicate a potential emergency and should be followed up as soon as possible.  One of the nurses will contact you 24 hours after your treatment. Please let the nurse know about any problems that you may have experienced. Feel free to call the clinic you have any questions or concerns. The clinic phone number is [phone].   I have been informed and understand all the instructions given to me. I know to contact the clinic, my physician, or go to the Emergency Department if any problems should occur. I do not have any questions at this time, but understand that I may call the clinic during office hours   should I have any questions or need assistance in obtaining follow up care.    __________________________________________  _____________  __________ Signature of Patient or Authorized Representative            Date                   Time    __________________________________________ Nurse's Signature      BORTEZOMIB (bor TEZ oh mib) is a  chemotherapy drug. It slows the growth of cancer cells. This medicine is used to treat multiple myeloma, lymphoma, and other cancers. This medicine may be used for other purposes; ask your health care provider or pharmacist if you have questions. What should I tell my health care provider before I take this medicine? They need to know if you have any of these conditions: -heart disease -irregular heartbeat -liver disease -low blood counts, like low white blood cells, platelets, or hemoglobin -peripheral neuropathy -taking medicine for blood pressure -an unusual or allergic reaction to bortezomib, mannitol, boron, other medicines, foods, dyes, or preservatives -pregnant or trying to get pregnant -breast-feeding How should I use this medicine? This medicine is for injection into a vein or for injection under the skin. It is given by a health care professional in a hospital or clinic setting. Talk to your pediatrician regarding the use of this medicine in children. Special care may be needed. Overdosage: If you think you have taken too much of this medicine contact a poison control center or emergency room at once. NOTE: This medicine is only for you. Do not share this medicine with others. What if I miss a dose? It is important not to miss your dose. Call your doctor or health care professional if you are unable to keep an appointment. What may interact with this medicine? -medicines for diabetes -medicines to increase blood counts like filgrastim, pegfilgrastim, sargramostim -zalcitabine Talk to your doctor   or health care professional before taking any of these medicines: -acetaminophen -aspirin -ibuprofen -ketoprofen -naproxen This list may not describe all possible interactions. Give your health care provider a list of all the medicines, herbs, non-prescription drugs, or dietary supplements you use. Also tell them if you smoke, drink alcohol, or use illegal drugs. Some items may  interact with your medicine. What should I watch for while using this medicine? Visit your doctor for checks on your progress. This drug may make you feel generally unwell. This is not uncommon, as chemotherapy can affect healthy cells as well as cancer cells. Report any side effects. Continue your course of treatment even though you feel ill unless your doctor tells you to stop. You may get drowsy or dizzy. Do not drive, use machinery, or do anything that needs mental alertness until you know how this medicine affects you. Do not stand or sit up quickly, especially if you are an older patient. This reduces the risk of dizzy or fainting spells. In some cases, you may be given additional medicines to help with side effects. Follow all directions for their use. Call your doctor or health care professional for advice if you get a fever, chills or sore throat, or other symptoms of a cold or flu. Do not treat yourself. This drug decreases your body's ability to fight infections. Try to avoid being around people who are sick. This medicine may increase your risk to bruise or bleed. Call your doctor or health care professional if you notice any unusual bleeding. Be careful brushing and flossing your teeth or using a toothpick because you may get an infection or bleed more easily. If you have any dental work done, tell your dentist you are receiving this medicine. Avoid taking products that contain aspirin, acetaminophen, ibuprofen, naproxen, or ketoprofen unless instructed by your doctor. These medicines may hide a fever. Do not become pregnant while taking this medicine. Women should inform their doctor if they wish to become pregnant or think they might be pregnant. There is a potential for serious side effects to an unborn child. Talk to your health care professional or pharmacist for more information. Do not breast-feed an infant while taking this medicine. You may have vomiting or diarrhea while taking this  medicine. Drink water or other fluids as directed. What side effects may I notice from receiving this medicine? Side effects that you should report to your doctor or health care professional as soon as possible: -allergic reactions like skin rash, itching or hives, swelling of the face, lips, or tongue -breathing problems -changes in hearing -changes in vision -fast, irregular heartbeat -feeling faint or lightheaded, falls -pain, tingling, numbness in the hands or feet -seizures -swelling of the ankles, feet, hands -unusual bleeding or bruising -unusually weak or tired -vomiting Side effects that usually do not require medical attention (report to your doctor or health care professional if they continue or are bothersome): -changes in emotions or moods -constipation -diarrhea -loss of appetite -headache -irritation at site where injected -nausea This list may not describe all possible side effects. Call your doctor for medical advice about side effects. You may report side effects to FDA at 1-800-FDA-1088. Where should I keep my medicine? This drug is given in a hospital or clinic and will not be stored at home. NOTE: This sheet is a summary. It may not cover all possible information. If you have questions about this medicine, talk to your doctor, pharmacist, or health care provider.  2012, Elsevier/Gold Standard. (  10/22/2010 11:42:36 AM)  ZOLEDRONIC ACID (ZOE le dron ik AS id) lowers the amount of calcium loss from bone. It is used to treat too much calcium in your blood from cancer. It is also used to prevent complications of cancer that has spread to the bone. This medicine may be used for other purposes; ask your health care provider or pharmacist if you have questions. What should I tell my health care provider before I take this medicine? They need to know if you have any of these conditions: -aspirin-sensitive asthma -dental disease -kidney disease -an unusual or allergic  reaction to zoledronic acid, other medicines, foods, dyes, or preservatives -pregnant or trying to get pregnant -breast-feeding How should I use this medicine? This medicine is for infusion into a vein. It is given by a health care professional in a hospital or clinic setting. Talk to your pediatrician regarding the use of this medicine in children. Special care may be needed. Overdosage: If you think you have taken too much of this medicine contact a poison control center or emergency room at once. NOTE: This medicine is only for you. Do not share this medicine with others. What if I miss a dose? It is important not to miss your dose. Call your doctor or health care professional if you are unable to keep an appointment. What may interact with this medicine? -certain antibiotics given by injection -NSAIDs, medicines for pain and inflammation, like ibuprofen or naproxen -some diuretics like bumetanide, furosemide -teriparatide -thalidomide This list may not describe all possible interactions. Give your health care provider a list of all the medicines, herbs, non-prescription drugs, or dietary supplements you use. Also tell them if you smoke, drink alcohol, or use illegal drugs. Some items may interact with your medicine. What should I watch for while using this medicine? Visit your doctor or health care professional for regular checkups. It may be some time before you see the benefit from this medicine. Do not stop taking your medicine unless your doctor tells you to. Your doctor may order blood tests or other tests to see how you are doing. Women should inform their doctor if they wish to become pregnant or think they might be pregnant. There is a potential for serious side effects to an unborn child. Talk to your health care professional or pharmacist for more information. You should make sure that you get enough calcium and vitamin D while you are taking this medicine. Discuss the foods you eat  and the vitamins you take with your health care professional. Some people who take this medicine have severe bone, joint, and/or muscle pain. This medicine may also increase your risk for a broken thigh bone. Tell your doctor right away if you have pain in your upper leg or groin. Tell your doctor if you have any pain that does not go away or that gets worse. What side effects may I notice from receiving this medicine? Side effects that you should report to your doctor or health care professional as soon as possible: -allergic reactions like skin rash, itching or hives, swelling of the face, lips, or tongue -anxiety, confusion, or depression -breathing problems -changes in vision -feeling faint or lightheaded, falls -jaw burning, cramping, pain -muscle cramps, stiffness, or weakness -trouble passing urine or change in the amount of urine Side effects that usually do not require medical attention (report to your doctor or health care professional if they continue or are bothersome): -bone, joint, or muscle pain -fever -hair loss -irritation  at site where injected -loss of appetite -nausea, vomiting -stomach upset -tired This list may not describe all possible side effects. Call your doctor for medical advice about side effects. You may report side effects to FDA at 1-800-FDA-1088. Where should I keep my medicine? This drug is given in a hospital or clinic and will not be stored at home. NOTE: This sheet is a summary. It may not cover all possible information. If you have questions about this medicine, talk to your doctor, pharmacist, or health care provider.  2012, Elsevier/Gold Standard. (03/13/2011 9:06:58 AM)

## 2012-05-19 NOTE — Telephone Encounter (Signed)
Faxed rx to [facility] for 15 tabs of revlimid as pt has 6 tabs left over to equal 21 day cycle.  Called [facility] and left VM for pharmacist clarifying the reason for 15 pills ordered instead of 21.

## 2012-05-19 NOTE — Telephone Encounter (Signed)
appts made and printed for pt aom °

## 2012-05-20 ENCOUNTER — Other Ambulatory Visit: Payer: Self-pay

## 2012-05-20 ENCOUNTER — Inpatient Hospital Stay (HOSPITAL_COMMUNITY)
Admission: EM | Admit: 2012-05-20 | Discharge: 2012-05-24 | DRG: 689 | Disposition: A | Discharge status: Skilled Nursing Facility | Payer: Medicare Other | Attending: Internal Medicine | Admitting: Internal Medicine

## 2012-05-20 ENCOUNTER — Encounter (HOSPITAL_COMMUNITY): Payer: Self-pay | Admitting: Emergency Medicine

## 2012-05-20 ENCOUNTER — Emergency Department (HOSPITAL_COMMUNITY): Payer: Medicare Other

## 2012-05-20 DIAGNOSIS — E785 Hyperlipidemia, unspecified: Secondary | ICD-10-CM | POA: Diagnosis present

## 2012-05-20 DIAGNOSIS — Z780 Asymptomatic menopausal state: Secondary | ICD-10-CM

## 2012-05-20 DIAGNOSIS — C9000 Multiple myeloma not having achieved remission: Secondary | ICD-10-CM | POA: Diagnosis present

## 2012-05-20 DIAGNOSIS — N390 Urinary tract infection, site not specified: Principal | ICD-10-CM | POA: Diagnosis present

## 2012-05-20 DIAGNOSIS — M899 Disorder of bone, unspecified: Secondary | ICD-10-CM

## 2012-05-20 DIAGNOSIS — D6959 Other secondary thrombocytopenia: Secondary | ICD-10-CM | POA: Diagnosis present

## 2012-05-20 DIAGNOSIS — R4182 Altered mental status, unspecified: Secondary | ICD-10-CM | POA: Diagnosis present

## 2012-05-20 DIAGNOSIS — T451X5A Adverse effect of antineoplastic and immunosuppressive drugs, initial encounter: Secondary | ICD-10-CM | POA: Diagnosis present

## 2012-05-20 DIAGNOSIS — E876 Hypokalemia: Secondary | ICD-10-CM | POA: Diagnosis present

## 2012-05-20 DIAGNOSIS — C9030 Solitary plasmacytoma not having achieved remission: Secondary | ICD-10-CM

## 2012-05-20 DIAGNOSIS — R7989 Other specified abnormal findings of blood chemistry: Secondary | ICD-10-CM

## 2012-05-20 DIAGNOSIS — M898X9 Other specified disorders of bone, unspecified site: Secondary | ICD-10-CM

## 2012-05-20 DIAGNOSIS — M949 Disorder of cartilage, unspecified: Secondary | ICD-10-CM

## 2012-05-20 DIAGNOSIS — R509 Fever, unspecified: Secondary | ICD-10-CM

## 2012-05-20 DIAGNOSIS — M25559 Pain in unspecified hip: Secondary | ICD-10-CM

## 2012-05-20 DIAGNOSIS — S065XAA Traumatic subdural hemorrhage with loss of consciousness status unknown, initial encounter: Secondary | ICD-10-CM

## 2012-05-20 DIAGNOSIS — I4891 Unspecified atrial fibrillation: Secondary | ICD-10-CM | POA: Diagnosis present

## 2012-05-20 DIAGNOSIS — E669 Obesity, unspecified: Secondary | ICD-10-CM

## 2012-05-20 DIAGNOSIS — I10 Essential (primary) hypertension: Secondary | ICD-10-CM | POA: Diagnosis present

## 2012-05-20 DIAGNOSIS — F329 Major depressive disorder, single episode, unspecified: Secondary | ICD-10-CM | POA: Diagnosis present

## 2012-05-20 DIAGNOSIS — C801 Malignant (primary) neoplasm, unspecified: Secondary | ICD-10-CM

## 2012-05-20 DIAGNOSIS — F411 Generalized anxiety disorder: Secondary | ICD-10-CM

## 2012-05-20 DIAGNOSIS — J189 Pneumonia, unspecified organism: Secondary | ICD-10-CM | POA: Diagnosis present

## 2012-05-20 DIAGNOSIS — Z79899 Other long term (current) drug therapy: Secondary | ICD-10-CM

## 2012-05-20 DIAGNOSIS — S065X9A Traumatic subdural hemorrhage with loss of consciousness of unspecified duration, initial encounter: Secondary | ICD-10-CM

## 2012-05-20 DIAGNOSIS — Z9221 Personal history of antineoplastic chemotherapy: Secondary | ICD-10-CM

## 2012-05-20 DIAGNOSIS — F3289 Other specified depressive episodes: Secondary | ICD-10-CM | POA: Diagnosis present

## 2012-05-20 DIAGNOSIS — G4700 Insomnia, unspecified: Secondary | ICD-10-CM

## 2012-05-20 DIAGNOSIS — K5901 Slow transit constipation: Secondary | ICD-10-CM

## 2012-05-20 DIAGNOSIS — D6481 Anemia due to antineoplastic chemotherapy: Secondary | ICD-10-CM | POA: Diagnosis present

## 2012-05-20 DIAGNOSIS — M545 Low back pain, unspecified: Secondary | ICD-10-CM

## 2012-05-20 DIAGNOSIS — D649 Anemia, unspecified: Secondary | ICD-10-CM

## 2012-05-20 DIAGNOSIS — N63 Unspecified lump in unspecified breast: Secondary | ICD-10-CM

## 2012-05-20 HISTORY — DX: Unspecified atrial fibrillation: I48.91

## 2012-05-20 LAB — CBC WITH DIFFERENTIAL/PLATELET
Basophils Absolute: 0 10*3/uL (ref 0.0–0.1)
Basophils Relative: 0 % (ref 0–1)
Eosinophils Relative: 2 % (ref 0–5)
HCT: 26.8 % — ABNORMAL LOW (ref 36.0–46.0)
Hemoglobin: 8.7 g/dL — ABNORMAL LOW (ref 12.0–15.0)
MCH: 30.4 pg (ref 26.0–34.0)
MCHC: 32.5 g/dL (ref 30.0–36.0)
MCV: 93.7 fL (ref 78.0–100.0)
Monocytes Absolute: 0.3 10*3/uL (ref 0.1–1.0)
Monocytes Relative: 4 % (ref 3–12)
RDW: 17.3 % — ABNORMAL HIGH (ref 11.5–15.5)

## 2012-05-20 LAB — COMPREHENSIVE METABOLIC PANEL
ALT: 10 U/L (ref 0–35)
AST: 9 U/L (ref 0–37)
Albumin: 2.2 g/dL — ABNORMAL LOW (ref 3.5–5.2)
Alkaline Phosphatase: 179 U/L — ABNORMAL HIGH (ref 39–117)
GFR calc Af Amer: 73 mL/min — ABNORMAL LOW (ref >90)
Glucose, Bld: 130 mg/dL — ABNORMAL HIGH (ref 70–99)
Potassium: 3.9 mEq/L (ref 3.5–5.1)
Sodium: 135 mEq/L (ref 135–145)
Total Protein: 5.2 g/dL — ABNORMAL LOW (ref 6.0–8.3)

## 2012-05-20 LAB — URINALYSIS, ROUTINE W REFLEX MICROSCOPIC
Bilirubin Urine: NEGATIVE
Glucose, UA: NEGATIVE mg/dL
Ketones, ur: NEGATIVE mg/dL
Nitrite: NEGATIVE
Protein, ur: NEGATIVE mg/dL

## 2012-05-20 LAB — URINE MICROSCOPIC-ADD ON

## 2012-05-20 MED ORDER — VANCOMYCIN HCL IN DEXTROSE 1-5 GM/200ML-% IV SOLN
1000.0000 mg | Freq: Once | INTRAVENOUS | Status: AC
  Administered 2012-05-21: 1000 mg via INTRAVENOUS
  Filled 2012-05-20: qty 200

## 2012-05-20 MED ORDER — VANCOMYCIN HCL 10 G IV SOLR: 1.0000 g | Freq: Once | INTRAVENOUS | Status: DC

## 2012-05-20 MED ORDER — ACETAMINOPHEN 325 MG PO TABS
650.0000 mg | ORAL_TABLET | Freq: Once | ORAL | Status: AC
  Administered 2012-05-21: 650 mg via ORAL
  Filled 2012-05-20: qty 2

## 2012-05-20 MED ORDER — PIPERACILLIN-TAZOBACTAM 3.375 G IVPB
3.3750 g | Freq: Once | INTRAVENOUS | Status: AC
  Administered 2012-05-21: 3.375 g via INTRAVENOUS
  Filled 2012-05-20: qty 50

## 2012-05-20 MED ORDER — SODIUM CHLORIDE 0.9 % IV SOLN
Freq: Once | INTRAVENOUS | Status: AC
  Administered 2012-05-21: via INTRAVENOUS

## 2012-05-20 NOTE — ED Notes (Signed)
[name] at bedside.

## 2012-05-20 NOTE — ED Notes (Signed)
Pt transported via EMS from [facility] with reports of fever of 102 today, pt given Tylenol 1000mg  @ 1840 today. A & O, warn to touch

## 2012-05-20 NOTE — ED Notes (Signed)
Pt sent from [facility] after spiking fever this afternoon. Pt started new IV chemo yesterday per husband at bedside, he states she has slept most of the day today. Pt A & O, denies pain. States she was given tylenol today @ 1830.

## 2012-05-20 NOTE — ED Notes (Signed)
Bed:WA16<BR> Expected date:<BR> Expected time:<BR> Means of arrival:<BR> Comments:<BR> EMS

## 2012-05-21 ENCOUNTER — Encounter (HOSPITAL_COMMUNITY): Payer: Self-pay | Admitting: Internal Medicine

## 2012-05-21 ENCOUNTER — Other Ambulatory Visit: Payer: Self-pay

## 2012-05-21 DIAGNOSIS — N390 Urinary tract infection, site not specified: Secondary | ICD-10-CM

## 2012-05-21 DIAGNOSIS — J189 Pneumonia, unspecified organism: Secondary | ICD-10-CM

## 2012-05-21 DIAGNOSIS — I4891 Unspecified atrial fibrillation: Secondary | ICD-10-CM

## 2012-05-21 DIAGNOSIS — R509 Fever, unspecified: Secondary | ICD-10-CM

## 2012-05-21 DIAGNOSIS — C9000 Multiple myeloma not having achieved remission: Secondary | ICD-10-CM

## 2012-05-21 LAB — BASIC METABOLIC PANEL
CO2: 25 mEq/L (ref 19–32)
Chloride: 101 mEq/L (ref 96–112)
GFR calc Af Amer: 74 mL/min — ABNORMAL LOW (ref >90)
Potassium: 3.8 mEq/L (ref 3.5–5.1)

## 2012-05-21 LAB — CBC
HCT: 24.1 % — ABNORMAL LOW (ref 36.0–46.0)
Hemoglobin: 7.6 g/dL — ABNORMAL LOW (ref 12.0–15.0)
MCV: 94.1 fL (ref 78.0–100.0)
WBC: 7.6 10*3/uL (ref 4.0–10.5)

## 2012-05-21 LAB — LACTIC ACID, PLASMA: Lactic Acid, Venous: 1.1 mmol/L (ref 0.5–2.2)

## 2012-05-21 LAB — MRSA PCR SCREENING: MRSA by PCR: NEGATIVE

## 2012-05-21 MED ORDER — SENNOSIDES-DOCUSATE SODIUM 8.6-50 MG PO TABS
1.0000 | ORAL_TABLET | Freq: Every day | ORAL | Status: DC
  Administered 2012-05-21 – 2012-05-24 (×4): 1 via ORAL
  Filled 2012-05-21 (×4): qty 1

## 2012-05-21 MED ORDER — ACETAMINOPHEN 325 MG PO TABS: 650.0000 mg | ORAL_TABLET | ORAL | Status: DC | PRN

## 2012-05-21 MED ORDER — VANCOMYCIN HCL 1000 MG IV SOLR
750.0000 mg | Freq: Two times a day (BID) | INTRAVENOUS | Status: DC
  Administered 2012-05-21 – 2012-05-22 (×3): 750 mg via INTRAVENOUS
  Filled 2012-05-21 (×4): qty 750

## 2012-05-21 MED ORDER — MORPHINE SULFATE 2 MG/ML IJ SOLN: 2.0000 mg | INTRAMUSCULAR | Status: DC | PRN

## 2012-05-21 MED ORDER — SODIUM CHLORIDE 0.9 % IJ SOLN
3.0000 mL | Freq: Two times a day (BID) | INTRAMUSCULAR | Status: DC
  Administered 2012-05-21 – 2012-05-23 (×5): 3 mL via INTRAVENOUS

## 2012-05-21 MED ORDER — ZOLPIDEM TARTRATE 5 MG PO TABS: 5.0000 mg | ORAL_TABLET | Freq: Every evening | ORAL | Status: DC | PRN

## 2012-05-21 MED ORDER — DEXTROSE-NACL 5-0.9 % IV SOLN
INTRAVENOUS | Status: DC
  Administered 2012-05-21 – 2012-05-23 (×3): via INTRAVENOUS

## 2012-05-21 MED ORDER — LENALIDOMIDE 15 MG PO CAPS
15.0000 mg | ORAL_CAPSULE | Freq: Every day | ORAL | Status: DC
  Administered 2012-05-21 – 2012-05-24 (×4): 15 mg via ORAL
  Filled 2012-05-21: qty 1

## 2012-05-21 MED ORDER — FENTANYL 25 MCG/HR TD PT72
25.0000 ug | MEDICATED_PATCH | TRANSDERMAL | Status: DC
  Administered 2012-05-21: 25 ug via TRANSDERMAL
  Filled 2012-05-21: qty 1

## 2012-05-21 MED ORDER — ATORVASTATIN CALCIUM 20 MG PO TABS
20.0000 mg | ORAL_TABLET | Freq: Every day | ORAL | Status: DC
  Administered 2012-05-22 – 2012-05-23 (×2): 20 mg via ORAL
  Filled 2012-05-21 (×4): qty 1

## 2012-05-21 MED ORDER — ONDANSETRON HCL 4 MG/2ML IJ SOLN: 4.0000 mg | Freq: Three times a day (TID) | INTRAMUSCULAR | Status: DC | PRN

## 2012-05-21 MED ORDER — ONDANSETRON HCL 4 MG/2ML IJ SOLN: 4.0000 mg | Freq: Four times a day (QID) | INTRAMUSCULAR | Status: DC | PRN

## 2012-05-21 MED ORDER — SENNA-DOCUSATE SODIUM 8.6-50 MG PO TABS: 1.0000 | ORAL_TABLET | Freq: Every day | ORAL | Status: DC

## 2012-05-21 MED ORDER — DOCUSATE SODIUM 100 MG PO CAPS
100.0000 mg | ORAL_CAPSULE | Freq: Two times a day (BID) | ORAL | Status: DC
  Administered 2012-05-21: 100 mg via ORAL
  Filled 2012-05-21 (×2): qty 1

## 2012-05-21 MED ORDER — HYDROCODONE-ACETAMINOPHEN 5-325 MG PO TABS: 1.0000 | ORAL_TABLET | Freq: Four times a day (QID) | ORAL | Status: DC | PRN

## 2012-05-21 MED ORDER — CYCLOBENZAPRINE HCL 5 MG PO TABS
5.0000 mg | ORAL_TABLET | Freq: Three times a day (TID) | ORAL | Status: DC | PRN
  Filled 2012-05-21: qty 1

## 2012-05-21 MED ORDER — POLYETHYLENE GLYCOL 3350 17 GM/SCOOP PO POWD
17.0000 g | Freq: Two times a day (BID) | ORAL | Status: DC
  Administered 2012-05-21 – 2012-05-23 (×4): 17 g via ORAL
  Filled 2012-05-21 (×4): qty 255

## 2012-05-21 MED ORDER — ENOXAPARIN SODIUM 40 MG/0.4ML ~~LOC~~ SOLN
40.0000 mg | SUBCUTANEOUS | Status: DC
  Administered 2012-05-21 – 2012-05-24 (×4): 40 mg via SUBCUTANEOUS
  Filled 2012-05-21 (×4): qty 0.4

## 2012-05-21 MED ORDER — ALPRAZOLAM 1 MG PO TABS: 1.0000 mg | ORAL_TABLET | Freq: Every evening | ORAL | Status: DC | PRN

## 2012-05-21 MED ORDER — ONDANSETRON HCL 4 MG PO TABS: 4.0000 mg | ORAL_TABLET | Freq: Four times a day (QID) | ORAL | Status: DC | PRN

## 2012-05-21 MED ORDER — SODIUM CHLORIDE 0.9 % IV SOLN
INTRAVENOUS | Status: AC
  Administered 2012-05-21: 03:00:00 via INTRAVENOUS

## 2012-05-21 MED ORDER — QUETIAPINE FUMARATE 100 MG PO TABS
100.0000 mg | ORAL_TABLET | Freq: Every day | ORAL | Status: DC
  Administered 2012-05-21: 100 mg via ORAL
  Filled 2012-05-21 (×2): qty 1

## 2012-05-21 MED ORDER — SODIUM CHLORIDE 0.9 % IV BOLUS (SEPSIS)
500.0000 mL | Freq: Once | INTRAVENOUS | Status: AC
  Administered 2012-05-21: 500 mL via INTRAVENOUS

## 2012-05-21 MED ORDER — QUETIAPINE FUMARATE 50 MG PO TABS
75.0000 mg | ORAL_TABLET | Freq: Every day | ORAL | Status: DC
  Administered 2012-05-21: 75 mg via ORAL
  Filled 2012-05-21 (×2): qty 1

## 2012-05-21 MED ORDER — PIPERACILLIN-TAZOBACTAM 3.375 G IVPB 30 MIN
3.3750 g | Freq: Three times a day (TID) | INTRAVENOUS | Status: DC
  Administered 2012-05-21 – 2012-05-23 (×7): 3.375 g via INTRAVENOUS
  Filled 2012-05-21 (×8): qty 50

## 2012-05-21 MED ORDER — ACYCLOVIR 400 MG PO TABS
400.0000 mg | ORAL_TABLET | Freq: Every day | ORAL | Status: DC
  Administered 2012-05-21 – 2012-05-24 (×4): 400 mg via ORAL
  Filled 2012-05-21 (×4): qty 1

## 2012-05-21 MED ORDER — GABAPENTIN 300 MG PO CAPS
300.0000 mg | ORAL_CAPSULE | Freq: Two times a day (BID) | ORAL | Status: DC
  Administered 2012-05-21 – 2012-05-24 (×7): 300 mg via ORAL
  Filled 2012-05-21 (×8): qty 1

## 2012-05-21 MED ORDER — SULFAMETHOXAZOLE-TMP DS 800-160 MG PO TABS: 1.0000 | ORAL_TABLET | ORAL | Status: DC

## 2012-05-21 MED ORDER — FENTANYL 12 MCG/HR TD PT72
12.5000 ug | MEDICATED_PATCH | TRANSDERMAL | Status: DC
  Administered 2012-05-21: 12.5 ug via TRANSDERMAL
  Filled 2012-05-21: qty 1

## 2012-05-21 MED ORDER — LENALIDOMIDE 15 MG PO CAPS
15.0000 mg | ORAL_CAPSULE | Freq: Every day | ORAL | Status: DC
  Filled 2012-05-21: qty 1

## 2012-05-21 MED ORDER — DILTIAZEM HCL ER COATED BEADS 180 MG PO CP24
180.0000 mg | ORAL_CAPSULE | Freq: Every day | ORAL | Status: DC
  Administered 2012-05-21 – 2012-05-24 (×4): 180 mg via ORAL
  Filled 2012-05-21 (×4): qty 1

## 2012-05-21 MED ORDER — CALCIUM CARBONATE-VITAMIN D 500-200 MG-UNIT PO TABS
1.0000 | ORAL_TABLET | Freq: Two times a day (BID) | ORAL | Status: DC
  Administered 2012-05-21 – 2012-05-24 (×6): 1 via ORAL
  Filled 2012-05-21 (×8): qty 1

## 2012-05-21 NOTE — H&P (Signed)
[facility] History and Physical  [name]:[id number] DOB: [birth year]    PCP:   Kendra Gasser, MD   Chief Complaint: Feeling malaise and weak.  HPI: [name] is an 76 y.o. female with hisotry of recently Dx multiple myeloma, began her chemotherapy treatment last week, Hx of HTN, depression, prior hx of subdural hematoma, presents to the [facility] ER complaining of feeling fatigue and weak.  She was evaluated in the ER and found to have a CXR showing retrocardiac infiltrate vs atelectasis, her WBC is 7.6 K, her UA showed moderate leukocytes and 7-10 WBC's.  She does have an anemia with Hb of 7.6, with normal renal functions. In the ER, she has atrial fibrillation which she has before, given IVF, and spontaneously converted to NSR.   Hospitalist was asked to admit her for UTI and possible PNA.    Rewiew of Systems:  Constitutional: Negative for fever and chills. No significant weight loss or weight gain Eyes: Negative for eye pain, redness and discharge, diplopia, visual changes, or flashes of light. ENMT: Negative for ear pain, hoarseness, nasal congestion, sinus pressure and sore throat. No headaches; tinnitus, drooling, or problem swallowing. Cardiovascular: Negative for chest pain, palpitations, diaphoresis, dyspnea and peripheral edema. ; No orthopnea, PND Respiratory: Negative for cough, hemoptysis, wheezing and stridor. No pleuritic chestpain. Gastrointestinal: Negative for nausea, vomiting, diarrhea, constipation, abdominal pain, melena, blood in stool, hematemesis, jaundice and rectal bleeding.    Genitourinary: Negative for frequency, dysuria, incontinence,flank pain and hematuria; Musculoskeletal: Negative for back pain and neck pain. Negative for swelling and trauma.;  Skin: . Negative for pruritus, rash, abrasions, bruising and skin lesion.; ulcerations Neuro: Negative for headache, lightheadedness and neck stiffness. Negative for weakness, altered level of  consciousness , altered mental status, extremity weakness, burning feet, involuntary movement, seizure and syncope.  Psych: negative for anxiety, depression, insomnia, tearfulness, panic attacks, hallucinations, paranoia, suicidal or homicidal ideation    Past Medical History  Diagnosis Date  . Anxiety   . Hypertension   . Hyperlipidemia   . Depression   . Osteopenia   . Multiple myeloma 03/2012  . Acute renal failure      due to hypercalcemia and suspected multiple myeloma resolved =with IVF's    . Lytic bone lesions on xray   . Acute subdural hematoma 03/2012     recent fall in hospital stay,resolved+  . Normocytic anemia     due to chemo and multiple myeloma,no active bleding  . Cancer     mul;tiple myeloma, bone lesions r 7th rib,   . Arthritis     spine  . Atrial fibrillation     Past Surgical History  Procedure Date  . Breast lumpectomy   . 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 Tonsillectomy     age 89    Medications:  HOME MEDS: Prior to Admission medications   Medication Sig Start Date End Date Taking? Authorizing Provider  acetaminophen (TYLENOL) 325 MG tablet Take 650 mg by mouth every 4 (four) hours as needed.    Yes Historical Provider, MD  acyclovir (ZOVIRAX) 400 MG tablet Take 400 mg by mouth daily.   Yes Historical Provider, MD  ALPRAZolam Prudy Feeler) 1 MG tablet Take 1 mg by mouth at bedtime as needed. Sleep.   Yes Historical Provider, MD  calcium-vitamin D (OSCAL WITH D) 500-200 MG-UNIT per tablet Take 1 tablet by mouth 2 (two) times daily.  Yes Historical Provider, MD  cyclobenzaprine (FLEXERIL) 5 MG tablet Take 5 mg by mouth 3 (three) times daily as needed. Muscle pain   Yes Historical Provider, MD  dexamethasone (DECADRON) 4 MG tablet Take 40 mg by mouth every Tuesday. with chemotherapy drug 04/20/12  Yes Evlyn Kanner Love, PA  diltiazem (CARDIZEM CD) 180 MG 24 hr  capsule Take 1 capsule (180 mg total) by mouth daily. 05/04/12 05/04/13 Yes Amber Nydia Bouton, MD  enoxaparin (LOVENOX) 40 MG/0.4ML injection Inject 0.4 mLs (40 mg total) into the skin daily. 04/26/12  Yes Exie Parody, MD  fentaNYL (DURAGESIC - DOSED MCG/HR) 12 MCG/HR Place 1 patch (12.5 mcg total) onto the skin every 3 (three) days. 05/04/12 06/03/12 Yes Jayce G Cook, DO  fentaNYL (DURAGESIC - DOSED MCG/HR) 25 MCG/HR Place 1 patch (25 mcg total) onto the skin every 3 (three) days. 04/21/12 05/21/12 Yes Evlyn Kanner Love, PA  gabapentin (NEURONTIN) 300 MG capsule Take 300 mg by mouth 2 (two) times daily.   Yes Historical Provider, MD  HYDROcodone-acetaminophen (VICODIN) 5-500 MG per tablet Take 1 tablet by mouth every 6 (six) hours as needed. pain   Yes Historical Provider, MD  lenalidomide (REVLIMID) 15 MG capsule Take 1 capsule (15 mg total) by mouth daily. 05/19/12  Yes Exie Parody, MD  lidocaine (LIDODERM) 5 % Place 2 patches onto the skin daily. Remove & Discard patch within 12 hours or as directed by MD 04/20/12 05/20/12 Yes Evlyn Kanner Love, PA  ondansetron (ZOFRAN) 4 MG tablet Take 4 mg by mouth. Take 1 tablet every 8 hours as needed for nausea 04/21/12  Yes Historical Provider, MD  oxyCODONE (OXY IR/ROXICODONE) 5 MG immediate release tablet Take 5-10 mg by mouth every 4 (four) hours as needed. pain   Yes Historical Provider, MD  oxyCODONE-acetaminophen (PERCOCET) 7.5-325 MG per tablet Take by mouth. Take 1 tablet every 4-6 hours as needed.  7.5-325mg  03/29/12  Yes Historical Provider, MD  polyethylene glycol powder (GLYCOLAX/MIRALAX) powder Take 17 g by mouth 2 (two) times daily. Take as directed 04/20/12  Yes Historical Provider, MD  prochlorperazine (COMPAZINE) 10 MG tablet Take 10 mg by mouth. Take 1 tablet every 6 hours as directed 04/12/12  Yes Historical Provider, MD  QUEtiapine (SEROQUEL) 100 MG tablet Take 100 mg by mouth daily.   Yes Historical Provider, MD  QUEtiapine (SEROQUEL) 25 MG tablet Take 75 mg by mouth at  bedtime.   Yes Historical Provider, MD  rosuvastatin (CRESTOR) 10 MG tablet Take 10 mg by mouth at bedtime.    Yes Historical Provider, MD  sennosides-docusate sodium (SENOKOT-S) 8.6-50 MG tablet Take 1 tablet by mouth daily.   Yes Historical Provider, MD  sulfamethoxazole-trimethoprim (BACTRIM DS) 800-160 MG per tablet Take 1 tablet by mouth 3 (three) times a week. Take one Monday Wednesday and Friday   Yes Historical Provider, MD  traMADol (ULTRAM) 50 MG tablet Take 25 mg by mouth at bedtime as needed. Give 1/2 tablet for pain 03/19/12  Yes Historical Provider, MD     Allergies:  Allergies  Allergen Reactions  . Simvastatin Other (See Comments)    Unsure, Joint aches listed previously     Social History:   reports that she has never smoked. She has never used smokeless tobacco. She reports that she does not drink alcohol or use illicit drugs.  Family History: Family History  Problem Relation Age of Onset  . Heart disease Mother   . Hyperlipidemia Sister   . Hypertension Sister   .  Diabetes Sister   . Diabetes Sister   . Hyperlipidemia Sister   . Hypertension Sister   . Sudden death Neg Hx   . Heart attack Neg Hx      Physical Exam: Filed Vitals:   05/21/12 0200 05/21/12 0208 05/21/12 0245 05/21/12 0604  BP: 116/66 113/59 93/60 99/65   Pulse: 85 85 80 76  Temp:  99.2 F (37.3 C) 98.8 F (37.1 C) 97.5 F (36.4 C)  TempSrc:  Oral Oral Oral  Resp: 9 15 16 16   Height:   5\' 7"  (1.702 m)   Weight:   80.8 kg (178 lb 2.1 oz)   SpO2: 97% 100% 96% 91%   Blood pressure 99/65, pulse 76, temperature 97.5 F (36.4 C), temperature source Oral, resp. rate 16, height 5\' 7"  (1.702 m), weight 80.8 kg (178 lb 2.1 oz), SpO2 91.00%.  GEN:  Pleasant  patient lying in the stretcher in no acute distress; cooperative with exam. PSYCH:  alert and oriented x4; does not appear anxious or depressed; affect is appropriate. HEENT: Mucous membranes pink and anicteric; PERRLA; EOM intact; no  cervical lymphadenopathy nor thyromegaly or carotid bruit; no JVD; There were no stridor. Neck is very supple. Breasts:: Not examined CHEST WALL: No tenderness CHEST: Normal respiration, clear to auscultation bilaterally.  HEART: Regular rate and rhythm.  There are no murmur, rub, or gallops.   BACK: No kyphosis or scoliosis; no CVA tenderness ABDOMEN: soft and non-tender; no masses, no organomegaly, normal abdominal bowel sounds; no pannus; no intertriginous candida. There is no rebound and no distention. Rectal Exam: Not done EXTREMITIES: No bone or joint deformity; age-appropriate arthropathy of the hands and knees; no edema; no ulcerations.  There is no calf tenderness. Genitalia: not examined PULSES: 2+ and symmetric SKIN: Normal hydration no rash or ulceration CNS: Cranial nerves 2-12 grossly intact no focal lateralizing neurologic deficit.  Speech is fluent; uvula elevated with phonation, facial symmetry and tongue midline. DTR are normal bilaterally, cerebella exam is intact, barbinski is negative and strengths are equaled bilaterally.  No sensory loss.   Labs on Admission:  Basic Metabolic Panel:  Lab 05/21/12 1610 05/20/12 2130 05/16/12 1534  NA 135 135 138  K 3.8 3.9 3.7  CL 101 100 100  CO2 25 27 27   GLUCOSE 125* 130* 111*  BUN 10 11 8   CREATININE 0.87 0.88 0.85  CALCIUM 7.4* 8.3* 8.4  MG -- -- --  PHOS -- -- --   Liver Function Tests:  Lab 05/20/12 2130 05/16/12 1534  AST 9 9  ALT 10 12  ALKPHOS 179* 183*  BILITOT 0.2* 0.4  PROT 5.2* 6.0  ALBUMIN 2.2* 2.5*   No results found for this basename: LIPASE:5,AMYLASE:5 in the last 168 hours No results found for this basename: AMMONIA:5 in the last 168 hours CBC:  Lab 05/21/12 0513 05/20/12 2130 05/16/12 1534  WBC 7.6 7.3 5.6  NEUTROABS -- 6.0 3.5  HGB 7.6* 8.7* 8.8*  HCT 24.1* 26.8* 26.9*  MCV 94.1 93.7 92.4  PLT 158 199 267   Cardiac Enzymes: No results found for this basename:  CKTOTAL:5,CKMB:5,CKMBINDEX:5,TROPONINI:5 in the last 168 hours  CBG: No results found for this basename: GLUCAP:5 in the last 168 hours   Radiological Exams on Admission: Dg Chest Portable 1 View  05/20/2012  *RADIOLOGY REPORT*  Clinical Data: Fever  PORTABLE CHEST - 1 VIEW  Comparison: 05/03/2012  Findings: Heart size upper normal.  Aortic arch atherosclerosis. Interstitial prominence.  Mild retrocardiac opacity.  No pleural  effusion or pneumothorax.  No acute osseous finding. Previously described right chest wall mass has decreased in size.  IMPRESSION: Mild retrocardiac opacity; atelectasis versus early infiltrate.   Original Report Authenticated By: Waneta Martins, M.D.     Assessment/Plan Present on Admission:  .PNA (pneumonia) .New onset atrial fibrillation .Multiple myeloma .DEPRESSION .HYPERLIPIDEMIA NEC/NOS Anemia UTI Afib with RVR.  PLAN:  Will admit her for UTI and possible anemia.  She was started on Vanc/Zosyn, and I will continue them.  She did have afib, but converted spontaneously.  For her MM, will continue her meds.  She does have anemia, which likely contributed her to feeling weak and malaise.  Will transfuse her 2 units of PRBCs.  She is stable, full code, and will be admitted to telemetry under [facility] service.  Other plans as per orders.  Code Status: FULL CODE>   Houston Siren, MD. [facility] Pager [phone] 7pm to 7am.  05/21/2012, 6:49 AM

## 2012-05-21 NOTE — Progress Notes (Signed)
ANTIBIOTIC CONSULT NOTE - INITIAL  Pharmacy Consult for Zosyn Indication: rule out pneumonia  Allergies  Allergen Reactions  . Simvastatin Other (See Comments)    Unsure, Joint aches listed previously     Patient Measurements: Height: 5\' 7"  (170.2 cm) Weight: 178 lb 2.1 oz (80.8 kg) (bed scale) IBW/kg (Calculated) : 61.6  Adjusted Body Weight:   Vital Signs: Temp: 97.5 F (36.4 C) (08/24 0604) Temp src: Oral (08/24 0604) BP: 99/65 mmHg (08/24 0604) Pulse Rate: 76  (08/24 0604) Intake/Output from previous day: 08/23 0701 - 08/24 0700 In: 713.3 [I.V.:713.3] Out: -  Intake/Output from this shift: Total I/O In: -  Out: 200 [Urine:200]  Labs:  Basename 05/21/12 1000 05/21/12 0513 05/20/12 2130  WBC -- 7.6 7.3  HGB 7.1* 7.6* 8.7*  PLT -- 158 199  LABCREA -- -- --  CREATININE -- 0.87 0.88   Estimated Creatinine Clearance: 62.1 ml/min (by C-G formula based on Cr of 0.87). No results found for this basename: VANCOTROUGH:2,VANCOPEAK:2,VANCORANDOM:2,GENTTROUGH:2,GENTPEAK:2,GENTRANDOM:2,TOBRATROUGH:2,TOBRAPEAK:2,TOBRARND:2,AMIKACINPEAK:2,AMIKACINTROU:2,AMIKACIN:2, in the last 72 hours   Microbiology: Recent Results (from the past 720 hour(s))  MRSA PCR SCREENING     Status: Normal   Collection Time   05/21/12  4:58 AM      Component Value Range Status Comment   MRSA by PCR NEGATIVE  NEGATIVE Final     Medical History: Past Medical History  Diagnosis Date  . Anxiety   . Hypertension   . Hyperlipidemia   . Depression   . Osteopenia   . Multiple myeloma 03/2012  . Acute renal failure      due to hypercalcemia and suspected multiple myeloma resolved =with IVF's    . Lytic bone lesions on xray   . Acute subdural hematoma 03/2012     recent fall in hospital stay,resolved+  . Normocytic anemia     due to chemo and multiple myeloma,no active bleding  . Cancer     mul;tiple myeloma, bone lesions r 7th rib,   . Arthritis     spine  . Atrial fibrillation      Medications:  Scheduled:    . sodium chloride   Intravenous Once  . sodium chloride   Intravenous STAT  . acetaminophen  650 mg Oral Once  . acyclovir  400 mg Oral Daily  . atorvastatin  20 mg Oral q1800  . calcium-vitamin D  1 tablet Oral BID  . diltiazem  180 mg Oral Daily  . docusate sodium  100 mg Oral BID  . enoxaparin (LOVENOX) injection  40 mg Subcutaneous Q24H  . fentaNYL  12.5 mcg Transdermal Q72H  . fentaNYL  25 mcg Transdermal Q72H  . gabapentin  300 mg Oral BID  . lenalidomide  15 mg Oral Daily  . piperacillin-tazobactam (ZOSYN)  IV  3.375 g Intravenous Once  . piperacillin-tazobactam  3.375 g Intravenous Q8H  . polyethylene glycol powder  17 g Oral BID  . QUEtiapine  100 mg Oral Daily  . QUEtiapine  75 mg Oral QHS  . sennosides-docusate sodium  1 tablet Oral Daily  . sodium chloride  500 mL Intravenous Once  . sodium chloride  3 mL Intravenous Q12H  . sulfamethoxazole-trimethoprim  1 tablet Oral 3 times weekly  . vancomycin  750 mg Intravenous Q12H  . vancomycin  1,000 mg Intravenous Once  . DISCONTD: vancomycin  1 g Intravenous Once   Infusions:    . dextrose 5 % and 0.9% NaCl 100 mL/hr at 05/21/12 0326   PRN: acetaminophen, ALPRAZolam, cyclobenzaprine,  HYDROcodone-acetaminophen, morphine injection, ondansetron (ZOFRAN) IV, ondansetron, zolpidem, DISCONTD: ondansetron (ZOFRAN) IV Assessment:  76 yo with hx of multiple myeloma admitted with PNA (? HCAP and UTI  Goal of Therapy:  Eradication of infection  Plan:  Zosyn 3.375 Gm IV q 8 hours (infuse over 4 hours)--1st dose at ~ 1130 Follow up culture results  Loletta Specter 05/21/2012,1:56 PM

## 2012-05-21 NOTE — Progress Notes (Signed)
Husband states that patient had allergy to statins.  Unsure of which on and patient was unable to verify due to sleepness. Lipitor not given, refused.

## 2012-05-21 NOTE — Progress Notes (Signed)
Pt had 6 beat run Vtach. Pt is alert and oriented. Resting in his bed. No complaint

## 2012-05-21 NOTE — Progress Notes (Signed)
Patient sleeping during shift change.  Having difficultly arousing patient to take her temperature.  Asked the husband present in the room if this is her baseline and he states it is.  I asked if she is usually this hard to arouse from sleep and he states, yes she is a heavy sleeper.  She spoke a few words and fell back to sleep.  Will continue to monitor.

## 2012-05-21 NOTE — Progress Notes (Signed)
[facility] PROGRESS NOTE  [name] EAV:[id number] DOB: 1935/10/10 DOA: 05/20/2012 PCP: [name], MD  Primary Oncologist: [name], MD  Assessment/Plan: Principal Problem:  *Fever Active Problems:  HYPERLIPIDEMIA NEC/NOS  DEPRESSION  HYPERTENSION  Anemia  Multiple myeloma  New onset atrial fibrillation  PNA (pneumonia)  UTI (lower urinary tract infection)  1. Possible HCAP: Continue IV vancomycin and Zosyn (05/21/12) 2. Possible urinary tract infection: Continue IV Zosyn (05/21/12) 3. Febrile illness: Secondary to problems #1 and 2 and management as above. 4. Anemia: Secondary to multiple myeloma and chemotherapy. We'll transfuse one unit of PRBC and follow CBC in a.m. 5. New-onset brief atrial fibrillation: Reverted to sinus rhythm in the ED. Continue to monitor on telemetry. TSH normal and recent echo shows LVEF 60-65%. 6. Multiple myeloma: Continue home chemotherapy regimen. 7. Hypertension: Controlled.  Code Status: Full Family Communication: Discussed with patient spouse and son at bedside and updated care and answered questions (8/24) Disposition Plan: To be determined.   Brief narrative: 76 y.o. Female, currently resident of SNF, with history of recently Dx multiple myeloma, began chemotherapy treatment on 8/22, Hx of HTN, depression, prior hx of subdural hematoma, presented to the [facility] ER complaining of feeling fatigued and weak. She was evaluated in the ER and found to have a CXR showing retrocardiac infiltrate vs atelectasis, WBC was 7.6 K, UA showed moderate leukocytes and 7-10 WBC's and anemia with Hb of 7.6.   Consultants:  None  Procedures:  None  Antibiotics:  IV vancomycin and Zosyn (8/24)   HPI/Subjective: Patient denies any complaints. She denies cough, dyspnea or chest pain. No palpitations. No nausea, vomiting, abdominal pain or diarrhea. Denies dysuria or urinary frequency. No earache or sore throat. According to family, patient  is looking much better than on admission earlier this morning.  Objective: Filed Vitals:   05/21/12 0200 05/21/12 0208 05/21/12 0245 05/21/12 0604  BP: 116/66 113/59 93/60 99/65   Pulse: 85 85 80 76  Temp:  99.2 F (37.3 C) 98.8 F (37.1 C) 97.5 F (36.4 C)  TempSrc:  Oral Oral Oral  Resp: 9 15 16 16   Height:   5\' 7"  (1.702 m)   Weight:   80.8 kg (178 lb 2.1 oz)   SpO2: 97% 100% 96% 91%    Intake/Output Summary (Last 24 hours) at 05/21/12 1330 Last data filed at 05/21/12 1207  Gross per 24 hour  Intake 713.34 ml  Output    200 ml  Net 513.34 ml   Filed Weights   05/21/12 0245  Weight: 80.8 kg (178 lb 2.1 oz)    Exam:  General exam: Comfortable and in no obvious distress. Respiratory system: Clear to auscultation. No increased work of breathing. Cardiovascular system: First and second heart sounds heard, regular. No JVD, murmurs or pedal edema. Telemetry shows sinus rhythm. Gastrointestinal system: Abdomen is nondistended, soft and normal bowel sounds heard. Central nervous system: Alert and oriented. No focal neurological deficits. Extremities: Symmetric 5 x 5 power.  Data Reviewed: Basic Metabolic Panel:  Lab 05/21/12 7829 05/20/12 2130 05/16/12 1534  NA 135 135 138  K 3.8 3.9 3.7  CL 101 100 100  CO2 25 27 27   GLUCOSE 125* 130* 111*  BUN 10 11 8   CREATININE 0.87 0.88 0.85  CALCIUM 7.4* 8.3* 8.4  MG -- -- --  PHOS -- -- --   Liver Function Tests:  Lab 05/20/12 2130 05/16/12 1534  AST 9 9  ALT 10 12  ALKPHOS 179* 183*  BILITOT  0.2* 0.4  PROT 5.2* 6.0  ALBUMIN 2.2* 2.5*   No results found for this basename: LIPASE:5,AMYLASE:5 in the last 168 hours No results found for this basename: AMMONIA:5 in the last 168 hours CBC:  Lab 05/21/12 1000 05/21/12 0513 05/20/12 2130 05/16/12 1534  WBC -- 7.6 7.3 5.6  NEUTROABS -- -- 6.0 3.5  HGB 7.1* 7.6* 8.7* 8.8*  HCT 22.2* 24.1* 26.8* 26.9*  MCV -- 94.1 93.7 92.4  PLT -- 158 199 267   Cardiac Enzymes: No  results found for this basename: CKTOTAL:5,CKMB:5,CKMBINDEX:5,TROPONINI:5 in the last 168 hours BNP (last 3 results)  Basename 05/03/12 1114  PROBNP 853.3*   CBG: No results found for this basename: GLUCAP:5 in the last 168 hours  Recent Results (from the past 240 hour(s))  MRSA PCR SCREENING     Status: Normal   Collection Time   05/21/12  4:58 AM      Component Value Range Status Comment   MRSA by PCR NEGATIVE  NEGATIVE Final      Studies: Dg Chest Portable 1 View  05/20/2012  *RADIOLOGY REPORT*  Clinical Data: Fever  PORTABLE CHEST - 1 VIEW  Comparison: 05/03/2012  Findings: Heart size upper normal.  Aortic arch atherosclerosis. Interstitial prominence.  Mild retrocardiac opacity.  No pleural effusion or pneumothorax.  No acute osseous finding. Previously described right chest wall mass has decreased in size.  IMPRESSION: Mild retrocardiac opacity; atelectasis versus early infiltrate.   Original Report Authenticated By: Waneta Martins, M.D.    Dg Chest Portable 1 View  05/03/2012  *RADIOLOGY REPORT*  Clinical Data: Fall, tachycardia with palpitations  PORTABLE CHEST - 1 VIEW  Comparison: CT thorax 04/06/2012  Findings: Mild enlarged heart silhouette.  There is no effusion, infiltrate, or pneumothorax.  Mild central venous congestion is present.  Right chest wall mass is decreased in size from comparison CT.  IMPRESSION:  1.  No acute cardiopulmonary findings. 2.  Decrease in volume of right chest wall mass.  Original Report Authenticated By: Genevive Bi, M.D.    Scheduled Meds:    . sodium chloride   Intravenous Once  . sodium chloride   Intravenous STAT  . acetaminophen  650 mg Oral Once  . docusate sodium  100 mg Oral BID  . enoxaparin (LOVENOX) injection  40 mg Subcutaneous Q24H  . piperacillin-tazobactam (ZOSYN)  IV  3.375 g Intravenous Once  . piperacillin-tazobactam  3.375 g Intravenous Q8H  . sodium chloride  500 mL Intravenous Once  . sodium chloride  3 mL  Intravenous Q12H  . vancomycin  750 mg Intravenous Q12H  . vancomycin  1,000 mg Intravenous Once  . DISCONTD: vancomycin  1 g Intravenous Once   Continuous Infusions:    . dextrose 5 % and 0.9% NaCl 100 mL/hr at 05/21/12 8469    Principal Problem:  *Fever Active Problems:  HYPERLIPIDEMIA NEC/NOS  DEPRESSION  HYPERTENSION  Anemia  Multiple myeloma  New onset atrial fibrillation  PNA (pneumonia)  UTI (lower urinary tract infection)    Time spent: 25 mins   [facility]  [facility] Pager [phone]. If 8PM-8AM, please contact night-coverage at [email or web address], password [facility] 05/21/2012, 1:30 PM  LOS: 1 day

## 2012-05-21 NOTE — ED Provider Notes (Signed)
History     CSN: [id number]  Arrival date & time 05/20/12  1959   First MD Initiated Contact with Patient 05/20/12 2238      Chief Complaint  Patient presents with  . Fever    (Consider location/radiation/quality/duration/timing/severity/associated sxs/prior treatment) HPI Comments: [name] is a 76 y.o. Female who presents to ER with complaint of fever. Ptwith diagnosis of multiple myeloma, just started chemotherapy yesterday. Fever began today, up to 101. No specific complaints. Pt denies URI symptoms, no urinary symptoms, no n/v/d. No neck pain or headache. No abdominal pain. Tyelnol taking prior to the arrival.  The history is provided by the patient.    Past Medical History  Diagnosis Date  . Anxiety   . Hypertension   . Hyperlipidemia   . Depression   . Osteopenia   . Multiple myeloma 03/2012  . Acute renal failure      due to hypercalcemia and suspected multiple myeloma resolved =with IVF's    . Lytic bone lesions on xray   . Acute subdural hematoma 03/2012     recent fall in hospital stay,resolved+  . Normocytic anemia     due to chemo and multiple myeloma,no active bleding  . Cancer     mul;tiple myeloma, bone lesions r 7th rib,   . Arthritis     spine  . Atrial fibrillation     Past Surgical History  Procedure Date  . Breast lumpectomy   . 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 Tonsillectomy     age 67    Family History  Problem Relation Age of Onset  . Heart disease Mother   . Hyperlipidemia Sister   . Hypertension Sister   . Diabetes Sister   . Diabetes Sister   . Hyperlipidemia Sister   . Hypertension Sister   . Sudden death Neg Hx   . Heart attack Neg Hx     History  Substance Use Topics  . Smoking status: Never Smoker   . Smokeless tobacco: Never Used  . Alcohol Use: No    OB History    Grav Para Term Preterm Abortions TAB SAB Ect  Mult Living                  Review of Systems  Constitutional: Positive for fever and chills.  HENT: Negative for ear pain, sore throat, neck pain, neck stiffness and sinus pressure.   Eyes: Negative for photophobia.  Respiratory: Negative.   Cardiovascular: Negative.   Gastrointestinal: Negative.   Genitourinary: Negative for dysuria, urgency, frequency and flank pain.  Musculoskeletal: Negative.   Skin: Negative.   Neurological: Positive for weakness. Negative for dizziness and headaches.  Hematological: Negative.     Allergies  Simvastatin  Home Medications  No current outpatient prescriptions on file.  BP 99/65  Pulse 76  Temp 97.5 F (36.4 C) (Oral)  Resp 16  Ht 5\' 7"  (1.702 m)  Wt 178 lb 2.1 oz (80.8 kg)  BMI 27.90 kg/m2  SpO2 91%  Physical Exam  Nursing note and vitals reviewed. Constitutional: She is oriented to person, place, and time. She appears well-developed and well-nourished. No distress.  HENT:  Head: Normocephalic.  Right Ear: External ear normal.  Left Ear: External ear normal.  Nose: Nose normal.  Mouth/Throat: Oropharynx is clear and moist.  Eyes: Conjunctivae are normal. Pupils are equal, round, and reactive to light.  Neck: Neck supple.  Cardiovascular:       Tachycardic, irregular rhythm  Pulmonary/Chest: Effort normal and breath sounds normal. No respiratory distress. She has no wheezes. She has no rales.  Abdominal: Soft. Bowel sounds are normal. She exhibits no distension. There is no tenderness. There is no rebound.  Musculoskeletal: Normal range of motion. She exhibits no edema.  Neurological: She is alert and oriented to person, place, and time.  Skin: Skin is warm and dry.  Psychiatric: She has a normal mood and affect.    ED Course  Procedures (including critical care time)  Labs Reviewed  COMPREHENSIVE METABOLIC PANEL - Abnormal; Notable for the following:    Glucose, Bld 130 (*)     Calcium 8.3 (*)     Total Protein 5.2  (*)     Albumin 2.2 (*)     Alkaline Phosphatase 179 (*)     Total Bilirubin 0.2 (*)     GFR calc non Af Amer 63 (*)     GFR calc Af Amer 73 (*)     All other components within normal limits  CBC WITH DIFFERENTIAL - Abnormal; Notable for the following:    RBC 2.86 (*)     Hemoglobin 8.7 (*)     HCT 26.8 (*)     RDW 17.3 (*)     Neutrophils Relative 82 (*)     Lymphocytes Relative 11 (*)     All other components within normal limits  URINALYSIS, ROUTINE W REFLEX MICROSCOPIC - Abnormal; Notable for the following:    APPearance CLOUDY (*)     Hgb urine dipstick MODERATE (*)     Leukocytes, UA MODERATE (*)     All other components within normal limits  URINE MICROSCOPIC-ADD ON - Abnormal; Notable for the following:    Squamous Epithelial / LPF FEW (*)     Bacteria, UA FEW (*)     All other components within normal limits  BASIC METABOLIC PANEL - Abnormal; Notable for the following:    Glucose, Bld 125 (*)     Calcium 7.4 (*)     GFR calc non Af Amer 64 (*)     GFR calc Af Amer 74 (*)     All other components within normal limits  CBC - Abnormal; Notable for the following:    RBC 2.56 (*)     Hemoglobin 7.6 (*)     HCT 24.1 (*)     RDW 17.4 (*)     All other components within normal limits  LACTIC ACID, PLASMA  MRSA PCR SCREENING  CULTURE, BLOOD (ROUTINE X 2)  CULTURE, BLOOD (ROUTINE X 2)  URINE CULTURE  TSH  PREPARE RBC (CROSSMATCH)   Dg Chest Portable 1 View  05/20/2012  *RADIOLOGY REPORT*  Clinical Data: Fever  PORTABLE CHEST - 1 VIEW  Comparison: 05/03/2012  Findings: Heart size upper normal.  Aortic arch atherosclerosis. Interstitial prominence.  Mild retrocardiac opacity.  No pleural effusion or pneumothorax.  No acute osseous finding. Previously described right chest wall mass has decreased in size.  IMPRESSION: Mild retrocardiac opacity; atelectasis versus early infiltrate.   Original Report Authenticated By: Waneta Martins, M.D.    Pt with fever, possible UTi  and CAP on CXR. Pt is a cancer pt, on chemo yesterday. Fluids started, given tylenol for fever, started on antibiotics prophylactically, given pt is tachy, borderline BP, concern for sepsis. Pt is in afib, hx of the same. Will admit to [facility].   Filed Vitals:  05/21/12 0604  BP: 99/65  Pulse: 76  Temp: 97.5 F (36.4 C)  Resp: 16    Date: 05/21/2012  Rate: 127  Rhythm: atrial fibrillation  QRS Axis: normal  Intervals: normal  ST/T Wave abnormalities: normal  Conduction Disutrbances:none  Narrative Interpretation:   Old EKG Reviewed: unchanged      1. A-fib   2. UTI (lower urinary tract infection)   3. CAP (community acquired pneumonia)   4. Multiple myeloma   5. Hip pain   6. Fever       MDM  See above        Lottie Mussel, [place] 05/21/12 [phone]

## 2012-05-21 NOTE — ED Notes (Signed)
RN obtained 1st set of Bld culture.

## 2012-05-21 NOTE — Progress Notes (Addendum)
ANTIBIOTIC CONSULT NOTE - INITIAL  Pharmacy Consult for Vancomycin Indication:  PNA  Allergies  Allergen Reactions  . Simvastatin Other (See Comments)    Unsure, Joint aches listed previously     Patient Measurements: Height: 5\' 7"  (170.2 cm) Weight: 178 lb 2.1 oz (80.8 kg) (bed scale) IBW/kg (Calculated) : 61.6    Vital Signs: Temp: 98.8 F (37.1 C) (08/24 0245) Temp src: Oral (08/24 0245) BP: 93/60 mmHg (08/24 0245) Pulse Rate: 80  (08/24 0245) Intake/Output from previous day:   Intake/Output from this shift:    Labs:  [facility] 05/20/12 2130  WBC 7.3  HGB 8.7*  PLT 199  LABCREA --  CREATININE 0.88   Estimated Creatinine Clearance: 61.4 ml/min (by C-G formula based on Cr of 0.88). No results found for this basename: VANCOTROUGH:2,VANCOPEAK:2,VANCORANDOM:2,GENTTROUGH:2,GENTPEAK:2,GENTRANDOM:2,TOBRATROUGH:2,TOBRAPEAK:2,TOBRARND:2,AMIKACINPEAK:2,AMIKACINTROU:2,AMIKACIN:2, in the last 72 hours   Microbiology: No results found for this or any previous visit (from the past 720 hour(s)).  Medical History: Past Medical History  Diagnosis Date  . Anxiety   . Hypertension   . Hyperlipidemia   . Depression   . Osteopenia   . Multiple myeloma 03/2012  . Acute renal failure      due to hypercalcemia and suspected multiple myeloma resolved =with IVF's    . Lytic bone lesions on xray   . Acute subdural hematoma 03/2012     recent fall in hospital stay,resolved+  . Normocytic anemia     due to chemo and multiple myeloma,no active bleding  . Cancer     mul;tiple myeloma, bone lesions r 7th rib,   . Arthritis     spine  . Atrial fibrillation     Medications:  Scheduled:    . sodium chloride   Intravenous Once  . sodium chloride   Intravenous STAT  . acetaminophen  650 mg Oral Once  . docusate sodium  100 mg Oral BID  . enoxaparin (LOVENOX) injection  40 mg Subcutaneous Q24H  . piperacillin-tazobactam (ZOSYN)  IV  3.375 g Intravenous Once  .  piperacillin-tazobactam  3.375 g Intravenous Q8H  . sodium chloride  500 mL Intravenous Once  . sodium chloride  3 mL Intravenous Q12H  . vancomycin  750 mg Intravenous Q12H  . vancomycin  1,000 mg Intravenous Once  . DISCONTD: vancomycin  1 g Intravenous Once   Infusions:    . dextrose 5 % and 0.9% NaCl 100 mL/hr at 05/21/12 4782   Assessment: 76 yo with history of multiple myeloma admitted with PNA.  Md dosing Zosyn and ordered Vancomycin per RX.  Goal of Therapy:  Vancomycin trough level 15-20 mcg/ml  Plan:   Vancomycin 1Gm x1 in ER then 750mg  IV q12h.  CrCl ~ 56 (N)  F/U SCr/levels  Xia Stohr R 05/21/2012,5:38 AM  Addendum:  Assessment:    VT = 12.3 mg/L  Slightly below goal of 15-20 mg/L for HCAP.  Plan:  Increase Vancomycin to 1Gm IV q12h. CrCl= 56 (N)  F/U SCr/levels as needed.  Lorenza Evangelist 05/23/2012 2:38 AM

## 2012-05-22 DIAGNOSIS — D649 Anemia, unspecified: Secondary | ICD-10-CM

## 2012-05-22 DIAGNOSIS — N390 Urinary tract infection, site not specified: Principal | ICD-10-CM

## 2012-05-22 LAB — BASIC METABOLIC PANEL
BUN: 6 mg/dL (ref 6–23)
Chloride: 103 mEq/L (ref 96–112)
Creatinine, Ser: 0.81 mg/dL (ref 0.50–1.10)
GFR calc Af Amer: 81 mL/min — ABNORMAL LOW (ref >90)
GFR calc non Af Amer: 70 mL/min — ABNORMAL LOW (ref >90)
Glucose, Bld: 107 mg/dL — ABNORMAL HIGH (ref 70–99)

## 2012-05-22 LAB — URINE CULTURE

## 2012-05-22 LAB — TYPE AND SCREEN

## 2012-05-22 LAB — CBC
HCT: 24.6 % — ABNORMAL LOW (ref 36.0–46.0)
Hemoglobin: 7.9 g/dL — ABNORMAL LOW (ref 12.0–15.0)
MCHC: 32.1 g/dL (ref 30.0–36.0)
MCV: 91.8 fL (ref 78.0–100.0)
RDW: 18.3 % — ABNORMAL HIGH (ref 11.5–15.5)

## 2012-05-22 MED ORDER — VITAMINS A & D EX OINT
TOPICAL_OINTMENT | CUTANEOUS | Status: AC
  Administered 2012-05-22: 17:00:00
  Filled 2012-05-22: qty 5

## 2012-05-22 MED ORDER — QUETIAPINE FUMARATE 100 MG PO TABS
100.0000 mg | ORAL_TABLET | Freq: Every day | ORAL | Status: DC
  Administered 2012-05-23 – 2012-05-24 (×2): 100 mg via ORAL
  Filled 2012-05-22 (×2): qty 1

## 2012-05-22 MED ORDER — QUETIAPINE FUMARATE 50 MG PO TABS
75.0000 mg | ORAL_TABLET | Freq: Every day | ORAL | Status: DC
  Administered 2012-05-22 – 2012-05-23 (×2): 75 mg via ORAL
  Filled 2012-05-22 (×3): qty 1

## 2012-05-22 NOTE — Evaluation (Signed)
Physical Therapy Evaluation Patient Details Name: [name] MRN: [id number] DOB: 01/16/1936 Today's Date: 05/22/2012 Time: 9629-5284 PT Time Calculation (min): 22 min  PT Assessment / Plan / Recommendation Clinical Impression  76 y/o WF admitted from SNF where she was getting rehab.  Pt presents with decreased I with functional mobility.  Recommend acute care PT with d/c back to SNF.    PT Assessment  Patient needs continued PT services    Follow Up Recommendations  Skilled nursing facility    Barriers to Discharge None      Equipment Recommendations  Defer to next venue    Recommendations for Other Services     Frequency Min 3X/week    Precautions / Restrictions   fall  Pertinent Vitals/Pain No c/o pain      Mobility  Transfers Transfers: Sit to Stand;Stand to Sit Sit to Stand: 4: Min assist;With upper extremity assist;From chair/3-in-1 Stand to Sit: 4: Min assist;To chair/3-in-1;Without upper extremity assist Details for Transfer Assistance: Poor hand placement despite cueing. Ambulation/Gait Ambulation/Gait Assistance: 1: +2 Total assist Ambulation/Gait: Patient Percentage: 70% Ambulation Distance (Feet): 25 Feet Assistive device: Rolling walker Ambulation/Gait Assistance Details: Pt veering left with gait.  2nd person A for safety due to pt's lethargy.  Gait Pattern: Lateral trunk lean to left;Decreased step length - right;Decreased step length - left    Exercises     PT Diagnosis: Difficulty walking  PT Problem List: Decreased strength;Decreased mobility;Decreased safety awareness;Decreased cognition;Decreased activity tolerance;Decreased balance PT Treatment Interventions: DME instruction;Gait training;Functional mobility training;Therapeutic activities;Therapeutic exercise;Balance training   PT Goals Acute Rehab PT Goals PT Goal Formulation: With patient Time For Goal Achievement: 05/29/12 Potential to Achieve Goals: Good Pt will go Supine/Side to  Sit: with modified independence PT Goal: Supine/Side to Sit - Progress: Goal set today Pt will go Sit to Stand: with modified independence;with upper extremity assist PT Goal: Sit to Stand - Progress: Goal set today Pt will go Stand to Sit: with modified independence;with upper extremity assist PT Goal: Stand to Sit - Progress: Goal set today Pt will Ambulate: 51 - 150 feet;with supervision;with rolling walker PT Goal: Ambulate - Progress: Goal set today  Visit Information  Last PT Received On: 05/22/12 Assistance Needed: +2 (due to lethargy/safety)    Subjective Data  Subjective: Grandson present and husband came during gait.  Pt up in recliner and very lethargic. Patient Stated Goal: Family's goal is to return to SNF.   Prior Functioning  Home Living Lives With: Other (Comment) (Currently at [facility]) Available Help at Discharge: Skilled Nursing Facility Additional Comments: Pt was at [facility] inpatient rehab then went home and fell, then to hospital and was d/ced to [facility] (end of July/August)per grandson    Cognition  Overall Cognitive Status: Impaired Area of Impairment: Awareness of deficits;Safety/judgement;Memory Arousal/Alertness: Lethargic Orientation Level: Disoriented to;Time;Situation Behavior During Session: Lethargic Cognition - Other Comments: Pt falling asleep quickly, but would wake up to attempt to answer question, and would frequently fall backasleep while attempting to answer.    Extremity/Trunk Assessment Right Lower Extremity Assessment RLE ROM/Strength/Tone: [facility] for tasks assessed Left Lower Extremity Assessment LLE ROM/Strength/Tone: WFL for tasks assessed   Balance    End of Session PT - End of Session Equipment Utilized During Treatment: Gait belt Activity Tolerance: Patient tolerated treatment well;Patient limited by fatigue Patient left: in chair;with call bell/phone within reach;with chair alarm set;with family/visitor present Nurse Communication:  Mobility status;Other (comment) (lethargy)  GP     Azeem Poorman LUBECK 05/22/2012, 1:50  PM   

## 2012-05-22 NOTE — Progress Notes (Addendum)
[facility] PROGRESS NOTE  [name] WUJ:[id number] DOB: 03/04/1936 DOA: 05/20/2012 PCP: Kendra Gasser, MD  Primary Oncologist: Kendra Bolus, MD  Assessment/Plan: Principal Problem:  *Fever Active Problems:  HYPERLIPIDEMIA NEC/NOS  DEPRESSION  HYPERTENSION  Anemia  Multiple myeloma  New onset atrial fibrillation  PNA (pneumonia)  UTI (lower urinary tract infection)  1. Possible HCAP: Continue IV vancomycin and Zosyn (05/21/12). Blood cultures are negative to date. Low-grade fevers. Continue current regimen. 2. Possible urinary tract infection: Continue IV Zosyn (05/21/12). Urine culture is suggestive of contamination. 3. Febrile illness: Secondary to problems #1 and 2 and management as above. Had low-grade fever this morning. If has recurrent high fevers then we'll consider ID consultation. 4. Anemia: Secondary to multiple myeloma and chemotherapy. Status post 1 unit PRBC on 8/24 with appropriate response. Repeat CBC in a.m. 5. Thrombocytopenia: Possibly from chemotherapy. Follow CBC in a.m. 6. New-onset brief atrial fibrillation: Reverted to sinus rhythm in the ED. Continue to monitor on telemetry. TSH normal and recent echo shows LVEF 60-65%. Remains in sinus rhythm. 7. Multiple myeloma: Continue home chemotherapy regimen. Will update/discussed with primary oncologist in a.m. 8. Hypertension: Controlled. 9. Altered mental status: Likely secondary to Seroquel. Will hold this morning's dose of Seroquel and monitor.  Code Status: Full Family Communication: Discussed with patient spouse and son at bedside and updated care and answered questions (8/25) Disposition Plan: To be determined.   Brief narrative: 76 y.o. Female, currently resident of SNF, with history of recently Dx multiple myeloma, began chemotherapy treatment on 8/22, Hx of HTN, depression, prior hx of subdural hematoma, presented to the [facility] ER complaining of feeling fatigued and weak. She was evaluated in the ER  and found to have a CXR showing retrocardiac infiltrate vs atelectasis, WBC was 7.6 K, UA showed moderate leukocytes and 7-10 WBC's and anemia with Hb of 7.6.   Consultants:  None  Procedures:  None  Antibiotics:  IV vancomycin and Zosyn (8/24)   HPI/Subjective: Patient was somnolent this morning but easily arousable and denies complaints. Patient received her twice a day doses of Seroquel yesterday late.  Objective: Filed Vitals:   05/21/12 2100 05/21/12 2158 05/22/12 0446 05/22/12 1000  BP:  97/57 94/47   Pulse:  78 82   Temp:  98.1 F (36.7 C) 98 F (36.7 C) 101.1 F (38.4 C)  TempSrc:  Axillary Axillary   Resp: 11  12   Height:      Weight:      SpO2:  97% 94%     Intake/Output Summary (Last 24 hours) at 05/22/12 1310 Last data filed at 05/22/12 0155  Gross per 24 hour  Intake 1791.34 ml  Output    225 ml  Net 1566.34 ml   Filed Weights   05/21/12 0245  Weight: 80.8 kg (178 lb 2.1 oz)    Exam:  General exam: Comfortable and in no obvious distress. Respiratory system: Clear to auscultation. No increased work of breathing. Cardiovascular system: First and second heart sounds heard, regular. No JVD, murmurs or pedal edema. Telemetry shows sinus rhythm. Gastrointestinal system: Abdomen is nondistended, soft and normal bowel sounds heard. Central nervous system: Somnolent but easily arousable and oriented to person and place and follow some commands. No focal neurological deficits. Extremities: Symmetric 5 x 5 power.  Data Reviewed: Basic Metabolic Panel:  Lab 05/22/12 9562 05/21/12 0513 05/20/12 2130 05/16/12 1534  NA 136 135 135 138  K 3.6 3.8 3.9 3.7  CL 103 101 100 100  CO2  29 25 27 27   GLUCOSE 107* 125* 130* 111*  BUN 6 10 11 8   CREATININE 0.81 0.87 0.88 0.85  CALCIUM 7.4* 7.4* 8.3* 8.4  MG -- -- -- --  PHOS -- -- -- --   Liver Function Tests:  Lab 05/20/12 2130 05/16/12 1534  AST 9 9  ALT 10 12  ALKPHOS 179* 183*  BILITOT 0.2* 0.4    PROT 5.2* 6.0  ALBUMIN 2.2* 2.5*   No results found for this basename: LIPASE:5,AMYLASE:5 in the last 168 hours No results found for this basename: AMMONIA:5 in the last 168 hours CBC:  Lab 05/22/12 0459 05/21/12 2119 05/21/12 1000 05/21/12 0513 05/20/12 2130 05/16/12 1534  WBC 4.7 -- -- 7.6 7.3 5.6  NEUTROABS -- -- -- -- 6.0 3.5  HGB 7.9* 8.3* 7.1* 7.6* 8.7* --  HCT 24.6* 24.9* 22.2* 24.1* 26.8* --  MCV 91.8 -- -- 94.1 93.7 92.4  PLT 119* -- -- 158 199 267   Cardiac Enzymes: No results found for this basename: CKTOTAL:5,CKMB:5,CKMBINDEX:5,TROPONINI:5 in the last 168 hours BNP (last 3 results)  Basename 05/03/12 1114  PROBNP 853.3*   CBG: No results found for this basename: GLUCAP:5 in the last 168 hours  Recent Results (from the past 240 hour(s))  URINE CULTURE     Status: Normal   Collection Time   05/20/12 11:10 PM      Component Value Range Status Comment   Specimen Description URINE, CLEAN CATCH   Final    Special Requests NONE   Final    Culture  Setup Time 05/21/2012 04:41   Final    Colony Count 20,OOO COLONIES/ML   Final    Culture     Final    Value: Multiple bacterial morphotypes present, none predominant. Suggest appropriate recollection if clinically indicated.   Report Status 05/22/2012 FINAL   Final   CULTURE, BLOOD (ROUTINE X 2)     Status: Normal (Preliminary result)   Collection Time   05/20/12 11:38 PM      Component Value Range Status Comment   Specimen Description BLOOD RIGHT HAND   Final    Special Requests BOTTLES DRAWN AEROBIC ONLY 5CC   Final    Culture  Setup Time 05/21/2012 04:48   Final    Culture     Final    Value:        BLOOD CULTURE RECEIVED NO GROWTH TO DATE CULTURE WILL BE HELD FOR 5 DAYS BEFORE ISSUING A FINAL NEGATIVE REPORT   Report Status PENDING   Incomplete   MRSA PCR SCREENING     Status: Normal   Collection Time   05/21/12  4:58 AM      Component Value Range Status Comment   MRSA by PCR NEGATIVE  NEGATIVE Final   CULTURE,  BLOOD (ROUTINE X 2)     Status: Normal (Preliminary result)   Collection Time   05/21/12  5:13 AM      Component Value Range Status Comment   Specimen Description BLOOD RIGHT HAND   Final    Special Requests BOTTLES DRAWN AEROBIC ONLY 2 CC   Final    Culture  Setup Time 05/21/2012 11:12   Final    Culture     Final    Value:        BLOOD CULTURE RECEIVED NO GROWTH TO DATE CULTURE WILL BE HELD FOR 5 DAYS BEFORE ISSUING A FINAL NEGATIVE REPORT   Report Status PENDING   Incomplete  Studies: Dg Chest Portable 1 View  05/20/2012  *RADIOLOGY REPORT*  Clinical Data: Fever  PORTABLE CHEST - 1 VIEW  Comparison: 05/03/2012  Findings: Heart size upper normal.  Aortic arch atherosclerosis. Interstitial prominence.  Mild retrocardiac opacity.  No pleural effusion or pneumothorax.  No acute osseous finding. Previously described right chest wall mass has decreased in size.  IMPRESSION: Mild retrocardiac opacity; atelectasis versus early infiltrate.   Original Report Authenticated By: Waneta Martins, M.D.    Dg Chest Portable 1 View  05/03/2012  *RADIOLOGY REPORT*  Clinical Data: Fall, tachycardia with palpitations  PORTABLE CHEST - 1 VIEW  Comparison: CT thorax 04/06/2012  Findings: Mild enlarged heart silhouette.  There is no effusion, infiltrate, or pneumothorax.  Mild central venous congestion is present.  Right chest wall mass is decreased in size from comparison CT.  IMPRESSION:  1.  No acute cardiopulmonary findings. 2.  Decrease in volume of right chest wall mass.  Original Report Authenticated By: Genevive Bi, M.D.    Scheduled Meds:    . sodium chloride   Intravenous STAT  . acyclovir  400 mg Oral Daily  . atorvastatin  20 mg Oral q1800  . calcium-vitamin D  1 tablet Oral BID  . diltiazem  180 mg Oral Daily  . enoxaparin (LOVENOX) injection  40 mg Subcutaneous Q24H  . fentaNYL  12.5 mcg Transdermal Q72H  . fentaNYL  25 mcg Transdermal Q72H  . gabapentin  300 mg Oral BID  .  lenalidomide  15 mg Oral Daily  . piperacillin-tazobactam  3.375 g Intravenous Q8H  . polyethylene glycol powder  17 g Oral BID  . QUEtiapine  100 mg Oral Daily  . QUEtiapine  75 mg Oral QHS  . senna-docusate  1 tablet Oral Daily  . sodium chloride  3 mL Intravenous Q12H  . vancomycin  750 mg Intravenous Q12H  . DISCONTD: docusate sodium  100 mg Oral BID  . DISCONTD: lenalidomide  15 mg Oral Daily  . DISCONTD: sennosides-docusate sodium  1 tablet Oral Daily  . DISCONTD: sulfamethoxazole-trimethoprim  1 tablet Oral 3 times weekly   Continuous Infusions:    . dextrose 5 % and 0.9% NaCl 75 mL/hr at 05/21/12 1540    Time spent: 20 mins   [facility]  [facility] Pager [phone]. If 8PM-8AM, please contact night-coverage at [email or web address], password [facility] 05/22/2012, 1:10 PM  LOS: 2 days

## 2012-05-22 NOTE — ED Provider Notes (Signed)
Medical screening examination/treatment/procedure(s) were performed by non-physician practitioner and as supervising physician I was immediately available for consultation/collaboration.   Laray Anger, DO 05/22/12 1740

## 2012-05-23 ENCOUNTER — Encounter: Payer: Self-pay | Admitting: *Deleted

## 2012-05-23 LAB — BASIC METABOLIC PANEL
CO2: 25 mEq/L (ref 19–32)
Glucose, Bld: 128 mg/dL — ABNORMAL HIGH (ref 70–99)
Potassium: 3.2 mEq/L — ABNORMAL LOW (ref 3.5–5.1)
Sodium: 135 mEq/L (ref 135–145)

## 2012-05-23 LAB — CBC WITH DIFFERENTIAL/PLATELET
Lymphocytes Relative: 13 % (ref 12–46)
Lymphs Abs: 0.6 10*3/uL — ABNORMAL LOW (ref 0.7–4.0)
Neutro Abs: 3.3 10*3/uL (ref 1.7–7.7)
Neutrophils Relative %: 74 % (ref 43–77)
Platelets: 131 10*3/uL — ABNORMAL LOW (ref 150–400)
RBC: 2.72 MIL/uL — ABNORMAL LOW (ref 3.87–5.11)
WBC: 4.5 10*3/uL (ref 4.0–10.5)

## 2012-05-23 LAB — VANCOMYCIN, TROUGH: Vancomycin Tr: 12.3 ug/mL (ref 10.0–20.0)

## 2012-05-23 MED ORDER — LEVOFLOXACIN 750 MG PO TABS
750.0000 mg | ORAL_TABLET | Freq: Every day | ORAL | Status: DC
  Administered 2012-05-23 – 2012-05-24 (×2): 750 mg via ORAL
  Filled 2012-05-23 (×2): qty 1

## 2012-05-23 MED ORDER — VANCOMYCIN HCL IN DEXTROSE 1-5 GM/200ML-% IV SOLN
1000.0000 mg | Freq: Two times a day (BID) | INTRAVENOUS | Status: DC
  Administered 2012-05-23: 1000 mg via INTRAVENOUS
  Filled 2012-05-23 (×2): qty 200

## 2012-05-23 MED ORDER — POTASSIUM CHLORIDE CRYS ER 20 MEQ PO TBCR
40.0000 meq | EXTENDED_RELEASE_TABLET | Freq: Once | ORAL | Status: AC
  Administered 2012-05-23: 40 meq via ORAL
  Filled 2012-05-23: qty 2

## 2012-05-23 MED ORDER — POLYETHYLENE GLYCOL 3350 17 G PO PACK
17.0000 g | PACK | Freq: Two times a day (BID) | ORAL | Status: DC
  Administered 2012-05-23 – 2012-05-24 (×2): 17 g via ORAL
  Filled 2012-05-23 (×3): qty 1

## 2012-05-23 NOTE — Progress Notes (Signed)
Fax received from [employer] they shipped pt's Revlimid on 8/23 for delivery on next business day.

## 2012-05-23 NOTE — Progress Notes (Signed)
Clinical Social Work Department BRIEF PSYCHOSOCIAL ASSESSMENT 05/23/2012  Patient:  [name], [name]     Account Number:  [id number]     Admit date:  05/20/2012  Clinical Social Worker:  Orpah Greek  Date/Time:  05/23/2012 12:53 PM  Referred by:  Physician  Date Referred:  05/23/2012 Referred for  Other - See comment   Other Referral:   Admitted from: [facility]   Interview type:  Patient Other interview type:   and son, Acupuncturist    PSYCHOSOCIAL DATA Living Status:  FACILITY Admitted from facility:  [facility] [place] Level of care:  Skilled Nursing Facility Primary support name:  Liesl Simons (husband) h#: [phone] c#: [phone] Primary support relationship to patient:  SPOUSE Degree of support available:   good    CURRENT CONCERNS Current Concerns  Post-Acute Placement   Other Concerns:    SOCIAL WORK ASSESSMENT / PLAN CSW spoke with patient & son, [name] at bedside re: discharge planning. Patient was admitted from [facility]F where she plans to return at discharge to continue her rehab stay.   Assessment/plan status:  Information/Referral to [facility] Other assessment/ plan:   Information/referral to community resources:   CSW completed FL2 and faxed information to [facility], confirmed with Hugo @ SNF that she is ok to return when ready.    PATIENT'S/FAMILY'S RESPONSE TO PLAN OF CARE: Patient is agreeable to return to [facility] at discharge.        Unice Bailey, LCSW [facility] Clinical Social Worker cell #: [phone]

## 2012-05-23 NOTE — Progress Notes (Signed)
   CARE MANAGEMENT NOTE 05/23/2012  Patient:  [name], [name]   Account Number:  [id number]  Date Initiated:  05/23/2012  Documentation initiated by:  Jiles Crocker  Subjective/Objective Assessment:   ADMITTED WITH FEVER, COLITIS     Action/Plan:   PATIENT RESIDES IN A NURSING FACILITY; SOCIAL WORKER REFERRAL PLACED   Anticipated DC Date:  05/30/2012   Anticipated DC Plan:  SKILLED NURSING FACILITY  In-house referral  Clinical Social Worker      DC Planning Services  CM consult            Status of service:  In process, will continue to follow Medicare Important Message given?  NA - LOS <3 / Initial given by admissions (If response is "NO", the following Medicare IM given date fields will be blank)  Per UR Regulation:  Reviewed for med. necessity/level of care/duration of stay   Comments:  05/23/2012- B Grayling Schranz RN, BSN, MHA

## 2012-05-23 NOTE — Progress Notes (Signed)
[facility] PROGRESS NOTE  [name] ZOX:[id number] DOB: 10-02-1935 DOA: 05/20/2012 PCP: [name], MD  Primary Oncologist: [name], MD  Assessment/Plan: Principal Problem:  *Fever Active Problems:  HYPERLIPIDEMIA NEC/NOS  DEPRESSION  HYPERTENSION  Anemia  Multiple myeloma  New onset atrial fibrillation  PNA (pneumonia)  UTI (lower urinary tract infection)  1. Possible HCAP: Blood cultures are negative to date. No further fevers. Asymptomatic. Will transition to oral levofloxacin. 2. Possible urinary tract infection: Urine culture is suggestive of contamination. Will transition to oral levofloxacin. 3. Febrile illness: Secondary to problems #1 and 2 and management as above. Improved. 4. Anemia: Secondary to multiple myeloma and chemotherapy. Status post 1 unit PRBC on 8/24 with appropriate response. Hemoglobin stable. 5. Thrombocytopenia: Possibly from chemotherapy. Improved. 6. New-onset brief atrial fibrillation: Reverted to sinus rhythm in the ED. was in A. fib overnight but reverted to sinus rhythm this morning. Continue to monitor on telemetry. TSH normal and recent echo shows LVEF 60-65%. Remains in sinus rhythm. 7. Multiple myeloma: Continue home chemotherapy regimen. Discussed with Dr. Gaylyn Rong on 8/26: Continue Revlimid and okay to hold Bactrim at this time. 8. Hypertension: Controlled. 9. Altered mental status: Likely secondary to Seroquel. Resolved.  Code Status: Full Family Communication: Discussed with patient spouse and son at bedside and updated care and answered questions (8/25) Disposition Plan: Possible discharge back to skilled nursing facility on 8/27.   Brief narrative: 76 y.o. Female, currently resident of SNF, with history of recently Dx multiple myeloma, began chemotherapy treatment on 8/22, Hx of HTN, depression, prior hx of subdural hematoma, presented to the [facility] ER complaining of feeling fatigued and weak. She was evaluated in the ER and  found to have a CXR showing retrocardiac infiltrate vs atelectasis, WBC was 7.6 K, UA showed moderate leukocytes and 7-10 WBC's and anemia with Hb of 7.6.   Consultants:  None  Procedures:  None  Antibiotics:  IV vancomycin and Zosyn (8/24-8/26)  Oral Levaquin (8/26)   HPI/Subjective: Patient denies complaints. Ate well at breakfast..  Objective: Filed Vitals:   05/22/12 1445 05/22/12 2233 05/23/12 0630 05/23/12 1357  BP: 117/76 116/77 92/61 99/65   Pulse: 87 116 68 79  Temp: 98.1 F (36.7 C) 98.5 F (36.9 C) 97.4 F (36.3 C) 98.2 F (36.8 C)  TempSrc: Oral Oral Axillary Oral  Resp: 18 18 19 18   Height:      Weight:      SpO2: 97% 91% 95% 95%    Intake/Output Summary (Last 24 hours) at 05/23/12 1522 Last data filed at 05/23/12 1300  Gross per 24 hour  Intake 4591.67 ml  Output    251 ml  Net 4340.67 ml   Filed Weights   05/21/12 0245  Weight: 80.8 kg (178 lb 2.1 oz)    Exam:  General exam: Comfortable and in no obvious distress. Respiratory system: Clear to auscultation. No increased work of breathing. Cardiovascular system: First and second heart sounds heard, regular. No JVD, murmurs or pedal edema. Telemetry shows sinus rhythm. Gastrointestinal system: Abdomen is nondistended, soft and normal bowel sounds heard. Central nervous system: Alert and oriented x3. No focal neurological deficits. Extremities: Symmetric 5 x 5 power.  Data Reviewed: Basic Metabolic Panel:  Lab 05/23/12 8119 05/22/12 0459 05/21/12 0513 05/20/12 2130 05/16/12 1534  NA 135 136 135 135 138  K 3.2* 3.6 3.8 3.9 3.7  CL 104 103 101 100 100  CO2 25 29 25 27 27   GLUCOSE 128* 107* 125* 130* 111*  BUN  5* 6 10 11 8   CREATININE 0.78 0.81 0.87 0.88 0.85  CALCIUM 7.0* 7.4* 7.4* 8.3* 8.4  MG -- -- -- -- --  PHOS -- -- -- -- --   Liver Function Tests:  Lab 05/20/12 2130 05/16/12 1534  AST 9 9  ALT 10 12  ALKPHOS 179* 183*  BILITOT 0.2* 0.4  PROT 5.2* 6.0  ALBUMIN 2.2* 2.5*    No results found for this basename: LIPASE:5,AMYLASE:5 in the last 168 hours No results found for this basename: AMMONIA:5 in the last 168 hours CBC:  Lab 05/23/12 0142 05/22/12 0459 05/21/12 2119 05/21/12 1000 05/21/12 0513 05/20/12 2130 05/16/12 1534  WBC 4.5 4.7 -- -- 7.6 7.3 5.6  NEUTROABS 3.3 -- -- -- -- 6.0 3.5  HGB 8.1* 7.9* 8.3* 7.1* 7.6* -- --  HCT 24.5* 24.6* 24.9* 22.2* 24.1* -- --  MCV 90.1 91.8 -- -- 94.1 93.7 92.4  PLT 131* 119* -- -- 158 199 267   Cardiac Enzymes: No results found for this basename: CKTOTAL:5,CKMB:5,CKMBINDEX:5,TROPONINI:5 in the last 168 hours BNP (last 3 results)  Basename 05/03/12 1114  PROBNP 853.3*   CBG: No results found for this basename: GLUCAP:5 in the last 168 hours  Recent Results (from the past 240 hour(s))  URINE CULTURE     Status: Normal   Collection Time   05/20/12 11:10 PM      Component Value Range Status Comment   Specimen Description URINE, CLEAN CATCH   Final    Special Requests NONE   Final    Culture  Setup Time 05/21/2012 04:41   Final    Colony Count 20,OOO COLONIES/ML   Final    Culture     Final    Value: Multiple bacterial morphotypes present, none predominant. Suggest appropriate recollection if clinically indicated.   Report Status 05/22/2012 FINAL   Final   CULTURE, BLOOD (ROUTINE X 2)     Status: Normal (Preliminary result)   Collection Time   05/20/12 11:38 PM      Component Value Range Status Comment   Specimen Description BLOOD RIGHT HAND   Final    Special Requests BOTTLES DRAWN AEROBIC ONLY 5CC   Final    Culture  Setup Time 05/21/2012 04:48   Final    Culture     Final    Value:        BLOOD CULTURE RECEIVED NO GROWTH TO DATE CULTURE WILL BE HELD FOR 5 DAYS BEFORE ISSUING A FINAL NEGATIVE REPORT   Report Status PENDING   Incomplete   MRSA PCR SCREENING     Status: Normal   Collection Time   05/21/12  4:58 AM      Component Value Range Status Comment   MRSA by PCR NEGATIVE  NEGATIVE Final   CULTURE,  BLOOD (ROUTINE X 2)     Status: Normal (Preliminary result)   Collection Time   05/21/12  5:13 AM      Component Value Range Status Comment   Specimen Description BLOOD RIGHT HAND   Final    Special Requests BOTTLES DRAWN AEROBIC ONLY 2 CC   Final    Culture  Setup Time 05/21/2012 11:12   Final    Culture     Final    Value:        BLOOD CULTURE RECEIVED NO GROWTH TO DATE CULTURE WILL BE HELD FOR 5 DAYS BEFORE ISSUING A FINAL NEGATIVE REPORT   Report Status PENDING   Incomplete  Studies: Dg Chest Portable 1 View  05/20/2012  *RADIOLOGY REPORT*  Clinical Data: Fever  PORTABLE CHEST - 1 VIEW  Comparison: 05/03/2012  Findings: Heart size upper normal.  Aortic arch atherosclerosis. Interstitial prominence.  Mild retrocardiac opacity.  No pleural effusion or pneumothorax.  No acute osseous finding. Previously described right chest wall mass has decreased in size.  IMPRESSION: Mild retrocardiac opacity; atelectasis versus early infiltrate.   Original Report Authenticated By: Waneta Martins, M.D.    Dg Chest Portable 1 View  05/03/2012  *RADIOLOGY REPORT*  Clinical Data: Fall, tachycardia with palpitations  PORTABLE CHEST - 1 VIEW  Comparison: CT thorax 04/06/2012  Findings: Mild enlarged heart silhouette.  There is no effusion, infiltrate, or pneumothorax.  Mild central venous congestion is present.  Right chest wall mass is decreased in size from comparison CT.  IMPRESSION:  1.  No acute cardiopulmonary findings. 2.  Decrease in volume of right chest wall mass.  Original Report Authenticated By: Genevive Bi, M.D.    Scheduled Meds:    . acyclovir  400 mg Oral Daily  . atorvastatin  20 mg Oral q1800  . calcium-vitamin D  1 tablet Oral BID  . diltiazem  180 mg Oral Daily  . enoxaparin (LOVENOX) injection  40 mg Subcutaneous Q24H  . fentaNYL  12.5 mcg Transdermal Q72H  . fentaNYL  25 mcg Transdermal Q72H  . gabapentin  300 mg Oral BID  . lenalidomide  15 mg Oral Daily  .  levofloxacin  750 mg Oral Daily  . polyethylene glycol powder  17 g Oral BID  . potassium chloride  40 mEq Oral Once  . QUEtiapine  100 mg Oral Daily  . QUEtiapine  75 mg Oral QHS  . senna-docusate  1 tablet Oral Daily  . sodium chloride  3 mL Intravenous Q12H  . vitamin A & D      . DISCONTD: piperacillin-tazobactam  3.375 g Intravenous Q8H  . DISCONTD: vancomycin  750 mg Intravenous Q12H  . DISCONTD: vancomycin  1,000 mg Intravenous Q12H   Continuous Infusions:    . DISCONTD: dextrose 5 % and 0.9% NaCl 125 mL/hr at 05/23/12 0846    Time spent: 20 mins   [facility]  [facility] Pager [phone]. If 8PM-8AM, please contact night-coverage at [email or web address], password [facility] 05/23/2012, 3:22 PM  LOS: 3 days

## 2012-05-24 ENCOUNTER — Encounter: Payer: Medicare Other | Admitting: Physical Medicine & Rehabilitation

## 2012-05-24 DIAGNOSIS — E876 Hypokalemia: Secondary | ICD-10-CM | POA: Diagnosis not present

## 2012-05-24 LAB — BASIC METABOLIC PANEL
BUN: 3 mg/dL — ABNORMAL LOW (ref 6–23)
CO2: 25 mEq/L (ref 19–32)
Chloride: 105 mEq/L (ref 96–112)
Creatinine, Ser: 0.67 mg/dL (ref 0.50–1.10)
Potassium: 3.3 mEq/L — ABNORMAL LOW (ref 3.5–5.1)

## 2012-05-24 LAB — CBC
HCT: 25.4 % — ABNORMAL LOW (ref 36.0–46.0)
MCV: 90.4 fL (ref 78.0–100.0)
Platelets: 129 10*3/uL — ABNORMAL LOW (ref 150–400)
RBC: 2.81 MIL/uL — ABNORMAL LOW (ref 3.87–5.11)
WBC: 4.3 10*3/uL (ref 4.0–10.5)

## 2012-05-24 LAB — MAGNESIUM: Magnesium: 1.7 mg/dL (ref 1.5–2.5)

## 2012-05-24 MED ORDER — FENTANYL 25 MCG/HR TD PT72: 1.0000 | MEDICATED_PATCH | TRANSDERMAL | Status: AC

## 2012-05-24 MED ORDER — POTASSIUM CHLORIDE CRYS ER 20 MEQ PO TBCR
40.0000 meq | EXTENDED_RELEASE_TABLET | Freq: Once | ORAL | Status: AC
  Administered 2012-05-24: 40 meq via ORAL
  Filled 2012-05-24: qty 2

## 2012-05-24 MED ORDER — LEVOFLOXACIN 750 MG PO TABS: 750.0000 mg | ORAL_TABLET | Freq: Every day | ORAL | Status: AC

## 2012-05-24 MED ORDER — FENTANYL 12 MCG/HR TD PT72: 1.0000 | MEDICATED_PATCH | TRANSDERMAL | Status: AC

## 2012-05-24 MED ORDER — HYDROCODONE-ACETAMINOPHEN 5-500 MG PO TABS: 1.0000 | ORAL_TABLET | Freq: Four times a day (QID) | ORAL | Status: DC | PRN

## 2012-05-24 MED ORDER — ALPRAZOLAM 1 MG PO TABS: 1.0000 mg | ORAL_TABLET | Freq: Every evening | ORAL | Status: DC | PRN

## 2012-05-24 NOTE — Progress Notes (Signed)
Patient is set to discharge back to [facility] today. Patient & husband at bedside made aware. [facility] called for 1:00p pickup. Chart copy in [place] with phone # for RN to call report.   Unice Bailey, LCSW [facility] Clinical Social Worker cell #: [phone]

## 2012-05-24 NOTE — Discharge Summary (Signed)
Physician Discharge Summary  [name] WUJ:[id number] DOB: [birth year] DOA: 05/20/2012  PCP: [name], MD  Admit date: 05/20/2012 Discharge date: 05/24/2012  Recommendations for Outpatient Follow-up:  1. Followup with primary oncologist. 2. Followup with primary care physician. 3. Recommend followup of repeat chest x-ray in 3-4 weeks to ensure resolution of possible pneumonia.  Discharge Diagnoses:  Principal Problem:  *Fever Active Problems:  HYPERLIPIDEMIA NEC/NOS  DEPRESSION  HYPERTENSION  Anemia  Multiple myeloma  New onset atrial fibrillation  PNA (pneumonia)  UTI (lower urinary tract infection)  Hypokalemia   Discharge Condition: Improved and stable.  Diet recommendation: Heart healthy diet.  Filed Weights   05/21/12 0245  Weight: 80.8 kg (178 lb 2.1 oz)    History of present illness:  76 y.o. Female, currently resident of SNF, with history of recently Dx multiple myeloma, began chemotherapy treatment on 8/22, Hx of HTN, depression, prior hx of subdural hematoma, presented to the [facility] ER complaining of feeling fatigued and weak. She was evaluated in the ER and found to have a CXR showing retrocardiac infiltrate vs atelectasis, WBC was 7.6 K, UA showed moderate leukocytes and 7-10 WBC's and anemia with Hb of 7.6.  Hospital Course:  1. Possible HCAP: Patient was admitted to the hospital and started empirically on IV vancomycin and Zosyn. After the first 48-72 hours, she was switched to oral Levaquin on 8/26. Blood cultures are negative to date. No further fevers. Asymptomatic. Complete total 7 days of Levaquin on 05/29/2012. Recommend repeating chest x-ray in a couple of weeks to ensure resolution of possible pneumonia. 2. Possible urinary tract infection: Urine culture is suggestive of contamination. Complete oral levofloxacin as above. 3. Febrile illness: Secondary to problems #1 and 2 and management as above. Has been afebrile for greater than 48  hours. 4. Anemia: Secondary to multiple myeloma and chemotherapy. Status post 1 unit PRBC on 8/24 with appropriate response. Hemoglobin stable. Outpatient followup with repeat CBCs with primary oncologist. 5. Thrombocytopenia: Possibly from chemotherapy. Improved/stable. Outpatient followup with repeat CBCs with primary oncologist. No bleeding. 6. Paroxysmal atrial fibrillation: Patient was in A. fib in the ED and reverted to sinus rhythm prior to admission to floor. 2 nights back she was transiently in A. fib and again reverted to A. fib spontaneously. She is followed by [facility] cardiology for this as an outpatient. She has been determined not a safe candidate for anticoagulation secondary to her multiple myeloma and associated treatment. May consider aspirin as outpatient-will defer to primary cardiologist. TSH normal and recent echo shows LVEF 60-65%. Remains in sinus rhythm. 7. Multiple myeloma: Continue home chemotherapy regimen. Discussed with Dr. Gaylyn Rong on 8/26: Continue Revlimid and okay to hold Bactrim at this time. Bactrim is currently on hold until she follows up with her primary oncologist. 8. Hypertension: Controlled. Continue Cardizem. 9. Altered mental status: Likely secondary to Seroquel. Resolved.  Procedures:  None.  Consultations:  None.  Discharge Exam:  Complaints: Patient denies complaints. She denies cough, dyspnea or chest pain. No abdominal pain, dysuria or urinary frequency. No fevers. Denies pain.   Filed Vitals:   05/24/12 0521  BP: 114/76  Pulse: 73  Temp: 98 F (36.7 C)  Resp: 18   Filed Vitals:   05/23/12 0630 05/23/12 1357 05/23/12 2058 05/24/12 0521  BP: 92/61 99/65 120/78 114/76  Pulse: 68 79 78 73  Temp: 97.4 F (36.3 C) 98.2 F (36.8 C) 98.3 F (36.8 C) 98 F (36.7 C)  TempSrc: Axillary Oral Oral Oral  Resp: 19  18 16 18   Height:      Weight:      SpO2: 95% 95% 96% 94%    General exam: Comfortable and in no obvious distress.  Respiratory  system: Clear to auscultation. No increased work of breathing.  Cardiovascular system: First and second heart sounds heard, regular. No JVD, murmurs or pedal edema. Telemetry shows sinus rhythm.  Gastrointestinal system: Abdomen is nondistended, soft and normal bowel sounds heard.  Central nervous system: Alert and oriented x3. No focal neurological deficits.  Extremities: Symmetric 5 x 5 power.   Discharge Instructions  Discharge Orders    Future Appointments: Provider: Department: Dept Phone: Center:   05/26/2012 12:15 PM Windell Hummingbird [facility] [phone] None   05/26/2012 12:45 PM [facility] [phone] None   06/02/2012 1:30 PM Dava Najjar Idelle Jo [facility] [phone] None   06/02/2012 2:00 PM [facility] [phone] None   06/09/2012 2:00 PM Dava Najjar Idelle Jo [facility] [phone] None   06/16/2012 11:15 AM Myrtis Ser, NP [facility] [phone] None   06/16/2012 12:15 PM [facility] [phone] None   06/17/2012 10:15 AM Jonna Coup, MD [facility] [phone] None   06/23/2012 2:15 PM [facility] A1 [facility] [phone] None   06/30/2012 2:15 PM [facility] [phone] None   07/14/2012 10:45 AM [facility] [phone] None   07/21/2012 12:00 PM [facility] [phone] None   07/28/2012 2:15 PM [facility] [phone] None   08/11/2012 2:15 PM [facility] A[facility] [phone] None   09/08/2012 1:45 PM [facility] [phone] None   10/06/2012 2:00 PM [facility] [phone] None     Future Orders Please Complete By Expires   Diet - low sodium heart healthy      Increase activity slowly      Call MD for:  temperature >100.4      Call MD for:  severe uncontrolled pain      Call MD for:  difficulty breathing, headache  or visual disturbances      Call MD for:  persistant dizziness or light-headedness      Call MD for:  extreme fatigue        Medication List  As of 05/24/2012 10:58 AM   STOP taking these medications         lidocaine 5 %      oxyCODONE 5 MG immediate release tablet      oxyCODONE-acetaminophen 7.5-325 MG per tablet      sulfamethoxazole-trimethoprim 800-160 MG per tablet      traMADol 50 MG tablet         TAKE these medications         acetaminophen 325 MG tablet   Commonly known as: TYLENOL   Take 650 mg by mouth every 4 (four) hours as needed.      acyclovir 400 MG tablet   Commonly known as: ZOVIRAX   Take 400 mg by mouth daily.      ALPRAZolam 1 MG tablet   Commonly known as: XANAX   Take 1 tablet (1 mg total) by mouth at bedtime as needed for sleep. Sleep.      calcium-vitamin D 500-200 MG-UNIT per tablet   Commonly known as: OSCAL WITH D   Take 1 tablet by mouth 2 (two) times daily.      cyclobenzaprine 5 MG tablet   Commonly known as: FLEXERIL  Take 5 mg by mouth 3 (three) times daily as needed. Muscle pain      dexamethasone 4 MG tablet   Commonly known as: DECADRON   Take 40 mg by mouth every Tuesday. with chemotherapy drug      diltiazem 180 MG 24 hr capsule   Commonly known as: CARDIZEM CD   Take 1 capsule (180 mg total) by mouth daily.      enoxaparin 40 MG/0.4ML injection   Commonly known as: LOVENOX   Inject 0.4 mLs (40 mg total) into the skin daily.      fentaNYL 12 MCG/HR   Commonly known as: DURAGESIC - dosed mcg/hr   Place 1 patch (12.5 mcg total) onto the skin every 3 (three) days.      fentaNYL 25 MCG/HR   Commonly known as: DURAGESIC - dosed mcg/hr   Place 1 patch (25 mcg total) onto the skin every 3 (three) days.      gabapentin 300 MG capsule   Commonly known as: NEURONTIN   Take 300 mg by mouth 2 (two) times daily.      HYDROcodone-acetaminophen 5-500 MG per tablet   Commonly known as: VICODIN   Take 1 tablet by mouth every  6 (six) hours as needed for pain. pain      lenalidomide 15 MG capsule   Commonly known as: REVLIMID   Take 1 capsule (15 mg total) by mouth daily.      levofloxacin 750 MG tablet   Commonly known as: LEVAQUIN   Take 1 tablet (750 mg total) by mouth daily. Discontinue after 05/29/2012 dose.      ondansetron 4 MG tablet   Commonly known as: ZOFRAN   Take 4 mg by mouth. Take 1 tablet every 8 hours as needed for nausea      polyethylene glycol powder powder   Commonly known as: GLYCOLAX/MIRALAX   Take 17 g by mouth 2 (two) times daily. Take as directed      prochlorperazine 10 MG tablet   Commonly known as: COMPAZINE   Take 10 mg by mouth. Take 1 tablet every 6 hours as directed      QUEtiapine 100 MG tablet   Commonly known as: SEROQUEL   Take 100 mg by mouth daily.      QUEtiapine 25 MG tablet   Commonly known as: SEROQUEL   Take 75 mg by mouth at bedtime.      rosuvastatin 10 MG tablet   Commonly known as: CRESTOR   Take 10 mg by mouth at bedtime.      sennosides-docusate sodium 8.6-50 MG tablet   Commonly known as: SENOKOT-S   Take 1 tablet by mouth daily.           Follow-up Information    Schedule an appointment as soon as possible for a visit with [name], MD.   Contact information:   [address] [place] [address] [phone]       Follow up with METHENEY,CATHERINE, MD. Schedule an appointment as soon as possible for a visit in 1 week.   Contact information:   [address]  [address]  [place] [address] [phone]           The results of significant diagnostics from this hospitalization (including imaging, microbiology, ancillary and laboratory) are listed below for reference.    Significant Diagnostic Studies: Dg Chest Portable 1 View  05/20/2012  *RADIOLOGY REPORT*  Clinical Data: Fever  PORTABLE CHEST -  1 VIEW  Comparison: 05/03/2012  Findings: Heart size upper normal.  Aortic arch atherosclerosis.  Interstitial prominence.  Mild retrocardiac opacity.  No pleural effusion or pneumothorax.  No acute osseous finding. Previously described right chest wall mass has decreased in size.  IMPRESSION: Mild retrocardiac opacity; atelectasis versus early infiltrate.   Original Report Authenticated By: Waneta Martins, M.D.    Dg Chest Portable 1 View  05/03/2012  *RADIOLOGY REPORT*  Clinical Data: Fall, tachycardia with palpitations  PORTABLE CHEST - 1 VIEW  Comparison: CT thorax 04/06/2012  Findings: Mild enlarged heart silhouette.  There is no effusion, infiltrate, or pneumothorax.  Mild central venous congestion is present.  Right chest wall mass is decreased in size from comparison CT.  IMPRESSION:  1.  No acute cardiopulmonary findings. 2.  Decrease in volume of right chest wall mass.  Original Report Authenticated By: Genevive Bi, M.D.    Microbiology: Recent Results (from the past 240 hour(s))  URINE CULTURE     Status: Normal   Collection Time   05/20/12 11:10 PM      Component Value Range Status Comment   Specimen Description URINE, CLEAN CATCH   Final    Special Requests NONE   Final    Culture  Setup Time 05/21/2012 04:41   Final    Colony Count 20,OOO COLONIES/ML   Final    Culture     Final    Value: Multiple bacterial morphotypes present, none predominant. Suggest appropriate recollection if clinically indicated.   Report Status 05/22/2012 FINAL   Final   CULTURE, BLOOD (ROUTINE X 2)     Status: Normal (Preliminary result)   Collection Time   05/20/12 11:38 PM      Component Value Range Status Comment   Specimen Description BLOOD RIGHT HAND   Final    Special Requests BOTTLES DRAWN AEROBIC ONLY 5CC   Final    Culture  Setup Time 05/21/2012 04:48   Final    Culture     Final    Value:        BLOOD CULTURE RECEIVED NO GROWTH TO DATE CULTURE WILL BE HELD FOR 5 DAYS BEFORE ISSUING A FINAL NEGATIVE REPORT   Report Status PENDING   Incomplete   MRSA PCR SCREENING     Status: Normal    Collection Time   05/21/12  4:58 AM      Component Value Range Status Comment   MRSA by PCR NEGATIVE  NEGATIVE Final   CULTURE, BLOOD (ROUTINE X 2)     Status: Normal (Preliminary result)   Collection Time   05/21/12  5:13 AM      Component Value Range Status Comment   Specimen Description BLOOD RIGHT HAND   Final    Special Requests BOTTLES DRAWN AEROBIC ONLY 2 CC   Final    Culture  Setup Time 05/21/2012 11:12   Final    Culture     Final    Value:        BLOOD CULTURE RECEIVED NO GROWTH TO DATE CULTURE WILL BE HELD FOR 5 DAYS BEFORE ISSUING A FINAL NEGATIVE REPORT   Report Status PENDING   Incomplete      Labs: Basic Metabolic Panel:  Lab 05/24/12 1610 05/23/12 0142 05/22/12 0459 05/21/12 0513 05/20/12 2130  NA 136 135 136 135 135  K 3.3* 3.2* 3.6 3.8 3.9  CL 105 104 103 101 100  CO2 25 25 29 25 27   GLUCOSE 82 128* 107* 125* 130*  BUN 3* 5* 6 10 11   CREATININE 0.67 0.78 0.81 0.87 0.88  CALCIUM 7.8* 7.0* 7.4* 7.4* 8.3*  MG 1.7 -- -- -- --  PHOS -- -- -- -- --   Liver Function Tests:  Lab 05/20/12 2130  AST 9  ALT 10  ALKPHOS 179*  BILITOT 0.2*  PROT 5.2*  ALBUMIN 2.2*   No results found for this basename: LIPASE:5,AMYLASE:5 in the last 168 hours No results found for this basename: AMMONIA:5 in the last 168 hours CBC:  Lab 05/24/12 0422 05/23/12 0142 05/22/12 0459 05/21/12 2119 05/21/12 1000 05/21/12 0513 05/20/12 2130  WBC 4.3 4.5 4.7 -- -- 7.6 7.3  NEUTROABS -- 3.3 -- -- -- -- 6.0  HGB 8.5* 8.1* 7.9* 8.3* 7.1* -- --  HCT 25.4* 24.5* 24.6* 24.9* 22.2* -- --  MCV 90.4 90.1 91.8 -- -- 94.1 93.7  PLT 129* 131* 119* -- -- 158 199   Cardiac Enzymes: No results found for this basename: CKTOTAL:5,CKMB:5,CKMBINDEX:5,TROPONINI:5 in the last 168 hours BNP: BNP (last 3 results)  Basename 05/03/12 1114  PROBNP 853.3*   CBG: No results found for this basename: GLUCAP:5 in the last 168 hours  Other lab data:  1. Urine analysis: Significant for 7-10 white blood  cells, few bacteria and few squamous epithelial cells.  Time coordinating discharge: Greater than 30 minutes  Signed:  Donelle Hise  [facility] 05/24/2012, 10:58 AM

## 2012-05-26 ENCOUNTER — Other Ambulatory Visit (HOSPITAL_BASED_OUTPATIENT_CLINIC_OR_DEPARTMENT_OTHER): Payer: Medicare Other | Admitting: Lab

## 2012-05-26 ENCOUNTER — Ambulatory Visit (HOSPITAL_BASED_OUTPATIENT_CLINIC_OR_DEPARTMENT_OTHER): Payer: Medicare Other

## 2012-05-26 ENCOUNTER — Telehealth: Payer: Self-pay | Admitting: Cardiology

## 2012-05-26 VITALS — BP 117/57 | HR 97 | Temp 97.5°F

## 2012-05-26 DIAGNOSIS — Z5112 Encounter for antineoplastic immunotherapy: Secondary | ICD-10-CM

## 2012-05-26 DIAGNOSIS — C9000 Multiple myeloma not having achieved remission: Secondary | ICD-10-CM

## 2012-05-26 LAB — CBC WITH DIFFERENTIAL/PLATELET
Basophils Absolute: 0 10*3/uL (ref 0.0–0.1)
EOS%: 8.3 % — ABNORMAL HIGH (ref 0.0–7.0)
Eosinophils Absolute: 0.5 10*3/uL (ref 0.0–0.5)
HCT: 29.7 % — ABNORMAL LOW (ref 34.8–46.6)
HGB: 9.7 g/dL — ABNORMAL LOW (ref 11.6–15.9)
MCH: 29.2 pg (ref 25.1–34.0)
NEUT#: 4.6 10*3/uL (ref 1.5–6.5)
NEUT%: 74.3 % (ref 38.4–76.8)
lymph#: 0.6 10*3/uL — ABNORMAL LOW (ref 0.9–3.3)

## 2012-05-26 MED ORDER — ONDANSETRON HCL 8 MG PO TABS
8.0000 mg | ORAL_TABLET | Freq: Once | ORAL | Status: AC
  Administered 2012-05-26: 8 mg via ORAL

## 2012-05-26 MED ORDER — BORTEZOMIB CHEMO SQ INJECTION 3.5 MG (2.5MG/ML)
1.3000 mg/m2 | Freq: Once | INTRAMUSCULAR | Status: AC
  Administered 2012-05-26: 2.5 mg via SUBCUTANEOUS
  Filled 2012-05-26: qty 2.5

## 2012-05-26 NOTE — Patient Instructions (Signed)
ROSEANN KEES Aug 27, 1936 [id number]  [facility] Discharge Instructions for Patients Receiving Chemotherapy  Today you received the following chemotherapy agents: Velcade   To help prevent nausea and vomiting after your treatment, we encourage you to take your nausea medication as prescribed; if you develop nausea and vomiting that is not controlled by your nausea medication, call the clinic.   BELOW ARE SYMPTOMS THAT SHOULD BE REPORTED IMMEDIATELY:  *FEVER GREATER THAN 100.5 F *CHILLS WITH OR WITHOUT FEVER *UNUSUAL SHORTNESS OF BREATH *UNUSUAL BRUISING OR BLEEDING *URINARY PROBLEMS *BOWEL PROBLEMS  I have been informed and understand all the instructions given to me.   Please call the [facility] at [phone] during business hours should you have any further questions or need assistance in obtaining follow-up care. If you have a medical emergency, please dial 911.

## 2012-05-26 NOTE — Telephone Encounter (Signed)
Pt husband rtn call from yesterday re test results

## 2012-05-26 NOTE — Telephone Encounter (Signed)
I did not call pt. This is Dr Fabio Bering pt . I will forward.

## 2012-05-26 NOTE — Telephone Encounter (Signed)
PT AND PT'S HUSBAND AWARE OF ECHO RESULTS .Zack Seal

## 2012-05-27 ENCOUNTER — Telehealth: Payer: Self-pay | Admitting: *Deleted

## 2012-05-27 ENCOUNTER — Ambulatory Visit (HOSPITAL_BASED_OUTPATIENT_CLINIC_OR_DEPARTMENT_OTHER): Payer: Medicare Other | Admitting: Oncology

## 2012-05-27 ENCOUNTER — Ambulatory Visit (HOSPITAL_BASED_OUTPATIENT_CLINIC_OR_DEPARTMENT_OTHER): Payer: Medicare Other | Admitting: Lab

## 2012-05-27 VITALS — BP 100/66 | HR 84 | Temp 98.0°F | Resp 20 | Ht 67.0 in | Wt 175.0 lb

## 2012-05-27 DIAGNOSIS — M899 Disorder of bone, unspecified: Secondary | ICD-10-CM

## 2012-05-27 DIAGNOSIS — C9000 Multiple myeloma not having achieved remission: Secondary | ICD-10-CM

## 2012-05-27 DIAGNOSIS — R410 Disorientation, unspecified: Secondary | ICD-10-CM

## 2012-05-27 DIAGNOSIS — R300 Dysuria: Secondary | ICD-10-CM

## 2012-05-27 DIAGNOSIS — D649 Anemia, unspecified: Secondary | ICD-10-CM

## 2012-05-27 DIAGNOSIS — F29 Unspecified psychosis not due to a substance or known physiological condition: Secondary | ICD-10-CM

## 2012-05-27 LAB — CULTURE, BLOOD (ROUTINE X 2)

## 2012-05-27 LAB — URINALYSIS, MICROSCOPIC - CHCC
Bilirubin (Urine): NEGATIVE
Glucose: NEGATIVE g/dL
Ketones: NEGATIVE mg/dL
Specific Gravity, Urine: 1.03 (ref 1.003–1.035)
pH: 5 (ref 4.6–8.0)

## 2012-05-27 NOTE — Telephone Encounter (Signed)
Husband called to report pt has new onset confusion/disorientation over past 2 days.  Says she is "talking out of her head."  Someone at SNF mentioned pt may have a UTI?  Instructed husband to contact nurses at SNF to contact MD or NP regarding pt's symptoms.  Informed symptoms not likely r/t chemotherapy or pt's cancer.  More likely due to infection, possibly the UTI?  Encouraged husband to talk to staff at [facility] regarding these symptoms.  He verbalized understanding.  Notified Dr. Gaylyn Rong of above and he requests pt f/u w/ Clenton Pare, NP next week if possible.

## 2012-05-27 NOTE — Patient Instructions (Addendum)
You were seen today for increased confusion.  Suspect that this is medication-induced. In review of your records, your Seroquel dose has been increased. We recommend reducing the Seroquel to the lowest effective dose. At this time, reduce your Seroquel to 50 mg in the morning and 50 mg at bedtime if this is okay with your doctor at the SNF.  We have also check a urinalysis and a urine culture today. If there evidence of a UTI, then we will call in an antibiotic for you.

## 2012-05-27 NOTE — Telephone Encounter (Signed)
Regarding pt's confusion;  Dr. Gaylyn Rong wants pt to be seen by Clenton Pare, NP today.  Belenda Cruise agrees to see pt at 1:45pm.   Called pt's husband and he will bring pt in this afternoon at 1:45 pm.   Called Melissa in scheduling to add pt to schedule today.   POF sent.

## 2012-05-28 ENCOUNTER — Encounter: Payer: Self-pay | Admitting: Oncology

## 2012-05-28 NOTE — Progress Notes (Signed)
[facility]  [phone] [phone]   OFFICE PROGRESS NOTE   Cc:  METHENEY,CATHERINE, MD   DIAGNOSIS: IgA kappa multiple myeloma. She presented with diffuse lytic bone lesions, renal insufficiency, hypercalcemia, anemia. Her M-spike was 4.06 gm/dL. Her IgA was elevated at 4,020 mg/dL; IgG 353; IgM 5; serum kappa was 2.11; lambda 0.60; serum kappa to lambda ratio elevated at 3.06. Spot urine showed elevated kappa:lambda at 579; and positive for Bence Jones protein. Bone marrow biopsy on 04/08/2012 showed 92% plasma cell. Cytogenetics showed multiple chromosome 1 abnormalities; additional chromosomal material on chromosome 8p; 14q; loss of chromosome 8 and 13. 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   CURRENT THERAPY: Started on SQ Velcade 1.3mg /m2 on 04/12/2012; weekly; 3 weeks on; 1 week off. She started on Revlimid 15mg  PO daily d1-21 on 04/21/2012. She is also on Dexamethasone 40mg  PO once weekly.  She also received palliative radiation 20 Gy in 8 fractions to the bilateral hips.    INTERVAL HISTORY: [name] 76 y.o. female returns for a work-in appointment with her husband and son. She is seen for increased confusion. The patient was recently admitted to the hospital for pneumonia and discharged about 2 days ago to [facility] for rehabilitation. He husband reports that she has become more confused over the past 24-48 hours to the point that therapy could not work with her due to the concern for her safety. Her family tells me that they are not aware of any fevers. She remains on Levaquin for her pneumonia. The patient denies headaches, visual changes, chest pain, shortness of breath, abdominal pain, nausea, and vomiting. She has mild fatigue; however, this is better than before. No neurological changes have been noted. No bleeding has been noted. Pain is controlled with her Fentanyl patches. I am not sure how much Tylenol or Vicodin she  is taking at this time.  Patient denies fever, anorexia, headache, visual changes, drenching night sweats, palpable lymph node swelling, mucositis, odynophagia, dysphagia, nausea vomiting, jaundice, chest pain, palpitation, shortness of breath, dyspnea on exertion, productive cough, gum bleeding, epistaxis, hematemesis, hemoptysis, abdominal pain, abdominal swelling, early satiety, melena, hematochezia, hematuria, skin rash, spontaneous bleeding,  heat or cold intolerance, bowel bladder incontinence, focal motor weakness, paresthesia, depression, suicidal or homicidal ideation, feeling hopelessness.   Past Medical History  Diagnosis Date  . Anxiety   . Hypertension   . Hyperlipidemia   . Depression   . Osteopenia   . Multiple myeloma 03/2012  . Acute renal failure      due to hypercalcemia and suspected multiple myeloma resolved =with IVF's    . Lytic bone lesions on xray   . Acute subdural hematoma 03/2012     recent fall in hospital stay,resolved+  . Normocytic anemia     due to chemo and multiple myeloma,no active bleding  . Cancer     mul;tiple myeloma, bone lesions r 7th rib,   . Arthritis     spine  . Atrial fibrillation     Past Surgical History  Procedure Date  . Breast lumpectomy   . 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 Tonsillectomy     age 31    Current Outpatient Prescriptions  Medication Sig Dispense Refill  . acetaminophen (TYLENOL) 325 MG tablet Take 650 mg by mouth every 4 (four) hours as needed.       Marland Kitchen acyclovir (ZOVIRAX)  400 MG tablet Take 400 mg by mouth daily.      Marland Kitchen ALPRAZolam (XANAX) 1 MG tablet Take 1 tablet (1 mg total) by mouth at bedtime as needed for sleep. Sleep.  5 tablet  0  . calcium-vitamin D (OSCAL WITH D) 500-200 MG-UNIT per tablet Take 1 tablet by mouth 2 (two) times daily.      . cyclobenzaprine (FLEXERIL) 5 MG tablet Take 5 mg by mouth 3  (three) times daily as needed. Muscle pain      . dexamethasone (DECADRON) 4 MG tablet Take 40 mg by mouth every Tuesday. with chemotherapy drug      . diltiazem (CARDIZEM CD) 180 MG 24 hr capsule Take 1 capsule (180 mg total) by mouth daily.  30 capsule  2  . enoxaparin (LOVENOX) 40 MG/0.4ML injection Inject 0.4 mLs (40 mg total) into the skin daily.  30 Syringe  3  . fentaNYL (DURAGESIC - DOSED MCG/HR) 12 MCG/HR Place 1 patch (12.5 mcg total) onto the skin every 3 (three) days.  5 patch  0  . fentaNYL (DURAGESIC - DOSED MCG/HR) 25 MCG/HR Place 1 patch (25 mcg total) onto the skin every 3 (three) days.  5 patch  0  . gabapentin (NEURONTIN) 300 MG capsule Take 300 mg by mouth 2 (two) times daily.      Marland Kitchen HYDROcodone-acetaminophen (VICODIN) 5-500 MG per tablet Take 1 tablet by mouth every 6 (six) hours as needed for pain. pain  10 tablet  0  . lenalidomide (REVLIMID) 15 MG capsule Take 1 capsule (15 mg total) by mouth daily.  15 capsule  0  . levofloxacin (LEVAQUIN) 750 MG tablet Take 1 tablet (750 mg total) by mouth daily. Discontinue after 05/29/2012 dose.  5 tablet  0  . ondansetron (ZOFRAN) 4 MG tablet Take 4 mg by mouth. Take 1 tablet every 8 hours as needed for nausea      . polyethylene glycol powder (GLYCOLAX/MIRALAX) powder Take 17 g by mouth 2 (two) times daily. Take as directed      . prochlorperazine (COMPAZINE) 10 MG tablet Take 10 mg by mouth. Take 1 tablet every 6 hours as directed      . QUEtiapine (SEROQUEL) 100 MG tablet Take 100 mg by mouth daily.      . QUEtiapine (SEROQUEL) 25 MG tablet Take 75 mg by mouth at bedtime.      . rosuvastatin (CRESTOR) 10 MG tablet Take 10 mg by mouth at bedtime.       . sennosides-docusate sodium (SENOKOT-S) 8.6-50 MG tablet Take 1 tablet by mouth daily.        ALLERGIES:  is allergic to simvastatin.  REVIEW OF SYSTEMS:  The rest of the 14-point review of system was negative.   Filed Vitals:   05/27/12 1343  BP: 100/66  Pulse: 84  Temp: 98 F  (36.7 C)  Resp: 20   Wt Readings from Last 3 Encounters:  05/27/12 175 lb (79.379 kg)  05/21/12 178 lb 2.1 oz (80.8 kg)  05/19/12 177 lb 9.6 oz (80.559 kg)   ECOG Performance status: 1-2  PHYSICAL EXAMINATION:   General: well-nourished woman in no acute distress. She is in a wheelchair today. Eyes: no scleral icterus. ENT: There were no oropharyngeal lesions. Neck was without thyromegaly. Lymphatics: Negative cervical, supraclavicular or axillary adenopathy. Respiratory: lungs were clear bilaterally without wheezing or crackles. Cardiovascular: Regular rate and rhythm, S1/S2, without murmur, rub or gallop. There was no pedal edema. GI: abdomen was soft,  flat, nontender, nondistended, without organomegaly. Muscoloskeletal: no spinal tenderness of palpation of vertebral spine. Skin exam was without echymosis, petichae. Neuro exam was nonfocal. Patient was alerted and oriented to person, place, and time. She was confused today about why she was here and thought that the visit was for her husband and could not understand why I was asking her questions. Language was appropriate. Mood was normal without depression. Speech was not pressured.    LABORATORY/RADIOLOGY DATA:  Lab Results  Component Value Date   WBC 6.3 05/26/2012   HGB 9.7* 05/26/2012   HCT 29.7* 05/26/2012   PLT 169 05/26/2012   GLUCOSE 82 05/24/2012   CHOL 255* 03/03/2012   TRIG 117 03/03/2012   HDL 62 03/03/2012   LDLDIRECT 166* 04/24/2011   LDLCALC 170* 03/03/2012   ALKPHOS 179* 05/20/2012   ALT 10 05/20/2012   AST 9 05/20/2012   NA 136 05/24/2012   K 3.3* 05/24/2012   CL 105 05/24/2012   CREATININE 0.67 05/24/2012   BUN 3* 05/24/2012   CO2 25 05/24/2012   INR 1.38 05/03/2012   HGBA1C 6.6* 05/03/2012    ASSESSMENT AND PLAN:   1. Hypertension: She is on diltiazem per cardiology with good control.  2. Hyperlipidemia: She is on rosuvastatin per PCP.   3. Acute renal failure due to hypercalcemia and suspected multiple myeloma: resolved  with IV fluid. Her Cr is now normal.   4. Hypercalcemia: Due to multiple myeloma.  Rresolved with IVF and pamidronate during admission early July 2013. Her Ca is now normal. Receiving Zometa monthly.  5. Moderate deconditioning due to bone pain: Pain is improved with Fentanyl patches. She has Tylenol or Vicodin PRN. She is now in SNF for therapy.  I strongly urged her to adhere to rehab activities.   6. Lytic bone lesions causing bone pain: much improved with radiation.  I will continue with monthly Zometa.   7. Normocytic anemia: work up in the recent past did not show iron/VitB12 deficiency or hypothyroidism. Her anemia is due to myeloma and chemo. There is no active bleeding. There is no indication for transfusion.   8. Acute subdural hematoma from recent fall during hospital stay in July 2013: Resolved. She is finished with Prednisone taper.   9. Nausea: improved.  She has Compazine and Zofran prn.   10. Moderate calorie/protein malnutrition: From myeloma, and its treatment. This is also improved.   I advised her to take Zofran to allow her to tolerate PO intake better.   11. Multiple myeloma:  S/p 2 cycles of chemo with every good partial response with M-spike decreased from 4 gm/dL now to 0.7 gm/dL.  She does not have dose limiting toxicity.  She has finished radiation.  She will continue without dose modification.  Mrs.   * Treatment:  - Velcade 1.3mg /m2 SQ once a week (3 weeks on, 1 week off) and oral Revlmid 15mg  PO days 1-21 (with 7 days off)  She is to continue with oral Dexamethasone 40mg  PO once a week.  - Continue Acyclovir 400 mg, by mouth, once daily, everyday (to prevent viral infection).  - Bactrim double strength (DS), every Monday, Wednesday, Friday (to prevent bacterial infection). This was stopped during her recent hospitalization and will discuss resuming at her next visit. - Continue Lovenox injection 40mg  , subcutaneously; everyday (to prevent blood clot).   12.  Confusion: Etiology unclear. I don not see any neurological deficits on exam. I do not think this is from her chemotherapy. Differentials  include polypharmacy versus infection. I have noted that she is on Seroquel 100 mg in the am and 75 mg at HS. Review of her records indicated that she was typically on 75-100 mg at bedtime only. There was also suggestion in her recent discharge summary that she was confused and it was thought to be due to her Seroquel. My recommendation is to reduce her Seroquel dosing to the lowest effective dose. I note that she has a diagnosis of depression and anxiety and would recommend getting her off the Seroquel completely and considering an SSRI. I have also obtained a U/A C&S today to rule out a UTI. I have discussed my recommendations with DJ who works with Dr Eloise Harman (PCP at [facility]). She told me that Dr Eloise Harman is planning to reduce the Seroquel and will be rounding this weekend.   13. Follow up: On 8/19 as scheduled.

## 2012-05-29 LAB — URINE CULTURE

## 2012-06-02 ENCOUNTER — Other Ambulatory Visit (HOSPITAL_BASED_OUTPATIENT_CLINIC_OR_DEPARTMENT_OTHER): Payer: Medicare Other

## 2012-06-02 ENCOUNTER — Ambulatory Visit (HOSPITAL_BASED_OUTPATIENT_CLINIC_OR_DEPARTMENT_OTHER): Payer: Medicare Other

## 2012-06-02 VITALS — BP 114/78 | HR 69 | Temp 97.2°F | Resp 18

## 2012-06-02 DIAGNOSIS — C9000 Multiple myeloma not having achieved remission: Secondary | ICD-10-CM

## 2012-06-02 DIAGNOSIS — Z5112 Encounter for antineoplastic immunotherapy: Secondary | ICD-10-CM

## 2012-06-02 LAB — CBC WITH DIFFERENTIAL/PLATELET
BASO%: 0.1 % (ref 0.0–2.0)
EOS%: 9.1 % — ABNORMAL HIGH (ref 0.0–7.0)
MCH: 28.7 pg (ref 25.1–34.0)
MCHC: 32.5 g/dL (ref 31.5–36.0)
MONO#: 1.1 10*3/uL — ABNORMAL HIGH (ref 0.1–0.9)
NEUT%: 63.7 % (ref 38.4–76.8)
RBC: 3.34 10*6/uL — ABNORMAL LOW (ref 3.70–5.45)
WBC: 8.9 10*3/uL (ref 3.9–10.3)
lymph#: 1.4 10*3/uL (ref 0.9–3.3)

## 2012-06-02 LAB — BASIC METABOLIC PANEL (CC13)
BUN: 21 mg/dL (ref 7.0–26.0)
Chloride: 106 mEq/L (ref 98–107)
Creatinine: 0.8 mg/dL (ref 0.6–1.1)

## 2012-06-02 MED ORDER — ONDANSETRON HCL 8 MG PO TABS
8.0000 mg | ORAL_TABLET | Freq: Once | ORAL | Status: AC
  Administered 2012-06-02: 8 mg via ORAL

## 2012-06-02 MED ORDER — BORTEZOMIB CHEMO SQ INJECTION 3.5 MG (2.5MG/ML)
1.3000 mg/m2 | Freq: Once | INTRAMUSCULAR | Status: AC
  Administered 2012-06-02: 2.5 mg via SUBCUTANEOUS
  Filled 2012-06-02: qty 2.5

## 2012-06-02 NOTE — Patient Instructions (Addendum)
[facility] Discharge Instructions for Patients Receiving Chemotherapy  Today you received the following chemotherapy agents Velcade To help prevent nausea and vomiting after your treatment, we encourage you to take your nausea medication   Take it as often as prescribed.   If you develop nausea and vomiting that is not controlled by your nausea medication, call the clinic. If it is after clinic hours your family physician or the after hours number for the clinic or go to the Emergency Department.   BELOW ARE SYMPTOMS THAT SHOULD BE REPORTED IMMEDIATELY:  *FEVER GREATER THAN 100.5 F  *CHILLS WITH OR WITHOUT FEVER  NAUSEA AND VOMITING THAT IS NOT CONTROLLED WITH YOUR NAUSEA MEDICATION  *UNUSUAL SHORTNESS OF BREATH  *UNUSUAL BRUISING OR BLEEDING  TENDERNESS IN MOUTH AND THROAT WITH OR WITHOUT PRESENCE OF ULCERS  *URINARY PROBLEMS  *BOWEL PROBLEMS  UNUSUAL RASH Items with * indicate a potential emergency and should be followed up as soon as possible.  If this is your first treatment one of the nurses will contact you 24 hours after your treatment. Please let the nurse know about any problems that you may have experienced. Feel free to call the clinic you have any questions or concerns. The clinic phone number is [phone].   I have been informed and understand all the instructions given to me. I know to contact the clinic, my physician, or go to the Emergency Department if any problems should occur. I do not have any questions at this time, but understand that I may call the clinic during office hours   should I have any questions or need assistance in obtaining follow up care.    __________________________________________  _____________  __________ Signature of Patient or Authorized Representative            Date                   Time    __________________________________________ Nurse's Signature    

## 2012-06-09 ENCOUNTER — Other Ambulatory Visit (HOSPITAL_BASED_OUTPATIENT_CLINIC_OR_DEPARTMENT_OTHER): Payer: Medicare Other | Admitting: Lab

## 2012-06-09 ENCOUNTER — Telehealth: Payer: Self-pay | Admitting: *Deleted

## 2012-06-09 ENCOUNTER — Other Ambulatory Visit: Payer: Self-pay | Admitting: *Deleted

## 2012-06-09 DIAGNOSIS — C9000 Multiple myeloma not having achieved remission: Secondary | ICD-10-CM

## 2012-06-09 LAB — COMPREHENSIVE METABOLIC PANEL (CC13)
AST: 16 U/L (ref 5–34)
Albumin: 2.6 g/dL — ABNORMAL LOW (ref 3.5–5.0)
BUN: 20 mg/dL (ref 7.0–26.0)
CO2: 20 mEq/L — ABNORMAL LOW (ref 22–29)
Calcium: 8.1 mg/dL — ABNORMAL LOW (ref 8.4–10.4)
Chloride: 108 mEq/L — ABNORMAL HIGH (ref 98–107)
Creatinine: 0.8 mg/dL (ref 0.6–1.1)
Glucose: 117 mg/dl — ABNORMAL HIGH (ref 70–99)
Potassium: 4.1 mEq/L (ref 3.5–5.1)

## 2012-06-09 LAB — CBC WITH DIFFERENTIAL/PLATELET
Basophils Absolute: 0 10*3/uL (ref 0.0–0.1)
Eosinophils Absolute: 0 10*3/uL (ref 0.0–0.5)
HGB: 9.3 g/dL — ABNORMAL LOW (ref 11.6–15.9)
MCV: 91.5 fL (ref 79.5–101.0)
MONO#: 1 10*3/uL — ABNORMAL HIGH (ref 0.1–0.9)
MONO%: 14.3 % — ABNORMAL HIGH (ref 0.0–14.0)
NEUT#: 4.1 10*3/uL (ref 1.5–6.5)
RBC: 3.17 10*6/uL — ABNORMAL LOW (ref 3.70–5.45)
RDW: 18.3 % — ABNORMAL HIGH (ref 11.2–14.5)
WBC: 6.9 10*3/uL (ref 3.9–10.3)
lymph#: 1.8 10*3/uL (ref 0.9–3.3)

## 2012-06-09 MED ORDER — LENALIDOMIDE 15 MG PO CAPS: 15.0000 mg | ORAL_CAPSULE | Freq: Every day | ORAL | Status: DC

## 2012-06-09 NOTE — Telephone Encounter (Signed)
THIS REFILL REQUEST FOR REVLIMID WAS PLACED IN DR.HA'S ACTIVE WORK FOLDER. 

## 2012-06-09 NOTE — Telephone Encounter (Signed)
Husband called to ask about refill on Revlimid.  He thought pt to restart cycle today.  Per Clenton Pare, NP,  Pt to wait until 06/16/12 office visit to restart Revlimid.  Signed Rx sent to [facility] today for refill.   Pt to keep lab appt today and f/u in office next week 9/19.   Called husband back and instructed refill sent to [facility] but to not start Revlimid yet until after office visit on 9/19,  Plan to start on 9/19 if everything ok.  Keep lab appt this afternoon.  He verbalized understanding.

## 2012-06-09 NOTE — Telephone Encounter (Signed)
RECEIVED A FAX FROM [facility] CONCERNING A CONFIRMATION OF FACSIMILE RECEIPT FOR PT. REFERRAL. 

## 2012-06-10 ENCOUNTER — Encounter: Payer: Self-pay | Admitting: Physical Medicine & Rehabilitation

## 2012-06-10 ENCOUNTER — Encounter: Payer: Medicare Other | Attending: Physical Medicine & Rehabilitation | Admitting: Physical Medicine & Rehabilitation

## 2012-06-10 VITALS — BP 136/60 | HR 78 | Resp 16 | Ht 67.0 in | Wt 170.0 lb

## 2012-06-10 DIAGNOSIS — S065X9A Traumatic subdural hemorrhage with loss of consciousness of unspecified duration, initial encounter: Secondary | ICD-10-CM

## 2012-06-10 DIAGNOSIS — M545 Low back pain: Secondary | ICD-10-CM

## 2012-06-10 DIAGNOSIS — L989 Disorder of the skin and subcutaneous tissue, unspecified: Secondary | ICD-10-CM | POA: Insufficient documentation

## 2012-06-10 DIAGNOSIS — I6200 Nontraumatic subdural hemorrhage, unspecified: Secondary | ICD-10-CM

## 2012-06-10 DIAGNOSIS — C9000 Multiple myeloma not having achieved remission: Secondary | ICD-10-CM | POA: Insufficient documentation

## 2012-06-10 DIAGNOSIS — I10 Essential (primary) hypertension: Secondary | ICD-10-CM | POA: Insufficient documentation

## 2012-06-10 DIAGNOSIS — G893 Neoplasm related pain (acute) (chronic): Secondary | ICD-10-CM | POA: Insufficient documentation

## 2012-06-10 DIAGNOSIS — E785 Hyperlipidemia, unspecified: Secondary | ICD-10-CM | POA: Insufficient documentation

## 2012-06-10 NOTE — Progress Notes (Signed)
Subjective:    Patient ID: [name], female    DOB: [birth year], 76 y.o.   MRN: [id number]  HPI  Mrs. [name] is back regarding her MM and chronic pain. Her main pain sources are the thighs and the  Buttocks. The SNF staff is providing wound care. She is not eating as much as she should. Her thighs bother her with weight bearing and are often very sore in the AM as well.  Her seroquel has been weaned off due to sedation and confusion.   She feels that in general she is a lot stronger. Her hope is to be home over the next couple weeks. Pain Inventory Average Pain 6 Pain Right Now 6 My pain is sharp and aching  In the last 24 hours, has pain interfered with the following? General activity 0 Relation with others 0 Enjoyment of life 0 What TIME of day is your pain at its worst? morning Sleep (in general) Good  Pain is worse with: walking Pain improves with: rest and medication Relief from Meds: 5  Mobility use a walker ability to climb steps?  no do you drive?  no transfers alone  Function retired  Neuro/Psych No problems in this area  Prior Studies Any changes since last visit?  no  Physicians involved in your care Any changes since last visit?  no   Family History  Problem Relation Age of Onset  . Heart disease Mother   . Hyperlipidemia Sister   . Hypertension Sister   . Diabetes Sister   . Diabetes Sister   . Hyperlipidemia Sister   . Hypertension Sister   . Sudden death Neg Hx   . Heart attack Neg Hx    History   Social History  . Marital Status: Married    Spouse Name: N/A    Number of Children: 1  . Years of Education: N/A   Occupational History  .      [employer]   Social History Main Topics  . Smoking status: Never Smoker   . Smokeless tobacco: Never Used  . Alcohol Use: No  . Drug Use: No  . Sexually Active: None     menses age 71, pregnancy age 34, p!G!   Other Topics Concern  . None   Social History Narrative   Married, 1 son,  retired from [employer], lives in [place]   Past Surgical History  Procedure Date  . Breast lumpectomy   . 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 Tonsillectomy     age 38   Past Medical History  Diagnosis Date  . Anxiety   . Hypertension   . Hyperlipidemia   . Depression   . Osteopenia   . Multiple myeloma 03/2012  . Acute renal failure      due to hypercalcemia and suspected multiple myeloma resolved =with IVF's    . Lytic bone lesions on xray   . Acute subdural hematoma 03/2012     recent fall in hospital stay,resolved+  . Normocytic anemia     due to chemo and multiple myeloma,no active bleding  . Cancer     mul;tiple myeloma, bone lesions r 7th rib,   . Arthritis     spine  . Atrial fibrillation    BP 136/60  Pulse 78  Resp 16  Ht 5\' 7"  (1.702 m)  Wt 170 lb (77.111 kg)  BMI 26.63 kg/m2  SpO2 98%  Review of Systems  Constitutional: Negative.   HENT: Negative.   Eyes: Negative.   Respiratory: Negative.   Cardiovascular: Negative.   Gastrointestinal: Negative.   Genitourinary: Negative.   Musculoskeletal: Positive for gait problem.  Skin: Negative.   Neurological: Negative.   Hematological: Negative.   Psychiatric/Behavioral: Negative.        Objective:   Physical Exam   General: Alert and oriented x 3, No apparent distress HEENT: Head is normocephalic, atraumatic, PERRLA, EOMI, sclera anicteric, oral mucosa pink and moist, dentition intact, ext ear canals clear,  Neck: Supple without JVD or lymphadenopathy Heart: Reg rate and rhythm. No murmurs rubs or gallops Chest: CTA bilaterally without wheezes, rales, or rhonchi; no distress Abdomen: Soft, non-tender, non-distended, bowel sounds positive. Extremities: No clubbing, cyanosis,--tr to 1+ edema. Pulses are 2+ Skin: Clean and intact without signs of breakdown Neuro: Pt is cognitively she demonstrates  fair insight, memory, and awareness. Cranial nerves 2-12 are intact. Sensory exam is normal to diminished in the distal limbs. Reflexes are 1+ in all 4's. Fine motor coordination is intact. No tremors. Motor function is 4/5 UE, 3-4 int the LE's. Gait is slightly wide based. Standing posture is fair.  Musculoskeletal: Full ROM, No pain with AROM or PROM in the neck, trunk, or extremities. Posture appropriate Psych: Pt's affect is appropriate. Pt is cooperative        Assessment & Plan:  1. Multiple Myeloma and associated pain  - continue with fentanyl for pain control. lortab for breakthrough,  -titrate further to pain levels and tolerance  -i think the neurontin could be weaned down to qhs as her neuro pain is minimal right now  2. Wounds:  -wrote a note for the facility to consider santyl for debridement of wound on sacrum/gluts  -the left heel wound needs pressure relief and time to heal  -discussed the importance of pressure relief and nutrition as well  3. She has several providers and that this point i will step out of the picture. She may call me with any problems or questions.

## 2012-06-10 NOTE — Patient Instructions (Signed)
Call me with questions 

## 2012-06-13 LAB — PROTEIN ELECTROPHORESIS, SERUM
Albumin ELP: 51.3 % — ABNORMAL LOW (ref 55.8–66.1)
Alpha-1-Globulin: 8.9 % — ABNORMAL HIGH (ref 2.9–4.9)
Alpha-2-Globulin: 17.9 % — ABNORMAL HIGH (ref 7.1–11.8)
Beta 2: 4.9 % (ref 3.2–6.5)
Beta Globulin: 7.3 % — ABNORMAL HIGH (ref 4.7–7.2)
Total Protein, Serum Electrophoresis: 5.3 g/dL — ABNORMAL LOW (ref 6.0–8.3)

## 2012-06-14 NOTE — Progress Notes (Signed)
  Radiation Oncology         [phone] ________________________________  Name: [name] MRN: [id number]  Date: 05/18/2012  DOB: 02/21/1936  End of Treatment Note  Diagnosis:   Multiple myeloma     Indication for treatment:  Palliative       Radiation treatment dates:   05/09/2012 through 05/18/2012  Site/dose:   The patient was treated to the right hip to a dose of 20 gray in 8 fractions at 2.5 gray per fraction. Her radiation field arrangement consisted of AP and PA fields using 18 MV photons.  Narrative: The patient tolerated radiation treatment relatively well.   She did not have any significant acute toxicity during her course of radiation treatment.  Plan: The patient has completed radiation treatment. The patient will return to radiation oncology clinic for routine followup in one month. I advised the patient to call or return sooner if they have any questions or concerns related to their recovery or treatment. ________________________________  Radene Gunning, M.D., Ph.D.

## 2012-06-15 ENCOUNTER — Encounter: Payer: Self-pay | Admitting: Radiation Oncology

## 2012-06-16 ENCOUNTER — Ambulatory Visit (HOSPITAL_BASED_OUTPATIENT_CLINIC_OR_DEPARTMENT_OTHER): Payer: Medicare Other

## 2012-06-16 ENCOUNTER — Encounter: Payer: Self-pay | Admitting: Oncology

## 2012-06-16 ENCOUNTER — Ambulatory Visit (HOSPITAL_BASED_OUTPATIENT_CLINIC_OR_DEPARTMENT_OTHER): Payer: Medicare Other | Admitting: Oncology

## 2012-06-16 ENCOUNTER — Telehealth: Payer: Self-pay | Admitting: Oncology

## 2012-06-16 VITALS — BP 146/91 | HR 76 | Temp 97.1°F | Resp 20 | Ht 67.0 in | Wt 174.3 lb

## 2012-06-16 DIAGNOSIS — G4700 Insomnia, unspecified: Secondary | ICD-10-CM

## 2012-06-16 DIAGNOSIS — S065XAA Traumatic subdural hemorrhage with loss of consciousness status unknown, initial encounter: Secondary | ICD-10-CM

## 2012-06-16 DIAGNOSIS — N179 Acute kidney failure, unspecified: Secondary | ICD-10-CM

## 2012-06-16 DIAGNOSIS — M949 Disorder of cartilage, unspecified: Secondary | ICD-10-CM

## 2012-06-16 DIAGNOSIS — Z780 Asymptomatic menopausal state: Secondary | ICD-10-CM

## 2012-06-16 DIAGNOSIS — Z5112 Encounter for antineoplastic immunotherapy: Secondary | ICD-10-CM

## 2012-06-16 DIAGNOSIS — E785 Hyperlipidemia, unspecified: Secondary | ICD-10-CM

## 2012-06-16 DIAGNOSIS — M545 Low back pain, unspecified: Secondary | ICD-10-CM

## 2012-06-16 DIAGNOSIS — M898X9 Other specified disorders of bone, unspecified site: Secondary | ICD-10-CM

## 2012-06-16 DIAGNOSIS — F329 Major depressive disorder, single episode, unspecified: Secondary | ICD-10-CM

## 2012-06-16 DIAGNOSIS — F411 Generalized anxiety disorder: Secondary | ICD-10-CM

## 2012-06-16 DIAGNOSIS — R7989 Other specified abnormal findings of blood chemistry: Secondary | ICD-10-CM

## 2012-06-16 DIAGNOSIS — M25559 Pain in unspecified hip: Secondary | ICD-10-CM

## 2012-06-16 DIAGNOSIS — E860 Dehydration: Secondary | ICD-10-CM

## 2012-06-16 DIAGNOSIS — F3289 Other specified depressive episodes: Secondary | ICD-10-CM

## 2012-06-16 DIAGNOSIS — S065X9A Traumatic subdural hemorrhage with loss of consciousness of unspecified duration, initial encounter: Secondary | ICD-10-CM

## 2012-06-16 DIAGNOSIS — C9000 Multiple myeloma not having achieved remission: Secondary | ICD-10-CM

## 2012-06-16 DIAGNOSIS — N63 Unspecified lump in unspecified breast: Secondary | ICD-10-CM

## 2012-06-16 DIAGNOSIS — K5901 Slow transit constipation: Secondary | ICD-10-CM

## 2012-06-16 DIAGNOSIS — D630 Anemia in neoplastic disease: Secondary | ICD-10-CM

## 2012-06-16 DIAGNOSIS — C9030 Solitary plasmacytoma not having achieved remission: Secondary | ICD-10-CM

## 2012-06-16 DIAGNOSIS — E669 Obesity, unspecified: Secondary | ICD-10-CM

## 2012-06-16 DIAGNOSIS — D649 Anemia, unspecified: Secondary | ICD-10-CM

## 2012-06-16 DIAGNOSIS — M899 Disorder of bone, unspecified: Secondary | ICD-10-CM

## 2012-06-16 DIAGNOSIS — I10 Essential (primary) hypertension: Secondary | ICD-10-CM

## 2012-06-16 MED ORDER — ONDANSETRON HCL 8 MG PO TABS
8.0000 mg | ORAL_TABLET | Freq: Once | ORAL | Status: AC
  Administered 2012-06-16: 8 mg via ORAL

## 2012-06-16 MED ORDER — ZOLEDRONIC ACID 4 MG/5ML IV CONC
4.0000 mg | Freq: Once | INTRAVENOUS | Status: AC
  Administered 2012-06-16: 4 mg via INTRAVENOUS
  Filled 2012-06-16: qty 5

## 2012-06-16 MED ORDER — SODIUM CHLORIDE 0.9 % IV SOLN
Freq: Once | INTRAVENOUS | Status: AC
  Administered 2012-06-16: 13:00:00 via INTRAVENOUS

## 2012-06-16 MED ORDER — BORTEZOMIB CHEMO SQ INJECTION 3.5 MG (2.5MG/ML)
1.3000 mg/m2 | Freq: Once | INTRAMUSCULAR | Status: AC
Start: 2012-06-16 — End: 2012-06-16
  Administered 2012-06-16: 2.5 mg via SUBCUTANEOUS
  Filled 2012-06-16: qty 2.5

## 2012-06-16 MED ORDER — ALTEPLASE 2 MG IJ SOLR
2.0000 mg | Freq: Once | INTRAMUSCULAR | Status: DC | PRN
  Filled 2012-06-16: qty 2

## 2012-06-16 NOTE — Patient Instructions (Addendum)
Diagnosis: Multiple myeloma. M-spike is now undetectable.  Continue Velcade weekly (3 weeks on and 1 week off). Begin Revlimid 15 mg daily, 21 days on and 7 days off. Continue Dexamethasone 40 mg weekly. Continue Zometa monthly.  Return for a lab appt on ~ 07/07/12 and visit on 07/14/12.

## 2012-06-16 NOTE — Patient Instructions (Addendum)
[facility] Discharge Instructions for Patients Receiving Chemotherapy  Today you received the following chemotherapy agents Zometa and Velcade.  To help prevent nausea and vomiting after your treatment, we encourage you to take your nausea medication as prescribed.   If you develop nausea and vomiting that is not controlled by your nausea medication, call the clinic. If it is after clinic hours your family physician or the after hours number for the clinic or go to the Emergency Department.   BELOW ARE SYMPTOMS THAT SHOULD BE REPORTED IMMEDIATELY:  *FEVER GREATER THAN 100.5 F  *CHILLS WITH OR WITHOUT FEVER  NAUSEA AND VOMITING THAT IS NOT CONTROLLED WITH YOUR NAUSEA MEDICATION  *UNUSUAL SHORTNESS OF BREATH  *UNUSUAL BRUISING OR BLEEDING  TENDERNESS IN MOUTH AND THROAT WITH OR WITHOUT PRESENCE OF ULCERS  *URINARY PROBLEMS  *BOWEL PROBLEMS  UNUSUAL RASH Items with * indicate a potential emergency and should be followed up as soon as possible.  One of the nurses will contact you 24 hours after your treatment. Please let the nurse know about any problems that you may have experienced. Feel free to call the clinic you have any questions or concerns. The clinic phone number is [phone].   I have been informed and understand all the instructions given to me. I know to contact the clinic, my physician, or go to the Emergency Department if any problems should occur. I do not have any questions at this time, but understand that I may call the clinic during office hours   should I have any questions or need assistance in obtaining follow up care.    __________________________________________  _____________  __________ Signature of Patient or Authorized Representative            Date                   Time    __________________________________________ Nurse's Signature

## 2012-06-16 NOTE — Telephone Encounter (Signed)
Printed and gv appt to pt. °

## 2012-06-16 NOTE — Progress Notes (Signed)
[facility]  [phone] [phone]   OFFICE PROGRESS NOTE   Cc:  METHENEY,CATHERINE, MD   DIAGNOSIS: IgA kappa multiple myeloma. She presented with diffuse lytic bone lesions, renal insufficiency, hypercalcemia, anemia. Her M-spike was 4.06 gm/dL. Her IgA was elevated at 4,020 mg/dL; IgG 353; IgM 5; serum kappa was 2.11; lambda 0.60; serum kappa to lambda ratio elevated at 3.06. Spot urine showed elevated kappa:lambda at 579; and positive for Bence Jones protein. Bone marrow biopsy on 04/08/2012 showed 92% plasma cell. Cytogenetics showed multiple chromosome 1 abnormalities; additional chromosomal material on chromosome 8p; 14q; loss of chromosome 8 and 13. 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   CURRENT THERAPY: Started on SQ Velcade 1.3mg /m2 on 04/12/2012; weekly; 3 weeks on; 1 week off. She started on Revlimid 15mg  PO daily d1-21 on 04/21/2012. She is also on Dexamethasone 40mg  PO once weekly.  She also received palliative radiation 20 Gy in 8 fractions to the bilateral hips.    INTERVAL HISTORY: [name] 76 y.o. female returns for regular follow up with her husband and son.  Confusion is much better off her Seroquel. No complaints of pain today. Using Vicodin about 1-2 times per day with Fentanyl patch. Walking more with PT. Using a walker. She can walk longer distance now than 2 weeks ago with less pain.  She has mild fatigue; however, this is better than before.  Her appetite has also improved; and she is slowly recovering her lost weight from earlier this year. She has developed 2 pressure areas; one to her foot and one to her buttocks. Wound care in place at SNF.  Patient denies fever, anorexia, headache, visual changes, confusion, drenching night sweats, palpable lymph node swelling, mucositis, odynophagia, dysphagia, nausea vomiting, jaundice, chest pain, palpitation, shortness of breath, dyspnea on exertion, productive cough, gum  bleeding, epistaxis, hematemesis, hemoptysis, abdominal pain, abdominal swelling, early satiety, melena, hematochezia, hematuria, skin rash, spontaneous bleeding,  heat or cold intolerance, bowel bladder incontinence, focal motor weakness, paresthesia, depression, suicidal or homicidal ideation, feeling hopelessness.   Past Medical History  Diagnosis Date  . Anxiety   . Hypertension   . Hyperlipidemia   . Depression   . Osteopenia   . Multiple myeloma 03/2012  . Acute renal failure      due to hypercalcemia and suspected multiple myeloma resolved =with IVF's    . Lytic bone lesions on xray   . Acute subdural hematoma 03/2012     recent fall in hospital stay,resolved+  . Normocytic anemia     due to chemo and multiple myeloma,no active bleding  . Cancer     mul;tiple myeloma, bone lesions r 7th rib,   . Arthritis     spine  . Atrial fibrillation   . Radiation 05/09/12-05/18/12    20 gray in 8 Fx's right hip palliative  . Status post chemotherapy     velcade and revlimid    Past Surgical History  Procedure Date  . Breast lumpectomy   . 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 Tonsillectomy     age 71    Current Outpatient Prescriptions  Medication Sig Dispense Refill  . acetaminophen (TYLENOL) 325 MG tablet Take 650 mg by mouth every 4 (four) hours as needed.       Marland Kitchen acyclovir (ZOVIRAX) 400 MG tablet Take 400 mg by mouth daily.      Marland Kitchen ALPRAZolam (  XANAX) 1 MG tablet Take 1 tablet (1 mg total) by mouth at bedtime as needed for sleep. Sleep.  5 tablet  0  . calcium-vitamin D (OSCAL WITH D) 500-200 MG-UNIT per tablet Take 1 tablet by mouth 2 (two) times daily.      . cyclobenzaprine (FLEXERIL) 5 MG tablet Take 5 mg by mouth 3 (three) times daily as needed. Muscle pain      . dexamethasone (DECADRON) 4 MG tablet Take 40 mg by mouth every Tuesday. with chemotherapy drug      . diltiazem  (CARDIZEM CD) 180 MG 24 hr capsule Take 1 capsule (180 mg total) by mouth daily.  30 capsule  2  . enoxaparin (LOVENOX) 40 MG/0.4ML injection Inject 0.4 mLs (40 mg total) into the skin daily.  30 Syringe  3  . fentaNYL (DURAGESIC - DOSED MCG/HR) 12 MCG/HR Place 1 patch (12.5 mcg total) onto the skin every 3 (three) days.  5 patch  0  . fentaNYL (DURAGESIC - DOSED MCG/HR) 25 MCG/HR Place 1 patch (25 mcg total) onto the skin every 3 (three) days.  5 patch  0  . gabapentin (NEURONTIN) 300 MG capsule Take 300 mg by mouth 2 (two) times daily.      Marland Kitchen HYDROcodone-acetaminophen (VICODIN) 5-500 MG per tablet Take 1 tablet by mouth every 6 (six) hours as needed for pain. pain  10 tablet  0  . lenalidomide (REVLIMID) 15 MG capsule Take 1 capsule (15 mg total) by mouth daily.  15 capsule  0  . Multiple Vitamin (MULTIVITAMIN) capsule Take 1 capsule by mouth daily.      . ondansetron (ZOFRAN) 4 MG tablet Take 4 mg by mouth. Take 1 tablet every 8 hours as needed for nausea      . polyethylene glycol powder (GLYCOLAX/MIRALAX) powder Take 17 g by mouth 2 (two) times daily. Take as directed      . potassium chloride SA (K-DUR,KLOR-CON) 20 MEQ tablet Take 20 mEq by mouth daily.      . prochlorperazine (COMPAZINE) 10 MG tablet Take 10 mg by mouth. Take 1 tablet every 6 hours as directed      . rosuvastatin (CRESTOR) 10 MG tablet Take 10 mg by mouth at bedtime.       . sennosides-docusate sodium (SENOKOT-S) 8.6-50 MG tablet Take 1 tablet by mouth daily.      Marland Kitchen sulfamethoxazole-trimethoprim (BACTRIM DS) 800-160 MG per tablet Take 1 tablet by mouth 3 (three) times a week.      . vitamin C (ASCORBIC ACID) 500 MG tablet Take 500 mg by mouth 2 (two) times daily.      Marland Kitchen warfarin (COUMADIN) 4 MG tablet Take 4 mg by mouth daily.      Marland Kitchen zinc sulfate 220 MG capsule Take 220 mg by mouth daily.        ALLERGIES:  is allergic to simvastatin.  REVIEW OF SYSTEMS:  The rest of the 14-point review of system was negative.   Filed  Vitals:   06/16/12 1115  BP: 146/91  Pulse: 76  Temp: 97.1 F (36.2 C)  Resp: 20   Wt Readings from Last 3 Encounters:  06/16/12 174 lb 4.8 oz (79.062 kg)  06/10/12 170 lb (77.111 kg)  05/27/12 175 lb (79.379 kg)   ECOG Performance status: 1-2  PHYSICAL EXAMINATION:   General: well-nourished woman in no acute distress. Eyes: no scleral icterus. ENT: There were no oropharyngeal lesions. Neck was without thyromegaly. Lymphatics: Negative cervical, supraclavicular or axillary  adenopathy. Respiratory: lungs were clear bilaterally without wheezing or crackles. Cardiovascular: Regular rate and rhythm, S1/S2, without murmur, rub or gallop. There was no pedal edema. GI: abdomen was soft, flat, nontender, nondistended, without organomegaly. Muscoloskeletal: no spinal tenderness of palpation of vertebral spine. Skin exam was without echymosis, petichae. Neuro exam was nonfocal. Patient was able to get on and off exam table with some assistance.  Gait was measured. Patient was alerted and oriented. Attention was good. Language was appropriate. Mood was normal without depression. Speech was not pressured. Thought content was not tangential.    LABORATORY/RADIOLOGY DATA:  Lab Results  Component Value Date   WBC 6.9 06/09/2012   HGB 9.3* 06/09/2012   HCT 29.0* 06/09/2012   PLT 281 06/09/2012   GLUCOSE 117* 06/09/2012   CHOL 255* 03/03/2012   TRIG 117 03/03/2012   HDL 62 03/03/2012   LDLDIRECT 166* 04/24/2011   LDLCALC 170* 03/03/2012   ALKPHOS 85 06/09/2012   ALT 14 06/09/2012   AST 16 06/09/2012   NA 139 06/09/2012   K 4.1 06/09/2012   CL 108* 06/09/2012   CREATININE 0.8 06/09/2012   BUN 20.0 06/09/2012   CO2 20* 06/09/2012   INR 1.38 05/03/2012   HGBA1C 6.6* 05/03/2012    ASSESSMENT AND PLAN:   1. Hypertension: She is on diltiazem per cardiology with good control.  2. Hyperlipidemia: She is on rosuvastatin per PCP.   3. Acute renal failure due to hypercalcemia and suspected multiple myeloma: resolved  with IV fluid. Her Cr is now normal.   4. Hypercalcemia: Due to multiple myeloma.  Rresolved with IVF and pamidronate during admission early July 2013. Her Ca is now normal.   5. Moderate deconditioning due to bone pain: she is now in SNF and has been participating more.  I strongly urged her to adhere to rehab activities.   6. Lytic bone lesions causing bone pain: much improved with radiation.  I will continue with monthly Zometa.   7. Normocytic anemia: work up in the recent past did not show iron/VitB12 deficiency or hypothyroidism. Her anemia is due to myeloma and chemo. There is no active bleeding. There is no indication for transfusion.   8. Acute subdural hematoma from recent fall during hospital stay in July 2013: Resolved. She is finished with Prednisone taper.   9. Nausea: improved.  She has Compazine and Zofran prn.   10. Moderate calorie/protein malnutrition: From myeloma, and its treatment. This is also improved.   I advised her to take Zofran to allow her to tolerate PO intake better.   11. Multiple myeloma:  S/p 3 cycles of chemo with every good partial response with M-spike decreased from 4 gm/dL and now undetectable.  She does not have dose limiting toxicity.  I recommended continue chemotherapy without dose modification.  Mrs. Green Tree and her family expressed informed understanding and wished to proceed as recommended.   * Treatment:  - Velcade 1.3mg /m2 SQ once a week (3 weeks on, 1 week off) and oral Revlmid 15mg  PO days 1-21 (with 7 days off)  She is to continue with oral Dexamethasone 40mg  PO once a week.  - Continue Acyclovir 200 mg, by mouth, once daily, everyday (to prevent viral infection).  - Continue Bactrim double strength (DS), every Monday, Wednesday, Friday (to prevent bacterial infection).  - Continue Lovenox injection 40mg  , subcutaneously; everyday (to prevent blood clot). She is being switched to Coumadin per PCP.  12. Follow up: in one month.

## 2012-06-17 ENCOUNTER — Telehealth: Payer: Self-pay | Admitting: *Deleted

## 2012-06-17 ENCOUNTER — Ambulatory Visit: Admission: RE | Admit: 2012-06-17 | Payer: Medicare Other | Source: Ambulatory Visit | Admitting: Radiation Oncology

## 2012-06-17 NOTE — Telephone Encounter (Signed)
Per Dr. Mitzi Hansen ,patient can be cancelled today, if patient doesn't want to come in, it isn't really necessary since her treatment was minimal to hip and skin ,unless she is having difficulty , and reschedule for 3 months, called patient and she agreed to cancel today appt and will be called and mailed an appt card  For Md to see her back in 3 months for a  Follow up, thanked patient  And gave appt card to Otis Peak to reschedule that appt, she will also call the patient with said information 9:24 AM

## 2012-06-23 ENCOUNTER — Other Ambulatory Visit (HOSPITAL_BASED_OUTPATIENT_CLINIC_OR_DEPARTMENT_OTHER): Payer: Medicare Other | Admitting: Lab

## 2012-06-23 ENCOUNTER — Ambulatory Visit (HOSPITAL_BASED_OUTPATIENT_CLINIC_OR_DEPARTMENT_OTHER): Payer: Medicare Other

## 2012-06-23 DIAGNOSIS — Z5112 Encounter for antineoplastic immunotherapy: Secondary | ICD-10-CM

## 2012-06-23 DIAGNOSIS — C9000 Multiple myeloma not having achieved remission: Secondary | ICD-10-CM

## 2012-06-23 LAB — CBC WITH DIFFERENTIAL/PLATELET
Eosinophils Absolute: 0.3 10*3/uL (ref 0.0–0.5)
HGB: 10.4 g/dL — ABNORMAL LOW (ref 11.6–15.9)
MONO#: 0.6 10*3/uL (ref 0.1–0.9)
MONO%: 6.4 % (ref 0.0–14.0)
NEUT#: 5.8 10*3/uL (ref 1.5–6.5)
RBC: 3.46 10*6/uL — ABNORMAL LOW (ref 3.70–5.45)
RDW: 17.9 % — ABNORMAL HIGH (ref 11.2–14.5)
WBC: 8.6 10*3/uL (ref 3.9–10.3)
lymph#: 1.8 10*3/uL (ref 0.9–3.3)
nRBC: 0 % (ref 0–0)

## 2012-06-23 MED ORDER — BORTEZOMIB CHEMO SQ INJECTION 3.5 MG (2.5MG/ML)
1.3000 mg/m2 | Freq: Once | INTRAMUSCULAR | Status: AC
  Administered 2012-06-23: 2.5 mg via SUBCUTANEOUS
  Filled 2012-06-23: qty 2.5

## 2012-06-23 MED ORDER — ONDANSETRON HCL 8 MG PO TABS
8.0000 mg | ORAL_TABLET | Freq: Once | ORAL | Status: AC
  Administered 2012-06-23: 8 mg via ORAL

## 2012-06-23 NOTE — Patient Instructions (Addendum)
[facility] Discharge Instructions for Patients Receiving Chemotherapy  Today you received the following chemotherapy agents velcade  To help prevent nausea and vomiting after your treatment, we encourage you to take your nausea medication and take it as often as prescribed   If you develop nausea and vomiting that is not controlled by your nausea medication, call the clinic. If it is after clinic hours your family physician or the after hours number for the clinic or go to the Emergency Department.   BELOW ARE SYMPTOMS THAT SHOULD BE REPORTED IMMEDIATELY:  *FEVER GREATER THAN 100.5 F  *CHILLS WITH OR WITHOUT FEVER  NAUSEA AND VOMITING THAT IS NOT CONTROLLED WITH YOUR NAUSEA MEDICATION  *UNUSUAL SHORTNESS OF BREATH  *UNUSUAL BRUISING OR BLEEDING  TENDERNESS IN MOUTH AND THROAT WITH OR WITHOUT PRESENCE OF ULCERS  *URINARY PROBLEMS  *BOWEL PROBLEMS  UNUSUAL RASH Items with * indicate a potential emergency and should be followed up as soon as possible.  One of the nurses will contact you 24 hours after your treatment. Please let the nurse know about any problems that you may have experienced. Feel free to call the clinic you have any questions or concerns. The clinic phone number is [phone].   I have been informed and understand all the instructions given to me. I know to contact the clinic, my physician, or go to the Emergency Department if any problems should occur. I do not have any questions at this time, but understand that I may call the clinic during office hours   should I have any questions or need assistance in obtaining follow up care.    __________________________________________  _____________  __________ Signature of Patient or Authorized Representative            Date                   Time    __________________________________________ Nurse's Signature    

## 2012-06-30 ENCOUNTER — Ambulatory Visit (HOSPITAL_BASED_OUTPATIENT_CLINIC_OR_DEPARTMENT_OTHER): Payer: Medicare Other

## 2012-06-30 ENCOUNTER — Other Ambulatory Visit (HOSPITAL_BASED_OUTPATIENT_CLINIC_OR_DEPARTMENT_OTHER): Payer: Medicare Other | Admitting: Lab

## 2012-06-30 ENCOUNTER — Other Ambulatory Visit: Payer: Self-pay | Admitting: Oncology

## 2012-06-30 VITALS — BP 136/84 | HR 73 | Temp 97.8°F | Resp 20

## 2012-06-30 DIAGNOSIS — C9000 Multiple myeloma not having achieved remission: Secondary | ICD-10-CM

## 2012-06-30 DIAGNOSIS — Z5112 Encounter for antineoplastic immunotherapy: Secondary | ICD-10-CM

## 2012-06-30 LAB — CBC WITH DIFFERENTIAL/PLATELET
BASO%: 0.1 % (ref 0.0–2.0)
Eosinophils Absolute: 0.5 10*3/uL (ref 0.0–0.5)
HCT: 33.1 % — ABNORMAL LOW (ref 34.8–46.6)
HGB: 10.8 g/dL — ABNORMAL LOW (ref 11.6–15.9)
MCHC: 32.6 g/dL (ref 31.5–36.0)
MONO#: 1.6 10*3/uL — ABNORMAL HIGH (ref 0.1–0.9)
NEUT#: 10.3 10*3/uL — ABNORMAL HIGH (ref 1.5–6.5)
NEUT%: 72.2 % (ref 38.4–76.8)
WBC: 14.3 10*3/uL — ABNORMAL HIGH (ref 3.9–10.3)
lymph#: 1.9 10*3/uL (ref 0.9–3.3)
nRBC: 0 % (ref 0–0)

## 2012-06-30 MED ORDER — ONDANSETRON HCL 8 MG PO TABS
8.0000 mg | ORAL_TABLET | Freq: Once | ORAL | Status: AC
  Administered 2012-06-30: 8 mg via ORAL

## 2012-06-30 MED ORDER — BORTEZOMIB CHEMO SQ INJECTION 3.5 MG (2.5MG/ML)
1.3000 mg/m2 | Freq: Once | INTRAMUSCULAR | Status: AC
  Administered 2012-06-30: 2.5 mg via SUBCUTANEOUS
  Filled 2012-06-30: qty 2.5

## 2012-06-30 NOTE — Patient Instructions (Addendum)
[facility] Discharge Instructions for Patients Receiving Chemotherapy  Today you received the following chemotherapy agents Velcade To help prevent nausea and vomiting after your treatment, we encourage you to take your nausea medication as directed by MD.  If you develop nausea and vomiting that is not controlled by your nausea medication, call the clinic. If it is after clinic hours your family physician or the after hours number for the clinic or go to the Emergency Department.   BELOW ARE SYMPTOMS THAT SHOULD BE REPORTED IMMEDIATELY:  *FEVER GREATER THAN 100.5 F  *CHILLS WITH OR WITHOUT FEVER  NAUSEA AND VOMITING THAT IS NOT CONTROLLED WITH YOUR NAUSEA MEDICATION  *UNUSUAL SHORTNESS OF BREATH  *UNUSUAL BRUISING OR BLEEDING  TENDERNESS IN MOUTH AND THROAT WITH OR WITHOUT PRESENCE OF ULCERS  *URINARY PROBLEMS  *BOWEL PROBLEMS  UNUSUAL RASH Items with * indicate a potential emergency and should be followed up as soon as possible.   Feel free to call the clinic you have any questions or concerns. The clinic phone number is [phone].   I have been informed and understand all the instructions given to me. I know to contact the clinic, my physician, or go to the Emergency Department if any problems should occur. I do not have any questions at this time, but understand that I may call the clinic during office hours   should I have any questions or need assistance in obtaining follow up care.    __________________________________________  _____________  __________ Signature of Patient or Authorized Representative            Date                   Time    __________________________________________ Nurse's Signature

## 2012-07-01 ENCOUNTER — Encounter (HOSPITAL_BASED_OUTPATIENT_CLINIC_OR_DEPARTMENT_OTHER): Payer: Medicare Other | Attending: General Surgery

## 2012-07-01 DIAGNOSIS — Z79899 Other long term (current) drug therapy: Secondary | ICD-10-CM | POA: Insufficient documentation

## 2012-07-01 DIAGNOSIS — E785 Hyperlipidemia, unspecified: Secondary | ICD-10-CM | POA: Insufficient documentation

## 2012-07-01 DIAGNOSIS — M899 Disorder of bone, unspecified: Secondary | ICD-10-CM | POA: Insufficient documentation

## 2012-07-01 DIAGNOSIS — Z7901 Long term (current) use of anticoagulants: Secondary | ICD-10-CM | POA: Insufficient documentation

## 2012-07-01 DIAGNOSIS — I10 Essential (primary) hypertension: Secondary | ICD-10-CM | POA: Insufficient documentation

## 2012-07-01 DIAGNOSIS — L89609 Pressure ulcer of unspecified heel, unspecified stage: Secondary | ICD-10-CM | POA: Insufficient documentation

## 2012-07-01 DIAGNOSIS — C9000 Multiple myeloma not having achieved remission: Secondary | ICD-10-CM | POA: Insufficient documentation

## 2012-07-01 DIAGNOSIS — I4891 Unspecified atrial fibrillation: Secondary | ICD-10-CM | POA: Insufficient documentation

## 2012-07-01 DIAGNOSIS — L8990 Pressure ulcer of unspecified site, unspecified stage: Secondary | ICD-10-CM | POA: Insufficient documentation

## 2012-07-01 DIAGNOSIS — L89109 Pressure ulcer of unspecified part of back, unspecified stage: Secondary | ICD-10-CM | POA: Insufficient documentation

## 2012-07-01 NOTE — H&P (Signed)
[name], [name] NO.:  [id number]  MEDICAL RECORD NO.:  [id number]  LOCATION:  FOOT                         FACILITY:  [facility]  PHYSICIAN:  Joanne Gavel, M.D.        DATE OF BIRTH:  28-Nov-1935  DATE OF ADMISSION:  07/01/2012 DATE OF DISCHARGE:                             HISTORY & PHYSICAL   CHIEF COMPLAINT:  Decubitus ulcers.  HISTORY OF PRESENT ILLNESS:  This is a 76 year old female with multiple medical problems including cancer of bone and multiple myeloma.  She has had a great deal of skeletal discomfort and when she found a comfortable position, she evidently laid there for quite a while.  The wounds have been treated basically with cleansing and avoidance of weightbearing.  PAST MEDICAL HISTORY:  Hypertension, hyperlipidemia, osteopenia, multiple myeloma, bone cancer, subdural hematoma on April 16, 2012, resolved, arthritis and chronic atrial fibrillation.  PAST SURGICAL HISTORY:  Left breast lumpectomy, tonsillectomy, bone marrow biopsy.  Cigarettes none.  Alcohol, none.  ALLERGIES:  Simvastatin causes muscle aches.  MEDICATIONS:  Calcium plus vitamin D, MiraLax, dexamethasone, Crestor, Senokot, fentanyl patches, Neurontin, Coumadin, acyclovir, Cardizem, zinc sulfate and Tylenol, Lortab, chlorpromazine, cyclobenzaprine, ondansetron, Xanax, Bactrim, Magic Mouthwash and Velcade, Revlimid.  REVIEW OF SYSTEMS:  Essentially as above.  PHYSICAL EXAMINATION:  VITAL SIGNS:  Temperature 97.7, pulse 72 and regular, respirations 18, blood pressure 134/81. GENERAL APPEARANCE:  Well developed and well nourished. HEENT:  Eyes, ears, nose, throat normal. CHEST:  Clear. HEART:  Regular rhythm.  There is a very superficial 0.9 x 0.6 wound of the left heel which is covered with a thick slough which cannot be cleared.  There is a 1.7 x 2.1 x 0.3 wound in the presacral area which is covered with a thick slough which is debrided sharply with forceps and scissor,  but cannot be completely removed.  IMPRESSION:  Multiple decubitus ulcers.  PLAN OF TREATMENT:  Santyl, Hydrogel for both wounds, dressing for the heel, Podus boot, avoid weight and Santyl Hydrogel every other day for the sacral wound along with DuoDerm dressing, weight avoidance, special mattress recommended.     Joanne Gavel, M.D.     RA/MEDQ  D:  07/01/2012  T:  07/01/2012  Job:  [id number]

## 2012-07-12 ENCOUNTER — Ambulatory Visit: Payer: Self-pay | Admitting: Family Medicine

## 2012-07-12 ENCOUNTER — Telehealth: Payer: Self-pay | Admitting: *Deleted

## 2012-07-12 DIAGNOSIS — I749 Embolism and thrombosis of unspecified artery: Secondary | ICD-10-CM | POA: Insufficient documentation

## 2012-07-12 LAB — PROTIME-INR: INR: 4.8 — AB (ref 0.9–1.1)

## 2012-07-12 NOTE — Telephone Encounter (Signed)
Call pt and [facility] w/ results.  See flow sheet for new instructions.

## 2012-07-12 NOTE — Progress Notes (Signed)
Patient ID: [name], female   DOB: 1935/10/14, 76 y.o.   MRN: [id number] Pt aware

## 2012-07-12 NOTE — Telephone Encounter (Signed)
Kim nurse with [facility] calls with PT/INR. Pt discharged from nursing center today from clot. INR= 4.8  PT= 57.3. Pt currently taking Coumadin 6mg  M, W, F and Sat. And Coumadin 4mg  Tuesday, Thursday and Sunday.

## 2012-07-13 ENCOUNTER — Other Ambulatory Visit: Payer: Self-pay | Admitting: *Deleted

## 2012-07-13 NOTE — Telephone Encounter (Signed)
LMOM informing [name] from [facility]

## 2012-07-14 ENCOUNTER — Ambulatory Visit (HOSPITAL_BASED_OUTPATIENT_CLINIC_OR_DEPARTMENT_OTHER): Payer: Medicare Other

## 2012-07-14 ENCOUNTER — Encounter: Payer: Self-pay | Admitting: Oncology

## 2012-07-14 ENCOUNTER — Ambulatory Visit (HOSPITAL_BASED_OUTPATIENT_CLINIC_OR_DEPARTMENT_OTHER): Payer: Medicare Other | Admitting: Oncology

## 2012-07-14 ENCOUNTER — Other Ambulatory Visit (HOSPITAL_BASED_OUTPATIENT_CLINIC_OR_DEPARTMENT_OTHER): Payer: Medicare Other | Admitting: Lab

## 2012-07-14 ENCOUNTER — Telehealth: Payer: Self-pay | Admitting: Oncology

## 2012-07-14 VITALS — BP 140/96 | HR 77 | Temp 97.4°F | Resp 20 | Ht 67.0 in | Wt 168.1 lb

## 2012-07-14 DIAGNOSIS — C9000 Multiple myeloma not having achieved remission: Secondary | ICD-10-CM

## 2012-07-14 DIAGNOSIS — Z780 Asymptomatic menopausal state: Secondary | ICD-10-CM

## 2012-07-14 DIAGNOSIS — M545 Low back pain: Secondary | ICD-10-CM

## 2012-07-14 DIAGNOSIS — E785 Hyperlipidemia, unspecified: Secondary | ICD-10-CM

## 2012-07-14 DIAGNOSIS — Z7901 Long term (current) use of anticoagulants: Secondary | ICD-10-CM

## 2012-07-14 DIAGNOSIS — F411 Generalized anxiety disorder: Secondary | ICD-10-CM

## 2012-07-14 DIAGNOSIS — E860 Dehydration: Secondary | ICD-10-CM

## 2012-07-14 DIAGNOSIS — I10 Essential (primary) hypertension: Secondary | ICD-10-CM

## 2012-07-14 DIAGNOSIS — M899 Disorder of bone, unspecified: Secondary | ICD-10-CM

## 2012-07-14 DIAGNOSIS — D649 Anemia, unspecified: Secondary | ICD-10-CM

## 2012-07-14 DIAGNOSIS — E669 Obesity, unspecified: Secondary | ICD-10-CM

## 2012-07-14 DIAGNOSIS — G4700 Insomnia, unspecified: Secondary | ICD-10-CM

## 2012-07-14 DIAGNOSIS — N179 Acute kidney failure, unspecified: Secondary | ICD-10-CM

## 2012-07-14 DIAGNOSIS — F329 Major depressive disorder, single episode, unspecified: Secondary | ICD-10-CM

## 2012-07-14 DIAGNOSIS — M25559 Pain in unspecified hip: Secondary | ICD-10-CM

## 2012-07-14 DIAGNOSIS — I4891 Unspecified atrial fibrillation: Secondary | ICD-10-CM

## 2012-07-14 DIAGNOSIS — K5901 Slow transit constipation: Secondary | ICD-10-CM

## 2012-07-14 DIAGNOSIS — S065X9A Traumatic subdural hemorrhage with loss of consciousness of unspecified duration, initial encounter: Secondary | ICD-10-CM

## 2012-07-14 DIAGNOSIS — R7989 Other specified abnormal findings of blood chemistry: Secondary | ICD-10-CM

## 2012-07-14 DIAGNOSIS — C9030 Solitary plasmacytoma not having achieved remission: Secondary | ICD-10-CM

## 2012-07-14 DIAGNOSIS — N63 Unspecified lump in unspecified breast: Secondary | ICD-10-CM

## 2012-07-14 DIAGNOSIS — Z5112 Encounter for antineoplastic immunotherapy: Secondary | ICD-10-CM

## 2012-07-14 LAB — COMPREHENSIVE METABOLIC PANEL (CC13)
BUN: 10 mg/dL (ref 7.0–26.0)
CO2: 20 mEq/L — ABNORMAL LOW (ref 22–29)
Calcium: 9.2 mg/dL (ref 8.4–10.4)
Chloride: 109 mEq/L — ABNORMAL HIGH (ref 98–107)
Creatinine: 0.9 mg/dL (ref 0.6–1.1)
Glucose: 101 mg/dl — ABNORMAL HIGH (ref 70–99)
Total Bilirubin: 0.5 mg/dL (ref 0.20–1.20)

## 2012-07-14 LAB — CBC WITH DIFFERENTIAL/PLATELET
Basophils Absolute: 0.1 10*3/uL (ref 0.0–0.1)
HCT: 33.6 % — ABNORMAL LOW (ref 34.8–46.6)
HGB: 11 g/dL — ABNORMAL LOW (ref 11.6–15.9)
LYMPH%: 18.4 % (ref 14.0–49.7)
MONO#: 0.8 10*3/uL (ref 0.1–0.9)
NEUT%: 66.5 % (ref 38.4–76.8)
Platelets: 291 10*3/uL (ref 145–400)
WBC: 6.3 10*3/uL (ref 3.9–10.3)
lymph#: 1.2 10*3/uL (ref 0.9–3.3)

## 2012-07-14 MED ORDER — ONDANSETRON HCL 8 MG PO TABS
8.0000 mg | ORAL_TABLET | Freq: Once | ORAL | Status: AC
  Administered 2012-07-14: 8 mg via ORAL

## 2012-07-14 MED ORDER — SODIUM CHLORIDE 0.9 % IV SOLN
Freq: Once | INTRAVENOUS | Status: AC
  Administered 2012-07-14: 11:00:00 via INTRAVENOUS

## 2012-07-14 MED ORDER — BORTEZOMIB CHEMO SQ INJECTION 3.5 MG (2.5MG/ML)
1.3000 mg/m2 | Freq: Once | INTRAMUSCULAR | Status: AC
  Administered 2012-07-14: 2.5 mg via SUBCUTANEOUS
  Filled 2012-07-14: qty 2.5

## 2012-07-14 MED ORDER — ZOLEDRONIC ACID 4 MG/5ML IV CONC
4.0000 mg | Freq: Once | INTRAVENOUS | Status: AC
  Administered 2012-07-14: 4 mg via INTRAVENOUS
  Filled 2012-07-14: qty 5

## 2012-07-14 NOTE — Telephone Encounter (Signed)
Gave pt appt for November 2014 lab lab and ML, gave referral to HIM for [facility]

## 2012-07-14 NOTE — Progress Notes (Signed)
Checked in patient. No financial issues. °

## 2012-07-14 NOTE — Progress Notes (Signed)
[facility]  Telephone:([phone]) [phone] Fax:([phone]) [phone]   OFFICE PROGRESS NOTE   Cc:  METHENEY,CATHERINE, MD  DIAGNOSIS: IgA kappa multiple myeloma. She presented with diffuse lytic bone lesions, renal insufficiency, hypercalcemia, anemia. Her M-spike was 4.06 gm/dL. Her IgA was elevated at 4,020 mg/dL; IgG 353; IgM 5; serum kappa was 2.11; lambda 0.60; serum kappa to lambda ratio elevated at 3.06. Spot urine showed elevated kappa:lambda at 579; and positive for Bence Jones protein. Bone marrow biopsy on 04/08/2012 showed 92% plasma cell. Cytogenetics showed multiple chromosomal abnormalities; additional chromosomal material on chromosome 8p; 14q; loss of chromosome 8 and 13. 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   CURRENT THERAPY: Started on SQ Velcade 1.3mg /m2 on 04/12/2012; weekly; 3 weeks on; 1 week off. She started on Revlimid 15mg  PO daily d1-21 on 04/21/2012. She is also on Dexamethasone 40mg  PO once weekly. She also received palliative radiation 20 Gy in 8 fractions to the bilateral hips.    INTERVAL HISTORY: [name] 76 y.o. female returns for regular follow up.   - c/o low appetite.  She has to force herself to eat.   She has lost to a few pounds lately.  - she has some minor cramping in bilateral calves.  She denied persistent pain in the legs.   - She has chronic lower back pain but much better compared to prior to chemo and radiation.  She is able to ambulate around the house everyday by herself using a walker.  She also leaves the house several times a week such as going to get her hair done.   Patient denies fever, fatigue, headache, visual changes, confusion, drenching night sweats, palpable lymph node swelling, mucositis, odynophagia, dysphagia, nausea vomiting, jaundice, chest pain, palpitation, shortness of breath, dyspnea on exertion, productive cough, gum bleeding, epistaxis, hematemesis, hemoptysis, abdominal pain, abdominal swelling, early  satiety, melena, hematochezia, hematuria, skin rash, spontaneous bleeding, heat or cold intolerance, bowel bladder incontinence, focal motor weakness, paresthesia, depression, suicidal or homicidal ideation, feeling hopelessness.   Past Medical History  Diagnosis Date  . Anxiety   . Hypertension   . Hyperlipidemia   . Depression   . Osteopenia   . Multiple myeloma 03/2012  . Acute renal failure      due to hypercalcemia and suspected multiple myeloma resolved =with IVF's    . Lytic bone lesions on xray   . Acute subdural hematoma 03/2012     recent fall in hospital stay,resolved+  . Normocytic anemia     due to chemo and multiple myeloma,no active bleding  . Cancer     mul;tiple myeloma, bone lesions r 7th rib,   . Arthritis     spine  . Atrial fibrillation   . Radiation 05/09/12-05/18/12    20 gray in 8 Fx's right hip palliative  . Status post chemotherapy     velcade and revlimid    Past Surgical History  Procedure Date  . Breast lumpectomy   . 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 Tonsillectomy     age 56    Current Outpatient Prescriptions  Medication Sig Dispense Refill  . acetaminophen (TYLENOL) 325 MG tablet Take 650 mg by mouth every 4 (four) hours as needed.       Marland Kitchen acyclovir (ZOVIRAX) 400 MG tablet Take 400 mg by mouth daily.      Marland Kitchen ALPRAZolam (XANAX) 1 MG tablet Take 1 tablet (  1 mg total) by mouth at bedtime as needed for sleep. Sleep.  5 tablet  0  . calcium-vitamin D (OSCAL WITH D) 500-200 MG-UNIT per tablet Take 1 tablet by mouth 2 (two) times daily.      . cyclobenzaprine (FLEXERIL) 5 MG tablet Take 5 mg by mouth 3 (three) times daily as needed. Muscle pain      . dexamethasone (DECADRON) 4 MG tablet Take 40 mg by mouth every Tuesday. with chemotherapy drug      . diltiazem (CARDIZEM CD) 180 MG 24 hr capsule Take 1 capsule (180 mg total) by mouth daily.  30 capsule   2  . enoxaparin (LOVENOX) 40 MG/0.4ML injection Inject 0.4 mLs (40 mg total) into the skin daily.  30 Syringe  3  . fentaNYL (DURAGESIC - DOSED MCG/HR) 25 MCG/HR Place 25 mcg onto the skin. As directed      . gabapentin (NEURONTIN) 300 MG capsule Take 300 mg by mouth 2 (two) times daily.      Marland Kitchen HYDROcodone-acetaminophen (VICODIN) 5-500 MG per tablet Take 1 tablet by mouth every 6 (six) hours as needed for pain. pain  10 tablet  0  . lenalidomide (REVLIMID) 15 MG capsule Take 1 capsule (15 mg total) by mouth daily.  15 capsule  0  . Multiple Vitamin (MULTIVITAMIN) capsule Take 1 capsule by mouth daily.      . ondansetron (ZOFRAN) 4 MG tablet Take 4 mg by mouth. Take 1 tablet every 8 hours as needed for nausea      . polyethylene glycol powder (GLYCOLAX/MIRALAX) powder Take 17 g by mouth 2 (two) times daily. Take as directed      . potassium chloride SA (K-DUR,KLOR-CON) 20 MEQ tablet Take 20 mEq by mouth daily.      . prochlorperazine (COMPAZINE) 10 MG tablet Take 10 mg by mouth. Take 1 tablet every 6 hours as directed      . rosuvastatin (CRESTOR) 10 MG tablet Take 10 mg by mouth at bedtime.       . sennosides-docusate sodium (SENOKOT-S) 8.6-50 MG tablet Take 1 tablet by mouth daily.      Marland Kitchen sulfamethoxazole-trimethoprim (BACTRIM DS) 800-160 MG per tablet Take 1 tablet by mouth 3 (three) times a week.      . vitamin C (ASCORBIC ACID) 500 MG tablet Take 500 mg by mouth 2 (two) times daily.      Marland Kitchen warfarin (COUMADIN) 4 MG tablet Take 4 mg by mouth daily. Coumadin 4mg  and 6mg  take as directed      . zinc sulfate 220 MG capsule Take 220 mg by mouth daily.       No current facility-administered medications for this visit.   Facility-Administered Medications Ordered in Other Visits  Medication Dose Route Frequency Provider Last Rate Last Dose  . 0.9 %  sodium chloride infusion   Intravenous Once Exie Parody, MD      . bortezomib SQ (VELCADE) chemo injection 2.5 mg  1.3 mg/m2 (Treatment Plan Actual)  Subcutaneous Once Myrtis Ser, NP   2.5 mg at 07/14/12 1126  . ondansetron (ZOFRAN) tablet 8 mg  8 mg Oral Once Myrtis Ser, NP   8 mg at 07/14/12 1044  . zolendronic acid (ZOMETA) 4 mg in sodium chloride 0.9 % 100 mL IVPB  4 mg Intravenous Once Exie Parody, MD   4 mg at 07/14/12 1105    ALLERGIES:  is allergic to simvastatin.  REVIEW OF SYSTEMS:  The rest of  the 14-point review of system was negative.   Filed Vitals:   07/14/12 0932  BP: 140/96  Pulse: 77  Temp: 97.4 F (36.3 C)  Resp: 20   Wt Readings from Last 3 Encounters:  07/14/12 168 lb 1.6 oz (76.25 kg)  06/16/12 174 lb 4.8 oz (79.062 kg)  06/10/12 170 lb (77.111 kg)   ECOG Performance status: 1  PHYSICAL EXAMINATION:   General:  well-nourished woman, in no acute distress.  Eyes:  no scleral icterus.  ENT:  There were no oropharyngeal lesions.  Neck was without thyromegaly.  Lymphatics:  Negative cervical, supraclavicular or axillary adenopathy.  Respiratory: lungs were clear bilaterally without wheezing or crackles.  Cardiovascular:  Regular rate and rhythm, S1/S2, without murmur, rub or gallop.  There was no pedal edema.  GI:  abdomen was soft, flat, nontender, nondistended, without organomegaly.  Muscoloskeletal:  no spinal tenderness of palpation of vertebral spine.  Skin exam was without echymosis, petichae.  Neuro exam was nonfocal.  Patient was able to get on and off exam table with minimal assistance.  Gait was slow using a walker.  Patient was alerted and oriented.  Attention was good.   Language was appropriate.  Mood was normal without depression.  Speech was not pressured.  Thought content was not tangential.     LABORATORY/RADIOLOGY DATA:  Lab Results  Component Value Date   WBC 6.3 07/14/2012   HGB 11.0* 07/14/2012   HCT 33.6* 07/14/2012   PLT 291 07/14/2012   GLUCOSE 101* 07/14/2012   CHOL 255* 03/03/2012   TRIG 117 03/03/2012   HDL 62 03/03/2012   LDLDIRECT 166* 04/24/2011   LDLCALC 170* 03/03/2012    ALKPHOS 64 07/14/2012   ALT 20 07/14/2012   AST 17 07/14/2012   NA 137 07/14/2012   K 4.4 07/14/2012   CL 109* 07/14/2012   CREATININE 0.9 07/14/2012   BUN 10.0 07/14/2012   CO2 20* 07/14/2012   INR 4.8* 07/12/2012   INR 4.8* 07/12/2012   HGBA1C 6.6* 05/03/2012     ASSESSMENT AND PLAN:   1. Hypertension: She is on diltiazem per cardiology.  2. Hyperlipidemia: She is on rosuvastatin per PCP.  3. Dconditioning due to bone pain: she is now home from SNF.  She is much more ambulatory compared to before.  4. Lytic bone lesions causing bone pain: much improved with radiation. I will continue with monthly Zometa.  5. Normocytic anemia: work up in the recent past did not show iron/VitB12 deficiency or hypothyroidism. Her anemia is due to myeloma and chemo. There is no active bleeding. There is no indication for transfusion.  6. Acute subdural hematoma from recent fall during hospital stay in July 2013: Resolved. She is finished with Prednisone taper.  7. Nausea: improved. She has Compazine and Zofran prn.  8. Afib:  Rate control with Diltiazem.  She is on Coumadin per PCP.  8. Moderate calorie/protein malnutrition: From myeloma, and its treatment. This is also improved.  We mentioned Marinol.  She does not want this yet. If her weight continues to decrease, we need to consider this in the future.  9. Multiple myeloma: S/p 4 cycles of chemo with every good partial response with M-spike decreased from 4 gm/dL and now undetectable. She does not have dose limiting toxicity.  She only has grade 1 fatigue, grade 1 anemia, grade 1 cramp.   I recommended referral to [facility] program to be evaluated for potential transplant.  If they deem her not candidate, then  we will continue with induction chemo for about 9 months total and then start maintenance chemo with just Revlimid.   For now, we will continue with current regimen:  - Velcade 1.3mg /m2 SQ once a week (3 weeks on, 1 week off) and oral Revlmid  15mg  PO days 1-21 (with 7 days off) She is to continue with oral Dexamethasone 40mg  PO once a week.  - Continue Acyclovir 200 mg, by mouth, once daily, everyday (to prevent viral infection).  - Continue Bactrim double strength (DS), every Monday, Wednesday, Friday (to prevent bacterial infection).  - She is on Coumadin for afib per PCP.   10. Follow up: in one month.      The length of time of the face-to-face encounter was 25  minutes. More than 50% of time was spent counseling and coordination of care.

## 2012-07-14 NOTE — Patient Instructions (Addendum)
1.  Multiple myeloma. 2.  Treatment:  Velcade subcutaneous injection once a week; 3 weeks on, 1 week of.  Day 1-21 oral Revlimid (then 7 days off).  Dexamethasone 40mg  by mouth once a week. 3.  Preventative medications:  * Acyclovir 400mg  by mouth once a day (to prevent viral infection)  * Bactrim DS, one tablet every Mon/Wed/Friday to prevent bacterial infection.  * Lovenox 40mg  subcutaneous injection daily to prevent blood clot.  4.  Referral to bone marrow transplant?

## 2012-07-14 NOTE — Patient Instructions (Addendum)
[facility] Discharge Instructions for Patients Receiving Chemotherapy  Today you received the following chemotherapy agents Zometa and Velcade  To help prevent nausea and vomiting after your treatment, we encourage you to take your nausea medication as prescribed.   If you develop nausea and vomiting that is not controlled by your nausea medication, call the clinic. If it is after clinic hours your family physician or the after hours number for the clinic or go to the Emergency Department.   BELOW ARE SYMPTOMS THAT SHOULD BE REPORTED IMMEDIATELY:  *FEVER GREATER THAN 100.5 F  *CHILLS WITH OR WITHOUT FEVER  NAUSEA AND VOMITING THAT IS NOT CONTROLLED WITH YOUR NAUSEA MEDICATION  *UNUSUAL SHORTNESS OF BREATH  *UNUSUAL BRUISING OR BLEEDING  TENDERNESS IN MOUTH AND THROAT WITH OR WITHOUT PRESENCE OF ULCERS  *URINARY PROBLEMS  *BOWEL PROBLEMS  UNUSUAL RASH Items with * indicate a potential emergency and should be followed up as soon as possible.  Feel free to call the clinic you have any questions or concerns. The clinic phone number is [phone].   I have been informed and understand all the instructions given to me. I know to contact the clinic, my physician, or go to the Emergency Department if any problems should occur. I do not have any questions at this time, but understand that I may call the clinic during office hours   should I have any questions or need assistance in obtaining follow up care.

## 2012-07-18 LAB — PROTEIN ELECTROPHORESIS, SERUM
Albumin ELP: 56.3 % (ref 55.8–66.1)
Alpha-1-Globulin: 6.5 % — ABNORMAL HIGH (ref 2.9–4.9)
Alpha-2-Globulin: 16.3 % — ABNORMAL HIGH (ref 7.1–11.8)
Beta Globulin: 7 % (ref 4.7–7.2)
Total Protein, Serum Electrophoresis: 5.9 g/dL — ABNORMAL LOW (ref 6.0–8.3)

## 2012-07-18 LAB — KAPPA/LAMBDA LIGHT CHAINS
Kappa free light chain: 1.01 mg/dL (ref 0.33–1.94)
Kappa:Lambda Ratio: 1.38 (ref 0.26–1.65)
Lambda Free Lght Chn: 0.73 mg/dL (ref 0.57–2.63)

## 2012-07-20 ENCOUNTER — Other Ambulatory Visit: Payer: Self-pay | Admitting: Oncology

## 2012-07-21 ENCOUNTER — Other Ambulatory Visit (HOSPITAL_BASED_OUTPATIENT_CLINIC_OR_DEPARTMENT_OTHER): Payer: Medicare Other | Admitting: Lab

## 2012-07-21 ENCOUNTER — Ambulatory Visit (HOSPITAL_BASED_OUTPATIENT_CLINIC_OR_DEPARTMENT_OTHER): Payer: Medicare Other

## 2012-07-21 VITALS — BP 123/84 | HR 80 | Temp 98.0°F

## 2012-07-21 DIAGNOSIS — R52 Pain, unspecified: Secondary | ICD-10-CM

## 2012-07-21 DIAGNOSIS — Z5112 Encounter for antineoplastic immunotherapy: Secondary | ICD-10-CM

## 2012-07-21 DIAGNOSIS — C9000 Multiple myeloma not having achieved remission: Secondary | ICD-10-CM

## 2012-07-21 LAB — CBC WITH DIFFERENTIAL/PLATELET
BASO%: 0.5 % (ref 0.0–2.0)
Basophils Absolute: 0 10*3/uL (ref 0.0–0.1)
Eosinophils Absolute: 0.3 10*3/uL (ref 0.0–0.5)
HCT: 31.8 % — ABNORMAL LOW (ref 34.8–46.6)
HGB: 10.6 g/dL — ABNORMAL LOW (ref 11.6–15.9)
LYMPH%: 18.9 % (ref 14.0–49.7)
MCHC: 33.5 g/dL (ref 31.5–36.0)
MONO#: 1.1 10*3/uL — ABNORMAL HIGH (ref 0.1–0.9)
NEUT%: 63.2 % (ref 38.4–76.8)
Platelets: 185 10*3/uL (ref 145–400)
WBC: 7.9 10*3/uL (ref 3.9–10.3)

## 2012-07-21 LAB — COMPREHENSIVE METABOLIC PANEL (CC13)
Albumin: 3.4 g/dL — ABNORMAL LOW (ref 3.5–5.0)
BUN: 20 mg/dL (ref 7.0–26.0)
CO2: 25 mEq/L (ref 22–29)
Calcium: 9.4 mg/dL (ref 8.4–10.4)
Chloride: 104 mEq/L (ref 98–107)
Glucose: 89 mg/dl (ref 70–99)
Potassium: 4.1 mEq/L (ref 3.5–5.1)
Total Protein: 5.6 g/dL — ABNORMAL LOW (ref 6.4–8.3)

## 2012-07-21 MED ORDER — BORTEZOMIB CHEMO SQ INJECTION 3.5 MG (2.5MG/ML)
1.3000 mg/m2 | Freq: Once | INTRAMUSCULAR | Status: AC
  Administered 2012-07-21: 2.5 mg via SUBCUTANEOUS
  Filled 2012-07-21: qty 2.5

## 2012-07-21 MED ORDER — ONDANSETRON HCL 8 MG PO TABS
8.0000 mg | ORAL_TABLET | Freq: Once | ORAL | Status: AC
  Administered 2012-07-21: 8 mg via ORAL

## 2012-07-21 NOTE — Patient Instructions (Addendum)
[facility] Discharge Instructions for Patients Receiving Chemotherapy  Today you received the following chemotherapy agents Velcade.  To help prevent nausea and vomiting after your treatment, we encourage you to take your nausea medication as prescribed.   If you develop nausea and vomiting that is not controlled by your nausea medication, call the clinic. If it is after clinic hours your family physician or the after hours number for the clinic or go to the Emergency Department.   BELOW ARE SYMPTOMS THAT SHOULD BE REPORTED IMMEDIATELY:  *FEVER GREATER THAN 100.5 F  *CHILLS WITH OR WITHOUT FEVER  NAUSEA AND VOMITING THAT IS NOT CONTROLLED WITH YOUR NAUSEA MEDICATION  *UNUSUAL SHORTNESS OF BREATH  *UNUSUAL BRUISING OR BLEEDING  TENDERNESS IN MOUTH AND THROAT WITH OR WITHOUT PRESENCE OF ULCERS  *URINARY PROBLEMS  *BOWEL PROBLEMS  UNUSUAL RASH Items with * indicate a potential emergency and should be followed up as soon as possible.  One of the nurses will contact you 24 hours after your treatment. Please let the nurse know about any problems that you may have experienced. Feel free to call the clinic you have any questions or concerns. The clinic phone number is [phone].   I have been informed and understand all the instructions given to me. I know to contact the clinic, my physician, or go to the Emergency Department if any problems should occur. I do not have any questions at this time, but understand that I may call the clinic during office hours   should I have any questions or need assistance in obtaining follow up care.    __________________________________________  _____________  __________ Signature of Patient or Authorized Representative            Date                   Time    __________________________________________ Nurse's Signature    

## 2012-07-25 ENCOUNTER — Telehealth: Payer: Self-pay

## 2012-07-25 ENCOUNTER — Ambulatory Visit: Payer: Self-pay

## 2012-07-25 ENCOUNTER — Telehealth: Payer: Self-pay | Admitting: *Deleted

## 2012-07-25 DIAGNOSIS — I749 Embolism and thrombosis of unspecified artery: Secondary | ICD-10-CM

## 2012-07-25 NOTE — Progress Notes (Signed)
[name] with [facility] advised to decrease medication to 4 mg daily and recheck in 1 week.

## 2012-07-25 NOTE — Telephone Encounter (Signed)
Katie from [facility] checked [name]'s INR. See Anticoagulation encounter.

## 2012-07-25 NOTE — Telephone Encounter (Signed)
Husband called to confirm dose of coumadin.  States [facility] RN checked pt's INR at home today and he thinks they were instructed to take 4 mg a day and recheck next Monday.  I informed him Dr. Gaylyn Rong not the one that is managing coumadin but I read note from CMA in EMR for PCP which clearly states instructions given for 4 mg coumadin daily and recheck INR in one week.  He verbalized understanding.

## 2012-07-25 NOTE — Patient Instructions (Signed)
Decrease coumadin regimen to 4mg  daily, have INR re-drawn on Monday.

## 2012-07-25 NOTE — Telephone Encounter (Signed)
Patients husband aware of coumadin change until next Monday and [facility] to redraw INR.

## 2012-07-26 ENCOUNTER — Other Ambulatory Visit: Payer: Self-pay | Admitting: Physician Assistant

## 2012-07-26 LAB — PROTEIN ELECTROPHORESIS, SERUM
Alpha-2-Globulin: 14.7 % — ABNORMAL HIGH (ref 7.1–11.8)
Beta 2: 4.8 % (ref 3.2–6.5)
Beta Globulin: 7.8 % — ABNORMAL HIGH (ref 4.7–7.2)
Gamma Globulin: 8.4 % — ABNORMAL LOW (ref 11.1–18.8)

## 2012-07-27 ENCOUNTER — Telehealth: Payer: Self-pay | Admitting: *Deleted

## 2012-07-27 DIAGNOSIS — Z1231 Encounter for screening mammogram for malignant neoplasm of breast: Secondary | ICD-10-CM

## 2012-07-27 NOTE — Telephone Encounter (Signed)
LM of MD instructions

## 2012-07-27 NOTE — Telephone Encounter (Signed)
Pt called stating that she called downstairs to schedule her mammogram and informed then that she has bone cancer and they wouldn't schedule her mammogram. Please advise on [name] getting her mammogram scheduled.

## 2012-07-27 NOTE — Telephone Encounter (Signed)
Ok will have to order a diagnostic mammo instead.  It will have to be done in [place].

## 2012-07-28 ENCOUNTER — Other Ambulatory Visit (HOSPITAL_BASED_OUTPATIENT_CLINIC_OR_DEPARTMENT_OTHER): Payer: Medicare Other | Admitting: Lab

## 2012-07-28 ENCOUNTER — Telehealth: Payer: Self-pay | Admitting: *Deleted

## 2012-07-28 ENCOUNTER — Other Ambulatory Visit: Payer: Self-pay | Admitting: *Deleted

## 2012-07-28 ENCOUNTER — Ambulatory Visit (HOSPITAL_BASED_OUTPATIENT_CLINIC_OR_DEPARTMENT_OTHER): Payer: Medicare Other

## 2012-07-28 VITALS — BP 146/77 | HR 99 | Temp 97.6°F

## 2012-07-28 DIAGNOSIS — C9000 Multiple myeloma not having achieved remission: Secondary | ICD-10-CM

## 2012-07-28 DIAGNOSIS — Z139 Encounter for screening, unspecified: Secondary | ICD-10-CM

## 2012-07-28 DIAGNOSIS — Z5112 Encounter for antineoplastic immunotherapy: Secondary | ICD-10-CM

## 2012-07-28 LAB — CBC WITH DIFFERENTIAL/PLATELET
BASO%: 0.1 % (ref 0.0–2.0)
EOS%: 5.3 % (ref 0.0–7.0)
HCT: 35.1 % (ref 34.8–46.6)
LYMPH%: 14.2 % (ref 14.0–49.7)
MCH: 30.4 pg (ref 25.1–34.0)
MCHC: 32.2 g/dL (ref 31.5–36.0)
MONO%: 15.5 % — ABNORMAL HIGH (ref 0.0–14.0)
NEUT#: 7.7 10*3/uL — ABNORMAL HIGH (ref 1.5–6.5)
NEUT%: 64.9 % (ref 38.4–76.8)
Platelets: 162 10*3/uL (ref 145–400)
RBC: 3.72 10*6/uL (ref 3.70–5.45)
RDW: 15.5 % — ABNORMAL HIGH (ref 11.2–14.5)
nRBC: 0 % (ref 0–0)

## 2012-07-28 MED ORDER — ONDANSETRON HCL 8 MG PO TABS
8.0000 mg | ORAL_TABLET | Freq: Once | ORAL | Status: AC
  Administered 2012-07-28: 8 mg via ORAL

## 2012-07-28 MED ORDER — DILTIAZEM HCL ER COATED BEADS 180 MG PO CP24: 180.0000 mg | ORAL_CAPSULE | Freq: Every day | ORAL | Status: DC

## 2012-07-28 MED ORDER — BORTEZOMIB CHEMO SQ INJECTION 3.5 MG (2.5MG/ML)
1.3000 mg/m2 | Freq: Once | INTRAMUSCULAR | Status: AC
  Administered 2012-07-28: 2.5 mg via SUBCUTANEOUS
  Filled 2012-07-28: qty 2.5

## 2012-07-28 NOTE — Telephone Encounter (Signed)
mammo ordered entered

## 2012-07-28 NOTE — Patient Instructions (Signed)
[facility] Discharge Instructions for Patients Receiving Chemotherapy  Today you received the following chemotherapy agents Velcade  To help prevent nausea and vomiting after your treatment, we encourage you to take your nausea medication as prescribed.   If you develop nausea and vomiting that is not controlled by your nausea medication, call the clinic. If it is after clinic hours your family physician or the after hours number for the clinic or go to the Emergency Department.   BELOW ARE SYMPTOMS THAT SHOULD BE REPORTED IMMEDIATELY:  *FEVER GREATER THAN 100.5 F  *CHILLS WITH OR WITHOUT FEVER  NAUSEA AND VOMITING THAT IS NOT CONTROLLED WITH YOUR NAUSEA MEDICATION  *UNUSUAL SHORTNESS OF BREATH  *UNUSUAL BRUISING OR BLEEDING  TENDERNESS IN MOUTH AND THROAT WITH OR WITHOUT PRESENCE OF ULCERS  *URINARY PROBLEMS  *BOWEL PROBLEMS  UNUSUAL RASH Items with * indicate a potential emergency and should be followed up as soon as possible.  Feel free to call the clinic you have any questions or concerns. The clinic phone number is [phone].   I have been informed and understand all the instructions given to me. I know to contact the clinic, my physician, or go to the Emergency Department if any problems should occur. I do not have any questions at this time, but understand that I may call the clinic during office hours   should I have any questions or need assistance in obtaining follow up care.    

## 2012-07-29 ENCOUNTER — Encounter (HOSPITAL_BASED_OUTPATIENT_CLINIC_OR_DEPARTMENT_OTHER): Payer: Medicare Other

## 2012-07-30 ENCOUNTER — Other Ambulatory Visit: Payer: Self-pay | Admitting: Family Medicine

## 2012-08-01 ENCOUNTER — Ambulatory Visit: Payer: Self-pay | Admitting: Family Medicine

## 2012-08-01 DIAGNOSIS — I749 Embolism and thrombosis of unspecified artery: Secondary | ICD-10-CM

## 2012-08-01 LAB — POCT INR: INR: 5.41

## 2012-08-02 ENCOUNTER — Ambulatory Visit (INDEPENDENT_AMBULATORY_CARE_PROVIDER_SITE_OTHER): Payer: Medicare Other | Admitting: Family Medicine

## 2012-08-02 ENCOUNTER — Telehealth: Payer: Self-pay | Admitting: *Deleted

## 2012-08-02 ENCOUNTER — Encounter: Payer: Self-pay | Admitting: Family Medicine

## 2012-08-02 VITALS — BP 129/89 | HR 100 | Ht 64.0 in | Wt 166.0 lb

## 2012-08-02 DIAGNOSIS — C9000 Multiple myeloma not having achieved remission: Secondary | ICD-10-CM

## 2012-08-02 DIAGNOSIS — I10 Essential (primary) hypertension: Secondary | ICD-10-CM

## 2012-08-02 MED ORDER — ACETAMINOPHEN 500 MG PO TABS: 500.0000 mg | ORAL_TABLET | ORAL | Status: DC | PRN

## 2012-08-02 MED ORDER — FENTANYL 25 MCG/HR TD PT72: 1.0000 | MEDICATED_PATCH | TRANSDERMAL | Status: DC

## 2012-08-02 NOTE — Telephone Encounter (Signed)
I am ok with filling. Lets verify with pharmacy proper dose, etc.

## 2012-08-02 NOTE — Patient Instructions (Addendum)
Schedule for medication review

## 2012-08-02 NOTE — Progress Notes (Signed)
  Subjective:    Patient ID: [name], female    DOB: 05/02/1936, 76 y.o.   MRN: [id number]  HPI Here to followup for her blood pressure. She's had no chest pain or shortness of breath. She said she was given a prescription for verapamil as well as Tribenzor. She was out of her Tribenzor for several days and was just taking up around the verapamil. She said her pressure was running very high so she actually took her Tribenzor has only been taking that the last couple days. Her blood pressures been much better. She wasn't sure she should take both medications or not. She said when she was at the skilled nursing facility she was never even asked what blood pressure pill she normally takes at home. She has a diagnosis of multiple myeloma and is followed regularly at the cancer Center.   Review of Systems     Objective:   Physical Exam  Constitutional: She is oriented to person, place, and time. She appears well-developed and well-nourished.  HENT:  Head: Normocephalic and atraumatic.  Neck: Neck supple. No JVD present. No thyromegaly present.  Cardiovascular: Normal rate, regular rhythm and normal heart sounds.   Pulmonary/Chest: Effort normal and breath sounds normal.  Neurological: She is alert and oriented to person, place, and time.  Skin: Skin is warm and dry.  Psychiatric: She has a normal mood and affect. Her behavior is normal.          Assessment & Plan:  Hypertension-well-controlled today on just her Tribenzor. I removed the verapamil from her medication list. She really doesn't need to be on 2 calcium channel blockers anyway. And she is tolerated the amlodipine in the Tribenzor well. She denies any recent problems or palpitations. She does have a history of anxiety. Followup in one month to make sure blood pressures still well controlled.  I really want her to bring in all of her medications with her at her next office visit so we can do a medication review. She says right  now she says confused about what she should or shouldn't be taking. Many of her bottles have different physicians names on them. Some of them are from hospital. Some of them are from the skilled nursing facility. Some are from her specialists.

## 2012-08-02 NOTE — Telephone Encounter (Signed)
Pt calls and states that the [facility] doc put her on Fentynol Patches and was told by them to get refilled through you. Pt request a refill please

## 2012-08-02 NOTE — Telephone Encounter (Signed)
Fentynol Patch place 1 patch onto the skin every 3 days.  Last fill was 04/21/2012

## 2012-08-04 ENCOUNTER — Other Ambulatory Visit: Payer: Self-pay | Admitting: *Deleted

## 2012-08-04 ENCOUNTER — Encounter (HOSPITAL_BASED_OUTPATIENT_CLINIC_OR_DEPARTMENT_OTHER): Payer: Medicare Other | Attending: General Surgery

## 2012-08-04 DIAGNOSIS — L89109 Pressure ulcer of unspecified part of back, unspecified stage: Secondary | ICD-10-CM | POA: Insufficient documentation

## 2012-08-04 DIAGNOSIS — L8993 Pressure ulcer of unspecified site, stage 3: Secondary | ICD-10-CM | POA: Insufficient documentation

## 2012-08-04 MED ORDER — LENALIDOMIDE 15 MG PO CAPS: 15.0000 mg | ORAL_CAPSULE | Freq: Every day | ORAL | Status: DC

## 2012-08-04 NOTE — Telephone Encounter (Signed)
THIS REFILL REQUEST FOR REVLIMID WAS PLACED IN DR.HA'S ACTIVE WORK FOLDER. 

## 2012-08-08 ENCOUNTER — Ambulatory Visit: Payer: Self-pay | Admitting: Family Medicine

## 2012-08-08 ENCOUNTER — Telehealth: Payer: Self-pay | Admitting: *Deleted

## 2012-08-08 DIAGNOSIS — I749 Embolism and thrombosis of unspecified artery: Secondary | ICD-10-CM

## 2012-08-08 LAB — PROTIME-INR: INR: 7.5 — AB (ref 0.9–1.1)

## 2012-08-08 NOTE — Telephone Encounter (Signed)
Patient took 1/2 tab of coumadin today.  Do they need to change the coumadin instructions again

## 2012-08-08 NOTE — Telephone Encounter (Signed)
See anticoag flowsheet

## 2012-08-08 NOTE — Telephone Encounter (Signed)
[facility]-[phone] INR 7.5 today but is drawing blood as well. Skin tear on her arm that keeps bleeding- applying tegraderm and pressure

## 2012-08-08 NOTE — Telephone Encounter (Signed)
Just have her skip tomorrow then. It is really better if she takes her coumadin in the evening. That way we can adjust her dose for the day if needed.

## 2012-08-08 NOTE — Telephone Encounter (Signed)
Per lab and [facility] Nurse-[phone] PT  49.8 INR 6.26

## 2012-08-08 NOTE — Telephone Encounter (Signed)
Tegaderm is ok for would.  I put in new coumadin instruction.

## 2012-08-08 NOTE — Telephone Encounter (Signed)
[name] advised of coumadin dose. She would also like to know if the Tegaderm is ok for wound?

## 2012-08-09 ENCOUNTER — Telehealth: Payer: Self-pay | Admitting: *Deleted

## 2012-08-09 NOTE — Telephone Encounter (Signed)
[facility] faxed confirmation of prescription shipment.  Revlimid was shipped 08-08-2012 with next business day delivery.

## 2012-08-11 ENCOUNTER — Encounter: Payer: Self-pay | Admitting: Oncology

## 2012-08-11 ENCOUNTER — Telehealth: Payer: Self-pay | Admitting: Oncology

## 2012-08-11 ENCOUNTER — Other Ambulatory Visit (HOSPITAL_BASED_OUTPATIENT_CLINIC_OR_DEPARTMENT_OTHER): Payer: Medicare Other | Admitting: Lab

## 2012-08-11 ENCOUNTER — Ambulatory Visit (HOSPITAL_BASED_OUTPATIENT_CLINIC_OR_DEPARTMENT_OTHER): Payer: Medicare Other

## 2012-08-11 ENCOUNTER — Ambulatory Visit (HOSPITAL_BASED_OUTPATIENT_CLINIC_OR_DEPARTMENT_OTHER): Payer: Medicare Other | Admitting: Oncology

## 2012-08-11 VITALS — BP 122/79 | HR 74 | Temp 96.4°F | Resp 20 | Ht 64.0 in | Wt 165.4 lb

## 2012-08-11 DIAGNOSIS — C9030 Solitary plasmacytoma not having achieved remission: Secondary | ICD-10-CM

## 2012-08-11 DIAGNOSIS — G4700 Insomnia, unspecified: Secondary | ICD-10-CM

## 2012-08-11 DIAGNOSIS — C9000 Multiple myeloma not having achieved remission: Secondary | ICD-10-CM

## 2012-08-11 DIAGNOSIS — F411 Generalized anxiety disorder: Secondary | ICD-10-CM

## 2012-08-11 DIAGNOSIS — Z780 Asymptomatic menopausal state: Secondary | ICD-10-CM

## 2012-08-11 DIAGNOSIS — D649 Anemia, unspecified: Secondary | ICD-10-CM

## 2012-08-11 DIAGNOSIS — E669 Obesity, unspecified: Secondary | ICD-10-CM

## 2012-08-11 DIAGNOSIS — E860 Dehydration: Secondary | ICD-10-CM

## 2012-08-11 DIAGNOSIS — F329 Major depressive disorder, single episode, unspecified: Secondary | ICD-10-CM

## 2012-08-11 DIAGNOSIS — Z5112 Encounter for antineoplastic immunotherapy: Secondary | ICD-10-CM

## 2012-08-11 DIAGNOSIS — N63 Unspecified lump in unspecified breast: Secondary | ICD-10-CM

## 2012-08-11 DIAGNOSIS — M545 Low back pain: Secondary | ICD-10-CM

## 2012-08-11 DIAGNOSIS — N179 Acute kidney failure, unspecified: Secondary | ICD-10-CM

## 2012-08-11 DIAGNOSIS — R7989 Other specified abnormal findings of blood chemistry: Secondary | ICD-10-CM

## 2012-08-11 DIAGNOSIS — S065X9A Traumatic subdural hemorrhage with loss of consciousness of unspecified duration, initial encounter: Secondary | ICD-10-CM

## 2012-08-11 DIAGNOSIS — E785 Hyperlipidemia, unspecified: Secondary | ICD-10-CM

## 2012-08-11 DIAGNOSIS — I10 Essential (primary) hypertension: Secondary | ICD-10-CM

## 2012-08-11 DIAGNOSIS — K5901 Slow transit constipation: Secondary | ICD-10-CM

## 2012-08-11 DIAGNOSIS — M25559 Pain in unspecified hip: Secondary | ICD-10-CM

## 2012-08-11 DIAGNOSIS — M899 Disorder of bone, unspecified: Secondary | ICD-10-CM

## 2012-08-11 LAB — CBC WITH DIFFERENTIAL/PLATELET
Eosinophils Absolute: 0 10*3/uL (ref 0.0–0.5)
HGB: 11.1 g/dL — ABNORMAL LOW (ref 11.6–15.9)
MCV: 92.6 fL (ref 79.5–101.0)
MONO#: 1.2 10*3/uL — ABNORMAL HIGH (ref 0.1–0.9)
MONO%: 12.3 % (ref 0.0–14.0)
NEUT#: 6.9 10*3/uL — ABNORMAL HIGH (ref 1.5–6.5)
RBC: 3.64 10*6/uL — ABNORMAL LOW (ref 3.70–5.45)
RDW: 15.5 % — ABNORMAL HIGH (ref 11.2–14.5)
WBC: 9.9 10*3/uL (ref 3.9–10.3)
lymph#: 1.7 10*3/uL (ref 0.9–3.3)
nRBC: 0 % (ref 0–0)

## 2012-08-11 LAB — COMPREHENSIVE METABOLIC PANEL (CC13)
ALT: 28 U/L (ref 0–55)
Albumin: 3.8 g/dL (ref 3.5–5.0)
CO2: 25 mEq/L (ref 22–29)
Potassium: 3.5 mEq/L (ref 3.5–5.1)
Sodium: 137 mEq/L (ref 136–145)
Total Bilirubin: 0.43 mg/dL (ref 0.20–1.20)
Total Protein: 6.6 g/dL (ref 6.4–8.3)

## 2012-08-11 MED ORDER — ONDANSETRON HCL 8 MG PO TABS
8.0000 mg | ORAL_TABLET | Freq: Once | ORAL | Status: AC
  Administered 2012-08-11: 8 mg via ORAL

## 2012-08-11 MED ORDER — ZOLEDRONIC ACID 4 MG/5ML IV CONC
4.0000 mg | Freq: Once | INTRAVENOUS | Status: AC
  Administered 2012-08-11: 4 mg via INTRAVENOUS
  Filled 2012-08-11: qty 5

## 2012-08-11 MED ORDER — BORTEZOMIB CHEMO SQ INJECTION 3.5 MG (2.5MG/ML)
1.3000 mg/m2 | Freq: Once | INTRAMUSCULAR | Status: AC
  Administered 2012-08-11: 2.5 mg via SUBCUTANEOUS
  Filled 2012-08-11: qty 2.5

## 2012-08-11 MED ORDER — SODIUM CHLORIDE 0.9 % IV SOLN
Freq: Once | INTRAVENOUS | Status: AC
  Administered 2012-08-11: 14:00:00 via INTRAVENOUS

## 2012-08-11 NOTE — Patient Instructions (Signed)
[facility] Discharge Instructions for Patients Receiving Chemotherapy  Today you received the following chemotherapy agents: Zometa, Velcade  To help prevent nausea and vomiting after your treatment, we encourage you to take your nausea medication.  Take it as often as prescribed.     If you develop nausea and vomiting that is not controlled by your nausea medication, call the clinic. If it is after clinic hours your family physician or the after hours number for the clinic or go to the Emergency Department.   BELOW ARE SYMPTOMS THAT SHOULD BE REPORTED IMMEDIATELY:  *FEVER GREATER THAN 100.5 F  *CHILLS WITH OR WITHOUT FEVER  NAUSEA AND VOMITING THAT IS NOT CONTROLLED WITH YOUR NAUSEA MEDICATION  *UNUSUAL SHORTNESS OF BREATH  *UNUSUAL BRUISING OR BLEEDING  TENDERNESS IN MOUTH AND THROAT WITH OR WITHOUT PRESENCE OF ULCERS  *URINARY PROBLEMS  *BOWEL PROBLEMS  UNUSUAL RASH Items with * indicate a potential emergency and should be followed up as soon as possible.  Feel free to call the clinic you have any questions or concerns. The clinic phone number is [phone].   I have been informed and understand all the instructions given to me. I know to contact the clinic, my physician, or go to the Emergency Department if any problems should occur. I do not have any questions at this time, but understand that I may call the clinic during office hours   should I have any questions or need assistance in obtaining follow up care.    __________________________________________  _____________  __________ Signature of Patient or Authorized Representative            Date                   Time    __________________________________________ Nurse's Signature    

## 2012-08-11 NOTE — Telephone Encounter (Signed)
See other message

## 2012-08-11 NOTE — Progress Notes (Signed)
[facility]  Telephone:([phone]) [phone] [phone]   OFFICE PROGRESS NOTE   Cc:  METHENEY,CATHERINE, MD  DIAGNOSIS: IgA kappa multiple myeloma. She presented with diffuse lytic bone lesions, renal insufficiency, hypercalcemia, anemia. Her M-spike was 4.06 gm/dL. Her IgA was elevated at 4,020 mg/dL; IgG 353; IgM 5; serum kappa was 2.11; lambda 0.60; serum kappa to lambda ratio elevated at 3.06. Spot urine showed elevated kappa:lambda at 579; and positive for Bence Jones protein. Bone marrow biopsy on 04/08/2012 showed 92% plasma cell. Cytogenetics showed multiple chromosomal abnormalities; additional chromosomal material on chromosome 8p; 14q; loss of chromosome 8 and 13. 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   CURRENT THERAPY: Started on SQ Velcade 1.3mg /m2 on 04/12/2012; weekly; 3 weeks on; 1 week off. She started on Revlimid 15mg  PO daily d1-21 on 04/21/2012. She is also on Dexamethasone 40mg  PO once weekly. She also received palliative radiation 20 Gy in 8 fractions to the bilateral hips.    INTERVAL HISTORY: [name] 76 y.o. female returns for regular follow up with her husband. She is getting stronger. No back pain. Appetite is improving and weight is stable. She is able to ambulate around the house everyday by herself using a walker.  She also leaves the house several times a week such as going to get her hair done.   Patient denies fever, fatigue, headache, visual changes, confusion, drenching night sweats, palpable lymph node swelling, mucositis, odynophagia, dysphagia, nausea vomiting, jaundice, chest pain, palpitation, shortness of breath, dyspnea on exertion, productive cough, gum bleeding, epistaxis, hematemesis, hemoptysis, abdominal pain, abdominal swelling, early satiety, melena, hematochezia, hematuria, skin rash, spontaneous bleeding, heat or cold intolerance, bowel bladder incontinence, focal motor weakness, paresthesia, depression, suicidal or  homicidal ideation, feeling hopelessness.   Past Medical History  Diagnosis Date  . Anxiety   . Hypertension   . Hyperlipidemia   . Depression   . Osteopenia   . Multiple myeloma 03/2012  . Acute renal failure      due to hypercalcemia and suspected multiple myeloma resolved =with IVF's    . Lytic bone lesions on xray   . Acute subdural hematoma 03/2012     recent fall in hospital stay,resolved+  . Normocytic anemia     due to chemo and multiple myeloma,no active bleding  . Cancer     mul;tiple myeloma, bone lesions r 7th rib,   . Arthritis     spine  . Atrial fibrillation   . Radiation 05/09/12-05/18/12    20 gray in 8 Fx's right hip palliative  . Status post chemotherapy     velcade and revlimid    Past Surgical History  Procedure Date  . Breast lumpectomy   . 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 Tonsillectomy     age 81    Current Outpatient Prescriptions  Medication Sig Dispense Refill  . acetaminophen (TYLENOL) 500 MG tablet Take 1-2 tablets (500-1,000 mg total) by mouth every 4 (four) hours as needed. No more than 6 tabs in one day.  50 tablet  2  . acyclovir (ZOVIRAX) 400 MG tablet Take 400 mg by mouth daily.      Marland Kitchen ALPRAZolam (XANAX) 1 MG tablet TAKE 1 TABLET DAILY AS NEEDED FOR SLEEP  60 tablet  0  . calcium-vitamin D (OSCAL WITH D) 500-200 MG-UNIT per tablet Take 1 tablet by mouth 2 (two) times daily.      Marland Kitchen  cyclobenzaprine (FLEXERIL) 5 MG tablet Take 5 mg by mouth 3 (three) times daily as needed. Muscle pain      . dexamethasone (DECADRON) 4 MG tablet Take 40 mg by mouth every Tuesday. with chemotherapy drug      . fentaNYL (DURAGESIC - DOSED MCG/HR) 25 MCG/HR Place 1 patch (25 mcg total) onto the skin every 3 (three) days. As directed  10 patch  0  . HYDROcodone-acetaminophen (VICODIN) 5-500 MG per tablet Take 1 tablet by mouth every 6 (six) hours as needed for pain.  pain  10 tablet  0  . lenalidomide (REVLIMID) 15 MG capsule Take 1 capsule (15 mg total) by mouth daily.  15 capsule  0  . ondansetron (ZOFRAN) 4 MG tablet Take 4 mg by mouth. Take 1 tablet every 8 hours as needed for nausea      . potassium chloride SA (K-DUR,KLOR-CON) 20 MEQ tablet Take 20 mEq by mouth daily.      . prochlorperazine (COMPAZINE) 10 MG tablet Take 10 mg by mouth. Take 1 tablet every 6 hours as directed      . rosuvastatin (CRESTOR) 10 MG tablet Take 10 mg by mouth at bedtime.       . sulfamethoxazole-trimethoprim (BACTRIM DS) 800-160 MG per tablet Take 1 tablet by mouth 3 (three) times a week.      Marya Landry 40-10-12.5 MG TABS TAKE 1 TABLET DAILY  30 tablet  2  . warfarin (COUMADIN) 4 MG tablet Take 4 mg by mouth daily. Coumadin 4mg  and 6mg  take as directed       No current facility-administered medications for this visit.   Facility-Administered Medications Ordered in Other Visits  Medication Dose Route Frequency Provider Last Rate Last Dose  . [COMPLETED] 0.9 %  sodium chloride infusion   Intravenous Once Exie Parody, MD      . [address] bortezomib SQ (VELCADE) chemo injection 2.5 mg  1.3 mg/m2 (Treatment Plan Actual) Subcutaneous Once Exie Parody, MD   2.5 mg at 08/11/12 1426  . [COMPLETED] ondansetron (ZOFRAN) tablet 8 mg  8 mg Oral Once Exie Parody, MD   8 mg at 08/11/12 1415  . [COMPLETED] zolendronic acid (ZOMETA) 4 mg in sodium chloride 0.9 % 100 mL IVPB  4 mg Intravenous Once Exie Parody, MD   4 mg at 08/11/12 1425    ALLERGIES:  is allergic to simvastatin.  REVIEW OF SYSTEMS:  The rest of the 14-point review of system was negative.   Filed Vitals:   08/11/12 1314  BP: 122/79  Pulse: 74  Temp: 96.4 F (35.8 C)  Resp: 20   Wt Readings from Last 3 Encounters:  08/11/12 165 lb 6.4 oz (75.025 kg)  08/02/12 166 lb (75.297 kg)  07/14/12 168 lb 1.6 oz (76.25 kg)   ECOG Performance status: 1  PHYSICAL EXAMINATION:   General:  well-nourished woman, in no acute  distress.  Eyes:  no scleral icterus.  ENT:  There were no oropharyngeal lesions.  Neck was without thyromegaly.  Lymphatics:  Negative cervical, supraclavicular or axillary adenopathy.  Respiratory: lungs were clear bilaterally without wheezing or crackles.  Cardiovascular:  Regular rate and rhythm, S1/S2, without murmur, rub or gallop.  There was no pedal edema.  GI:  abdomen was soft, flat, nontender, nondistended, without organomegaly.  Muscoloskeletal:  no spinal tenderness of palpation of vertebral spine.  Skin exam was without echymosis, petichae.  Neuro exam was nonfocal.  Patient was able to get on and  off exam table with minimal assistance.  Gait was slow using a walker.  Patient was alerted and oriented.  Attention was good.   Language was appropriate.  Mood was normal without depression.  Speech was not pressured.  Thought content was not tangential.     LABORATORY/RADIOLOGY DATA:  Lab Results  Component Value Date   WBC 9.9 08/11/2012   HGB 11.1* 08/11/2012   HCT 33.7* 08/11/2012   PLT 243 08/11/2012   GLUCOSE 102* 08/11/2012   CHOL 255* 03/03/2012   TRIG 117 03/03/2012   HDL 62 03/03/2012   LDLDIRECT 166* 04/24/2011   LDLCALC 170* 03/03/2012   ALKPHOS 54 08/11/2012   ALT 28 08/11/2012   AST 17 08/11/2012   NA 137 08/11/2012   K 3.5 08/11/2012   CL 103 08/11/2012   CREATININE 1.3* 08/11/2012   BUN 30.0* 08/11/2012   CO2 25 08/11/2012   INR 7.5* 08/08/2012   HGBA1C 6.6* 05/03/2012     ASSESSMENT AND PLAN:   1. Hypertension: She is on diltiazem per cardiology.  2. Hyperlipidemia: She is on rosuvastatin per PCP.  3. Dconditioning due to bone pain: she is now home from SNF.  She is much more ambulatory compared to before.  4. Lytic bone lesions causing bone pain: much improved with radiation. I will continue with monthly Zometa.  5. Normocytic anemia: work up in the recent past did not show iron/VitB12 deficiency or hypothyroidism. Her anemia is due to myeloma and chemo. There is  no active bleeding. There is no indication for transfusion.  6. Acute subdural hematoma from recent fall during hospital stay in July 2013: Resolved. She is finished with Prednisone taper.  7. Nausea: improved. She has Compazine and Zofran prn.  8. Afib:  Rate control with Diltiazem.  She is on Coumadin per PCP.  8. Moderate calorie/protein malnutrition: From myeloma, and its treatment. This is also improved.    9. Multiple myeloma: S/p 5 cycles of chemo with every good partial response with M-spike decreased from 4 gm/dL and now undetectable. She does not have dose limiting toxicity.  She only has grade 1 fatigue, grade 1 anemia, grade 1 cramp. Evaluated by [facility] B[facility] and not a candidate for transplant at this time. Plan is to continue with induction chemo for about 9 months total and then start maintenance chemo with just Revlimid.   For now, we will continue with current regimen:  - Velcade 1.3mg /m2 SQ once a week (3 weeks on, 1 week off) and oral Revlmid 15mg  PO days 1-21 (with 7 days off) She is to continue with oral Dexamethasone 40mg  PO once a week.  - Continue Acyclovir 200 mg, by mouth, once daily, everyday (to prevent viral infection).  - Continue Bactrim double strength (DS), every Monday, Wednesday, Friday (to prevent bacterial infection).  - She is on Coumadin for afib per PCP.   10. Follow up: in one month.      The length of time of the face-to-face encounter was 25  minutes. More than 50% of time was spent counseling and coordination of care.

## 2012-08-11 NOTE — Telephone Encounter (Signed)
gv pt husband appt schedule for November 2013 thru January 2014. Per HH no lb needed on off chemo wk. Chemo 3wks on 1wk off.

## 2012-08-11 NOTE — Patient Instructions (Addendum)
1. Multiple myeloma.  2. Treatment: Velcade subcutaneous injection once a week; 3 weeks on, 1 week of. Day 1-21 oral Revlimid (then 7 days off). Dexamethasone 40mg  by mouth once a week.  3. Preventative medications:  * Acyclovir 400mg  by mouth once a day (to prevent viral infection)  * Bactrim DS, one tablet every Mon/Wed/Friday to prevent bacterial infection.

## 2012-08-12 ENCOUNTER — Ambulatory Visit (INDEPENDENT_AMBULATORY_CARE_PROVIDER_SITE_OTHER): Payer: Medicare Other | Admitting: Family Medicine

## 2012-08-12 ENCOUNTER — Ambulatory Visit: Payer: Self-pay | Admitting: Family Medicine

## 2012-08-12 ENCOUNTER — Encounter: Payer: Self-pay | Admitting: Family Medicine

## 2012-08-12 VITALS — BP 104/75 | HR 88 | Ht 63.0 in | Wt 164.0 lb

## 2012-08-12 DIAGNOSIS — Z79899 Other long term (current) drug therapy: Secondary | ICD-10-CM

## 2012-08-12 DIAGNOSIS — F329 Major depressive disorder, single episode, unspecified: Secondary | ICD-10-CM

## 2012-08-12 DIAGNOSIS — I749 Embolism and thrombosis of unspecified artery: Secondary | ICD-10-CM

## 2012-08-12 DIAGNOSIS — G4700 Insomnia, unspecified: Secondary | ICD-10-CM

## 2012-08-12 DIAGNOSIS — I10 Essential (primary) hypertension: Secondary | ICD-10-CM

## 2012-08-12 LAB — POCT INR: INR: 3.6

## 2012-08-12 MED ORDER — ACYCLOVIR 400 MG PO TABS: 400.0000 mg | ORAL_TABLET | Freq: Every day | ORAL | Status: DC

## 2012-08-12 MED ORDER — POTASSIUM CHLORIDE CRYS ER 20 MEQ PO TBCR
20.0000 meq | EXTENDED_RELEASE_TABLET | Freq: Every day | ORAL | Status: DC
Start: 2012-08-12 — End: 2012-08-15

## 2012-08-12 NOTE — Patient Instructions (Addendum)
Please keep an eye on your blood pressure. If it is mostly running under 110 then please call me so that we can adjust her Tribenzor.

## 2012-08-12 NOTE — Progress Notes (Signed)
  Subjective:    Patient ID: [name], female    DOB: [birth year], 76 y.o.   MRN: [id number]  HPI Here for followup hypertension. Chest pain or shortness of breath. She's taking her carpenter regularly. We discontinued the verapamil the last office visit as she was actually taking 2 calcium channel blockers. I'm not sure how this happened before he moved the verapamil. She's been doing well and has not had any dizziness spells since then.  Depression-overall she's doing well. She still undergoing chemotherapy for multiple myeloma. Her mood has been fair. She says she does have her ups and downs. Right now she sleeping well as long as she takes a Xanax before bedtime.  Polypharmacy-she also brought in her medications so he could recall them and make sure that they are appropriate and make sure she's not taking duplicates and have a better understanding of what is as needed and what it should be taken on a daily basis.   Review of Systems     Objective:   Physical Exam  Constitutional: She is oriented to person, place, and time. She appears well-developed and well-nourished.  HENT:  Head: Normocephalic and atraumatic.  Cardiovascular: Normal rate, regular rhythm and normal heart sounds.   Pulmonary/Chest: Effort normal and breath sounds normal.  Musculoskeletal:       A lot of bruising on her forearms.   Neurological: She is alert and oriented to person, place, and time.  Skin: Skin is warm and dry.  Psychiatric: She has a normal mood and affect. Her behavior is normal.          Assessment & Plan:  Hypertension-well-controlled. We will keep an eye on it and make sure that she's not going too low.  Depression-PHQ 9 score of 2. Overall I think she is well controlled considering the circumstances. She still going to church which I think is a very positive thing for her. Her husband is here with her today and he is very supportive.  Insomnia-can continue the Xanax for now since it  does work well for her.  The bruising-we will recheck her INR today. I suspect that her INR may be too high. We will adjust her Coumadin based on the results.  Polypharmacy-I. with her all of her medications. We clarified some things. We removed some. I marked her bottles that she was going to discontinue. She actually had to for prescriptions for calcium and been taking both so we combine these together. Thirdly this will be a little smoother. I printed out a new medication list for her today.

## 2012-08-15 ENCOUNTER — Other Ambulatory Visit: Payer: Self-pay | Admitting: Family Medicine

## 2012-08-15 LAB — KAPPA/LAMBDA LIGHT CHAINS: Kappa free light chain: 0.72 mg/dL (ref 0.33–1.94)

## 2012-08-15 LAB — PROTEIN ELECTROPHORESIS, SERUM
Albumin ELP: 58.3 % (ref 55.8–66.1)
Alpha-1-Globulin: 6.2 % — ABNORMAL HIGH (ref 2.9–4.9)
Beta 2: 4.3 % (ref 3.2–6.5)
Beta Globulin: 8 % — ABNORMAL HIGH (ref 4.7–7.2)

## 2012-08-16 ENCOUNTER — Ambulatory Visit (INDEPENDENT_AMBULATORY_CARE_PROVIDER_SITE_OTHER): Payer: Medicare Other | Admitting: Family Medicine

## 2012-08-16 DIAGNOSIS — I749 Embolism and thrombosis of unspecified artery: Secondary | ICD-10-CM

## 2012-08-18 ENCOUNTER — Other Ambulatory Visit (HOSPITAL_BASED_OUTPATIENT_CLINIC_OR_DEPARTMENT_OTHER): Payer: Medicare Other | Admitting: Lab

## 2012-08-18 ENCOUNTER — Ambulatory Visit (HOSPITAL_BASED_OUTPATIENT_CLINIC_OR_DEPARTMENT_OTHER): Payer: Medicare Other

## 2012-08-18 VITALS — BP 116/73 | HR 89 | Temp 97.8°F | Resp 20

## 2012-08-18 DIAGNOSIS — C9000 Multiple myeloma not having achieved remission: Secondary | ICD-10-CM

## 2012-08-18 DIAGNOSIS — Z5112 Encounter for antineoplastic immunotherapy: Secondary | ICD-10-CM

## 2012-08-18 LAB — CBC WITH DIFFERENTIAL/PLATELET
Basophils Absolute: 0 10*3/uL (ref 0.0–0.1)
EOS%: 2.7 % (ref 0.0–7.0)
Eosinophils Absolute: 0.3 10*3/uL (ref 0.0–0.5)
LYMPH%: 19.1 % (ref 14.0–49.7)
MCH: 30.4 pg (ref 25.1–34.0)
MCV: 92.8 fL (ref 79.5–101.0)
MONO%: 13.8 % (ref 0.0–14.0)
NEUT#: 6.5 10*3/uL (ref 1.5–6.5)
Platelets: 167 10*3/uL (ref 145–400)
RBC: 3.35 10*6/uL — ABNORMAL LOW (ref 3.70–5.45)
RDW: 16.2 % — ABNORMAL HIGH (ref 11.2–14.5)
nRBC: 0 % (ref 0–0)

## 2012-08-18 MED ORDER — BORTEZOMIB CHEMO SQ INJECTION 3.5 MG (2.5MG/ML)
1.3000 mg/m2 | Freq: Once | INTRAMUSCULAR | Status: AC
  Administered 2012-08-18: 2.5 mg via SUBCUTANEOUS
  Filled 2012-08-18: qty 2.5

## 2012-08-18 MED ORDER — ONDANSETRON HCL 8 MG PO TABS
8.0000 mg | ORAL_TABLET | Freq: Once | ORAL | Status: AC
  Administered 2012-08-18: 8 mg via ORAL

## 2012-08-18 NOTE — Patient Instructions (Addendum)
[facility] Discharge Instructions for Patients Receiving Chemotherapy  Today you received the following chemotherapy agents velcade  To help prevent nausea and vomiting after your treatment, we encourage you to take your nausea medication and take it as often as prescribed   If you develop nausea and vomiting that is not controlled by your nausea medication, call the clinic. If it is after clinic hours your family physician or the after hours number for the clinic or go to the Emergency Department.   BELOW ARE SYMPTOMS THAT SHOULD BE REPORTED IMMEDIATELY:  *FEVER GREATER THAN 100.5 F  *CHILLS WITH OR WITHOUT FEVER  NAUSEA AND VOMITING THAT IS NOT CONTROLLED WITH YOUR NAUSEA MEDICATION  *UNUSUAL SHORTNESS OF BREATH  *UNUSUAL BRUISING OR BLEEDING  TENDERNESS IN MOUTH AND THROAT WITH OR WITHOUT PRESENCE OF ULCERS  *URINARY PROBLEMS  *BOWEL PROBLEMS  UNUSUAL RASH Items with * indicate a potential emergency and should be followed up as soon as possible.  One of the nurses will contact you 24 hours after your treatment. Please let the nurse know about any problems that you may have experienced. Feel free to call the clinic you have any questions or concerns. The clinic phone number is [phone].   I have been informed and understand all the instructions given to me. I know to contact the clinic, my physician, or go to the Emergency Department if any problems should occur. I do not have any questions at this time, but understand that I may call the clinic during office hours   should I have any questions or need assistance in obtaining follow up care.    __________________________________________  _____________  __________ Signature of Patient or Authorized Representative            Date                   Time    __________________________________________ Nurse's Signature    

## 2012-08-22 ENCOUNTER — Telehealth: Payer: Self-pay | Admitting: *Deleted

## 2012-08-22 ENCOUNTER — Ambulatory Visit: Payer: Self-pay | Admitting: Family Medicine

## 2012-08-22 ENCOUNTER — Ambulatory Visit: Payer: Medicare Other

## 2012-08-22 ENCOUNTER — Other Ambulatory Visit: Payer: Self-pay | Admitting: Oncology

## 2012-08-22 DIAGNOSIS — I749 Embolism and thrombosis of unspecified artery: Secondary | ICD-10-CM

## 2012-08-22 LAB — PROTIME-INR: INR: 2.3 — AB (ref 0.9–1.1)

## 2012-08-22 NOTE — Telephone Encounter (Signed)
Clydie Braun nurse w/ [facility] notified and Eber Jones notified of instructions as well. KG LPN

## 2012-08-22 NOTE — Telephone Encounter (Signed)
Nurse with [facility] calls with INR results. PT= 27.8  INR= 2.3

## 2012-08-22 NOTE — Telephone Encounter (Signed)
See anticoag flowsheet

## 2012-08-26 ENCOUNTER — Ambulatory Visit (HOSPITAL_BASED_OUTPATIENT_CLINIC_OR_DEPARTMENT_OTHER): Payer: Medicare Other

## 2012-08-26 ENCOUNTER — Other Ambulatory Visit (HOSPITAL_BASED_OUTPATIENT_CLINIC_OR_DEPARTMENT_OTHER): Payer: Medicare Other | Admitting: Lab

## 2012-08-26 VITALS — BP 110/72 | HR 82 | Temp 97.8°F

## 2012-08-26 DIAGNOSIS — Z5112 Encounter for antineoplastic immunotherapy: Secondary | ICD-10-CM

## 2012-08-26 DIAGNOSIS — C9000 Multiple myeloma not having achieved remission: Secondary | ICD-10-CM

## 2012-08-26 LAB — CBC WITH DIFFERENTIAL/PLATELET
BASO%: 0.4 % (ref 0.0–2.0)
Eosinophils Absolute: 0.8 10*3/uL — ABNORMAL HIGH (ref 0.0–0.5)
MCHC: 31.7 g/dL (ref 31.5–36.0)
MONO#: 2 10*3/uL — ABNORMAL HIGH (ref 0.1–0.9)
NEUT#: 4.4 10*3/uL (ref 1.5–6.5)
Platelets: 134 10*3/uL — ABNORMAL LOW (ref 145–400)
RBC: 3.31 10*6/uL — ABNORMAL LOW (ref 3.70–5.45)
RDW: 16.3 % — ABNORMAL HIGH (ref 11.2–14.5)
WBC: 9.4 10*3/uL (ref 3.9–10.3)
lymph#: 2.2 10*3/uL (ref 0.9–3.3)
nRBC: 0 % (ref 0–0)

## 2012-08-26 MED ORDER — ONDANSETRON HCL 8 MG PO TABS
8.0000 mg | ORAL_TABLET | Freq: Once | ORAL | Status: AC
  Administered 2012-08-26: 8 mg via ORAL

## 2012-08-26 MED ORDER — BORTEZOMIB CHEMO SQ INJECTION 3.5 MG (2.5MG/ML)
1.3000 mg/m2 | Freq: Once | INTRAMUSCULAR | Status: AC
  Administered 2012-08-26: 2.5 mg via SUBCUTANEOUS
  Filled 2012-08-26: qty 2.5

## 2012-08-29 ENCOUNTER — Telehealth: Payer: Self-pay | Admitting: *Deleted

## 2012-08-29 ENCOUNTER — Ambulatory Visit: Payer: Self-pay | Admitting: Family Medicine

## 2012-08-29 DIAGNOSIS — I749 Embolism and thrombosis of unspecified artery: Secondary | ICD-10-CM

## 2012-08-29 LAB — PROTIME-INR: INR: 2.5 — AB (ref 0.9–1.1)

## 2012-08-29 NOTE — Telephone Encounter (Signed)
Have her cut the tribenzor in half, or I can send new rx for lower dose.  See anticoag flowshee.

## 2012-08-29 NOTE — Telephone Encounter (Signed)
Pt notified of MD instructions. Also notified to continue same dose of Coumadin 1/2 tab daily and rechek 3 weeks.

## 2012-08-29 NOTE — Telephone Encounter (Signed)
Katie nurse notified of instructions as well.

## 2012-08-29 NOTE — Progress Notes (Signed)
  Subjective:    Patient ID: [name], female    DOB: 01-24-1936, 75 y.o.   MRN: [id number]  HPI    Review of Systems     Objective:   Physical Exam        Assessment & Plan:  Pt notified of MD instructions.

## 2012-08-29 NOTE — Telephone Encounter (Signed)
Nurse with [facility] calls with pt inr results. PT= 30.2  INR= 2.5.  Nurse states pt is c/o feeling a little weak this morning and dizziness upon standing. BP was 90/60 this morning

## 2012-09-01 ENCOUNTER — Telehealth: Payer: Self-pay | Admitting: *Deleted

## 2012-09-01 ENCOUNTER — Encounter (HOSPITAL_BASED_OUTPATIENT_CLINIC_OR_DEPARTMENT_OTHER): Payer: Medicare Other | Attending: General Surgery

## 2012-09-01 ENCOUNTER — Other Ambulatory Visit: Payer: Self-pay | Admitting: *Deleted

## 2012-09-01 DIAGNOSIS — C9000 Multiple myeloma not having achieved remission: Secondary | ICD-10-CM

## 2012-09-01 DIAGNOSIS — L8993 Pressure ulcer of unspecified site, stage 3: Secondary | ICD-10-CM | POA: Insufficient documentation

## 2012-09-01 DIAGNOSIS — L89109 Pressure ulcer of unspecified part of back, unspecified stage: Secondary | ICD-10-CM | POA: Insufficient documentation

## 2012-09-01 MED ORDER — LENALIDOMIDE 15 MG PO CAPS: 15.0000 mg | ORAL_CAPSULE | Freq: Every day | ORAL | Status: DC

## 2012-09-01 NOTE — Telephone Encounter (Signed)
Refill request for revlimid received.  Request to MD for review.

## 2012-09-01 NOTE — Telephone Encounter (Signed)
[facility] faxed confirmation of facsimile receipt for revlimid prescription.  Will verify insurance coverage and make delivery arrangements with patient.

## 2012-09-05 ENCOUNTER — Telehealth: Payer: Self-pay | Admitting: *Deleted

## 2012-09-05 DIAGNOSIS — C9000 Multiple myeloma not having achieved remission: Secondary | ICD-10-CM

## 2012-09-05 MED ORDER — FENTANYL 25 MCG/HR TD PT72: 1.0000 | MEDICATED_PATCH | TRANSDERMAL | Status: DC

## 2012-09-05 NOTE — Telephone Encounter (Signed)
Rx up front

## 2012-09-05 NOTE — Telephone Encounter (Signed)
Patient's husband will be by at 2:30 for pick up.

## 2012-09-05 NOTE — Telephone Encounter (Signed)
Ok signed and put in rx box.

## 2012-09-07 ENCOUNTER — Other Ambulatory Visit: Payer: Self-pay | Admitting: Family Medicine

## 2012-09-07 DIAGNOSIS — F419 Anxiety disorder, unspecified: Secondary | ICD-10-CM | POA: Insufficient documentation

## 2012-09-07 DIAGNOSIS — IMO0002 Reserved for concepts with insufficient information to code with codable children: Secondary | ICD-10-CM

## 2012-09-07 DIAGNOSIS — D638 Anemia in other chronic diseases classified elsewhere: Secondary | ICD-10-CM | POA: Insufficient documentation

## 2012-09-07 DIAGNOSIS — M899 Disorder of bone, unspecified: Secondary | ICD-10-CM | POA: Insufficient documentation

## 2012-09-07 DIAGNOSIS — Z8679 Personal history of other diseases of the circulatory system: Secondary | ICD-10-CM | POA: Insufficient documentation

## 2012-09-07 DIAGNOSIS — Z9221 Personal history of antineoplastic chemotherapy: Secondary | ICD-10-CM | POA: Insufficient documentation

## 2012-09-07 DIAGNOSIS — F329 Major depressive disorder, single episode, unspecified: Secondary | ICD-10-CM | POA: Insufficient documentation

## 2012-09-07 DIAGNOSIS — N179 Acute kidney failure, unspecified: Secondary | ICD-10-CM | POA: Insufficient documentation

## 2012-09-07 DIAGNOSIS — IMO0001 Reserved for inherently not codable concepts without codable children: Secondary | ICD-10-CM | POA: Insufficient documentation

## 2012-09-07 DIAGNOSIS — I10 Essential (primary) hypertension: Secondary | ICD-10-CM | POA: Insufficient documentation

## 2012-09-07 DIAGNOSIS — M1990 Unspecified osteoarthritis, unspecified site: Secondary | ICD-10-CM | POA: Insufficient documentation

## 2012-09-07 DIAGNOSIS — E785 Hyperlipidemia, unspecified: Secondary | ICD-10-CM | POA: Insufficient documentation

## 2012-09-08 ENCOUNTER — Encounter: Payer: Self-pay | Admitting: Oncology

## 2012-09-08 ENCOUNTER — Other Ambulatory Visit (HOSPITAL_BASED_OUTPATIENT_CLINIC_OR_DEPARTMENT_OTHER): Payer: Medicare Other | Admitting: Lab

## 2012-09-08 ENCOUNTER — Ambulatory Visit (HOSPITAL_BASED_OUTPATIENT_CLINIC_OR_DEPARTMENT_OTHER): Payer: Medicare Other | Admitting: Oncology

## 2012-09-08 ENCOUNTER — Ambulatory Visit: Payer: Medicare Other | Admitting: Radiation Oncology

## 2012-09-08 ENCOUNTER — Telehealth: Payer: Self-pay | Admitting: Oncology

## 2012-09-08 ENCOUNTER — Telehealth: Payer: Self-pay | Admitting: *Deleted

## 2012-09-08 ENCOUNTER — Ambulatory Visit (HOSPITAL_BASED_OUTPATIENT_CLINIC_OR_DEPARTMENT_OTHER): Payer: Medicare Other

## 2012-09-08 VITALS — BP 91/61 | HR 92 | Temp 97.0°F | Resp 18 | Ht 63.0 in | Wt 155.7 lb

## 2012-09-08 DIAGNOSIS — Z780 Asymptomatic menopausal state: Secondary | ICD-10-CM

## 2012-09-08 DIAGNOSIS — C9030 Solitary plasmacytoma not having achieved remission: Secondary | ICD-10-CM

## 2012-09-08 DIAGNOSIS — N179 Acute kidney failure, unspecified: Secondary | ICD-10-CM

## 2012-09-08 DIAGNOSIS — M899 Disorder of bone, unspecified: Secondary | ICD-10-CM

## 2012-09-08 DIAGNOSIS — K5901 Slow transit constipation: Secondary | ICD-10-CM

## 2012-09-08 DIAGNOSIS — M25559 Pain in unspecified hip: Secondary | ICD-10-CM

## 2012-09-08 DIAGNOSIS — E669 Obesity, unspecified: Secondary | ICD-10-CM

## 2012-09-08 DIAGNOSIS — E785 Hyperlipidemia, unspecified: Secondary | ICD-10-CM

## 2012-09-08 DIAGNOSIS — C9000 Multiple myeloma not having achieved remission: Secondary | ICD-10-CM

## 2012-09-08 DIAGNOSIS — F329 Major depressive disorder, single episode, unspecified: Secondary | ICD-10-CM

## 2012-09-08 DIAGNOSIS — N289 Disorder of kidney and ureter, unspecified: Secondary | ICD-10-CM

## 2012-09-08 DIAGNOSIS — E860 Dehydration: Secondary | ICD-10-CM

## 2012-09-08 DIAGNOSIS — N63 Unspecified lump in unspecified breast: Secondary | ICD-10-CM

## 2012-09-08 DIAGNOSIS — D649 Anemia, unspecified: Secondary | ICD-10-CM

## 2012-09-08 DIAGNOSIS — R7989 Other specified abnormal findings of blood chemistry: Secondary | ICD-10-CM

## 2012-09-08 DIAGNOSIS — S065X9A Traumatic subdural hemorrhage with loss of consciousness of unspecified duration, initial encounter: Secondary | ICD-10-CM

## 2012-09-08 DIAGNOSIS — M545 Low back pain: Secondary | ICD-10-CM

## 2012-09-08 DIAGNOSIS — F411 Generalized anxiety disorder: Secondary | ICD-10-CM

## 2012-09-08 DIAGNOSIS — I10 Essential (primary) hypertension: Secondary | ICD-10-CM

## 2012-09-08 DIAGNOSIS — Z5112 Encounter for antineoplastic immunotherapy: Secondary | ICD-10-CM

## 2012-09-08 DIAGNOSIS — G4700 Insomnia, unspecified: Secondary | ICD-10-CM

## 2012-09-08 LAB — CBC WITH DIFFERENTIAL/PLATELET
Basophils Absolute: 0 10*3/uL (ref 0.0–0.1)
Eosinophils Absolute: 0 10*3/uL (ref 0.0–0.5)
HGB: 11.3 g/dL — ABNORMAL LOW (ref 11.6–15.9)
NEUT#: 8 10*3/uL — ABNORMAL HIGH (ref 1.5–6.5)
RBC: 3.62 10*6/uL — ABNORMAL LOW (ref 3.70–5.45)
RDW: 16.4 % — ABNORMAL HIGH (ref 11.2–14.5)
WBC: 11.9 10*3/uL — ABNORMAL HIGH (ref 3.9–10.3)
lymph#: 2.4 10*3/uL (ref 0.9–3.3)
nRBC: 0 % (ref 0–0)

## 2012-09-08 LAB — COMPREHENSIVE METABOLIC PANEL (CC13)
ALT: 16 U/L (ref 0–55)
Albumin: 3.6 g/dL (ref 3.5–5.0)
Alkaline Phosphatase: 47 U/L (ref 40–150)
CO2: 26 mEq/L (ref 22–29)
Glucose: 112 mg/dl — ABNORMAL HIGH (ref 70–99)
Potassium: 3.8 mEq/L (ref 3.5–5.1)
Sodium: 139 mEq/L (ref 136–145)
Total Bilirubin: 0.38 mg/dL (ref 0.20–1.20)
Total Protein: 6.3 g/dL — ABNORMAL LOW (ref 6.4–8.3)

## 2012-09-08 MED ORDER — ZOLEDRONIC ACID 4 MG/100ML IV SOLN
4.0000 mg | Freq: Once | INTRAVENOUS | Status: AC
  Administered 2012-09-08: 4 mg via INTRAVENOUS
  Filled 2012-09-08: qty 100

## 2012-09-08 MED ORDER — BORTEZOMIB CHEMO SQ INJECTION 3.5 MG (2.5MG/ML)
1.3000 mg/m2 | Freq: Once | INTRAMUSCULAR | Status: AC
  Administered 2012-09-08: 2.5 mg via SUBCUTANEOUS
  Filled 2012-09-08: qty 2.5

## 2012-09-08 MED ORDER — ONDANSETRON HCL 8 MG PO TABS
8.0000 mg | ORAL_TABLET | Freq: Once | ORAL | Status: AC
  Administered 2012-09-08: 8 mg via ORAL

## 2012-09-08 NOTE — Telephone Encounter (Signed)
Message copied by Wende Mott on Thu Sep 08, 2012  4:43 PM ------      Message from: Clenton Pare R      Created: Thu Sep 08, 2012  4:24 PM       Please call patient. CMET ha returned showing that her kidney function has decreased. Suspect due to medication and dehydration. Recommend that she stop her Tribenzor as this medication can affect the kidneys. Encourage her to drink 1.5-2 liters of fluid daily. Will recheck kidney function next week when she comes in.

## 2012-09-08 NOTE — Telephone Encounter (Signed)
Left VM for husband to please return nurse's call regarding lab work (need to relay message from [employer] below).

## 2012-09-08 NOTE — Progress Notes (Signed)
[facility]  [phone] [phone]   OFFICE PROGRESS NOTE   Cc:  METHENEY,CATHERINE, MD  DIAGNOSIS: IgA kappa multiple myeloma. She presented with diffuse lytic bone lesions, renal insufficiency, hypercalcemia, anemia. Her M-spike was 4.06 gm/dL. Her IgA was elevated at 4,020 mg/dL; IgG 353; IgM 5; serum kappa was 2.11; lambda 0.60; serum kappa to lambda ratio elevated at 3.06. Spot urine showed elevated kappa:lambda at 579; and positive for Bence Jones protein. Bone marrow biopsy on 04/08/2012 showed 92% plasma cell. Cytogenetics showed multiple chromosomal abnormalities; additional chromosomal material on chromosome 8p; 14q; loss of chromosome 8 and 13. 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   CURRENT THERAPY: Started on SQ Velcade 1.3mg /m2 on 04/12/2012; weekly; 3 weeks on; 1 week off. She started on Revlimid 15mg  PO daily d1-21 on 04/21/2012. She is also on Dexamethasone 40mg  PO once weekly. She also received palliative radiation 20 Gy in 8 fractions to the bilateral hips.    INTERVAL HISTORY: [name] 76 y.o. female returns for regular follow up with her husband. She is getting stronger. No back pain. Appetite is decreased and she is losing weight. She is able to ambulate around the house everyday by herself using a walker.  She also leaves the house several times a week such as going to get her hair done. States she has a home health nurse visiting 3 times per week. BP has been low at home. She is asymptomatic without dizziness or lightheadedness.   Patient denies fever, fatigue, headache, visual changes, confusion, drenching night sweats, palpable lymph node swelling, mucositis, odynophagia, dysphagia, nausea vomiting, jaundice, chest pain, palpitation, shortness of breath, dyspnea on exertion, productive cough, gum bleeding, epistaxis, hematemesis, hemoptysis, abdominal pain, abdominal swelling, early satiety, melena, hematochezia,  hematuria, skin rash, spontaneous bleeding, heat or cold intolerance, bowel bladder incontinence, focal motor weakness, paresthesia, depression, suicidal or homicidal ideation, feeling hopelessness.   Past Medical History  Diagnosis Date  . Anxiety   . Hypertension   . Hyperlipidemia   . Depression   . Osteopenia   . Multiple myeloma 03/2012  . Acute renal failure      due to hypercalcemia and suspected multiple myeloma resolved =with IVF's    . Lytic bone lesions on xray   . Acute subdural hematoma 03/2012     recent fall in hospital stay,resolved+  . Normocytic anemia     due to chemo and multiple myeloma,no active bleding  . Cancer     mul;tiple myeloma, bone lesions r 7th rib,   . Arthritis     spine  . Atrial fibrillation   . Radiation 05/09/12-05/18/12    20 gray in 8 Fx's right hip palliative  . Status post chemotherapy     velcade and revlimid    Past Surgical History  Procedure Date  . Breast lumpectomy   . 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 Tonsillectomy     age 60    Current Outpatient Prescriptions  Medication Sig Dispense Refill  . acetaminophen (TYLENOL) 500 MG tablet Take 1-2 tablets (500-1,000 mg total) by mouth every 4 (four) hours as needed. No more than 6 tabs in one day.  50 tablet  2  . acyclovir (ZOVIRAX) 400 MG tablet TAKE 1 TABLET DAILY.  30 tablet  2  . ALPRAZolam (XANAX) 1 MG tablet TAKE 1 TABLET DAILY AS NEEDED FOR SLEEP  60 tablet  0  . calcium-vitamin D (OSCAL WITH D) 500-200 MG-UNIT per tablet Take 1 tablet by mouth 2 (two) times daily.      . CRESTOR 10 MG tablet TAKE 1 TABLET AT BEDTIME.  30 tablet  3  . cyclobenzaprine (FLEXERIL) 5 MG tablet Take 5 mg by mouth 3 (three) times daily as needed. Muscle pain      . dexamethasone (DECADRON) 4 MG tablet Take 40 mg by mouth every Tuesday. with chemotherapy drug      . fentaNYL (DURAGESIC - DOSED  MCG/HR) 25 MCG/HR Place 1 patch (25 mcg total) onto the skin every 3 (three) days. As directed  10 patch  0  . HYDROcodone-acetaminophen (VICODIN) 5-500 MG per tablet Take 1 tablet by mouth every 6 (six) hours as needed for pain. pain  10 tablet  0  . lenalidomide (REVLIMID) 15 MG capsule Take 1 capsule (15 mg total) by mouth daily.  15 capsule  0  . ondansetron (ZOFRAN) 4 MG tablet Take 4 mg by mouth. Take 1 tablet every 8 hours as needed for nausea      . potassium chloride SA (K-DUR,KLOR-CON) 20 MEQ tablet TAKE 1 TABLET DAILY.  30 tablet  2  . prochlorperazine (COMPAZINE) 10 MG tablet Take 10 mg by mouth. Take 1 tablet every 6 hours as directed      . sulfamethoxazole-trimethoprim (BACTRIM DS) 800-160 MG per tablet Take 1 tablet by mouth 3 (three) times a week.      Marya Landry 40-10-12.5 MG TABS TAKE 1 TABLET DAILY  30 tablet  2  . warfarin (COUMADIN) 4 MG tablet Take 4 mg by mouth daily. Coumadin 2 mg daily        ALLERGIES:  is allergic to simvastatin.  REVIEW OF SYSTEMS:  The rest of the 14-point review of system was negative.   Filed Vitals:   09/08/12 1323  BP: 91/61  Pulse: 92  Temp: 97 F (36.1 C)  Resp: 18   Wt Readings from Last 3 Encounters:  09/08/12 155 lb 11.2 oz (70.625 kg)  08/12/12 164 lb (74.39 kg)  08/11/12 165 lb 6.4 oz (75.025 kg)   ECOG Performance status: 1  PHYSICAL EXAMINATION:   General:  well-nourished woman, in no acute distress.  Eyes:  no scleral icterus.  ENT:  There were no oropharyngeal lesions.  Neck was without thyromegaly.  Lymphatics:  Negative cervical, supraclavicular or axillary adenopathy.  Respiratory: lungs were clear bilaterally without wheezing or crackles.  Cardiovascular:  Regular rate and rhythm, S1/S2, without murmur, rub or gallop.  There was no pedal edema.  GI:  abdomen was soft, flat, nontender, nondistended, without organomegaly.  Muscoloskeletal:  no spinal tenderness of palpation of vertebral spine.  Skin exam was without  echymosis, petichae.  Neuro exam was nonfocal.  Patient was able to get on and off exam table with minimal assistance.  Gait was slow using a walker.  Patient was alerted and oriented.  Attention was good.   Language was appropriate.  Mood was normal without depression.  Speech was not pressured.  Thought content was not tangential.     LABORATORY/RADIOLOGY DATA:  Lab Results  Component Value Date   WBC 11.9* 09/08/2012   HGB 11.3* 09/08/2012   HCT 33.5* 09/08/2012   PLT 311 09/08/2012   GLUCOSE 112* 09/08/2012   CHOL 255* 03/03/2012   TRIG 117 03/03/2012   HDL 62 03/03/2012   LDLDIRECT 166* 04/24/2011   LDLCALC 170* 03/03/2012   ALKPHOS 47 09/08/2012  ALT 16 09/08/2012   AST 14 09/08/2012   NA 139 09/08/2012   K 3.8 09/08/2012   CL 101 09/08/2012   CREATININE 2.8* 09/08/2012   BUN 51.0* 09/08/2012   CO2 26 09/08/2012   INR 2.5* 08/29/2012   HGBA1C 6.6* 05/03/2012     ASSESSMENT AND PLAN:   1. Hypertension: She is on Tribenzor per cardiology.  2. Hyperlipidemia: She is on rosuvastatin per PCP.  3. Dconditioning due to bone pain: she is now home from SNF.  She is much more ambulatory compared to before.  4. Lytic bone lesions causing bone pain: much improved with radiation. I will continue with monthly Zometa.  5. Normocytic anemia: work up in the recent past did not show iron/VitB12 deficiency or hypothyroidism. Her anemia is due to myeloma and chemo. There is no active bleeding. There is no indication for transfusion.  6. Acute subdural hematoma from recent fall during hospital stay in July 2013: Resolved. She is finished with Prednisone taper.  7. Nausea: improved. She has Compazine and Zofran prn.  8. Afib:  She is on Coumadin per PCP.  8. Moderate calorie/protein malnutrition: From myeloma, and its treatment. Appetite is decreased. I have recommended that she drink 2-3 cans of Ensure per day. Referral to Dietician placed.  9. Multiple myeloma: S/p 6 cycles of chemo with every good  partial response with M-spike decreased from 4 gm/dL and now undetectable. She does not have dose limiting toxicity.  She only has grade 1 fatigue, grade 1 anemia, grade 1 cramp. Evaluated by [facility] and not a candidate for transplant at this time. Plan is to continue with induction chemo for about 9 months total and then start maintenance chemo with just Revlimid.   For now, we will continue with current regimen:  - Velcade 1.3mg /m2 SQ once a week (3 weeks on, 1 week off) and oral Revlmid 15mg  PO days 1-21 (with 7 days off) She is to continue with oral Dexamethasone 40mg  PO once a week.  - Continue Acyclovir 200 mg, by mouth, once daily, everyday (to prevent viral infection).  - Continue Bactrim double strength (DS), every Monday, Wednesday, Friday (to prevent bacterial infection).  - She is on Coumadin for afib per PCP.   10. Renal insufficiency: BUN/Cr are elevated today. Etiology is unclear. Suspect due to a component of dehydration. I have asked her to hold her Tribenzor as this medication has a diuretic and ARB in it. Push fluids. Will recheck BUN/Cr next week and if not improving, may need further work-up.  11. Follow up: in one month.      The length of time of the face-to-face encounter was 15  minutes. More than 50% of time was spent counseling and coordination of care.

## 2012-09-08 NOTE — Telephone Encounter (Signed)
Schedule pt for nut appt for 12.27.13...the patient aware...gv and printed appt schedule for Dec and Jan 2014

## 2012-09-08 NOTE — Patient Instructions (Addendum)
[facility] Discharge Instructions for Patients Receiving Chemotherapy  Today you received the following chemotherapy agents Velcade/zometa  To help prevent nausea and vomiting after your treatment, we encourage you to take your nausea medication as directed.   If you develop nausea and vomiting that is not controlled by your nausea medication, call the clinic. If it is after clinic hours your family physician or the after hours number for the clinic or go to the Emergency Department.   BELOW ARE SYMPTOMS THAT SHOULD BE REPORTED IMMEDIATELY:  *FEVER GREATER THAN 100.5 F  *CHILLS WITH OR WITHOUT FEVER  NAUSEA AND VOMITING THAT IS NOT CONTROLLED WITH YOUR NAUSEA MEDICATION  *UNUSUAL SHORTNESS OF BREATH  *UNUSUAL BRUISING OR BLEEDING  TENDERNESS IN MOUTH AND THROAT WITH OR WITHOUT PRESENCE OF ULCERS  *URINARY PROBLEMS  *BOWEL PROBLEMS  UNUSUAL RASH Items with * indicate a potential emergency and should be followed up as soon as possible.  One of the nurses will contact you 24 hours after your treatment. Please let the nurse know about any problems that you may have experienced. Feel free to call the clinic you have any questions or concerns. The clinic phone number is [phone].   I have been informed and understand all the instructions given to me. I know to contact the clinic, my physician, or go to the Emergency Department if any problems should occur. I do not have any questions at this time, but understand that I may call the clinic during office hours   should I have any questions or need assistance in obtaining follow up care.    __________________________________________  _____________  __________ Signature of Patient or Authorized Representative            Date                   Time    __________________________________________ Nurse's Signature

## 2012-09-09 ENCOUNTER — Telehealth: Payer: Self-pay | Admitting: *Deleted

## 2012-09-09 NOTE — Telephone Encounter (Signed)
Husband returned nurse's call regarding labs.  Instructed him on wife's Kidney function decreased and orders from Clenton Pare, NP to hold Tribenzor and push PO fluids 1.5 to 2 Liters per day.  Will recheck labs and BP next week on 12/19 appt.  Husband verbalized understanding and we discussed ways to encourage pt to drink more fluid,  Such as using supplements and having her sip on favorite drinks throughout the day.

## 2012-09-12 LAB — PROTEIN ELECTROPHORESIS, SERUM
Albumin ELP: 59.5 % (ref 55.8–66.1)
Beta 2: 4.7 % (ref 3.2–6.5)
Beta Globulin: 7.1 % (ref 4.7–7.2)
Gamma Globulin: 5.8 % — ABNORMAL LOW (ref 11.1–18.8)

## 2012-09-12 LAB — KAPPA/LAMBDA LIGHT CHAINS: Kappa:Lambda Ratio: 1.58 (ref 0.26–1.65)

## 2012-09-14 ENCOUNTER — Other Ambulatory Visit: Payer: Self-pay | Admitting: Oncology

## 2012-09-14 MED ORDER — DEXAMETHASONE 4 MG PO TABS: 40.0000 mg | ORAL_TABLET | ORAL | Status: DC

## 2012-09-15 ENCOUNTER — Ambulatory Visit (HOSPITAL_BASED_OUTPATIENT_CLINIC_OR_DEPARTMENT_OTHER): Payer: Medicare Other

## 2012-09-15 ENCOUNTER — Ambulatory Visit
Admission: RE | Admit: 2012-09-15 | Discharge: 2012-09-15 | Disposition: A | Discharge status: Home / Self Care | Payer: Medicare Other | Source: Ambulatory Visit | Attending: Radiation Oncology | Admitting: Radiation Oncology

## 2012-09-15 ENCOUNTER — Other Ambulatory Visit (HOSPITAL_BASED_OUTPATIENT_CLINIC_OR_DEPARTMENT_OTHER): Payer: Medicare Other

## 2012-09-15 ENCOUNTER — Encounter: Payer: Self-pay | Admitting: Radiation Oncology

## 2012-09-15 ENCOUNTER — Telehealth: Payer: Self-pay | Admitting: *Deleted

## 2012-09-15 ENCOUNTER — Other Ambulatory Visit: Payer: Self-pay | Admitting: Oncology

## 2012-09-15 VITALS — BP 144/81 | HR 100 | Temp 98.2°F

## 2012-09-15 VITALS — BP 149/100 | HR 97 | Temp 98.1°F | Resp 20 | Wt 155.4 lb

## 2012-09-15 DIAGNOSIS — C9000 Multiple myeloma not having achieved remission: Secondary | ICD-10-CM

## 2012-09-15 DIAGNOSIS — Z5112 Encounter for antineoplastic immunotherapy: Secondary | ICD-10-CM

## 2012-09-15 LAB — CBC WITH DIFFERENTIAL/PLATELET
BASO%: 2.1 % — ABNORMAL HIGH (ref 0.0–2.0)
Eosinophils Absolute: 0.2 10*3/uL (ref 0.0–0.5)
MONO#: 1.1 10*3/uL — ABNORMAL HIGH (ref 0.1–0.9)
NEUT#: 3.8 10*3/uL (ref 1.5–6.5)
Platelets: 140 10*3/uL — ABNORMAL LOW (ref 145–400)
RBC: 3.28 10*6/uL — ABNORMAL LOW (ref 3.70–5.45)
RDW: 17.4 % — ABNORMAL HIGH (ref 11.2–14.5)
WBC: 6.3 10*3/uL (ref 3.9–10.3)
lymph#: 1.1 10*3/uL (ref 0.9–3.3)

## 2012-09-15 LAB — BASIC METABOLIC PANEL (CC13)
CO2: 25 mEq/L (ref 22–29)
Glucose: 90 mg/dl (ref 70–99)
Potassium: 4 mEq/L (ref 3.5–5.1)
Sodium: 141 mEq/L (ref 136–145)

## 2012-09-15 MED ORDER — BORTEZOMIB CHEMO SQ INJECTION 3.5 MG (2.5MG/ML)
1.3000 mg/m2 | Freq: Once | INTRAMUSCULAR | Status: AC
  Administered 2012-09-15: 2.5 mg via SUBCUTANEOUS
  Filled 2012-09-15: qty 2.5

## 2012-09-15 MED ORDER — ONDANSETRON HCL 8 MG PO TABS
8.0000 mg | ORAL_TABLET | Freq: Once | ORAL | Status: AC
  Administered 2012-09-15: 8 mg via ORAL

## 2012-09-15 MED ORDER — HYDROCODONE-ACETAMINOPHEN 5-500 MG PO TABS: 1.0000 | ORAL_TABLET | Freq: Four times a day (QID) | ORAL | Status: DC | PRN

## 2012-09-15 NOTE — Progress Notes (Signed)
Radiation Oncology         [phone] ________________________________  Name: [name] MRN: [id number]  Date: 09/15/2012  DOB: 05/16/1936  Follow-Up Visit Note  CC: [name], MD  Exie Parody, MD  Diagnosis:   Multiple myeloma  Interval Since Last Radiation:  One month   Narrative:  The patient returns today for routine follow-up.  The patient states that she has been improving, getting stronger. She had a nice response to palliative treatment to the right hip with the pain really resolved she states that this time. She is continuing systemic treatment. Her appetite has been fair. She is able to walk with a slow, steady gait with her walker. She states that she can walk around the house some without the walker at this time.                              ALLERGIES:  is allergic to simvastatin.  Meds: Current Outpatient Prescriptions  Medication Sig Dispense Refill  . acetaminophen (TYLENOL) 500 MG tablet Take 1-2 tablets (500-1,000 mg total) by mouth every 4 (four) hours as needed. No more than 6 tabs in one day.  50 tablet  2  . CRESTOR 10 MG tablet TAKE 1 TABLET AT BEDTIME.  30 tablet  3  . dexamethasone (DECADRON) 4 MG tablet Take 10 tablets (40 mg total) by mouth every Tuesday. with chemotherapy drug  40 tablet  4  . fentaNYL (DURAGESIC - DOSED MCG/HR) 25 MCG/HR Place 1 patch (25 mcg total) onto the skin every 3 (three) days. As directed  10 patch  0  . HYDROcodone-acetaminophen (VICODIN) 5-500 MG per tablet Take 1 tablet by mouth every 6 (six) hours as needed for pain. pain  30 tablet  0  . lenalidomide (REVLIMID) 15 MG capsule Take 1 capsule (15 mg total) by mouth daily.  15 capsule  0  . ondansetron (ZOFRAN) 4 MG tablet Take 4 mg by mouth. Take 1 tablet every 8 hours as needed for nausea      . potassium chloride SA (K-DUR,KLOR-CON) 20 MEQ tablet TAKE 1 TABLET DAILY.  30 tablet  2  . prochlorperazine (COMPAZINE) 10 MG tablet Take 10 mg by mouth. Take 1 tablet every  6 hours as directed      . sulfamethoxazole-trimethoprim (BACTRIM DS) 800-160 MG per tablet Take 1 tablet by mouth 3 (three) times a week.      . warfarin (COUMADIN) 4 MG tablet Take 4 mg by mouth daily. Coumadin 2 mg daily      . ALPRAZolam (XANAX) 1 MG tablet TAKE 1 TABLET DAILY AS NEEDED FOR SLEEP  60 tablet  0  . TRIBENZOR 40-10-12.5 MG TABS TAKE 1 TABLET DAILY  30 tablet  2    Physical Findings: The patient is in no acute distress. Patient is alert and oriented.  weight is 155 lb 6.4 oz (70.489 kg). Her oral temperature is 98.1 F (36.7 C). Her blood pressure is 149/100 and her pulse is 97. Her respiration is 20 and oxygen saturation is 97%. .     Lab Findings: Lab Results  Component Value Date   WBC 6.3 09/15/2012   HGB 10.7* 09/15/2012   HCT 31.0* 09/15/2012   MCV 94.5 09/15/2012   PLT 140* 09/15/2012     Radiographic Findings: No results found.  Impression:    The patient had a good palliative response to her prior course of radiotherapy to  the right hip. No new significant areas of pain. She has been slowly getting stronger.  Plan:  She will return to our clinic on a when necessary basis. She is continuing systemic treatment through medical oncology.   Radene Gunning, M.D., Ph.D.

## 2012-09-15 NOTE — Patient Instructions (Addendum)
[facility] Discharge Instructions for Patients Receiving Chemotherapy  Today you received the following chemotherapy agents Velcade.  To help prevent nausea and vomiting after your treatment, we encourage you to take your nausea medication as prescribed.   If you develop nausea and vomiting that is not controlled by your nausea medication, call the clinic. If it is after clinic hours your family physician or the after hours number for the clinic or go to the Emergency Department.   BELOW ARE SYMPTOMS THAT SHOULD BE REPORTED IMMEDIATELY:  *FEVER GREATER THAN 100.5 F  *CHILLS WITH OR WITHOUT FEVER  NAUSEA AND VOMITING THAT IS NOT CONTROLLED WITH YOUR NAUSEA MEDICATION  *UNUSUAL SHORTNESS OF BREATH  *UNUSUAL BRUISING OR BLEEDING  TENDERNESS IN MOUTH AND THROAT WITH OR WITHOUT PRESENCE OF ULCERS  *URINARY PROBLEMS  *BOWEL PROBLEMS  UNUSUAL RASH Items with * indicate a potential emergency and should be followed up as soon as possible.  Feel free to call the clinic you have any questions or concerns. The clinic phone number is [phone].   I have been informed and understand all the instructions given to me. I know to contact the clinic, my physician, or go to the Emergency Department if any problems should occur. I do not have any questions at this time, but understand that I may call the clinic during office hours   should I have any questions or need assistance in obtaining follow up care.    __________________________________________  _____________  __________ Signature of Patient or Authorized Representative            Date                   Time    __________________________________________ Nurse's Signature    

## 2012-09-15 NOTE — Telephone Encounter (Signed)
Called in refill on Hydrocodone to [facility]. Called pt and informed her of refills on dexamethasone and hydrocodone.  She verbalized understanding.

## 2012-09-15 NOTE — Telephone Encounter (Signed)
Message copied by Wende Mott on Thu Sep 15, 2012  8:53 AM ------      Message from: Clenton Pare R      Created: Thu Sep 15, 2012  8:48 AM      Regarding: RE: 'Refills'      Contact: [phone]       Dexamethasone sent to pharmacy on file. Rx for Vicodin printed.      ----- Message -----         From: Marcell Barlow, RN         Sent: 09/14/2012   2:03 PM           To: Myrtis Ser, NP      Subject: 'Refills'                                                Patient needs refills on following medications:            Hydrocodone---last filled 05/24/12            Dexamethasone----last filled 04/20/12                  Let me know if I can help.            Natalie.

## 2012-09-15 NOTE — Progress Notes (Signed)
Patient arrived via slow steady gait with walker,alert oriented x3, follow up multiple myeloma:  rad tx right hip :05/09/12-05/18/12 Off b/p med x 1 week, hr b/p up today x2, denys pain at present, appetite fair, fatigue  A lot states patient, patient was told to drink more water her kidneys not doing so well states husband 2:27 PM

## 2012-09-16 ENCOUNTER — Telehealth: Payer: Self-pay | Admitting: *Deleted

## 2012-09-16 NOTE — Telephone Encounter (Signed)
Yes hold the tribenzor and let recheck BP and labs in one week.

## 2012-09-16 NOTE — Telephone Encounter (Signed)
Spoke w/ pt and relayed Kristin's message that her kidney function much improved since holding the Tribenzor.  Instructed her to call PCP regarding whether or not she wants pt to resume this med or change of medication for blood pressure.  Pt verbalized understanding and will call PCP today.

## 2012-09-16 NOTE — Telephone Encounter (Signed)
Pt notified and will have rechecked at cancer center next week

## 2012-09-16 NOTE — Telephone Encounter (Signed)
[name] called and states that the cancer center states that her labs were off with the kidneys and wanted her to call and see if you wanted to stop the BP med with the diuretic in it- Tribenzor. What do you want the patient to do. BP has been under control until recently. Labs are in the chart. Please advise

## 2012-09-16 NOTE — Telephone Encounter (Signed)
Message copied by Wende Mott on Fri Sep 16, 2012 12:14 PM ------      Message from: Clenton Pare R      Created: Thu Sep 15, 2012  4:20 PM       Call pt/husband. Kidney function is much better. Please have her f/u with PCP regarding further management of her BP medications.

## 2012-09-20 ENCOUNTER — Other Ambulatory Visit: Payer: Self-pay | Admitting: *Deleted

## 2012-09-22 ENCOUNTER — Other Ambulatory Visit (HOSPITAL_BASED_OUTPATIENT_CLINIC_OR_DEPARTMENT_OTHER): Payer: Medicare Other | Admitting: Lab

## 2012-09-22 ENCOUNTER — Ambulatory Visit (HOSPITAL_BASED_OUTPATIENT_CLINIC_OR_DEPARTMENT_OTHER): Payer: Medicare Other

## 2012-09-22 VITALS — BP 157/86 | HR 84 | Temp 97.0°F | Resp 20

## 2012-09-22 DIAGNOSIS — C9000 Multiple myeloma not having achieved remission: Secondary | ICD-10-CM

## 2012-09-22 DIAGNOSIS — Z5112 Encounter for antineoplastic immunotherapy: Secondary | ICD-10-CM

## 2012-09-22 LAB — CBC WITH DIFFERENTIAL/PLATELET
Basophils Absolute: 0 10*3/uL (ref 0.0–0.1)
EOS%: 3.5 % (ref 0.0–7.0)
Eosinophils Absolute: 0.4 10*3/uL (ref 0.0–0.5)
HCT: 29.9 % — ABNORMAL LOW (ref 34.8–46.6)
HGB: 10.1 g/dL — ABNORMAL LOW (ref 11.6–15.9)
MCH: 31.5 pg (ref 25.1–34.0)
MCV: 93.1 fL (ref 79.5–101.0)
MONO%: 11 % (ref 0.0–14.0)
NEUT#: 9.2 10*3/uL — ABNORMAL HIGH (ref 1.5–6.5)
NEUT%: 74.2 % (ref 38.4–76.8)
Platelets: 126 10*3/uL — ABNORMAL LOW (ref 145–400)
RDW: 17.3 % — ABNORMAL HIGH (ref 11.2–14.5)

## 2012-09-22 MED ORDER — ONDANSETRON HCL 8 MG PO TABS
8.0000 mg | ORAL_TABLET | Freq: Once | ORAL | Status: AC
  Administered 2012-09-22: 8 mg via ORAL

## 2012-09-22 MED ORDER — BORTEZOMIB CHEMO SQ INJECTION 3.5 MG (2.5MG/ML)
1.3000 mg/m2 | Freq: Once | INTRAMUSCULAR | Status: AC
  Administered 2012-09-22: 2.5 mg via SUBCUTANEOUS
  Filled 2012-09-22: qty 2.5

## 2012-09-22 NOTE — Patient Instructions (Addendum)
[facility] Discharge Instructions for Patients Receiving Chemotherapy  Today you received the following chemotherapy agents :  Velcade.  To help prevent nausea and vomiting after your treatment, we encourage you to take your nausea medication as instructed by your physician.    If you develop nausea and vomiting that is not controlled by your nausea medication, call the clinic. If it is after clinic hours your family physician or the after hours number for the clinic or go to the Emergency Department.   BELOW ARE SYMPTOMS THAT SHOULD BE REPORTED IMMEDIATELY:  *FEVER GREATER THAN 100.5 F  *CHILLS WITH OR WITHOUT FEVER  NAUSEA AND VOMITING THAT IS NOT CONTROLLED WITH YOUR NAUSEA MEDICATION  *UNUSUAL SHORTNESS OF BREATH  *UNUSUAL BRUISING OR BLEEDING  TENDERNESS IN MOUTH AND THROAT WITH OR WITHOUT PRESENCE OF ULCERS  *URINARY PROBLEMS  *BOWEL PROBLEMS  UNUSUAL RASH Items with * indicate a potential emergency and should be followed up as soon as possible.  One of the nurses will contact you 24 hours after your treatment. Please let the nurse know about any problems that you may have experienced. Feel free to call the clinic you have any questions or concerns. The clinic phone number is [phone].   I have been informed and understand all the instructions given to me. I know to contact the clinic, my physician, or go to the Emergency Department if any problems should occur. I do not have any questions at this time, but understand that I may call the clinic during office hours   should I have any questions or need assistance in obtaining follow up care.    __________________________________________  _____________  __________ Signature of Patient or Authorized Representative            Date                   Time    __________________________________________ Nurse's Signature    

## 2012-09-23 ENCOUNTER — Ambulatory Visit: Payer: Medicare Other | Admitting: Nutrition

## 2012-09-23 ENCOUNTER — Other Ambulatory Visit: Payer: Self-pay | Admitting: *Deleted

## 2012-09-23 ENCOUNTER — Telehealth: Payer: Self-pay

## 2012-09-23 MED ORDER — LOSARTAN POTASSIUM 50 MG PO TABS: 50.0000 mg | ORAL_TABLET | Freq: Every day | ORAL | Status: DC

## 2012-09-23 NOTE — Telephone Encounter (Signed)
THIS REFILL REQUEST FOR REVLIMID WAS PLACED IN DR.HA'S ACTIVE WORK FOLDER. 

## 2012-09-23 NOTE — Telephone Encounter (Signed)
Will start losartan insrtead. I think the Tribenzor was too strong. Med sent to pharm.

## 2012-09-23 NOTE — Telephone Encounter (Signed)
Patient called and left a message on nurse line asking for a return call.   Returned Call: Left message asking patient to call back.  

## 2012-09-23 NOTE — Telephone Encounter (Signed)
Patient advised.

## 2012-09-23 NOTE — Progress Notes (Signed)
Pt is a 76 year old female with Dx of multiple myeloma that is receiving chemo.    PMH include HLD, Anemia, hyperglycemia, SDH, HTN, Depression, open area on buttock, being treated by wound center, and ARF.  Meds include Alprazoam, Crestor, Decadron, Revlimid, Zofran, K-dur, and Compazine.  Labs reviewed.   Ht 63" Wt 160#  UBW 200# 3-4 months ago per pt BMI 28.34  Pt reports appetite and po have greatly improved, but did go thought a time when she was not eating well.  Dietary recall reveals pt is eating 3 regular meals/day and some snacks. Drinking 1 Ensure almost every day. No other issues related to nutrition at this time.    Nutrition Diagnosis:  Unintended wt loss related to Dx and associated treatment of Multiple Myeloma as evidenced by 20% wt loss in 3-4 months.  Intervention:  Educated pt and pt's husband on high kcal/high protein diet for wt maintenance and healing.  Encouraged small frequent meals and snacks.  Recommend supplements 1/day to increase kcals and protein.  Reviewed tips on coping with poor appetite.  Provided sample of Ensure plus, Boost high protein and provided 1 case of Ensure plus.  Pt and pt's husband seemed to have a good understanding of the information and pt appeared motivated to follow suggestions.    Monitoring, evaluation, goals: Pt will maintain oral intake to minimize further wt loss and promote wound healing.   Next visit:  To be scheduled by pt if needed. Phone number for Vernell Leep, RD provided.

## 2012-09-23 NOTE — Telephone Encounter (Signed)
Blood pressure - readings - She was advised to stop her blood pressure medication by the cancer center. She states her blood pressure is elevated. Denies shortness of breath, chest pain or headaches.  153/92 159/93 146/89 131/90 126/82 165/102 158/97 170/99 150/88

## 2012-09-27 ENCOUNTER — Other Ambulatory Visit: Payer: Self-pay | Admitting: *Deleted

## 2012-09-27 DIAGNOSIS — C9000 Multiple myeloma not having achieved remission: Secondary | ICD-10-CM

## 2012-09-27 MED ORDER — LENALIDOMIDE 15 MG PO CAPS: 15.0000 mg | ORAL_CAPSULE | Freq: Every day | ORAL | Status: DC

## 2012-09-27 NOTE — Telephone Encounter (Signed)
Called pt at home to do pt survey for Revlimid,  Done w/ pt online.  Rx faxed to [facility] #[id number].

## 2012-09-29 ENCOUNTER — Encounter (HOSPITAL_BASED_OUTPATIENT_CLINIC_OR_DEPARTMENT_OTHER): Payer: Medicare Other | Attending: General Surgery

## 2012-09-29 DIAGNOSIS — L89109 Pressure ulcer of unspecified part of back, unspecified stage: Secondary | ICD-10-CM | POA: Insufficient documentation

## 2012-09-29 DIAGNOSIS — L8993 Pressure ulcer of unspecified site, stage 3: Secondary | ICD-10-CM | POA: Insufficient documentation

## 2012-09-30 ENCOUNTER — Other Ambulatory Visit: Payer: Self-pay | Admitting: Oncology

## 2012-09-30 NOTE — Telephone Encounter (Signed)
Called husband to notify of refill hydrocodone called into [facility].  He verbalized understanding.

## 2012-10-02 ENCOUNTER — Other Ambulatory Visit: Payer: Self-pay | Admitting: Family Medicine

## 2012-10-03 ENCOUNTER — Telehealth: Payer: Self-pay | Admitting: *Deleted

## 2012-10-03 NOTE — Telephone Encounter (Signed)
Pt called for refill on her crestor and xanax.  Informed pt those are from Dr. Linford Arnold and to call her office for refills or call her pharmacy to request refills.  She verbalized understanding.

## 2012-10-06 ENCOUNTER — Other Ambulatory Visit (HOSPITAL_BASED_OUTPATIENT_CLINIC_OR_DEPARTMENT_OTHER): Payer: Medicare Other | Admitting: Lab

## 2012-10-06 ENCOUNTER — Telehealth: Payer: Self-pay | Admitting: Oncology

## 2012-10-06 ENCOUNTER — Ambulatory Visit (HOSPITAL_BASED_OUTPATIENT_CLINIC_OR_DEPARTMENT_OTHER): Payer: Medicare Other | Admitting: Oncology

## 2012-10-06 ENCOUNTER — Ambulatory Visit (HOSPITAL_BASED_OUTPATIENT_CLINIC_OR_DEPARTMENT_OTHER): Payer: Medicare Other

## 2012-10-06 VITALS — BP 152/96 | HR 103 | Temp 97.0°F | Resp 20 | Ht 63.0 in | Wt 156.6 lb

## 2012-10-06 DIAGNOSIS — C9000 Multiple myeloma not having achieved remission: Secondary | ICD-10-CM

## 2012-10-06 DIAGNOSIS — R110 Nausea: Secondary | ICD-10-CM

## 2012-10-06 DIAGNOSIS — E440 Moderate protein-calorie malnutrition: Secondary | ICD-10-CM

## 2012-10-06 DIAGNOSIS — D630 Anemia in neoplastic disease: Secondary | ICD-10-CM

## 2012-10-06 DIAGNOSIS — Z5112 Encounter for antineoplastic immunotherapy: Secondary | ICD-10-CM

## 2012-10-06 LAB — CBC WITH DIFFERENTIAL/PLATELET
Eosinophils Absolute: 0.2 10*3/uL (ref 0.0–0.5)
HCT: 30.7 % — ABNORMAL LOW (ref 34.8–46.6)
LYMPH%: 25.2 % (ref 14.0–49.7)
MCHC: 33.2 g/dL (ref 31.5–36.0)
MCV: 95 fL (ref 79.5–101.0)
MONO#: 0.4 10*3/uL (ref 0.1–0.9)
MONO%: 9 % (ref 0.0–14.0)
NEUT#: 2.8 10*3/uL (ref 1.5–6.5)
NEUT%: 60.4 % (ref 38.4–76.8)
Platelets: 203 10*3/uL (ref 145–400)
WBC: 4.7 10*3/uL (ref 3.9–10.3)

## 2012-10-06 LAB — COMPREHENSIVE METABOLIC PANEL (CC13)
ALT: 12 U/L (ref 0–55)
AST: 17 U/L (ref 5–34)
Chloride: 106 mEq/L (ref 98–107)
Creatinine: 1.7 mg/dL — ABNORMAL HIGH (ref 0.6–1.1)
Sodium: 143 mEq/L (ref 136–145)
Total Bilirubin: 0.41 mg/dL (ref 0.20–1.20)
Total Protein: 6.7 g/dL (ref 6.4–8.3)

## 2012-10-06 MED ORDER — BORTEZOMIB CHEMO SQ INJECTION 3.5 MG (2.5MG/ML)
1.3000 mg/m2 | Freq: Once | INTRAMUSCULAR | Status: AC
  Administered 2012-10-06: 2.5 mg via SUBCUTANEOUS
  Filled 2012-10-06: qty 2.5

## 2012-10-06 MED ORDER — ONDANSETRON HCL 8 MG PO TABS
8.0000 mg | ORAL_TABLET | Freq: Once | ORAL | Status: AC
  Administered 2012-10-06: 8 mg via ORAL

## 2012-10-06 NOTE — Patient Instructions (Addendum)
[facility] Discharge Instructions for Patients Receiving Chemotherapy  Today you received the following chemotherapy agents: Velcade. To help prevent nausea and vomiting after your treatment, we encourage you to take your nausea medication.  If you develop nausea and vomiting that is not controlled by your nausea medication, call the clinic.   BELOW ARE SYMPTOMS THAT SHOULD BE REPORTED IMMEDIATELY:  *FEVER GREATER THAN 100.5 F  *CHILLS WITH OR WITHOUT FEVER  NAUSEA AND VOMITING THAT IS NOT CONTROLLED WITH YOUR NAUSEA MEDICATION  *UNUSUAL SHORTNESS OF BREATH  *UNUSUAL BRUISING OR BLEEDING  TENDERNESS IN MOUTH AND THROAT WITH OR WITHOUT PRESENCE OF ULCERS  *URINARY PROBLEMS  *BOWEL PROBLEMS  UNUSUAL RASH Items with * indicate a potential emergency and should be followed up as soon as possible.  Feel free to call the clinic you have any questions or concerns. The clinic phone number is [phone].    

## 2012-10-06 NOTE — Telephone Encounter (Signed)
appts made and printed for pt  °

## 2012-10-06 NOTE — Patient Instructions (Addendum)
1.  Multiple myeloma. 2.  Status:  Continue to have response.  However response maybe hitting plateau soon. 3.  Recommendation:  Continue Velcade injection once a week; and oral chemo pill Revlimid (days 1 through 21); every 4 weeks cycle until no further decrease in monoclonal protein.   4.  Follow up: in one month.

## 2012-10-06 NOTE — Progress Notes (Signed)
[facility]  [phone] [phone]   OFFICE PROGRESS NOTE   Cc:  METHENEY,CATHERINE, MD  DIAGNOSIS: IgA kappa multiple myeloma. She presented with diffuse lytic bone lesions, renal insufficiency, hypercalcemia, anemia. Her M-spike was 4.06 gm/dL. Her IgA was elevated at 4,020 mg/dL; IgG 353; IgM 5; serum kappa was 2.11; lambda 0.60; serum kappa to lambda ratio elevated at 3.06. Spot urine showed elevated kappa:lambda at 579; and positive for Bence Jones protein. Bone marrow biopsy on 04/08/2012 showed 92% plasma cell. Cytogenetics showed multiple chromosomal abnormalities; additional chromosomal material on chromosome 8p; 14q; loss of chromosome 8 and 13. 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   CURRENT THERAPY: Started on SQ Velcade 1.3mg /m2 on 04/12/2012; weekly; 3 weeks on; 1 week off. She started on Revlimid 15mg  PO daily d1-21 on 04/21/2012. She is also on Dexamethasone 40mg  PO once weekly. She also received palliative radiation 20 Gy in 8 fractions to the bilateral hips.    INTERVAL HISTORY: [name] 77 y.o. female returns for regular follow up with her son and husband for regular follow up.  She reports having good days and bad days.  When she thinks about her diagnosis, she has a bad day and does not feel like doing anything without any physical complaint.  She has mild fatigue most days; however, she is independent of activities of daily living. She likes to go to beauty salon to get her hair and nail done.  She has chronic lower back pain and hip pain for years.  However, the level of pain is much improved compared to before starting chemo.  She is ambulatory at home without walker; however, she uses one for long distant ambulation.   Patient denies fever, anorexia, weight loss, headache, visual changes, confusion, drenching night sweats, palpable lymph node swelling, mucositis, odynophagia, dysphagia, nausea vomiting, jaundice, chest  pain, palpitation, shortness of breath, dyspnea on exertion, productive cough, gum bleeding, epistaxis, hematemesis, hemoptysis, abdominal pain, abdominal swelling, early satiety, melena, hematochezia, hematuria, skin rash, spontaneous bleeding, joint swelling,heat or cold intolerance, bowel bladder incontinence, focal motor weakness, paresthesia, depression, suicidal or homicidal ideation, feeling hopelessness.     Past Medical History  Diagnosis Date  . Anxiety   . Hypertension   . Hyperlipidemia   . Depression   . Osteopenia   . Multiple myeloma 03/2012  . Acute renal failure      due to hypercalcemia and suspected multiple myeloma resolved =with IVF's    . Lytic bone lesions on xray   . Acute subdural hematoma 03/2012     recent fall in hospital stay,resolved+  . Normocytic anemia     due to chemo and multiple myeloma,no active bleding  . Cancer     mul;tiple myeloma, bone lesions r 7th rib,   . Arthritis     spine  . Atrial fibrillation   . Radiation 05/09/12-05/18/12    20 gray in 8 Fx's right hip palliative  . Status post chemotherapy     velcade and revlimid    Past Surgical History  Procedure Date  . Breast lumpectomy   . 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 Tonsillectomy     age 44    Current Outpatient Prescriptions  Medication Sig Dispense Refill  . acetaminophen (TYLENOL) 500 MG tablet Take 1-2 tablets (500-1,000 mg total) by mouth every 4 (four) hours as needed. No more than 6 tabs  in one day.  50 tablet  2  . ALPRAZolam (XANAX) 1 MG tablet TAKE 1 TABLET DAILY AS NEEDED FOR SLEEP  60 tablet  0  . CRESTOR 10 MG tablet TAKE 1 TABLET AT BEDTIME.  30 tablet  3  . cyclobenzaprine (FLEXERIL) 5 MG tablet Take 5 mg by mouth Daily.      Marland Kitchen dexamethasone (DECADRON) 4 MG tablet Take 10 tablets (40 mg total) by mouth every Tuesday. with chemotherapy drug  40 tablet  4  .  fentaNYL (DURAGESIC - DOSED MCG/HR) 25 MCG/HR Place 1 patch (25 mcg total) onto the skin every 3 (three) days. As directed  10 patch  0  . HYDROcodone-acetaminophen (VICODIN) 5-500 MG per tablet TAKE 1 TABLET EVERY 6 HOURS AS NEEDED FOR PAIN  30 tablet  0  . lenalidomide (REVLIMID) 15 MG capsule Take 1 capsule (15 mg total) by mouth daily.  21 capsule  0  . losartan (COZAAR) 50 MG tablet Take 1 tablet (50 mg total) by mouth daily.  30 tablet  1  . ondansetron (ZOFRAN) 4 MG tablet Take 4 mg by mouth. Take 1 tablet every 8 hours as needed for nausea      . potassium chloride SA (K-DUR,KLOR-CON) 20 MEQ tablet TAKE 1 TABLET DAILY.  30 tablet  2  . prochlorperazine (COMPAZINE) 10 MG tablet Take 10 mg by mouth. Take 1 tablet every 6 hours as directed      . sulfamethoxazole-trimethoprim (BACTRIM DS) 800-160 MG per tablet Take 1 tablet by mouth 3 (three) times a week.      . warfarin (COUMADIN) 4 MG tablet Take 4 mg by mouth daily. Coumadin 2 mg daily        ALLERGIES:  is allergic to simvastatin.  REVIEW OF SYSTEMS:  The rest of the 14-point review of system was negative.   Filed Vitals:   10/06/12 1333  BP: 152/96  Pulse: 103  Temp: 97 F (36.1 C)  Resp: 20   Wt Readings from Last 3 Encounters:  10/06/12 156 lb 9.6 oz (71.033 kg)  09/15/12 155 lb 6.4 oz (70.489 kg)  09/08/12 155 lb 11.2 oz (70.625 kg)   ECOG Performance status: 1  PHYSICAL EXAMINATION:   General:  well-nourished woman, in no acute distress.  Eyes:  no scleral icterus.  ENT:  There were no oropharyngeal lesions.  Neck was without thyromegaly.  Lymphatics:  Negative cervical, supraclavicular or axillary adenopathy.  Respiratory: lungs were clear bilaterally without wheezing or crackles.  Cardiovascular:  Regular rate and rhythm, S1/S2, without murmur, rub or gallop.  There was no pedal edema.  GI:  abdomen was soft, flat, nontender, nondistended, without organomegaly.  Muscoloskeletal:  no spinal tenderness of palpation of  vertebral spine.  Skin exam was without echymosis, petichae.  Neuro exam was nonfocal.  Patient was able to get on and off exam table without assistance.  Gait was slow using a walker.  Patient was alerted and oriented.  Attention was good.   Language was appropriate.  Mood was normal without depression.  Speech was not pressured.  Thought content was not tangential.     LABORATORY/RADIOLOGY DATA:  Lab Results  Component Value Date   WBC 4.7 10/06/2012   HGB 10.2* 10/06/2012   HCT 30.7* 10/06/2012   PLT 203 10/06/2012   GLUCOSE 113* 10/06/2012   CHOL 255* 03/03/2012   TRIG 117 03/03/2012   HDL 62 03/03/2012   LDLDIRECT 166* 04/24/2011   LDLCALC 170* 03/03/2012  ALKPHOS 46 10/06/2012   ALT 12 10/06/2012   AST 17 10/06/2012   NA 143 10/06/2012   K 3.9 10/06/2012   CL 106 10/06/2012   CREATININE 1.7* 10/06/2012   BUN 18.0 10/06/2012   CO2 23 10/06/2012   INR 2.5* 08/29/2012   HGBA1C 6.6* 05/03/2012     ASSESSMENT AND PLAN:   1. Hypertension: She is on losartan per cardiology.  2. Hyperlipidemia: She is on Crestor per PCP.  3. Dconditioning due to bone pain: much improved now compared to before.  4. Lytic bone lesions causing bone pain: much improved with radiation. I will continue with monthly Zometa.  5. Mild chronic normocytic anemia: due to myeloma, its treatment, and chronic disease.  There is no active bleeding.  There is no indication for transfusion.  6. Nausea: She has Compazine and Zofran prn.  7. History of Afib:  She is on Coumadin per PCP.  8. Moderate calorie/protein malnutrition: From myeloma, and its treatment. This is also improved.  9. Multiple myeloma:  -Status:  Continue to have response.  However response maybe hitting plateau soon. She was deemed not candidate for auto BMT per [facility].  - Recommendation:  Continue Velcade injection once a week; and oral chemo pill Revlimid (days 1 through 21); every 4 weeks cycle for this month.  She has no dose limiting toxicity and does not warrant dose  modification at this time.   I sent for her IgA today.  If it is normal, I will restage her with repeat bone marrow biopsy.  .    10.  Prophylaxis:  - Continue Acyclovir 200 mg, by mouth, once daily, everyday (to prevent viral infection).  - Continue Bactrim double strength (DS), every Monday, Wednesday, Friday (to prevent bacterial infection).  - She is on Coumadin for afib per PCP.   11. Follow up: in one month.

## 2012-10-10 LAB — PROTEIN ELECTROPHORESIS, SERUM
Alpha-1-Globulin: 6 % — ABNORMAL HIGH (ref 2.9–4.9)
Alpha-2-Globulin: 14.9 % — ABNORMAL HIGH (ref 7.1–11.8)
Beta Globulin: 7.4 % — ABNORMAL HIGH (ref 4.7–7.2)
Total Protein, Serum Electrophoresis: 6.5 g/dL (ref 6.0–8.3)

## 2012-10-10 LAB — KAPPA/LAMBDA LIGHT CHAINS: Kappa:Lambda Ratio: 1.22 (ref 0.26–1.65)

## 2012-10-13 ENCOUNTER — Ambulatory Visit (HOSPITAL_BASED_OUTPATIENT_CLINIC_OR_DEPARTMENT_OTHER): Payer: Medicare Other

## 2012-10-13 ENCOUNTER — Other Ambulatory Visit: Payer: Self-pay | Admitting: Oncology

## 2012-10-13 ENCOUNTER — Other Ambulatory Visit: Payer: Medicare Other | Admitting: Lab

## 2012-10-13 VITALS — BP 113/75 | HR 86 | Temp 98.2°F

## 2012-10-13 DIAGNOSIS — M545 Low back pain, unspecified: Secondary | ICD-10-CM

## 2012-10-13 DIAGNOSIS — E785 Hyperlipidemia, unspecified: Secondary | ICD-10-CM

## 2012-10-13 DIAGNOSIS — C9000 Multiple myeloma not having achieved remission: Secondary | ICD-10-CM

## 2012-10-13 DIAGNOSIS — F411 Generalized anxiety disorder: Secondary | ICD-10-CM

## 2012-10-13 DIAGNOSIS — F3289 Other specified depressive episodes: Secondary | ICD-10-CM

## 2012-10-13 DIAGNOSIS — Z5112 Encounter for antineoplastic immunotherapy: Secondary | ICD-10-CM

## 2012-10-13 DIAGNOSIS — E860 Dehydration: Secondary | ICD-10-CM

## 2012-10-13 DIAGNOSIS — C9030 Solitary plasmacytoma not having achieved remission: Secondary | ICD-10-CM

## 2012-10-13 DIAGNOSIS — N179 Acute kidney failure, unspecified: Secondary | ICD-10-CM

## 2012-10-13 DIAGNOSIS — S065XAA Traumatic subdural hemorrhage with loss of consciousness status unknown, initial encounter: Secondary | ICD-10-CM

## 2012-10-13 DIAGNOSIS — M25559 Pain in unspecified hip: Secondary | ICD-10-CM

## 2012-10-13 DIAGNOSIS — D649 Anemia, unspecified: Secondary | ICD-10-CM

## 2012-10-13 DIAGNOSIS — E669 Obesity, unspecified: Secondary | ICD-10-CM

## 2012-10-13 DIAGNOSIS — F329 Major depressive disorder, single episode, unspecified: Secondary | ICD-10-CM

## 2012-10-13 DIAGNOSIS — K5901 Slow transit constipation: Secondary | ICD-10-CM

## 2012-10-13 DIAGNOSIS — M899 Disorder of bone, unspecified: Secondary | ICD-10-CM

## 2012-10-13 DIAGNOSIS — N63 Unspecified lump in unspecified breast: Secondary | ICD-10-CM

## 2012-10-13 DIAGNOSIS — M898X9 Other specified disorders of bone, unspecified site: Secondary | ICD-10-CM

## 2012-10-13 DIAGNOSIS — I10 Essential (primary) hypertension: Secondary | ICD-10-CM

## 2012-10-13 DIAGNOSIS — R7989 Other specified abnormal findings of blood chemistry: Secondary | ICD-10-CM

## 2012-10-13 DIAGNOSIS — G4700 Insomnia, unspecified: Secondary | ICD-10-CM

## 2012-10-13 DIAGNOSIS — S065X9A Traumatic subdural hemorrhage with loss of consciousness of unspecified duration, initial encounter: Secondary | ICD-10-CM

## 2012-10-13 DIAGNOSIS — Z780 Asymptomatic menopausal state: Secondary | ICD-10-CM

## 2012-10-13 LAB — CBC WITH DIFFERENTIAL/PLATELET
Basophils Absolute: 0 10*3/uL (ref 0.0–0.1)
Eosinophils Absolute: 0.3 10*3/uL (ref 0.0–0.5)
HCT: 30.8 % — ABNORMAL LOW (ref 34.8–46.6)
LYMPH%: 22.5 % (ref 14.0–49.7)
MCV: 97.8 fL (ref 79.5–101.0)
MONO#: 0.9 10*3/uL (ref 0.1–0.9)
NEUT#: 3.9 10*3/uL (ref 1.5–6.5)
NEUT%: 59.7 % (ref 38.4–76.8)
Platelets: 139 10*3/uL — ABNORMAL LOW (ref 145–400)
WBC: 6.5 10*3/uL (ref 3.9–10.3)

## 2012-10-13 LAB — BASIC METABOLIC PANEL (CC13)
Calcium: 8.9 mg/dL (ref 8.4–10.4)
Creatinine: 1.2 mg/dL — ABNORMAL HIGH (ref 0.6–1.1)

## 2012-10-13 MED ORDER — ONDANSETRON HCL 8 MG PO TABS
8.0000 mg | ORAL_TABLET | Freq: Once | ORAL | Status: AC
  Administered 2012-10-13: 8 mg via ORAL

## 2012-10-13 MED ORDER — ZOLEDRONIC ACID 4 MG/100ML IV SOLN
4.0000 mg | Freq: Once | INTRAVENOUS | Status: AC
  Administered 2012-10-13: 4 mg via INTRAVENOUS
  Filled 2012-10-13: qty 100

## 2012-10-13 MED ORDER — BORTEZOMIB CHEMO SQ INJECTION 3.5 MG (2.5MG/ML)
1.3000 mg/m2 | Freq: Once | INTRAMUSCULAR | Status: AC
  Administered 2012-10-13: 2.5 mg via SUBCUTANEOUS
  Filled 2012-10-13: qty 2.5

## 2012-10-13 NOTE — Patient Instructions (Signed)
[facility] Discharge Instructions for Patients Receiving Chemotherapy  Today you received the following chemotherapy agents Velcade.  To help prevent nausea and vomiting after your treatment, we encourage you to take your nausea medication as prescribed.   If you develop nausea and vomiting that is not controlled by your nausea medication, call the clinic. If it is after clinic hours your family physician or the after hours number for the clinic or go to the Emergency Department.   BELOW ARE SYMPTOMS THAT SHOULD BE REPORTED IMMEDIATELY:  *FEVER GREATER THAN 100.5 F  *CHILLS WITH OR WITHOUT FEVER  NAUSEA AND VOMITING THAT IS NOT CONTROLLED WITH YOUR NAUSEA MEDICATION  *UNUSUAL SHORTNESS OF BREATH  *UNUSUAL BRUISING OR BLEEDING  TENDERNESS IN MOUTH AND THROAT WITH OR WITHOUT PRESENCE OF ULCERS  *URINARY PROBLEMS  *BOWEL PROBLEMS  UNUSUAL RASH Items with * indicate a potential emergency and should be followed up as soon as possible.  One of the nurses will contact you 24 hours after your treatment. Please let the nurse know about any problems that you may have experienced. Feel free to call the clinic you have any questions or concerns. The clinic phone number is [phone].   I have been informed and understand all the instructions given to me. I know to contact the clinic, my physician, or go to the Emergency Department if any problems should occur. I do not have any questions at this time, but understand that I may call the clinic during office hours   should I have any questions or need assistance in obtaining follow up care.    __________________________________________  _____________  __________ Signature of Patient or Authorized Representative            Date                   Time    __________________________________________ Nurse's Signature    

## 2012-10-14 ENCOUNTER — Telehealth: Payer: Self-pay | Admitting: *Deleted

## 2012-10-14 ENCOUNTER — Encounter: Payer: Self-pay | Admitting: Family Medicine

## 2012-10-14 ENCOUNTER — Ambulatory Visit (INDEPENDENT_AMBULATORY_CARE_PROVIDER_SITE_OTHER): Payer: Medicare Other | Admitting: Family Medicine

## 2012-10-14 ENCOUNTER — Other Ambulatory Visit: Payer: Self-pay | Admitting: *Deleted

## 2012-10-14 VITALS — BP 152/78 | HR 70 | Resp 18 | Wt 160.0 lb

## 2012-10-14 DIAGNOSIS — L89159 Pressure ulcer of sacral region, unspecified stage: Secondary | ICD-10-CM

## 2012-10-14 DIAGNOSIS — I4891 Unspecified atrial fibrillation: Secondary | ICD-10-CM

## 2012-10-14 DIAGNOSIS — L8990 Pressure ulcer of unspecified site, unspecified stage: Secondary | ICD-10-CM

## 2012-10-14 DIAGNOSIS — Z8679 Personal history of other diseases of the circulatory system: Secondary | ICD-10-CM

## 2012-10-14 DIAGNOSIS — L89109 Pressure ulcer of unspecified part of back, unspecified stage: Secondary | ICD-10-CM

## 2012-10-14 DIAGNOSIS — I10 Essential (primary) hypertension: Secondary | ICD-10-CM

## 2012-10-14 MED ORDER — HYDROCODONE-ACETAMINOPHEN 5-500 MG PO TABS: 1.0000 | ORAL_TABLET | Freq: Four times a day (QID) | ORAL | Status: DC | PRN

## 2012-10-14 NOTE — Telephone Encounter (Signed)
Pt called for refill on hydrocodone.  States she is taking about 2 per day but sometimes 3 depending on pain.  States her legs and back hurting more than usual this morning.  She had a long day of doctor's appts w/ her husband.  Reports still on fentanyl patch and will need refill on that next week.  Informed pt we will call in hydrocodone to pharmacy but she has to pick up rx for fentanyl patch and to call us a few days before she needs that refilled.  She verbalized understanding.

## 2012-10-14 NOTE — Progress Notes (Signed)
    Subjective:    Patient ID: [name], female    DOB: November 15, 1935, 77 y.o.   MRN: [id number]  HPI HTN -she brought in a record of daily blood pressures from home. Home BPs range 106-168/69-88.   Pt denies chest pain, SOB, dizziness, or heart palpitations.  Taking meds as directed w/o problems.  Denies medication side effects.  5 min spent with pt. Her BP was too low on the Tribenzor. Now on Losartan.  She is ff the diuretci.   Says her appetite is better.    Still going to the wound center every week on sacrum.  It is slowly getting better.    Cancer center draws bloodwork weekly. On 1/2  of4mg  pill nightly. She says she thinks they check her coumadin too.  She does have some easy bruising on the backs of both hands. She's not bleeding while brushing her teeth. No recent antibiotic use.   Review of Systems     Objective:   Physical Exam  Constitutional: She is oriented to person, place, and time. She appears well-developed and well-nourished.  HENT:  Head: Normocephalic and atraumatic.  Cardiovascular: Normal rate, regular rhythm and normal heart sounds.   Pulmonary/Chest: Effort normal and breath sounds normal.  Neurological: She is alert and oriented to person, place, and time.  Skin: Skin is warm and dry.  Psychiatric: She has a normal mood and affect. Her behavior is normal.          Assessment & Plan:  HTN- Well controlled. I think stopping the Tribenzor has normalized or pressure. She's doing well 50 mg of losartan so far. Home blood pressures look fantastic. There is a bit of a range but I think the median is at goal. I think her diet and fluid intake fluctuated on day-to-day basis as well pending her how she feels.  Coumadin therapy-patient did have a transient history of atrial fibrillation but is primarily on Coumadin because of her cancer status. Thus they are just keeping her on a low dose and she does not have a target therapeutic range. They're monitoring her  monthly at the cancer Center. We did call to confirm all of this.  Decubitus ulcer - Seeing wound center weekly.   Multiple myeloma-she's currently getting injections for chemotherapy once a week. She did have to skip last week because her kidney function was elevated. She had repeat blood work yesterday showing that her creatinine was back down to 1.2.

## 2012-10-16 ENCOUNTER — Other Ambulatory Visit: Payer: Self-pay | Admitting: Oncology

## 2012-10-20 ENCOUNTER — Ambulatory Visit (HOSPITAL_BASED_OUTPATIENT_CLINIC_OR_DEPARTMENT_OTHER): Payer: Medicare Other

## 2012-10-20 ENCOUNTER — Ambulatory Visit (HOSPITAL_BASED_OUTPATIENT_CLINIC_OR_DEPARTMENT_OTHER): Payer: Medicare Other | Admitting: Lab

## 2012-10-20 ENCOUNTER — Other Ambulatory Visit: Payer: Self-pay | Admitting: Oncology

## 2012-10-20 ENCOUNTER — Other Ambulatory Visit: Payer: Self-pay | Admitting: *Deleted

## 2012-10-20 VITALS — BP 108/65 | HR 80 | Temp 98.2°F | Resp 20

## 2012-10-20 DIAGNOSIS — C9000 Multiple myeloma not having achieved remission: Secondary | ICD-10-CM

## 2012-10-20 DIAGNOSIS — Z5112 Encounter for antineoplastic immunotherapy: Secondary | ICD-10-CM

## 2012-10-20 LAB — CBC WITH DIFFERENTIAL/PLATELET
Basophils Absolute: 0 10*3/uL (ref 0.0–0.1)
EOS%: 7.7 % — ABNORMAL HIGH (ref 0.0–7.0)
HGB: 9.5 g/dL — ABNORMAL LOW (ref 11.6–15.9)
MCH: 32.4 pg (ref 25.1–34.0)
NEUT#: 4.5 10*3/uL (ref 1.5–6.5)
RDW: 17.4 % — ABNORMAL HIGH (ref 11.2–14.5)
WBC: 7.4 10*3/uL (ref 3.9–10.3)
lymph#: 1.5 10*3/uL (ref 0.9–3.3)

## 2012-10-20 MED ORDER — FENTANYL 25 MCG/HR TD PT72: 1.0000 | MEDICATED_PATCH | TRANSDERMAL | Status: DC

## 2012-10-20 MED ORDER — ONDANSETRON HCL 8 MG PO TABS
8.0000 mg | ORAL_TABLET | Freq: Once | ORAL | Status: AC
  Administered 2012-10-20: 8 mg via ORAL

## 2012-10-20 MED ORDER — BORTEZOMIB CHEMO SQ INJECTION 3.5 MG (2.5MG/ML)
1.3000 mg/m2 | Freq: Once | INTRAMUSCULAR | Status: AC
  Administered 2012-10-20: 2.5 mg via SUBCUTANEOUS
  Filled 2012-10-20: qty 2.5

## 2012-10-20 NOTE — Patient Instructions (Signed)
[facility] Discharge Instructions for Patients Receiving Chemotherapy  Today you received the following chemotherapy agents: Velcade  To help prevent nausea and vomiting after your treatment, we encourage you to take your nausea medication as directed by your MD.  If you develop nausea and vomiting that is not controlled by your nausea medication, call the clinic. If it is after clinic hours your family physician or the after hours number for the clinic or go to the Emergency Department.   BELOW ARE SYMPTOMS THAT SHOULD BE REPORTED IMMEDIATELY:  *FEVER GREATER THAN 100.5 F  *CHILLS WITH OR WITHOUT FEVER  NAUSEA AND VOMITING THAT IS NOT CONTROLLED WITH YOUR NAUSEA MEDICATION  *UNUSUAL SHORTNESS OF BREATH  *UNUSUAL BRUISING OR BLEEDING  TENDERNESS IN MOUTH AND THROAT WITH OR WITHOUT PRESENCE OF ULCERS  *URINARY PROBLEMS  *BOWEL PROBLEMS  UNUSUAL RASH Items with * indicate a potential emergency and should be followed up as soon as possible.   Feel free to call the clinic you have any questions or concerns. The clinic phone number is [phone].    

## 2012-10-25 ENCOUNTER — Ambulatory Visit: Payer: Medicare Other | Admitting: Oncology

## 2012-10-25 ENCOUNTER — Other Ambulatory Visit (HOSPITAL_BASED_OUTPATIENT_CLINIC_OR_DEPARTMENT_OTHER): Payer: Medicare Other | Admitting: Lab

## 2012-10-25 DIAGNOSIS — C9000 Multiple myeloma not having achieved remission: Secondary | ICD-10-CM

## 2012-10-25 LAB — CBC WITH DIFFERENTIAL/PLATELET
Eosinophils Absolute: 0.2 10*3/uL (ref 0.0–0.5)
MONO#: 0.4 10*3/uL (ref 0.1–0.9)
NEUT#: 3.5 10*3/uL (ref 1.5–6.5)
Platelets: 97 10*3/uL — ABNORMAL LOW (ref 145–400)
RBC: 3.07 10*6/uL — ABNORMAL LOW (ref 3.70–5.45)
RDW: 16.5 % — ABNORMAL HIGH (ref 11.2–14.5)
WBC: 4.8 10*3/uL (ref 3.9–10.3)
nRBC: 0 % (ref 0–0)

## 2012-10-25 NOTE — Progress Notes (Signed)
Pt unable to have bone marrow biopsy today because she took Coumadin last night.  Dr. Gaylyn Rong gave patient discharge instructions and follow-up time and appointment.

## 2012-10-25 NOTE — Progress Notes (Signed)
Procedure was not performed since patient was still on Coumadin.  Rescheduled for next week.

## 2012-10-31 ENCOUNTER — Other Ambulatory Visit: Payer: Self-pay | Admitting: *Deleted

## 2012-10-31 DIAGNOSIS — C9000 Multiple myeloma not having achieved remission: Secondary | ICD-10-CM

## 2012-10-31 MED ORDER — LENALIDOMIDE 15 MG PO CAPS: 15.0000 mg | ORAL_CAPSULE | Freq: Every day | ORAL | Status: DC

## 2012-10-31 NOTE — Telephone Encounter (Signed)
THIS REFILL REQUEST FOR REVLIMID WAS PLACED IN DR.HA'S ACTIVE WORK FOLDER. 

## 2012-11-01 ENCOUNTER — Other Ambulatory Visit (HOSPITAL_BASED_OUTPATIENT_CLINIC_OR_DEPARTMENT_OTHER): Payer: Medicare Other | Admitting: Lab

## 2012-11-01 ENCOUNTER — Other Ambulatory Visit (HOSPITAL_COMMUNITY)
Admission: RE | Admit: 2012-11-01 | Discharge: 2012-11-01 | Disposition: A | Discharge status: Home / Self Care | Payer: Medicare Other | Source: Ambulatory Visit | Attending: Oncology | Admitting: Oncology

## 2012-11-01 ENCOUNTER — Ambulatory Visit (HOSPITAL_BASED_OUTPATIENT_CLINIC_OR_DEPARTMENT_OTHER): Payer: Medicare Other | Admitting: Oncology

## 2012-11-01 VITALS — BP 140/72 | HR 75 | Temp 98.2°F | Resp 18

## 2012-11-01 DIAGNOSIS — C9000 Multiple myeloma not having achieved remission: Secondary | ICD-10-CM

## 2012-11-01 DIAGNOSIS — I4891 Unspecified atrial fibrillation: Secondary | ICD-10-CM

## 2012-11-01 LAB — BONE MARROW EXAM: Bone Marrow Exam: 87

## 2012-11-01 LAB — CBC WITH DIFFERENTIAL/PLATELET
Basophils Absolute: 0 10*3/uL (ref 0.0–0.1)
Eosinophils Absolute: 0.1 10*3/uL (ref 0.0–0.5)
HGB: 10.1 g/dL — ABNORMAL LOW (ref 11.6–15.9)
LYMPH%: 13.1 % — ABNORMAL LOW (ref 14.0–49.7)
MCV: 99.2 fL (ref 79.5–101.0)
MONO#: 0.3 10*3/uL (ref 0.1–0.9)
NEUT#: 3.2 10*3/uL (ref 1.5–6.5)
Platelets: 183 10*3/uL (ref 145–400)
RBC: 2.93 10*6/uL — ABNORMAL LOW (ref 3.70–5.45)
RDW: 16.7 % — ABNORMAL HIGH (ref 11.2–14.5)
WBC: 4.2 10*3/uL (ref 3.9–10.3)

## 2012-11-01 LAB — PROTIME-INR
INR: 1.1 — ABNORMAL LOW (ref 2.00–3.50)
Protime: 13.2 Seconds (ref 10.6–13.4)

## 2012-11-01 NOTE — Progress Notes (Signed)
Please see bone marrow biopsy procedure dated 11/01/12.

## 2012-11-01 NOTE — Telephone Encounter (Signed)
RECEIVED A FAX FROM [facility] CONCERNING A CONFIRMATION OF FACSIMILE RECEIPT FOR PT. REFERRAL. 

## 2012-11-01 NOTE — Progress Notes (Signed)
Pt tolerated procedure well. Pressure dressing applied to right hip site. Understands discharge instructions.

## 2012-11-01 NOTE — Patient Instructions (Signed)
[facility] Discharge Instructions for Post Bone Marrow Procedure  Today you had a bone marrow biopsy and aspirate of your right hip.   Please keep the pressure dressing in place for at least 24 hours.  Have someone check your dressing periodically for bleeding.  If needed you can reapply a pressure dressing to the site.  Take pain medication as directed.  IF BLEEDING REOCCURS THAT SHOULD BE REPORTED IMMEDIATELY. Call the Cancer Center at [phone] if during business hours. Or report to the Emergency Room.   I have been informed and understand all the instructions given to me. I know to contact the clinic, my physician, or go to the Emergency Department if any problems should occur. I do not have any questions at this time, but understand that I may call the clinic during office hours at [phone]  should I have any questions or need assistance in obtaining follow up care.    __________________________________________  _____________  __________ Signature of Patient or Authorized Representative            Date                   Time    __________________________________________ Nurse's Signature     

## 2012-11-01 NOTE — Procedures (Signed)
   [facility]  Telephone:(336) [phone] Fax:([phone]) [phone]   BONE MARROW BIOPSY AND ASPIRATION   INDICATION:  restaging for myeloma  Procedure: After obtained from consent, MAJORIE SANTEE was placed in the prone position. Time out was performed verifying correct patient and procedure. The skin overlying the left posterior crest was prepped with Betadine and draped in the usual sterile fashion. The skin and periosteum were infiltrated with 20 mL of 2% lidocaine. A small puncture wound was made with #11 scalpel blade.  Bone marrow aspirate was obtained on the first pass of the aspiration needle.  One separate core biopsy was obtained through the same incision.   The aspirate was sent for routine histology, flow cytometry, myeloma FISH panel, and cytogenetics.  Core biopsy was sent for routine histology.   Inetta Fermo tolerated procedure well with minimal  blood loss and without immediate complication.   A sterile dressing was applied.   [name] M.D. 11/01/2012

## 2012-11-03 ENCOUNTER — Other Ambulatory Visit (HOSPITAL_BASED_OUTPATIENT_CLINIC_OR_DEPARTMENT_OTHER): Payer: Medicare Other | Admitting: Lab

## 2012-11-03 ENCOUNTER — Encounter: Payer: Self-pay | Admitting: Oncology

## 2012-11-03 ENCOUNTER — Telehealth: Payer: Self-pay | Admitting: *Deleted

## 2012-11-03 ENCOUNTER — Ambulatory Visit (HOSPITAL_BASED_OUTPATIENT_CLINIC_OR_DEPARTMENT_OTHER): Payer: Medicare Other | Admitting: Oncology

## 2012-11-03 ENCOUNTER — Telehealth: Payer: Self-pay | Admitting: Oncology

## 2012-11-03 ENCOUNTER — Encounter (HOSPITAL_BASED_OUTPATIENT_CLINIC_OR_DEPARTMENT_OTHER): Payer: Medicare Other | Attending: Internal Medicine

## 2012-11-03 VITALS — BP 145/81 | HR 93 | Temp 97.4°F | Resp 22 | Ht 63.0 in | Wt 158.4 lb

## 2012-11-03 DIAGNOSIS — L89109 Pressure ulcer of unspecified part of back, unspecified stage: Secondary | ICD-10-CM | POA: Insufficient documentation

## 2012-11-03 DIAGNOSIS — L8993 Pressure ulcer of unspecified site, stage 3: Secondary | ICD-10-CM | POA: Insufficient documentation

## 2012-11-03 DIAGNOSIS — C9000 Multiple myeloma not having achieved remission: Secondary | ICD-10-CM

## 2012-11-03 DIAGNOSIS — M899 Disorder of bone, unspecified: Secondary | ICD-10-CM

## 2012-11-03 DIAGNOSIS — D509 Iron deficiency anemia, unspecified: Secondary | ICD-10-CM

## 2012-11-03 DIAGNOSIS — I10 Essential (primary) hypertension: Secondary | ICD-10-CM

## 2012-11-03 LAB — CBC WITH DIFFERENTIAL/PLATELET
Eosinophils Absolute: 0 10*3/uL (ref 0.0–0.5)
HCT: 28.8 % — ABNORMAL LOW (ref 34.8–46.6)
LYMPH%: 29.8 % (ref 14.0–49.7)
MONO#: 1 10*3/uL — ABNORMAL HIGH (ref 0.1–0.9)
NEUT#: 4 10*3/uL (ref 1.5–6.5)
Platelets: 240 10*3/uL (ref 145–400)
RBC: 2.87 10*6/uL — ABNORMAL LOW (ref 3.70–5.45)
WBC: 7.2 10*3/uL (ref 3.9–10.3)

## 2012-11-03 LAB — COMPREHENSIVE METABOLIC PANEL (CC13)
Albumin: 3.4 g/dL — ABNORMAL LOW (ref 3.5–5.0)
CO2: 23 mEq/L (ref 22–29)
Calcium: 9.3 mg/dL (ref 8.4–10.4)
Glucose: 147 mg/dl — ABNORMAL HIGH (ref 70–99)
Sodium: 139 mEq/L (ref 136–145)
Total Bilirubin: 0.53 mg/dL (ref 0.20–1.20)
Total Protein: 6.2 g/dL — ABNORMAL LOW (ref 6.4–8.3)

## 2012-11-03 NOTE — Telephone Encounter (Signed)
gv an dprinted appt schedule for Feb and March...emailed Marcelino Duster to add tx

## 2012-11-03 NOTE — Telephone Encounter (Signed)
Per staff message and POF I have scheduled appts.  JMW  

## 2012-11-03 NOTE — Treatment Plan (Signed)
[facility] END OF TREATMENT   Name: [name] Date: 11/03/2012 MRN: [id number] DOB: 1936/04/13   TREATMENT DATES: 03/2012 to 09/2011.    REFERRING PHYSICIAN: Dr. Alden Hipp, M.D.   DIAGNOSIS: multiple myeloma.   STAGE AT START OF TREATMENT:  N.A.    INTENT:curative   DRUGS OR REGIMENS GIVEN: Velcade/Revlimid/Dex   MAJOR TOXICITIES:  none   REASON TREATMENT STOPPED:  Complete remission.    PERFORMANCE STATUS AT END: ECOG 1.    ONGOING PROBLEMS:  None.    FOLLOW UP PLANS:  Maintenance Revlimid/Dex; and Zometa.

## 2012-11-03 NOTE — Patient Instructions (Addendum)
1.  Diagnosis:  Multiple myeloma. 2.  Status: remission after 7 months of Velcade/Revlimid/Dexamethasone. 3.  Recommendation:  Just continue with Revlimid/Dexamethasone.  No more Velcade injection needed.  4.  Follow up:  Lab only in about 1 month. Return visit in about 2 months.

## 2012-11-03 NOTE — Progress Notes (Signed)
[facility]  [phone] [phone]   OFFICE PROGRESS NOTE   Cc:  METHENEY,CATHERINE, MD  DIAGNOSIS: IgA kappa multiple myeloma. She presented with diffuse lytic bone lesions, renal insufficiency, hypercalcemia, anemia. Her M-spike was 4.06 gm/dL. Her IgA was elevated at 4,020 mg/dL; IgG 353; IgM 5; serum kappa was 2.11; lambda 0.60; serum kappa to lambda ratio elevated at 3.06. Spot urine showed elevated kappa:lambda at 579; and positive for Bence Jones protein. Bone marrow biopsy on 04/08/2012 showed 92% plasma cell. Cytogenetics showed multiple chromosomal abnormalities; additional chromosomal material on chromosome 8p; 14q; loss of chromosome 8 and 13. 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   CURRENT THERAPY: Started on SQ Velcade 1.3mg /m2 on 04/12/2012; weekly; 3 weeks on; 1 week off. She started on Revlimid 15mg  PO daily d1-21 on 04/21/2012. She is also on Dexamethasone 40mg  PO once weekly. She also received palliative radiation 20 Gy in 8 fractions to the bilateral hips.    INTERVAL HISTORY: [name] 77 y.o. female returns for regular follow up with her husband for regular follow up. She reports doing well.  She has chronic lower back and bilateral hip discomfort requiring her to take Vicodin about 2x/day.  She has much more strength now in her legs compared to last year.    Patient denies fever, anorexia, weight loss, fatigue, headache, visual changes, confusion, drenching night sweats, palpable lymph node swelling, mucositis, odynophagia, dysphagia, nausea vomiting, jaundice, chest pain, palpitation, shortness of breath, dyspnea on exertion, productive cough, gum bleeding, epistaxis, hematemesis, hemoptysis, abdominal pain, abdominal swelling, early satiety, melena, hematochezia, hematuria, skin rash, spontaneous bleeding, heat or cold intolerance, bowel bladder incontinence, back pain, focal motor weakness, paresthesia, depression.         Past Medical History  Diagnosis Date  . Anxiety   . Hypertension   . Hyperlipidemia   . Depression   . Osteopenia   . Multiple myeloma 03/2012  . Acute renal failure      due to hypercalcemia and suspected multiple myeloma resolved =with IVF's    . Lytic bone lesions on xray   . Acute subdural hematoma 03/2012     recent fall in hospital stay,resolved+  . Normocytic anemia     due to chemo and multiple myeloma,no active bleding  . Cancer     mul;tiple myeloma, bone lesions r 7th rib,   . Arthritis     spine  . Atrial fibrillation   . Radiation 05/09/12-05/18/12    20 gray in 8 Fx's right hip palliative  . Status post chemotherapy     velcade and revlimid    Past Surgical History  Procedure Date  . Breast lumpectomy   . 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 Tonsillectomy     age 83    Current Outpatient Prescriptions  Medication Sig Dispense Refill  . acetaminophen (TYLENOL) 500 MG tablet Take 1-2 tablets (500-1,000 mg total) by mouth every 4 (four) hours as needed. No more than 6 tabs in one day.  50 tablet  2  . acyclovir (ZOVIRAX) 400 MG tablet Take 400 mg by mouth Daily.      Marland Kitchen ALPRAZolam (XANAX) 1 MG tablet TAKE 1 TABLET DAILY AS NEEDED FOR SLEEP  60 tablet  0  . CRESTOR 10 MG tablet TAKE 1 TABLET AT BEDTIME.  30 tablet  3  . dexamethasone (DECADRON) 4 MG tablet Take 10 tablets (  40 mg total) by mouth every Tuesday. with chemotherapy drug  40 tablet  4  . fentaNYL (DURAGESIC - DOSED MCG/HR) 25 MCG/HR Place 1 patch (25 mcg total) onto the skin every 3 (three) days. As directed  10 patch  0  . HYDROcodone-acetaminophen (VICODIN) 5-500 MG per tablet Take 1 tablet by mouth every 6 (six) hours as needed for pain.  60 tablet  0  . lenalidomide (REVLIMID) 15 MG capsule Take 1 capsule (15 mg total) by mouth daily.  21 capsule  0  . losartan (COZAAR) 50 MG tablet Take 1 tablet (50  mg total) by mouth daily.  30 tablet  1  . ondansetron (ZOFRAN) 4 MG tablet Take 4 mg by mouth. Take 1 tablet every 8 hours as needed for nausea      . potassium chloride SA (K-DUR,KLOR-CON) 20 MEQ tablet TAKE 1 TABLET DAILY.  30 tablet  2  . prochlorperazine (COMPAZINE) 10 MG tablet Take 10 mg by mouth. Take 1 tablet every 6 hours as directed      . sulfamethoxazole-trimethoprim (BACTRIM DS) 800-160 MG per tablet Take 1 tablet by mouth 3 (three) times a week.      . warfarin (COUMADIN) 4 MG tablet Take 4 mg by mouth daily. Coumadin 2 mg daily        ALLERGIES:  is allergic to simvastatin.  REVIEW OF SYSTEMS:  The rest of the 14-point review of system was negative.   Filed Vitals:   11/03/12 1347  BP: 145/81  Pulse: 93  Temp: 97.4 F (36.3 C)  Resp: 22   Wt Readings from Last 3 Encounters:  11/03/12 158 lb 6.4 oz (71.85 kg)  10/14/12 160 lb (72.576 kg)  10/06/12 156 lb 9.6 oz (71.033 kg)   ECOG Performance status: 1  PHYSICAL EXAMINATION:   General:  well-nourished woman, in no acute distress.  Eyes:  no scleral icterus.  ENT:  There were no oropharyngeal lesions.  Neck was without thyromegaly.  Lymphatics:  Negative cervical, supraclavicular or axillary adenopathy.  Respiratory: lungs were clear bilaterally without wheezing or crackles.  Cardiovascular:  Regular rate and rhythm, S1/S2, without murmur, rub or gallop.  There was no pedal edema.  GI:  abdomen was soft, flat, nontender, nondistended, without organomegaly.  Muscoloskeletal:  no spinal tenderness of palpation of vertebral spine.  Skin exam was without echymosis, petichae.  Neuro exam was nonfocal.  Patient was able to get on and off exam table without assistance.  Gait was slow using a walker.  Patient was alerted and oriented.  Attention was good.   Language was appropriate.  Mood was normal without depression.  Speech was not pressured.  Thought content was not tangential.     LABORATORY/RADIOLOGY DATA:  Lab Results    Component Value Date   WBC 7.2 11/03/2012   HGB 9.8* 11/03/2012   HCT 28.8* 11/03/2012   PLT 240 11/03/2012   GLUCOSE 147* 11/03/2012   CHOL 255* 03/03/2012   TRIG 117 03/03/2012   HDL 62 03/03/2012   LDLDIRECT 166* 04/24/2011   LDLCALC 170* 03/03/2012   ALKPHOS 44 11/03/2012   ALT 15 11/03/2012   AST 13 11/03/2012   NA 139 11/03/2012   K 3.8 11/03/2012   CL 105 11/03/2012   CREATININE 1.4* 11/03/2012   BUN 20.8 11/03/2012   CO2 23 11/03/2012   INR 1.10* 11/01/2012   HGBA1C 6.6* 05/03/2012     ASSESSMENT AND PLAN:   1. Hypertension:  on losartan.  2.  Hyperlipidemia: on Crestor.  3. Lytic bone lesions causing bone pain: much improved with radiation. I will continue with monthly Zometa for 2 years total until 03/2014.  4. Mild chronic normocytic anemia: due to myeloma, its treatment, and chronic disease.  There is no active bleeding.  There is no indication for transfusion.  5. History of Afib:  She is on Coumadin per PCP.  6. Moderate calorie/protein malnutrition: From myeloma, and its treatment. This is also improved.  7. Multiple myeloma:  -Status: bone marrow biopsy now showed only 2% plasma cell compared to 92% at diagnosis.  Her serum M-spike and light chains have completely normalized.  She has achieved complete remission.  She was deemed not appropriate for auto BMT due to her age.  - I recommend maintenance Revlimid at 15mg  PO d1-21 every 4 week-cycle and decrease Dexamethasone from 40mg  to 20mg  PO once weekly.  Prolonged Revlimid/Dex has been showed to prolong remission and improve survival in patients with myeloma.  She expressed informed understanding and wish to continue.  After two years, in 09/2014, we will readdress the risk of benefit of this regimen.   8.  Prophylaxis:  She can stop Acyclovir and Bactrim at this time since she will not be on Velcade anymore and she has not had leukopenia with Revlimid.   9. Follow up: in one month for lab and Zometa.  Return visit in about 2 months.

## 2012-11-04 ENCOUNTER — Encounter: Payer: Self-pay | Admitting: *Deleted

## 2012-11-04 NOTE — Progress Notes (Signed)
Fax from [facility] they shipped Revlimid on 2/04 for delivery on 2/05.

## 2012-11-10 LAB — TISSUE HYBRIDIZATION (BONE MARROW)-NCBH

## 2012-11-10 LAB — CHROMOSOME ANALYSIS, BONE MARROW

## 2012-11-20 ENCOUNTER — Other Ambulatory Visit: Payer: Self-pay | Admitting: Family Medicine

## 2012-11-24 ENCOUNTER — Encounter: Payer: Self-pay | Admitting: Nutrition

## 2012-11-24 ENCOUNTER — Telehealth: Payer: Self-pay | Admitting: *Deleted

## 2012-11-24 NOTE — Progress Notes (Signed)
Provided one case of complementary Ensure Plus for patient.

## 2012-11-24 NOTE — Telephone Encounter (Signed)
VM from pt's husband asking to pick up some Ensure at clinic this morning.   Spoke w/ Vernell Leep, RD,  And she will try to contact husband about request for Ensure.

## 2012-11-25 ENCOUNTER — Other Ambulatory Visit: Payer: Self-pay | Admitting: *Deleted

## 2012-11-25 ENCOUNTER — Encounter: Payer: Self-pay | Admitting: Family Medicine

## 2012-11-25 ENCOUNTER — Ambulatory Visit (INDEPENDENT_AMBULATORY_CARE_PROVIDER_SITE_OTHER): Payer: Medicare Other | Admitting: Family Medicine

## 2012-11-25 ENCOUNTER — Other Ambulatory Visit: Payer: Self-pay | Admitting: Family Medicine

## 2012-11-25 VITALS — BP 180/92 | HR 83 | Ht 63.0 in | Wt 153.0 lb

## 2012-11-25 DIAGNOSIS — C9000 Multiple myeloma not having achieved remission: Secondary | ICD-10-CM

## 2012-11-25 DIAGNOSIS — F411 Generalized anxiety disorder: Secondary | ICD-10-CM

## 2012-11-25 DIAGNOSIS — I10 Essential (primary) hypertension: Secondary | ICD-10-CM

## 2012-11-25 DIAGNOSIS — R110 Nausea: Secondary | ICD-10-CM

## 2012-11-25 LAB — CBC WITH DIFFERENTIAL/PLATELET
Eosinophils Absolute: 0.5 10*3/uL (ref 0.0–0.7)
Hemoglobin: 11.9 g/dL — ABNORMAL LOW (ref 12.0–15.0)
Lymphs Abs: 1.6 10*3/uL (ref 0.7–4.0)
MCH: 33.8 pg (ref 26.0–34.0)
MCV: 102 fL — ABNORMAL HIGH (ref 78.0–100.0)
Monocytes Relative: 24 % — ABNORMAL HIGH (ref 3–12)
Neutrophils Relative %: 45 % (ref 43–77)
RBC: 3.52 MIL/uL — ABNORMAL LOW (ref 3.87–5.11)

## 2012-11-25 LAB — COMPLETE METABOLIC PANEL WITH GFR
Albumin: 4.2 g/dL (ref 3.5–5.2)
CO2: 24 mEq/L (ref 19–32)
Calcium: 8.8 mg/dL (ref 8.4–10.5)
Chloride: 104 mEq/L (ref 96–112)
GFR, Est African American: 59 mL/min — ABNORMAL LOW
GFR, Est Non African American: 51 mL/min — ABNORMAL LOW
Glucose, Bld: 102 mg/dL — ABNORMAL HIGH (ref 70–99)
Sodium: 140 mEq/L (ref 135–145)
Total Bilirubin: 1 mg/dL (ref 0.3–1.2)
Total Protein: 6.3 g/dL (ref 6.0–8.3)

## 2012-11-25 MED ORDER — ALPRAZOLAM 1 MG PO TABS: ORAL_TABLET | ORAL | Status: DC

## 2012-11-25 MED ORDER — LENALIDOMIDE 15 MG PO CAPS: 15.0000 mg | ORAL_CAPSULE | Freq: Every day | ORAL | Status: DC

## 2012-11-25 NOTE — Patient Instructions (Signed)
Take a zofran when you get home. If after an hour not feeling better then take 1/2 an alprazolam and see if feels better Recheck your blood pressure at home tomorrow.  Take an extra losartan today to bring down your blood pressure down.

## 2012-11-25 NOTE — Progress Notes (Signed)
Subjective:    Patient ID: [name], female    DOB: 10-Jan-1936, 77 y.o.   MRN: [id number]  HPI pt denies SOB, or any chest pains. she stated that she has been feeling weak, fatgued, and has had a loss of appetite. some nausea but no diarrhea or vomiting. Hasn''t felt well in the last 2 days.  No cough or urinary cough.  Feels shakey.  No sick contacts. Was feeling well and eating well until 2 days ago. Now nauseated. Hasn't tried taking her xanax or zofran.  She says either she or her husband has had a doctor's appointment on this every day this week and has been a little bit stressful for her.  She would really like to get off her coumadin. She take 1/2 of a 4 mg tablet daily.  Review of Systems     Objective:   Physical Exam  Constitutional: She is oriented to person, place, and time. She appears well-developed and well-nourished.  HENT:  Head: Normocephalic and atraumatic.  Right Ear: External ear normal.  Left Ear: External ear normal.  Nose: Nose normal.  Mouth/Throat: Oropharynx is clear and moist.  TMs and canals are clear.   Eyes: Conjunctivae and EOM are normal. Pupils are equal, round, and reactive to light.  Neck: Neck supple. No thyromegaly present.  Cardiovascular: Normal rate, regular rhythm and normal heart sounds.   Pulmonary/Chest: Effort normal and breath sounds normal. She has no wheezes.  Lymphadenopathy:    She has no cervical adenopathy.  Neurological: She is alert and oriented to person, place, and time.  Skin: Skin is warm and dry.  Psychiatric: She has a normal mood and affect. Her behavior is normal.          Assessment & Plan:  Hypertension-uncontrolled today. This is a little high for her. She does seem very anxious the office today. I encouraged her to go home and take a Zofran for the nausea and see if that helps. After our she still doesn't feel well then recommend she take half of a Xanax and see if this calms her nerves. I would like she or  her husband to recheck her blood pressure first thing in the morning and make sure that it's responding and coming down. I did encourage her to take an extra losartan today but only today. I really think that her blood pressure might be anxiety induced today. I hesitate to change her long-term regimen. Certainly please call the office or go to urgent care but we can if she feels she's getting worse or develops new symptoms such as a coal, cough fever et Karie Soda. I would like to check a CMP and make sure that her electrolytes and liver and kidney function are stable as well as a CBC to rule out any sign of acute infection.  Anxiety-she clearly seems anxious today. I would like her to try her Zofran first and then maybe a half a tab of Xanax she still not feeling well. I really want her to go home and rest. It sounds like this has been a stressful week for her.  As far as her Coumadin is concerned per my record she just had a transient episode of A. fib. I Do not think she has intermittent paroxysmal A. fib as far as I am aware. I will try to get in touch with her oncologist to see if he wants her to continue prophylaxis because of her cancer which as per my records why the  medication was initially started. Or he may have further reasons or possible evidence of paroxysmal A. fib, why she needs to continue the medication. I will investigate this and get back with her same.

## 2012-11-25 NOTE — Telephone Encounter (Signed)
THIS REFILL REQUEST FOR REVLIMID WAS PLACED IN DR.HA'S ACTIVE WORK FOLDER. 

## 2012-11-27 ENCOUNTER — Emergency Department (HOSPITAL_COMMUNITY)
Admission: EM | Admit: 2012-11-27 | Discharge: 2012-11-27 | Disposition: A | Discharge status: Home / Self Care | Payer: Medicare Other | Attending: Emergency Medicine | Admitting: Emergency Medicine

## 2012-11-27 ENCOUNTER — Encounter (HOSPITAL_COMMUNITY): Payer: Self-pay | Admitting: Nurse Practitioner

## 2012-11-27 DIAGNOSIS — R531 Weakness: Secondary | ICD-10-CM

## 2012-11-27 DIAGNOSIS — I4891 Unspecified atrial fibrillation: Secondary | ICD-10-CM | POA: Insufficient documentation

## 2012-11-27 DIAGNOSIS — F411 Generalized anxiety disorder: Secondary | ICD-10-CM | POA: Insufficient documentation

## 2012-11-27 DIAGNOSIS — Z9221 Personal history of antineoplastic chemotherapy: Secondary | ICD-10-CM | POA: Insufficient documentation

## 2012-11-27 DIAGNOSIS — Z79899 Other long term (current) drug therapy: Secondary | ICD-10-CM | POA: Insufficient documentation

## 2012-11-27 DIAGNOSIS — F329 Major depressive disorder, single episode, unspecified: Secondary | ICD-10-CM | POA: Insufficient documentation

## 2012-11-27 DIAGNOSIS — E785 Hyperlipidemia, unspecified: Secondary | ICD-10-CM | POA: Insufficient documentation

## 2012-11-27 DIAGNOSIS — Z923 Personal history of irradiation: Secondary | ICD-10-CM | POA: Insufficient documentation

## 2012-11-27 DIAGNOSIS — C9000 Multiple myeloma not having achieved remission: Secondary | ICD-10-CM | POA: Insufficient documentation

## 2012-11-27 DIAGNOSIS — R5381 Other malaise: Secondary | ICD-10-CM | POA: Insufficient documentation

## 2012-11-27 DIAGNOSIS — F3289 Other specified depressive episodes: Secondary | ICD-10-CM | POA: Insufficient documentation

## 2012-11-27 DIAGNOSIS — Z87448 Personal history of other diseases of urinary system: Secondary | ICD-10-CM | POA: Insufficient documentation

## 2012-11-27 DIAGNOSIS — M129 Arthropathy, unspecified: Secondary | ICD-10-CM | POA: Insufficient documentation

## 2012-11-27 DIAGNOSIS — Z8739 Personal history of other diseases of the musculoskeletal system and connective tissue: Secondary | ICD-10-CM | POA: Insufficient documentation

## 2012-11-27 DIAGNOSIS — Z7982 Long term (current) use of aspirin: Secondary | ICD-10-CM | POA: Insufficient documentation

## 2012-11-27 DIAGNOSIS — I10 Essential (primary) hypertension: Secondary | ICD-10-CM | POA: Insufficient documentation

## 2012-11-27 DIAGNOSIS — Z8679 Personal history of other diseases of the circulatory system: Secondary | ICD-10-CM | POA: Insufficient documentation

## 2012-11-27 LAB — URINALYSIS, ROUTINE W REFLEX MICROSCOPIC
Bilirubin Urine: NEGATIVE
Glucose, UA: NEGATIVE mg/dL
Hgb urine dipstick: NEGATIVE
Ketones, ur: 15 mg/dL — AB
Nitrite: NEGATIVE
Protein, ur: 30 mg/dL — AB
Specific Gravity, Urine: 1.029 (ref 1.005–1.030)
Urobilinogen, UA: 0.2 mg/dL (ref 0.0–1.0)
pH: 5.5 (ref 5.0–8.0)

## 2012-11-27 LAB — CBC
HCT: 33.6 % — ABNORMAL LOW (ref 36.0–46.0)
Hemoglobin: 11.2 g/dL — ABNORMAL LOW (ref 12.0–15.0)
MCH: 33.2 pg (ref 26.0–34.0)
MCHC: 33.3 g/dL (ref 30.0–36.0)
MCV: 99.7 fL (ref 78.0–100.0)
Platelets: 164 10*3/uL (ref 150–400)
RBC: 3.37 MIL/uL — ABNORMAL LOW (ref 3.87–5.11)
RDW: 13.2 % (ref 11.5–15.5)
WBC: 5.2 10*3/uL (ref 4.0–10.5)

## 2012-11-27 LAB — BASIC METABOLIC PANEL
BUN: 15 mg/dL (ref 6–23)
CO2: 26 mEq/L (ref 19–32)
Calcium: 9.6 mg/dL (ref 8.4–10.5)
Chloride: 102 mEq/L (ref 96–112)
Creatinine, Ser: 1.04 mg/dL (ref 0.50–1.10)
GFR calc Af Amer: 59 mL/min — ABNORMAL LOW (ref >90)
GFR calc non Af Amer: 51 mL/min — ABNORMAL LOW (ref >90)
Glucose, Bld: 99 mg/dL (ref 70–99)
Potassium: 3.6 mEq/L (ref 3.5–5.1)
Sodium: 139 mEq/L (ref 135–145)

## 2012-11-27 LAB — URINE MICROSCOPIC-ADD ON

## 2012-11-27 NOTE — ED Notes (Addendum)
Pt is being treated at [facility] for bone cancer now. Reports since Friday she has felt generalized weakness more than normal. Denies any unilateral weakness, speech is clear, no facial droop or arm drift. Able to ambulate independenlty with walker. A&Ox4. Also reports decreased appetite. Had labs drawn on Friday at PCP office

## 2012-11-28 LAB — FOLATE: Folate: >20 ng/mL

## 2012-11-28 LAB — VITAMIN B12: Vitamin B-12: 547 pg/mL (ref 211–911)

## 2012-11-30 ENCOUNTER — Other Ambulatory Visit: Payer: Self-pay | Admitting: *Deleted

## 2012-11-30 NOTE — ED Provider Notes (Signed)
History    77 year old female with generalized weakness. Gradual onset about Friday and has been relatively constant since. Patient feels fatigued. She denies any focal weakness. No fevers or chills. No shortness of breath. No urinary complaints. Decreased appetite. Denies any acute trauma. No BRBPR or melena.  CSN: [id number]  Arrival date & time 11/27/12  1027   First MD Initiated Contact with Patient 11/27/12 1220      Chief Complaint  Patient presents with  . Weakness    (Consider location/radiation/quality/duration/timing/severity/associated sxs/prior treatment) HPI  Past Medical History  Diagnosis Date  . Anxiety   . Hypertension   . Hyperlipidemia   . Depression   . Osteopenia   . Multiple myeloma 03/2012  . Acute renal failure      due to hypercalcemia and suspected multiple myeloma resolved =with IVF's    . Lytic bone lesions on xray   . Acute subdural hematoma 03/2012     recent fall in hospital stay,resolved+  . Normocytic anemia     due to chemo and multiple myeloma,no active bleding  . Cancer     mul;tiple myeloma, bone lesions r 7th rib,   . Arthritis     spine  . Atrial fibrillation   . Radiation 05/09/12-05/18/12    20 gray in 8 Fx's right hip palliative  . Status post chemotherapy     velcade and revlimid    Past Surgical History  Procedure Laterality Date  . Breast lumpectomy    . 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 Tonsillectomy      age 32    Family History  Problem Relation Age of Onset  . Heart disease Mother   . Hyperlipidemia Sister   . Hypertension Sister   . Diabetes Sister   . Diabetes Sister   . Hyperlipidemia Sister   . Hypertension Sister   . Sudden death Neg Hx   . Heart attack Neg Hx     History  Substance Use Topics  . Smoking status: Never Smoker   . Smokeless tobacco: Never Used  . Alcohol Use: No    OB History   Grav Para Term Preterm Abortions TAB SAB Ect Mult Living                  Review of Systems  All systems reviewed and negative, other than as noted in HPI.   Allergies  Simvastatin  Home Medications   Current Outpatient Rx  Name  Route  Sig  Dispense  Refill  . acetaminophen (TYLENOL) 500 MG tablet   Oral   Take 500-1,000 mg by mouth every 4 (four) hours as needed. No more than 6 tabs in one day.         . ALPRAZolam (XANAX) 1 MG tablet   Oral   Take 0.5-1 mg by mouth as needed for sleep or anxiety.         Marland Kitchen dexamethasone (DECADRON) 4 MG tablet   Oral   Take 40 mg by mouth every Tuesday. with chemotherapy drug         . fentaNYL (DURAGESIC - DOSED MCG/HR) 25 MCG/HR   Transdermal   Place 1 patch onto the skin every 3 (three) days. As directed         . lenalidomide (REVLIMID) 15 MG capsule   Oral   Take 15 mg by mouth See admin instructions. Takes for 21 days and then  stops for 7 days. Took last dose 11-26-12. Won't start again until 12-04-12         . losartan (COZAAR) 50 MG tablet   Oral   Take 50 mg by mouth daily.         . Multiple Vitamin (MULTIVITAMIN WITH MINERALS) TABS   Oral   Take 1 tablet by mouth daily.         . ondansetron (ZOFRAN) 4 MG tablet   Oral   Take 4 mg by mouth every 4 (four) hours as needed for nausea. Take 1 tablet every 8 hours as needed for nausea         . potassium chloride SA (K-DUR,KLOR-CON) 20 MEQ tablet   Oral   Take 20 mEq by mouth daily.         . prochlorperazine (COMPAZINE) 10 MG tablet   Oral   Take 10 mg by mouth every 6 (six) hours as needed. Take 1 tablet every 6 hours as directed         . rosuvastatin (CRESTOR) 10 MG tablet   Oral   Take 10 mg by mouth daily.         Marland Kitchen warfarin (COUMADIN) 4 MG tablet   Oral   Take 2 mg by mouth daily. Coumadin 2 mg daily           BP 134/88  Pulse 71  Temp(Src) 98.5 F (36.9 C) (Oral)  Resp 22  SpO2 98%  Physical Exam  Nursing note and vitals  reviewed. Constitutional: She is oriented to person, place, and time. She appears well-developed and well-nourished. No distress.  HENT:  Head: Normocephalic and atraumatic.  Eyes: Conjunctivae are normal. Right eye exhibits no discharge. Left eye exhibits no discharge.  Neck: Neck supple.  Cardiovascular: Normal rate, regular rhythm and normal heart sounds.  Exam reveals no gallop and no friction rub.   No murmur heard. Pulmonary/Chest: Effort normal and breath sounds normal. No respiratory distress.  Abdominal: Soft. She exhibits no distension. There is no tenderness.  Musculoskeletal: She exhibits no edema and no tenderness.  Neurological: She is alert and oriented to person, place, and time. No cranial nerve deficit. She exhibits normal muscle tone. Coordination normal.  Speech is clear. Content is appropriate. Good finger nose testing bilaterally.  Skin: Skin is warm and dry.  Psychiatric: She has a normal mood and affect. Her behavior is normal. Thought content normal.    ED Course  Procedures (including critical care time)  Labs Reviewed  CBC - Abnormal; Notable for the following:    RBC 3.37 (*)    Hemoglobin 11.2 (*)    HCT 33.6 (*)    All other components within normal limits  BASIC METABOLIC PANEL - Abnormal; Notable for the following:    GFR calc non Af Amer 51 (*)    GFR calc Af Amer 59 (*)    All other components within normal limits  URINALYSIS, ROUTINE W REFLEX MICROSCOPIC - Abnormal; Notable for the following:    Ketones, ur 15 (*)    Protein, ur 30 (*)    Leukocytes, UA SMALL (*)    All other components within normal limits  URINE MICROSCOPIC-ADD ON   No results found.  EKG:  Rhythm: normal sinus Vent. rate 72 BPM PR interval 156 ms QRS duration 92 ms QT/QTc 412/451 ms ST segments: NS ST changes   1. Generalized weakness       MDM  77 year old female with generalized weakness. Very  low suspicion for emergent etiology of her complaints. Patient  is afebrile and hemodynamically stable. She is well appearing on exam. Basic screening labs obtained which are fairly unremarkable. Patient is mildly anemic,but she is actually higher than her baseline today.         Raeford Razor, MD 12/01/12 [phone]

## 2012-12-01 ENCOUNTER — Other Ambulatory Visit: Payer: Self-pay | Admitting: *Deleted

## 2012-12-01 ENCOUNTER — Ambulatory Visit (HOSPITAL_BASED_OUTPATIENT_CLINIC_OR_DEPARTMENT_OTHER): Payer: Medicare Other

## 2012-12-01 ENCOUNTER — Encounter (HOSPITAL_BASED_OUTPATIENT_CLINIC_OR_DEPARTMENT_OTHER): Payer: Medicare Other | Attending: Internal Medicine

## 2012-12-01 ENCOUNTER — Other Ambulatory Visit (HOSPITAL_BASED_OUTPATIENT_CLINIC_OR_DEPARTMENT_OTHER): Payer: Medicare Other | Admitting: Lab

## 2012-12-01 VITALS — BP 130/82 | HR 67 | Temp 98.0°F | Resp 18

## 2012-12-01 DIAGNOSIS — K5901 Slow transit constipation: Secondary | ICD-10-CM

## 2012-12-01 DIAGNOSIS — I10 Essential (primary) hypertension: Secondary | ICD-10-CM

## 2012-12-01 DIAGNOSIS — M545 Low back pain, unspecified: Secondary | ICD-10-CM

## 2012-12-01 DIAGNOSIS — E785 Hyperlipidemia, unspecified: Secondary | ICD-10-CM

## 2012-12-01 DIAGNOSIS — S065X9A Traumatic subdural hemorrhage with loss of consciousness of unspecified duration, initial encounter: Secondary | ICD-10-CM

## 2012-12-01 DIAGNOSIS — E669 Obesity, unspecified: Secondary | ICD-10-CM

## 2012-12-01 DIAGNOSIS — M898X9 Other specified disorders of bone, unspecified site: Secondary | ICD-10-CM

## 2012-12-01 DIAGNOSIS — F3289 Other specified depressive episodes: Secondary | ICD-10-CM

## 2012-12-01 DIAGNOSIS — M25559 Pain in unspecified hip: Secondary | ICD-10-CM

## 2012-12-01 DIAGNOSIS — N179 Acute kidney failure, unspecified: Secondary | ICD-10-CM

## 2012-12-01 DIAGNOSIS — L8993 Pressure ulcer of unspecified site, stage 3: Secondary | ICD-10-CM | POA: Insufficient documentation

## 2012-12-01 DIAGNOSIS — Z780 Asymptomatic menopausal state: Secondary | ICD-10-CM

## 2012-12-01 DIAGNOSIS — C9000 Multiple myeloma not having achieved remission: Secondary | ICD-10-CM

## 2012-12-01 DIAGNOSIS — N63 Unspecified lump in unspecified breast: Secondary | ICD-10-CM

## 2012-12-01 DIAGNOSIS — S065XAA Traumatic subdural hemorrhage with loss of consciousness status unknown, initial encounter: Secondary | ICD-10-CM

## 2012-12-01 DIAGNOSIS — L89109 Pressure ulcer of unspecified part of back, unspecified stage: Secondary | ICD-10-CM | POA: Insufficient documentation

## 2012-12-01 DIAGNOSIS — D649 Anemia, unspecified: Secondary | ICD-10-CM

## 2012-12-01 DIAGNOSIS — M949 Disorder of cartilage, unspecified: Secondary | ICD-10-CM

## 2012-12-01 DIAGNOSIS — F329 Major depressive disorder, single episode, unspecified: Secondary | ICD-10-CM

## 2012-12-01 DIAGNOSIS — R7989 Other specified abnormal findings of blood chemistry: Secondary | ICD-10-CM

## 2012-12-01 DIAGNOSIS — E860 Dehydration: Secondary | ICD-10-CM

## 2012-12-01 DIAGNOSIS — C9030 Solitary plasmacytoma not having achieved remission: Secondary | ICD-10-CM

## 2012-12-01 DIAGNOSIS — M899 Disorder of bone, unspecified: Secondary | ICD-10-CM

## 2012-12-01 DIAGNOSIS — F411 Generalized anxiety disorder: Secondary | ICD-10-CM

## 2012-12-01 DIAGNOSIS — G4700 Insomnia, unspecified: Secondary | ICD-10-CM

## 2012-12-01 LAB — BASIC METABOLIC PANEL (CC13)
CO2: 27 mEq/L (ref 22–29)
Calcium: 9.5 mg/dL (ref 8.4–10.4)
Chloride: 107 mEq/L (ref 98–107)
Sodium: 142 mEq/L (ref 136–145)

## 2012-12-01 LAB — CBC WITH DIFFERENTIAL/PLATELET
Eosinophils Absolute: 0.2 10*3/uL (ref 0.0–0.5)
MONO#: 0.6 10*3/uL (ref 0.1–0.9)
MONO%: 12.4 % (ref 0.0–14.0)
NEUT#: 2.7 10*3/uL (ref 1.5–6.5)
RBC: 3.29 10*6/uL — ABNORMAL LOW (ref 3.70–5.45)
RDW: 13.3 % (ref 11.2–14.5)
WBC: 5 10*3/uL (ref 3.9–10.3)
lymph#: 1.4 10*3/uL (ref 0.9–3.3)
nRBC: 0 % (ref 0–0)

## 2012-12-01 MED ORDER — ZOLEDRONIC ACID 4 MG/100ML IV SOLN
4.0000 mg | Freq: Once | INTRAVENOUS | Status: AC
  Administered 2012-12-01: 4 mg via INTRAVENOUS
  Filled 2012-12-01: qty 100

## 2012-12-01 MED ORDER — FENTANYL 12 MCG/HR TD PT72: 1.0000 | MEDICATED_PATCH | TRANSDERMAL | Status: DC

## 2012-12-01 MED ORDER — SODIUM CHLORIDE 0.9 % IV SOLN
Freq: Once | INTRAVENOUS | Status: AC
  Administered 2012-12-01: 14:00:00 via INTRAVENOUS

## 2012-12-01 NOTE — Patient Instructions (Addendum)
Zoledronic Acid injection (Hypercalcemia, Oncology) What is this medicine? ZOLEDRONIC ACID (ZOE le dron ik AS id) lowers the amount of calcium loss from bone. It is used to treat too much calcium in your blood from cancer. It is also used to prevent complications of cancer that has spread to the bone. This medicine may be used for other purposes; ask your health care provider or pharmacist if you have questions. What should I tell my health care provider before I take this medicine? They need to know if you have any of these conditions: -aspirin-sensitive asthma -dental disease -kidney disease -an unusual or allergic reaction to zoledronic acid, other medicines, foods, dyes, or preservatives -pregnant or trying to get pregnant -breast-feeding How should I use this medicine? This medicine is for infusion into a vein. It is given by a health care professional in a hospital or clinic setting. Talk to your pediatrician regarding the use of this medicine in children. Special care may be needed. Overdosage: If you think you have taken too much of this medicine contact a poison control center or emergency room at once. NOTE: This medicine is only for you. Do not share this medicine with others. What if I miss a dose? It is important not to miss your dose. Call your doctor or health care professional if you are unable to keep an appointment. What may interact with this medicine? -certain antibiotics given by injection -NSAIDs, medicines for pain and inflammation, like ibuprofen or naproxen -some diuretics like bumetanide, furosemide -teriparatide -thalidomide This list may not describe all possible interactions. Give your health care provider a list of all the medicines, herbs, non-prescription drugs, or dietary supplements you use. Also tell them if you smoke, drink alcohol, or use illegal drugs. Some items may interact with your medicine. What should I watch for while using this medicine? Visit  your doctor or health care professional for regular checkups. It may be some time before you see the benefit from this medicine. Do not stop taking your medicine unless your doctor tells you to. Your doctor may order blood tests or other tests to see how you are doing. Women should inform their doctor if they wish to become pregnant or think they might be pregnant. There is a potential for serious side effects to an unborn child. Talk to your health care professional or pharmacist for more information. You should make sure that you get enough calcium and vitamin D while you are taking this medicine. Discuss the foods you eat and the vitamins you take with your health care professional. Some people who take this medicine have severe bone, joint, and/or muscle pain. This medicine may also increase your risk for a broken thigh bone. Tell your doctor right away if you have pain in your upper leg or groin. Tell your doctor if you have any pain that does not go away or that gets worse. What side effects may I notice from receiving this medicine? Side effects that you should report to your doctor or health care professional as soon as possible: -allergic reactions like skin rash, itching or hives, swelling of the face, lips, or tongue -anxiety, confusion, or depression -breathing problems -changes in vision -feeling faint or lightheaded, falls -jaw burning, cramping, pain -muscle cramps, stiffness, or weakness -trouble passing urine or change in the amount of urine Side effects that usually do not require medical attention (report to your doctor or health care professional if they continue or are bothersome): -bone, joint, or muscle pain -  fever -hair loss -irritation at site where injected -loss of appetite -nausea, vomiting -stomach upset -tired This list may not describe all possible side effects. Call your doctor for medical advice about side effects. You may report side effects to FDA at  1-800-FDA-1088. Where should I keep my medicine? This drug is given in a hospital or clinic and will not be stored at home. NOTE: This sheet is a summary. It may not cover all possible information. If you have questions about this medicine, talk to your doctor, pharmacist, or health care provider.  2013, [employer]. (03/13/2011 9:06:58 AM)  

## 2012-12-01 NOTE — Telephone Encounter (Signed)
Rx for fentanyl patch 12 mcg given to husband in lobby.  Informed dose has been decreased from 25 mcg to 12 mcg by Dr. Gaylyn Rong in hopes of tapering pt off patches altogether in the future.  He verbalized understanding.

## 2012-12-05 ENCOUNTER — Telehealth: Payer: Self-pay | Admitting: Family Medicine

## 2012-12-05 NOTE — Telephone Encounter (Signed)
Patient advised.

## 2012-12-05 NOTE — Telephone Encounter (Signed)
Call pt:  She can come off the coumadin.  Per Dr. Gaylyn Rong she was on it because of her chemo initially but she is not in a-fib so doesn't need it long term She can stop it.

## 2012-12-05 NOTE — Telephone Encounter (Signed)
LM for pt to return call. Kimberly Gordon, LPN  

## 2012-12-23 ENCOUNTER — Other Ambulatory Visit: Payer: Self-pay | Admitting: *Deleted

## 2012-12-23 MED ORDER — LENALIDOMIDE 15 MG PO CAPS: 15.0000 mg | ORAL_CAPSULE | ORAL | Status: DC

## 2012-12-23 NOTE — Telephone Encounter (Signed)
THIS REFILL REQUEST FOR REVLIMID WAS PLACED IN DR.HA'S ACTIVE WORK FOLDER. 

## 2012-12-26 NOTE — Telephone Encounter (Signed)
RECEIVED A FAX FROM [facility] CONCERNING A CONFIRMATION OF FACSIMILE RECEIPT FOR PT. REFERRAL. 

## 2012-12-27 ENCOUNTER — Encounter: Payer: Self-pay | Admitting: *Deleted

## 2012-12-27 NOTE — Progress Notes (Signed)
Fax from [facility], they shipped pt's Revlimid on 12/26/12 for delivery today.

## 2012-12-29 ENCOUNTER — Ambulatory Visit (HOSPITAL_BASED_OUTPATIENT_CLINIC_OR_DEPARTMENT_OTHER): Payer: Medicare Other | Admitting: Oncology

## 2012-12-29 ENCOUNTER — Ambulatory Visit (HOSPITAL_BASED_OUTPATIENT_CLINIC_OR_DEPARTMENT_OTHER): Payer: Medicare Other

## 2012-12-29 ENCOUNTER — Telehealth: Payer: Self-pay | Admitting: Oncology

## 2012-12-29 ENCOUNTER — Other Ambulatory Visit (HOSPITAL_BASED_OUTPATIENT_CLINIC_OR_DEPARTMENT_OTHER): Payer: Medicare Other

## 2012-12-29 VITALS — BP 167/98 | HR 81 | Temp 98.2°F | Resp 18 | Ht 63.0 in | Wt 153.6 lb

## 2012-12-29 DIAGNOSIS — C9000 Multiple myeloma not having achieved remission: Secondary | ICD-10-CM

## 2012-12-29 DIAGNOSIS — M545 Low back pain, unspecified: Secondary | ICD-10-CM

## 2012-12-29 DIAGNOSIS — K5901 Slow transit constipation: Secondary | ICD-10-CM

## 2012-12-29 DIAGNOSIS — Z780 Asymptomatic menopausal state: Secondary | ICD-10-CM

## 2012-12-29 DIAGNOSIS — C9030 Solitary plasmacytoma not having achieved remission: Secondary | ICD-10-CM

## 2012-12-29 DIAGNOSIS — N63 Unspecified lump in unspecified breast: Secondary | ICD-10-CM

## 2012-12-29 DIAGNOSIS — E860 Dehydration: Secondary | ICD-10-CM

## 2012-12-29 DIAGNOSIS — R7989 Other specified abnormal findings of blood chemistry: Secondary | ICD-10-CM

## 2012-12-29 DIAGNOSIS — I10 Essential (primary) hypertension: Secondary | ICD-10-CM

## 2012-12-29 DIAGNOSIS — E669 Obesity, unspecified: Secondary | ICD-10-CM

## 2012-12-29 DIAGNOSIS — G4700 Insomnia, unspecified: Secondary | ICD-10-CM

## 2012-12-29 DIAGNOSIS — S065X9A Traumatic subdural hemorrhage with loss of consciousness of unspecified duration, initial encounter: Secondary | ICD-10-CM

## 2012-12-29 DIAGNOSIS — F3289 Other specified depressive episodes: Secondary | ICD-10-CM

## 2012-12-29 DIAGNOSIS — F411 Generalized anxiety disorder: Secondary | ICD-10-CM

## 2012-12-29 DIAGNOSIS — M25559 Pain in unspecified hip: Secondary | ICD-10-CM

## 2012-12-29 DIAGNOSIS — E785 Hyperlipidemia, unspecified: Secondary | ICD-10-CM

## 2012-12-29 DIAGNOSIS — F329 Major depressive disorder, single episode, unspecified: Secondary | ICD-10-CM

## 2012-12-29 DIAGNOSIS — S065XAA Traumatic subdural hemorrhage with loss of consciousness status unknown, initial encounter: Secondary | ICD-10-CM

## 2012-12-29 DIAGNOSIS — M898X9 Other specified disorders of bone, unspecified site: Secondary | ICD-10-CM

## 2012-12-29 DIAGNOSIS — D649 Anemia, unspecified: Secondary | ICD-10-CM

## 2012-12-29 DIAGNOSIS — M899 Disorder of bone, unspecified: Secondary | ICD-10-CM

## 2012-12-29 DIAGNOSIS — N179 Acute kidney failure, unspecified: Secondary | ICD-10-CM

## 2012-12-29 LAB — COMPREHENSIVE METABOLIC PANEL (CC13)
ALT: 16 U/L (ref 0–55)
CO2: 25 mEq/L (ref 22–29)
Calcium: 9.9 mg/dL (ref 8.4–10.4)
Chloride: 106 mEq/L (ref 98–107)
Potassium: 3.8 mEq/L (ref 3.5–5.1)
Sodium: 140 mEq/L (ref 136–145)
Total Protein: 6.6 g/dL (ref 6.4–8.3)

## 2012-12-29 LAB — CBC WITH DIFFERENTIAL/PLATELET
Basophils Absolute: 0.1 10*3/uL (ref 0.0–0.1)
EOS%: 4.2 % (ref 0.0–7.0)
HCT: 34.2 % — ABNORMAL LOW (ref 34.8–46.6)
HGB: 11.3 g/dL — ABNORMAL LOW (ref 11.6–15.9)
MCH: 32.2 pg (ref 25.1–34.0)
NEUT%: 47.1 % (ref 38.4–76.8)
lymph#: 1.8 10*3/uL (ref 0.9–3.3)

## 2012-12-29 MED ORDER — ZOLEDRONIC ACID 4 MG/100ML IV SOLN
4.0000 mg | Freq: Once | INTRAVENOUS | Status: AC
  Administered 2012-12-29: 4 mg via INTRAVENOUS
  Filled 2012-12-29: qty 100

## 2012-12-29 MED ORDER — SODIUM CHLORIDE 0.9 % IV SOLN
INTRAVENOUS | Status: DC
  Administered 2012-12-29: 16:00:00 via INTRAVENOUS

## 2012-12-29 NOTE — Progress Notes (Signed)
Discharged ambulatory with cane, with spouse in no distress

## 2012-12-29 NOTE — Telephone Encounter (Signed)
, °

## 2012-12-29 NOTE — Progress Notes (Signed)
[facility]  [phone] Fax:([phone]) [phone]   OFFICE PROGRESS NOTE   Cc:  METHENEY,CATHERINE, MD  DIAGNOSIS: history of IgA kappa multiple myeloma. She presented with diffuse lytic bone lesions, renal insufficiency, hypercalcemia, anemia. Her M-spike was 4.06 gm/dL. Her IgA was elevated at 4,020 mg/dL; IgG 353; IgM 5; serum kappa was 2.11; lambda 0.60; serum kappa to lambda ratio elevated at 3.06. Spot urine showed elevated kappa:lambda at 579; and positive for Bence Jones protein. Bone marrow biopsy on 04/08/2012 showed 92% plasma cell. Cytogenetics showed multiple chromosomal abnormalities; additional chromosomal material on chromosome 8p; 14q; loss of chromosome 8 and 13. 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   PAST THERAPY: Started on SQ Velcade 1.3mg /m2 on 04/12/2012; weekly; 3 weeks on; 1 week off. She started on Revlimid 15mg  PO daily d1-21 on 04/21/2012. She is also on Dexamethasone 40mg  PO once weekly. She also received palliative radiation 20 Gy in 8 fractions to the bilateral hips.   CURRENT THERAPY: started maintenance Revlimid 15mg  PO daily d1-21 every 4 weeks in 10/2012.  She is continued on monthly Zometa.    INTERVAL HISTORY: [name] 77 y.o. female returns for regular follow up with her husband for regular follow up.  She is doing well.  She is able to perform all activities of daily living.  She is able to grocery shop and cook at home.  She is taking Revlimid without problem.  She has intermittent bone pain in the hip which does not bother her or require pain med.  Patient denies fever, anorexia, weight loss, fatigue, headache, visual changes, confusion, drenching night sweats, palpable lymph node swelling, mucositis, odynophagia, dysphagia, nausea vomiting, jaundice, chest pain, palpitation, shortness of breath, dyspnea on exertion, productive cough, gum bleeding, epistaxis, hematemesis, hemoptysis, abdominal pain, abdominal  swelling, early satiety, melena, hematochezia, hematuria, skin rash, spontaneous bleeding, joint swelling, joint pain, heat or cold intolerance, bowel bladder incontinence, focal motor weakness, paresthesia, depression.       Past Medical History  Diagnosis Date  . Anxiety   . Hypertension   . Hyperlipidemia   . Depression   . Osteopenia   . Multiple myeloma 03/2012  . Acute renal failure      due to hypercalcemia and suspected multiple myeloma resolved =with IVF's    . Lytic bone lesions on xray   . Acute subdural hematoma 03/2012     recent fall in hospital stay,resolved+  . Normocytic anemia     due to chemo and multiple myeloma,no active bleding  . Cancer     mul;tiple myeloma, bone lesions r 7th rib,   . Arthritis     spine  . Atrial fibrillation   . Radiation 05/09/12-05/18/12    20 gray in 8 Fx's right hip palliative  . Status post chemotherapy     velcade and revlimid    Past Surgical History  Procedure Laterality Date  . Breast lumpectomy    . 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 Tonsillectomy      age 56    Current Outpatient Prescriptions  Medication Sig Dispense Refill  . acetaminophen (TYLENOL) 500 MG tablet Take 500-1,000 mg by mouth every 4 (four) hours as needed. No more than 6 tabs in one day.      . ALPRAZolam (XANAX) 1 MG tablet Take 0.5-1 mg by mouth as needed for sleep or anxiety.      Marland Kitchen  dexamethasone (DECADRON) 4 MG tablet Take 40 mg by mouth every Tuesday. with chemotherapy drug      . fentaNYL (DURAGESIC - DOSED MCG/HR) 12 MCG/HR Place 1 patch (12.5 mcg total) onto the skin every 3 (three) days.  10 patch  0  . lenalidomide (REVLIMID) 15 MG capsule Take 1 capsule (15 mg total) by mouth See admin instructions. Daily for 21 days, then 7 days off  21 capsule  0  . losartan (COZAAR) 50 MG tablet Take 50 mg by mouth daily.      . Multiple Vitamin  (MULTIVITAMIN WITH MINERALS) TABS Take 1 tablet by mouth daily.      . ondansetron (ZOFRAN) 4 MG tablet Take 4 mg by mouth every 4 (four) hours as needed for nausea. Take 1 tablet every 8 hours as needed for nausea      . potassium chloride SA (K-DUR,KLOR-CON) 20 MEQ tablet Take 20 mEq by mouth daily.      . prochlorperazine (COMPAZINE) 10 MG tablet Take 10 mg by mouth every 6 (six) hours as needed. Take 1 tablet every 6 hours as directed      . rosuvastatin (CRESTOR) 10 MG tablet Take 10 mg by mouth daily.       No current facility-administered medications for this visit.   Facility-Administered Medications Ordered in Other Visits  Medication Dose Route Frequency Provider Last Rate Last Dose  . 0.9 %  sodium chloride infusion   Intravenous Continuous Exie Parody, MD        ALLERGIES:  is allergic to simvastatin.  REVIEW OF SYSTEMS:  The rest of the 14-point review of system was negative.   Filed Vitals:   12/29/12 1311  BP: 167/98  Pulse: 81  Temp: 98.2 F (36.8 C)  Resp: 18   Wt Readings from Last 3 Encounters:  12/29/12 153 lb 9.6 oz (69.673 kg)  11/25/12 153 lb (69.4 kg)  11/03/12 158 lb 6.4 oz (71.85 kg)   ECOG Performance status: 1  PHYSICAL EXAMINATION:   General:  well-nourished woman, in no acute distress.  Eyes:  no scleral icterus.  ENT:  There were no oropharyngeal lesions.  Neck was without thyromegaly.  Lymphatics:  Negative cervical, supraclavicular or axillary adenopathy.  Respiratory: lungs were clear bilaterally without wheezing or crackles.  Cardiovascular:  Regular rate and rhythm, S1/S2, without murmur, rub or gallop.  There was no pedal edema.  GI:  abdomen was soft, flat, nontender, nondistended, without organomegaly.  Muscoloskeletal:  no spinal tenderness of palpation of vertebral spine.  Skin exam was without echymosis, petichae.  Neuro exam was nonfocal.  Patient was able to get on and off exam table without assistance.  Gait was slow using a walker.   Patient was alerted and oriented.  Attention was good.   Language was appropriate.  Mood was normal without depression.  Speech was not pressured.  Thought content was not tangential.     LABORATORY/RADIOLOGY DATA:  Lab Results  Component Value Date   WBC 5.0 12/29/2012   HGB 11.3* 12/29/2012   HCT 34.2* 12/29/2012   PLT 150 12/29/2012   GLUCOSE 86 12/29/2012   CHOL 255* 03/03/2012   TRIG 117 03/03/2012   HDL 62 03/03/2012   LDLDIRECT 166* 04/24/2011   LDLCALC 170* 03/03/2012   ALKPHOS 45 12/29/2012   ALT 16 12/29/2012   AST 19 12/29/2012   NA 140 12/29/2012   K 3.8 12/29/2012   CL 106 12/29/2012   CREATININE 1.0 12/29/2012  BUN 14.8 12/29/2012   CO2 25 12/29/2012   INR 1.10* 11/01/2012   HGBA1C 6.6* 05/03/2012     ASSESSMENT AND PLAN:   1. Hypertension:  on losartan.  2. Hyperlipidemia: on Crestor.  3. Lytic bone lesions causing bone pain: much improved with radiation. I will continue with monthly Zometa for 2 years total until 03/2014.  4. Mild chronic normocytic anemia: due to myeloma, its treatment.  This is stable.   5. Multiple myeloma:  -She is doing well on maintenance Revlimid without side effect.  I advised her to continue this for now.  6. Follow up: in one month for lab and Zometa.  Return visit in about 2 months.

## 2012-12-29 NOTE — Patient Instructions (Signed)
Zoledronic Acid injection (Hypercalcemia, Oncology) What is this medicine? ZOLEDRONIC ACID (ZOE le dron ik AS id) lowers the amount of calcium loss from bone. It is used to treat too much calcium in your blood from cancer. It is also used to prevent complications of cancer that has spread to the bone. This medicine may be used for other purposes; ask your health care provider or pharmacist if you have questions. What should I tell my health care provider before I take this medicine? They need to know if you have any of these conditions: -aspirin-sensitive asthma -dental disease -kidney disease -an unusual or allergic reaction to zoledronic acid, other medicines, foods, dyes, or preservatives -pregnant or trying to get pregnant -breast-feeding How should I use this medicine? This medicine is for infusion into a vein. It is given by a health care professional in a hospital or clinic setting. Talk to your pediatrician regarding the use of this medicine in children. Special care may be needed. Overdosage: If you think you have taken too much of this medicine contact a poison control center or emergency room at once. NOTE: This medicine is only for you. Do not share this medicine with others. What if I miss a dose? It is important not to miss your dose. Call your doctor or health care professional if you are unable to keep an appointment. What may interact with this medicine? -certain antibiotics given by injection -NSAIDs, medicines for pain and inflammation, like ibuprofen or naproxen -some diuretics like bumetanide, furosemide -teriparatide -thalidomide This list may not describe all possible interactions. Give your health care provider a list of all the medicines, herbs, non-prescription drugs, or dietary supplements you use. Also tell them if you smoke, drink alcohol, or use illegal drugs. Some items may interact with your medicine. What should I watch for while using this medicine? Visit  your doctor or health care professional for regular checkups. It may be some time before you see the benefit from this medicine. Do not stop taking your medicine unless your doctor tells you to. Your doctor may order blood tests or other tests to see how you are doing. Women should inform their doctor if they wish to become pregnant or think they might be pregnant. There is a potential for serious side effects to an unborn child. Talk to your health care professional or pharmacist for more information. You should make sure that you get enough calcium and vitamin D while you are taking this medicine. Discuss the foods you eat and the vitamins you take with your health care professional. Some people who take this medicine have severe bone, joint, and/or muscle pain. This medicine may also increase your risk for a broken thigh bone. Tell your doctor right away if you have pain in your upper leg or groin. Tell your doctor if you have any pain that does not go away or that gets worse. What side effects may I notice from receiving this medicine? Side effects that you should report to your doctor or health care professional as soon as possible: -allergic reactions like skin rash, itching or hives, swelling of the face, lips, or tongue -anxiety, confusion, or depression -breathing problems -changes in vision -feeling faint or lightheaded, falls -jaw burning, cramping, pain -muscle cramps, stiffness, or weakness -trouble passing urine or change in the amount of urine Side effects that usually do not require medical attention (report to your doctor or health care professional if they continue or are bothersome): -bone, joint, or muscle pain -  fever -hair loss -irritation at site where injected -loss of appetite -nausea, vomiting -stomach upset -tired This list may not describe all possible side effects. Call your doctor for medical advice about side effects. You may report side effects to FDA at  1-800-FDA-1088. Where should I keep my medicine? This drug is given in a hospital or clinic and will not be stored at home. NOTE: This sheet is a summary. It may not cover all possible information. If you have questions about this medicine, talk to your doctor, pharmacist, or health care provider.  2013, [employer]. (03/13/2011 9:06:58 AM)  

## 2013-01-02 LAB — PROTEIN ELECTROPHORESIS, SERUM
Albumin ELP: 56.3 % (ref 55.8–66.1)
Alpha-1-Globulin: 5.5 % — ABNORMAL HIGH (ref 2.9–4.9)
Beta 2: 4.8 % (ref 3.2–6.5)
Beta Globulin: 8.1 % — ABNORMAL HIGH (ref 4.7–7.2)

## 2013-01-02 LAB — KAPPA/LAMBDA LIGHT CHAINS: Kappa:Lambda Ratio: 0.78 (ref 0.26–1.65)

## 2013-01-18 ENCOUNTER — Ambulatory Visit (INDEPENDENT_AMBULATORY_CARE_PROVIDER_SITE_OTHER): Payer: Medicare Other | Admitting: Family Medicine

## 2013-01-18 ENCOUNTER — Other Ambulatory Visit: Payer: Self-pay | Admitting: Family Medicine

## 2013-01-18 ENCOUNTER — Encounter: Payer: Self-pay | Admitting: Family Medicine

## 2013-01-18 VITALS — BP 142/84 | HR 76 | Ht 65.0 in | Wt 152.0 lb

## 2013-01-18 DIAGNOSIS — H00019 Hordeolum externum unspecified eye, unspecified eyelid: Secondary | ICD-10-CM

## 2013-01-18 DIAGNOSIS — I10 Essential (primary) hypertension: Secondary | ICD-10-CM

## 2013-01-18 DIAGNOSIS — H109 Unspecified conjunctivitis: Secondary | ICD-10-CM

## 2013-01-18 DIAGNOSIS — H00016 Hordeolum externum left eye, unspecified eyelid: Secondary | ICD-10-CM

## 2013-01-18 MED ORDER — ALPRAZOLAM 1 MG PO TABS: 0.5000 mg | ORAL_TABLET | ORAL | Status: DC | PRN

## 2013-01-18 MED ORDER — ERYTHROMYCIN 5 MG/GM OP OINT: TOPICAL_OINTMENT | Freq: Four times a day (QID) | OPHTHALMIC | Status: DC

## 2013-01-18 NOTE — Progress Notes (Signed)
Subjective:    Patient ID: [name], female    DOB: December 21, 1935, 77 y.o.   MRN: [id number]  HPI Left eye started with stye 2 weeks ago. Has been doing warm compresses.  Feels it is getting worse. No drinage. Feels a littlle ithcy and scartchy.  No change in vision.  No fever.  Has sticky stuff in eye in AM. No URI SXS. No pain.  No OTC treatment.    HTN -  Pt denies chest pain, SOB, dizziness, or heart palpitations.  Taking meds as directed w/o problems.  Denies medication side effects. No LE extemity.    Review of Systems BP 142/84  Pulse 76  Ht 5\' 5"  (1.651 m)  Wt 152 lb (68.947 kg)  BMI 25.29 kg/m2    Allergies  Allergen Reactions  . Simvastatin Other (See Comments)    Unsure, Joint aches listed previously     Past Medical History  Diagnosis Date  . Anxiety   . Hypertension   . Hyperlipidemia   . Depression   . Osteopenia   . Multiple myeloma 03/2012  . Acute renal failure      due to hypercalcemia and suspected multiple myeloma resolved =with IVF's    . Lytic bone lesions on xray   . Acute subdural hematoma 03/2012     recent fall in hospital stay,resolved+  . Normocytic anemia     due to chemo and multiple myeloma,no active bleding  . Cancer     mul;tiple myeloma, bone lesions r 7th rib,   . Arthritis     spine  . Atrial fibrillation   . Radiation 05/09/12-05/18/12    20 gray in 8 Fx's right hip palliative  . Status post chemotherapy     velcade and revlimid    Past Surgical History  Procedure Laterality Date  . Breast lumpectomy    . 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 Tonsillectomy      age 10    History   Social History  . Marital Status: Married    Spouse Name: N/A    Number of Children: 1  . Years of Education: N/A   Occupational History  .      [employer]   Social History Main Topics  . Smoking status: Never Smoker   . Smokeless tobacco:  Never Used  . Alcohol Use: No  . Drug Use: No  . Sexually Active: Not on file     Comment: menses age 43, pregnancy age 74, p!G!   Other Topics Concern  . Not on file   Social History Narrative   Married, 1 son, retired from [employer], lives in [place]    Family History  Problem Relation Age of Onset  . Heart disease Mother   . Hyperlipidemia Sister   . Hypertension Sister   . Diabetes Sister   . Diabetes Sister   . Hyperlipidemia Sister   . Hypertension Sister   . Sudden death Neg Hx   . Heart attack Neg Hx     Outpatient Encounter Prescriptions as of 01/18/2013  Medication Sig Dispense Refill  . acetaminophen (TYLENOL) 500 MG tablet Take 500-1,000 mg by mouth every 4 (four) hours as needed. No more than 6 tabs in one day.      . ALPRAZolam (XANAX) 1 MG tablet Take 0.5-1 tablets (0.5-1 mg total) by mouth as needed for sleep or anxiety.  30 tablet  0  .  dexamethasone (DECADRON) 4 MG tablet Take 40 mg by mouth every Tuesday. with chemotherapy drug      . erythromycin ophthalmic ointment Place into the left eye every 6 (six) hours. X 1 week.  3.5 g  0  . lenalidomide (REVLIMID) 15 MG capsule Take 1 capsule (15 mg total) by mouth See admin instructions. Daily for 21 days, then 7 days off  21 capsule  0  . losartan (COZAAR) 50 MG tablet Take 50 mg by mouth daily.      . Multiple Vitamin (MULTIVITAMIN WITH MINERALS) TABS Take 1 tablet by mouth daily.      . ondansetron (ZOFRAN) 4 MG tablet Take 4 mg by mouth every 4 (four) hours as needed for nausea. Take 1 tablet every 8 hours as needed for nausea      . potassium chloride SA (K-DUR,KLOR-CON) 20 MEQ tablet Take 20 mEq by mouth daily.      . prochlorperazine (COMPAZINE) 10 MG tablet Take 10 mg by mouth every 6 (six) hours as needed. Take 1 tablet every 6 hours as directed      . rosuvastatin (CRESTOR) 10 MG tablet Take 10 mg by mouth daily.      . [DISCONTINUED] ALPRAZolam (XANAX) 1 MG tablet Take 0.5-1 mg by mouth as needed for  sleep or anxiety.      . [DISCONTINUED] fentaNYL (DURAGESIC - DOSED MCG/HR) 12 MCG/HR Place 1 patch (12.5 mcg total) onto the skin every 3 (three) days.  10 patch  0   No facility-administered encounter medications on file as of 01/18/2013.          Objective:   Physical Exam  Constitutional: She appears well-developed and well-nourished.  HENT:  Head: Normocephalic and atraumatic.  Right Ear: External ear normal.  Left Ear: External ear normal.  Eyes: Pupils are equal, round, and reactive to light.  Large 1cm stye on left upper lid with sclera injection and some mild erythema over the lid. No evidence of periorbital cellulitis.   Skin: Skin is warm and dry.  Psychiatric: She has a normal mood and affect. Her behavior is normal.          Assessment & Plan:  Stye with conjunctivitis - will treat with topical erythromycin ophthalmic ointment. I put some ointment in her eye today during the office visit she has time to get to the pharmacy and pick up this afternoon. Encouraged her to use it 4 times a day. We will work on getting her in with ophthalmologist either tomorrow or the next day before the weekend to see if they feel like it should be lanced or injected. She's had it for 2 weeks at this point in time and has not been improving with conservative therapy.  HTN- well controlled today. Continue current regimen. F/U in 3 mo.

## 2013-01-18 NOTE — Patient Instructions (Signed)
Apply ointment 4 times a day WE will call you with the referral.

## 2013-01-20 ENCOUNTER — Other Ambulatory Visit: Payer: Self-pay | Admitting: *Deleted

## 2013-01-20 MED ORDER — LENALIDOMIDE 15 MG PO CAPS: 15.0000 mg | ORAL_CAPSULE | ORAL | Status: DC

## 2013-01-20 NOTE — Telephone Encounter (Signed)
Faxed notification from [facility] that Revlimid script received.

## 2013-01-24 ENCOUNTER — Encounter: Payer: Self-pay | Admitting: *Deleted

## 2013-01-24 NOTE — Progress Notes (Signed)
Fax rec'd from [facility], they shipped pt's Revlimid on 01/23/13 for delivery today.

## 2013-01-26 ENCOUNTER — Other Ambulatory Visit (HOSPITAL_BASED_OUTPATIENT_CLINIC_OR_DEPARTMENT_OTHER): Payer: Medicare Other | Admitting: Lab

## 2013-01-26 ENCOUNTER — Ambulatory Visit (HOSPITAL_BASED_OUTPATIENT_CLINIC_OR_DEPARTMENT_OTHER): Payer: Medicare Other

## 2013-01-26 VITALS — BP 155/83 | HR 70

## 2013-01-26 DIAGNOSIS — C9000 Multiple myeloma not having achieved remission: Secondary | ICD-10-CM

## 2013-01-26 DIAGNOSIS — C9030 Solitary plasmacytoma not having achieved remission: Secondary | ICD-10-CM

## 2013-01-26 DIAGNOSIS — S065XAA Traumatic subdural hemorrhage with loss of consciousness status unknown, initial encounter: Secondary | ICD-10-CM

## 2013-01-26 DIAGNOSIS — M898X9 Other specified disorders of bone, unspecified site: Secondary | ICD-10-CM

## 2013-01-26 DIAGNOSIS — N63 Unspecified lump in unspecified breast: Secondary | ICD-10-CM

## 2013-01-26 DIAGNOSIS — F3289 Other specified depressive episodes: Secondary | ICD-10-CM

## 2013-01-26 DIAGNOSIS — I10 Essential (primary) hypertension: Secondary | ICD-10-CM

## 2013-01-26 DIAGNOSIS — N179 Acute kidney failure, unspecified: Secondary | ICD-10-CM

## 2013-01-26 DIAGNOSIS — E860 Dehydration: Secondary | ICD-10-CM

## 2013-01-26 DIAGNOSIS — Z780 Asymptomatic menopausal state: Secondary | ICD-10-CM

## 2013-01-26 DIAGNOSIS — S065X9A Traumatic subdural hemorrhage with loss of consciousness of unspecified duration, initial encounter: Secondary | ICD-10-CM

## 2013-01-26 DIAGNOSIS — M25559 Pain in unspecified hip: Secondary | ICD-10-CM

## 2013-01-26 DIAGNOSIS — E669 Obesity, unspecified: Secondary | ICD-10-CM

## 2013-01-26 DIAGNOSIS — K5901 Slow transit constipation: Secondary | ICD-10-CM

## 2013-01-26 DIAGNOSIS — D649 Anemia, unspecified: Secondary | ICD-10-CM

## 2013-01-26 DIAGNOSIS — G4700 Insomnia, unspecified: Secondary | ICD-10-CM

## 2013-01-26 DIAGNOSIS — F329 Major depressive disorder, single episode, unspecified: Secondary | ICD-10-CM

## 2013-01-26 DIAGNOSIS — E785 Hyperlipidemia, unspecified: Secondary | ICD-10-CM

## 2013-01-26 DIAGNOSIS — M545 Low back pain, unspecified: Secondary | ICD-10-CM

## 2013-01-26 DIAGNOSIS — R7989 Other specified abnormal findings of blood chemistry: Secondary | ICD-10-CM

## 2013-01-26 DIAGNOSIS — M899 Disorder of bone, unspecified: Secondary | ICD-10-CM

## 2013-01-26 DIAGNOSIS — M949 Disorder of cartilage, unspecified: Secondary | ICD-10-CM

## 2013-01-26 DIAGNOSIS — F411 Generalized anxiety disorder: Secondary | ICD-10-CM

## 2013-01-26 LAB — CBC WITH DIFFERENTIAL/PLATELET
Basophils Absolute: 0.1 10*3/uL (ref 0.0–0.1)
Eosinophils Absolute: 0.2 10*3/uL (ref 0.0–0.5)
HGB: 11.2 g/dL — ABNORMAL LOW (ref 11.6–15.9)
NEUT#: 2 10*3/uL (ref 1.5–6.5)
RBC: 3.53 10*6/uL — ABNORMAL LOW (ref 3.70–5.45)
RDW: 13.7 % (ref 11.2–14.5)
WBC: 4.2 10*3/uL (ref 3.9–10.3)
lymph#: 1.5 10*3/uL (ref 0.9–3.3)
nRBC: 0 % (ref 0–0)

## 2013-01-26 LAB — BASIC METABOLIC PANEL (CC13)
CO2: 26 mEq/L (ref 22–29)
Calcium: 9.6 mg/dL (ref 8.4–10.4)
Glucose: 84 mg/dl (ref 70–99)
Potassium: 3.8 mEq/L (ref 3.5–5.1)
Sodium: 143 mEq/L (ref 136–145)

## 2013-01-26 MED ORDER — ZOLEDRONIC ACID 4 MG/5ML IV CONC: 4.0000 mg | Freq: Once | INTRAVENOUS | Status: DC

## 2013-01-26 MED ORDER — ZOLEDRONIC ACID 4 MG/100ML IV SOLN
4.0000 mg | Freq: Once | INTRAVENOUS | Status: AC
  Administered 2013-01-26: 4 mg via INTRAVENOUS
  Filled 2013-01-26: qty 100

## 2013-01-26 NOTE — Patient Instructions (Signed)
Zoledronic Acid injection (Hypercalcemia, Oncology) What is this medicine? ZOLEDRONIC ACID (ZOE le dron ik AS id) lowers the amount of calcium loss from bone. It is used to treat too much calcium in your blood from cancer. It is also used to prevent complications of cancer that has spread to the bone. This medicine may be used for other purposes; ask your health care provider or pharmacist if you have questions. What should I tell my health care provider before I take this medicine? They need to know if you have any of these conditions: -aspirin-sensitive asthma -dental disease -kidney disease -an unusual or allergic reaction to zoledronic acid, other medicines, foods, dyes, or preservatives -pregnant or trying to get pregnant -breast-feeding How should I use this medicine? This medicine is for infusion into a vein. It is given by a health care professional in a hospital or clinic setting. Talk to your pediatrician regarding the use of this medicine in children. Special care may be needed. Overdosage: If you think you have taken too much of this medicine contact a poison control center or emergency room at once. NOTE: This medicine is only for you. Do not share this medicine with others. What if I miss a dose? It is important not to miss your dose. Call your doctor or health care professional if you are unable to keep an appointment. What may interact with this medicine? -certain antibiotics given by injection -NSAIDs, medicines for pain and inflammation, like ibuprofen or naproxen -some diuretics like bumetanide, furosemide -teriparatide -thalidomide This list may not describe all possible interactions. Give your health care provider a list of all the medicines, herbs, non-prescription drugs, or dietary supplements you use. Also tell them if you smoke, drink alcohol, or use illegal drugs. Some items may interact with your medicine. What should I watch for while using this medicine? Visit  your doctor or health care professional for regular checkups. It may be some time before you see the benefit from this medicine. Do not stop taking your medicine unless your doctor tells you to. Your doctor may order blood tests or other tests to see how you are doing. Women should inform their doctor if they wish to become pregnant or think they might be pregnant. There is a potential for serious side effects to an unborn child. Talk to your health care professional or pharmacist for more information. You should make sure that you get enough calcium and vitamin D while you are taking this medicine. Discuss the foods you eat and the vitamins you take with your health care professional. Some people who take this medicine have severe bone, joint, and/or muscle pain. This medicine may also increase your risk for a broken thigh bone. Tell your doctor right away if you have pain in your upper leg or groin. Tell your doctor if you have any pain that does not go away or that gets worse. What side effects may I notice from receiving this medicine? Side effects that you should report to your doctor or health care professional as soon as possible: -allergic reactions like skin rash, itching or hives, swelling of the face, lips, or tongue -anxiety, confusion, or depression -breathing problems -changes in vision -feeling faint or lightheaded, falls -jaw burning, cramping, pain -muscle cramps, stiffness, or weakness -trouble passing urine or change in the amount of urine Side effects that usually do not require medical attention (report to your doctor or health care professional if they continue or are bothersome): -bone, joint, or muscle pain -  fever -hair loss -irritation at site where injected -loss of appetite -nausea, vomiting -stomach upset -tired This list may not describe all possible side effects. Call your doctor for medical advice about side effects. You may report side effects to FDA at  1-800-FDA-1088. Where should I keep my medicine? This drug is given in a hospital or clinic and will not be stored at home. NOTE: This sheet is a summary. It may not cover all possible information. If you have questions about this medicine, talk to your doctor, pharmacist, or health care provider.  2013, [employer]. (03/13/2011 9:06:58 AM)  

## 2013-02-16 ENCOUNTER — Other Ambulatory Visit: Payer: Self-pay | Admitting: Family Medicine

## 2013-02-16 ENCOUNTER — Other Ambulatory Visit: Payer: Self-pay | Admitting: *Deleted

## 2013-02-16 MED ORDER — LENALIDOMIDE 15 MG PO CAPS: 15.0000 mg | ORAL_CAPSULE | ORAL | Status: DC

## 2013-02-17 ENCOUNTER — Encounter: Payer: Self-pay | Admitting: *Deleted

## 2013-02-17 ENCOUNTER — Other Ambulatory Visit: Payer: Self-pay | Admitting: Family Medicine

## 2013-02-17 NOTE — Progress Notes (Signed)
Fax received from [facility] they shipped Revlimid on 5/22 for delivery today.

## 2013-02-23 ENCOUNTER — Other Ambulatory Visit (HOSPITAL_BASED_OUTPATIENT_CLINIC_OR_DEPARTMENT_OTHER): Payer: Medicare Other | Admitting: Lab

## 2013-02-23 ENCOUNTER — Ambulatory Visit (HOSPITAL_BASED_OUTPATIENT_CLINIC_OR_DEPARTMENT_OTHER): Payer: Medicare Other | Admitting: Oncology

## 2013-02-23 ENCOUNTER — Other Ambulatory Visit: Payer: Medicare Other | Admitting: Lab

## 2013-02-23 ENCOUNTER — Telehealth: Payer: Self-pay | Admitting: Oncology

## 2013-02-23 ENCOUNTER — Ambulatory Visit (HOSPITAL_BASED_OUTPATIENT_CLINIC_OR_DEPARTMENT_OTHER): Payer: Medicare Other

## 2013-02-23 VITALS — BP 157/87 | HR 68 | Temp 96.0°F | Resp 19 | Ht 65.0 in | Wt 154.7 lb

## 2013-02-23 DIAGNOSIS — M898X9 Other specified disorders of bone, unspecified site: Secondary | ICD-10-CM

## 2013-02-23 DIAGNOSIS — C9000 Multiple myeloma not having achieved remission: Secondary | ICD-10-CM

## 2013-02-23 DIAGNOSIS — M545 Low back pain, unspecified: Secondary | ICD-10-CM

## 2013-02-23 DIAGNOSIS — C9001 Multiple myeloma in remission: Secondary | ICD-10-CM

## 2013-02-23 DIAGNOSIS — M949 Disorder of cartilage, unspecified: Secondary | ICD-10-CM

## 2013-02-23 DIAGNOSIS — E860 Dehydration: Secondary | ICD-10-CM

## 2013-02-23 DIAGNOSIS — Z780 Asymptomatic menopausal state: Secondary | ICD-10-CM

## 2013-02-23 DIAGNOSIS — S065X9A Traumatic subdural hemorrhage with loss of consciousness of unspecified duration, initial encounter: Secondary | ICD-10-CM

## 2013-02-23 DIAGNOSIS — M25559 Pain in unspecified hip: Secondary | ICD-10-CM

## 2013-02-23 DIAGNOSIS — N179 Acute kidney failure, unspecified: Secondary | ICD-10-CM

## 2013-02-23 DIAGNOSIS — G4700 Insomnia, unspecified: Secondary | ICD-10-CM

## 2013-02-23 DIAGNOSIS — G8929 Other chronic pain: Secondary | ICD-10-CM

## 2013-02-23 DIAGNOSIS — K5901 Slow transit constipation: Secondary | ICD-10-CM

## 2013-02-23 DIAGNOSIS — R7989 Other specified abnormal findings of blood chemistry: Secondary | ICD-10-CM

## 2013-02-23 DIAGNOSIS — M899 Disorder of bone, unspecified: Secondary | ICD-10-CM

## 2013-02-23 DIAGNOSIS — F411 Generalized anxiety disorder: Secondary | ICD-10-CM

## 2013-02-23 DIAGNOSIS — F329 Major depressive disorder, single episode, unspecified: Secondary | ICD-10-CM

## 2013-02-23 DIAGNOSIS — I10 Essential (primary) hypertension: Secondary | ICD-10-CM

## 2013-02-23 DIAGNOSIS — S065XAA Traumatic subdural hemorrhage with loss of consciousness status unknown, initial encounter: Secondary | ICD-10-CM

## 2013-02-23 DIAGNOSIS — C9030 Solitary plasmacytoma not having achieved remission: Secondary | ICD-10-CM

## 2013-02-23 DIAGNOSIS — E785 Hyperlipidemia, unspecified: Secondary | ICD-10-CM

## 2013-02-23 DIAGNOSIS — D638 Anemia in other chronic diseases classified elsewhere: Secondary | ICD-10-CM

## 2013-02-23 DIAGNOSIS — N63 Unspecified lump in unspecified breast: Secondary | ICD-10-CM

## 2013-02-23 DIAGNOSIS — D649 Anemia, unspecified: Secondary | ICD-10-CM

## 2013-02-23 DIAGNOSIS — F3289 Other specified depressive episodes: Secondary | ICD-10-CM

## 2013-02-23 DIAGNOSIS — E669 Obesity, unspecified: Secondary | ICD-10-CM

## 2013-02-23 LAB — COMPREHENSIVE METABOLIC PANEL (CC13)
ALT: 19 U/L (ref 0–55)
Albumin: 3.2 g/dL — ABNORMAL LOW (ref 3.5–5.0)
Alkaline Phosphatase: 49 U/L (ref 40–150)
CO2: 26 mEq/L (ref 22–29)
Glucose: 83 mg/dl (ref 70–99)
Potassium: 4.5 mEq/L (ref 3.5–5.1)
Sodium: 140 mEq/L (ref 136–145)
Total Protein: 6.7 g/dL (ref 6.4–8.3)

## 2013-02-23 LAB — CBC WITH DIFFERENTIAL/PLATELET
Basophils Absolute: 0.1 10*3/uL (ref 0.0–0.1)
Eosinophils Absolute: 0.3 10*3/uL (ref 0.0–0.5)
HCT: 30.5 % — ABNORMAL LOW (ref 34.8–46.6)
HGB: 10 g/dL — ABNORMAL LOW (ref 11.6–15.9)
LYMPH%: 30.6 % (ref 14.0–49.7)
MONO#: 0.6 10*3/uL (ref 0.1–0.9)
NEUT#: 2.3 10*3/uL (ref 1.5–6.5)
NEUT%: 47.7 % (ref 38.4–76.8)
Platelets: 173 10*3/uL (ref 145–400)
WBC: 4.8 10*3/uL (ref 3.9–10.3)

## 2013-02-23 MED ORDER — SODIUM CHLORIDE 0.9 % IV SOLN
INTRAVENOUS | Status: DC
  Administered 2013-02-23: 16:00:00 via INTRAVENOUS

## 2013-02-23 MED ORDER — ZOLEDRONIC ACID 4 MG/100ML IV SOLN
4.0000 mg | Freq: Once | INTRAVENOUS | Status: AC
  Administered 2013-02-23: 4 mg via INTRAVENOUS
  Filled 2013-02-23: qty 100

## 2013-02-23 NOTE — Progress Notes (Signed)
[facility]  Telephone:(336) [phone] Fax:(336) [phone]   OFFICE PROGRESS NOTE   Cc:  METHENEY,CATHERINE, MD  DIAGNOSIS: history of IgA kappa multiple myeloma. She presented with diffuse lytic bone lesions, renal insufficiency, hypercalcemia, anemia. Her M-spike was 4.06 gm/dL. Her IgA was elevated at 4,020 mg/dL; IgG 353; IgM 5; serum kappa was 2.11; lambda 0.60; serum kappa to lambda ratio elevated at 3.06. Spot urine showed elevated kappa:lambda at 579; and positive for Bence Jones protein. Bone marrow biopsy on 04/08/2012 showed 92% plasma cell. Cytogenetics showed multiple chromosomal abnormalities; additional chromosomal material on chromosome 8p; 14q; loss of chromosome 8 and 13. 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   PAST THERAPY: Started on SQ Velcade 1.3mg /m2 on 04/12/2012; weekly; 3 weeks on; 1 week off. She started on Revlimid 15mg  PO daily d1-21 on 04/21/2012. She is also on Dexamethasone 40mg  PO once weekly. She also received palliative radiation 20 Gy in 8 fractions to the bilateral hips.   CURRENT THERAPY: started maintenance Revlimid 15mg  PO daily d1-21 every 4 weeks in 10/2012.  She is continued on monthly Zometa.    INTERVAL HISTORY: [name] 77 y.o. female returns for regular follow up with her husband for regular follow up. She reports feeling well.  She is now independent of all activities of daily living. She is able to drive her car again.  She has mild, chronic lower back pain, worst in the a.m.; improved as the day progresses.  She takes Tylenol about twice daily with improvement of her back pain.  Pain does not radiate nor does it cause neuropathy, paresthesia, leg weakness, bowel/bladder incontinence.  Patient denies fever, anorexia, weight loss, fatigue, headache, visual changes, confusion, drenching night sweats, palpable lymph node swelling, mucositis, odynophagia, dysphagia, nausea vomiting, jaundice, chest pain, palpitation,  shortness of breath, dyspnea on exertion, productive cough, gum bleeding, epistaxis, hematemesis, hemoptysis, abdominal pain, abdominal swelling, early satiety, melena, hematochezia, hematuria, skin rash, spontaneous bleeding, heat or cold intolerance, bowel bladder incontinence, focal motor weakness, paresthesia, depression.       Past Medical History  Diagnosis Date  . Anxiety   . Hypertension   . Hyperlipidemia   . Depression   . Osteopenia   . Multiple myeloma 03/2012  . Acute renal failure      due to hypercalcemia and suspected multiple myeloma resolved =with IVF's    . Lytic bone lesions on xray   . Acute subdural hematoma 03/2012     recent fall in hospital stay,resolved+  . Normocytic anemia     due to chemo and multiple myeloma,no active bleding  . Cancer     mul;tiple myeloma, bone lesions r 7th rib,   . Arthritis     spine  . Atrial fibrillation   . Radiation 05/09/12-05/18/12    20 gray in 8 Fx's right hip palliative  . Status post chemotherapy     velcade and revlimid    Past Surgical History  Procedure Laterality Date  . Breast lumpectomy    . 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 Tonsillectomy      age 49    Current Outpatient Prescriptions  Medication Sig Dispense Refill  . acetaminophen (TYLENOL) 500 MG tablet Take 500-1,000 mg by mouth every 4 (four) hours as needed. No more than 6 tabs in one day.      . ALPRAZolam (XANAX) 1 MG tablet TAKE  1/2 TO 1 TABLET DAILY AS NEEDED FOR SLEEP OR ANXIETY.  30 tablet  0  . erythromycin ophthalmic ointment Place into the left eye every 6 (six) hours. X 1 week.  3.5 g  0  . lenalidomide (REVLIMID) 15 MG capsule Take 1 capsule (15 mg total) by mouth See admin instructions. Daily for 21 days, then 7 days off  21 capsule  0  . losartan (COZAAR) 50 MG tablet Take 50 mg by mouth daily.      . Multiple Vitamin (MULTIVITAMIN WITH  MINERALS) TABS Take 1 tablet by mouth daily.      . ondansetron (ZOFRAN) 4 MG tablet Take 4 mg by mouth every 4 (four) hours as needed for nausea. Take 1 tablet every 8 hours as needed for nausea      . potassium chloride SA (K-DUR,KLOR-CON) 20 MEQ tablet Take 20 mEq by mouth daily.      . prochlorperazine (COMPAZINE) 10 MG tablet Take 10 mg by mouth every 6 (six) hours as needed. Take 1 tablet every 6 hours as directed      . rosuvastatin (CRESTOR) 10 MG tablet Take 10 mg by mouth daily.       No current facility-administered medications for this visit.   Facility-Administered Medications Ordered in Other Visits  Medication Dose Route Frequency Provider Last Rate Last Dose  . 0.9 %  sodium chloride infusion   Intravenous Continuous Exie Parody, MD        ALLERGIES:  is allergic to simvastatin.  REVIEW OF SYSTEMS:  The rest of the 14-point review of system was negative.   Filed Vitals:   02/23/13 1400  BP: 157/87  Pulse: 68  Temp: 96 F (35.6 C)  Resp: 19   Wt Readings from Last 3 Encounters:  02/23/13 154 lb 11.2 oz (70.171 kg)  01/18/13 152 lb (68.947 kg)  12/29/12 153 lb 9.6 oz (69.673 kg)   ECOG Performance status: 0-1  PHYSICAL EXAMINATION:   General:  well-nourished woman, in no acute distress.  Eyes:  no scleral icterus.  ENT:  There were no oropharyngeal lesions.  Neck was without thyromegaly.  Lymphatics:  Negative cervical, supraclavicular or axillary adenopathy.  Respiratory: lungs were clear bilaterally without wheezing or crackles.  Cardiovascular:  Regular rate and rhythm, S1/S2, without murmur, rub or gallop.  There was no pedal edema.  GI:  abdomen was soft, flat, nontender, nondistended, without organomegaly.  Muscoloskeletal:  no spinal tenderness of palpation of vertebral spine.  Skin exam was without echymosis, petichae.  Neuro exam was nonfocal.  Patient was able to get on and off exam table without assistance.  Gait was normal.  She ambulates without a walker or  cane.   Patient was alert and oriented.  Attention was good.   Language was appropriate.  Mood was normal without depression.  Speech was not pressured.  Thought content was not tangential.     LABORATORY/RADIOLOGY DATA:  Lab Results  Component Value Date   WBC 4.8 02/23/2013   HGB 10.0* 02/23/2013   HCT 30.5* 02/23/2013   PLT 173 02/23/2013   GLUCOSE 83 02/23/2013   CHOL 255* 03/03/2012   TRIG 117 03/03/2012   HDL 62 03/03/2012   LDLDIRECT 166* 04/24/2011   LDLCALC 170* 03/03/2012   ALKPHOS 49 02/23/2013   ALT 19 02/23/2013   AST 23 02/23/2013   NA 140 02/23/2013   K 4.5 02/23/2013   CL 105 02/23/2013   CREATININE 1.6* 02/23/2013   BUN 20.9  02/23/2013   CO2 26 02/23/2013   INR 1.10* 11/01/2012   HGBA1C 6.6* 05/03/2012     ASSESSMENT AND PLAN:   1. Hypertension:  on losartan.  2. Hyperlipidemia: on Crestor.  3. Lytic bone lesions causing bone pain: much improved with radiation. I will continue with monthly Zometa for 2 years total until 03/2014. She needs Tylenol sparingly.  She has not had complication with Zometa.  4. Mild chronic normocytic anemia: due to Revlimid. This is stable.   5. Multiple myeloma:  She is in remission.   -She is doing well on maintenance Revlimid without side effect.  I advised her to continue this for now. She was deemed not to be candidate for BMT by [facility].  6. Follow up: in one month for lab and Zometa.  Return visit in about 2 months.    I informed Ms. Bollen that I am leaving the practice.  [facility] for her to see a new provider when she returns.     Huan T. Gaylyn Rong, M.D.

## 2013-02-23 NOTE — Telephone Encounter (Signed)
Gave pt appt for lab and ML on July 2014 °

## 2013-02-23 NOTE — Patient Instructions (Signed)
Zoledronic Acid injection (Hypercalcemia, Oncology) What is this medicine? ZOLEDRONIC ACID (ZOE le dron ik AS id) lowers the amount of calcium loss from bone. It is used to treat too much calcium in your blood from cancer. It is also used to prevent complications of cancer that has spread to the bone. This medicine may be used for other purposes; ask your health care provider or pharmacist if you have questions. What should I tell my health care provider before I take this medicine? They need to know if you have any of these conditions: -aspirin-sensitive asthma -dental disease -kidney disease -an unusual or allergic reaction to zoledronic acid, other medicines, foods, dyes, or preservatives -pregnant or trying to get pregnant -breast-feeding How should I use this medicine? This medicine is for infusion into a vein. It is given by a health care professional in a hospital or clinic setting. Talk to your pediatrician regarding the use of this medicine in children. Special care may be needed. Overdosage: If you think you have taken too much of this medicine contact a poison control center or emergency room at once. NOTE: This medicine is only for you. Do not share this medicine with others. What if I miss a dose? It is important not to miss your dose. Call your doctor or health care professional if you are unable to keep an appointment. What may interact with this medicine? -certain antibiotics given by injection -NSAIDs, medicines for pain and inflammation, like ibuprofen or naproxen -some diuretics like bumetanide, furosemide -teriparatide -thalidomide This list may not describe all possible interactions. Give your health care provider a list of all the medicines, herbs, non-prescription drugs, or dietary supplements you use. Also tell them if you smoke, drink alcohol, or use illegal drugs. Some items may interact with your medicine. What should I watch for while using this medicine? Visit  your doctor or health care professional for regular checkups. It may be some time before you see the benefit from this medicine. Do not stop taking your medicine unless your doctor tells you to. Your doctor may order blood tests or other tests to see how you are doing. Women should inform their doctor if they wish to become pregnant or think they might be pregnant. There is a potential for serious side effects to an unborn child. Talk to your health care professional or pharmacist for more information. You should make sure that you get enough calcium and vitamin D while you are taking this medicine. Discuss the foods you eat and the vitamins you take with your health care professional. Some people who take this medicine have severe bone, joint, and/or muscle pain. This medicine may also increase your risk for a broken thigh bone. Tell your doctor right away if you have pain in your upper leg or groin. Tell your doctor if you have any pain that does not go away or that gets worse. What side effects may I notice from receiving this medicine? Side effects that you should report to your doctor or health care professional as soon as possible: -allergic reactions like skin rash, itching or hives, swelling of the face, lips, or tongue -anxiety, confusion, or depression -breathing problems -changes in vision -feeling faint or lightheaded, falls -jaw burning, cramping, pain -muscle cramps, stiffness, or weakness -trouble passing urine or change in the amount of urine Side effects that usually do not require medical attention (report to your doctor or health care professional if they continue or are bothersome): -bone, joint, or muscle pain -  fever -hair loss -irritation at site where injected -loss of appetite -nausea, vomiting -stomach upset -tired This list may not describe all possible side effects. Call your doctor for medical advice about side effects. You may report side effects to FDA at  1-800-FDA-1088. Where should I keep my medicine? This drug is given in a hospital or clinic and will not be stored at home. NOTE: This sheet is a summary. It may not cover all possible information. If you have questions about this medicine, talk to your doctor, pharmacist, or health care provider.  2013, [employer]. (03/13/2011 9:06:58 AM)  

## 2013-02-24 ENCOUNTER — Telehealth: Payer: Self-pay | Admitting: Oncology

## 2013-02-24 ENCOUNTER — Telehealth: Payer: Self-pay | Admitting: *Deleted

## 2013-02-24 NOTE — Telephone Encounter (Signed)
Per staff message and POF I have scheduled appts.  JMW  

## 2013-02-27 LAB — PROTEIN ELECTROPHORESIS, SERUM
Alpha-1-Globulin: 6.2 % — ABNORMAL HIGH (ref 2.9–4.9)
Alpha-2-Globulin: 13.9 % — ABNORMAL HIGH (ref 7.1–11.8)
Beta 2: 5.4 % (ref 3.2–6.5)
Gamma Globulin: 14.3 % (ref 11.1–18.8)

## 2013-02-27 LAB — KAPPA/LAMBDA LIGHT CHAINS: Kappa free light chain: 3.21 mg/dL — ABNORMAL HIGH (ref 0.33–1.94)

## 2013-03-09 ENCOUNTER — Other Ambulatory Visit: Payer: Self-pay | Admitting: Family Medicine

## 2013-03-17 ENCOUNTER — Other Ambulatory Visit: Payer: Self-pay | Admitting: *Deleted

## 2013-03-17 NOTE — Telephone Encounter (Signed)
THIS REFILL REQUEST FOR REVLIMID WAS PLACED IN DR.HA'S ACTIVE WORK FOLDER. 

## 2013-03-20 ENCOUNTER — Other Ambulatory Visit: Payer: Self-pay | Admitting: Family Medicine

## 2013-03-21 ENCOUNTER — Encounter: Payer: Self-pay | Admitting: *Deleted

## 2013-03-21 NOTE — Progress Notes (Signed)
Fax received from [employer], they shipped pt's Revlimid on 6/23 for delivery today.

## 2013-03-23 ENCOUNTER — Other Ambulatory Visit: Payer: Medicare Other | Admitting: Lab

## 2013-03-23 ENCOUNTER — Ambulatory Visit (HOSPITAL_BASED_OUTPATIENT_CLINIC_OR_DEPARTMENT_OTHER): Payer: Medicare Other

## 2013-03-23 VITALS — BP 159/86 | HR 74 | Temp 97.0°F

## 2013-03-23 DIAGNOSIS — F329 Major depressive disorder, single episode, unspecified: Secondary | ICD-10-CM

## 2013-03-23 DIAGNOSIS — G4700 Insomnia, unspecified: Secondary | ICD-10-CM

## 2013-03-23 DIAGNOSIS — M25559 Pain in unspecified hip: Secondary | ICD-10-CM

## 2013-03-23 DIAGNOSIS — I10 Essential (primary) hypertension: Secondary | ICD-10-CM

## 2013-03-23 DIAGNOSIS — F411 Generalized anxiety disorder: Secondary | ICD-10-CM

## 2013-03-23 DIAGNOSIS — R7989 Other specified abnormal findings of blood chemistry: Secondary | ICD-10-CM

## 2013-03-23 DIAGNOSIS — C9030 Solitary plasmacytoma not having achieved remission: Secondary | ICD-10-CM

## 2013-03-23 DIAGNOSIS — C9000 Multiple myeloma not having achieved remission: Secondary | ICD-10-CM

## 2013-03-23 DIAGNOSIS — C9001 Multiple myeloma in remission: Secondary | ICD-10-CM

## 2013-03-23 DIAGNOSIS — M545 Low back pain: Secondary | ICD-10-CM

## 2013-03-23 DIAGNOSIS — Z780 Asymptomatic menopausal state: Secondary | ICD-10-CM

## 2013-03-23 DIAGNOSIS — E669 Obesity, unspecified: Secondary | ICD-10-CM

## 2013-03-23 DIAGNOSIS — M899 Disorder of bone, unspecified: Secondary | ICD-10-CM

## 2013-03-23 DIAGNOSIS — S065X9A Traumatic subdural hemorrhage with loss of consciousness of unspecified duration, initial encounter: Secondary | ICD-10-CM

## 2013-03-23 DIAGNOSIS — E860 Dehydration: Secondary | ICD-10-CM

## 2013-03-23 DIAGNOSIS — E785 Hyperlipidemia, unspecified: Secondary | ICD-10-CM

## 2013-03-23 DIAGNOSIS — D649 Anemia, unspecified: Secondary | ICD-10-CM

## 2013-03-23 DIAGNOSIS — N179 Acute kidney failure, unspecified: Secondary | ICD-10-CM

## 2013-03-23 DIAGNOSIS — N63 Unspecified lump in unspecified breast: Secondary | ICD-10-CM

## 2013-03-23 DIAGNOSIS — K5901 Slow transit constipation: Secondary | ICD-10-CM

## 2013-03-23 LAB — BASIC METABOLIC PANEL
CO2: 26 mEq/L (ref 19–32)
Calcium: 9.5 mg/dL (ref 8.4–10.5)
Creatinine, Ser: 1.17 mg/dL — ABNORMAL HIGH (ref 0.50–1.10)
Glucose, Bld: 83 mg/dL (ref 70–99)

## 2013-03-23 MED ORDER — ZOLEDRONIC ACID 4 MG/100ML IV SOLN
4.0000 mg | Freq: Once | INTRAVENOUS | Status: AC
  Administered 2013-03-23: 4 mg via INTRAVENOUS
  Filled 2013-03-23: qty 100

## 2013-03-23 NOTE — Patient Instructions (Signed)
Zoledronic Acid injection (Hypercalcemia, Oncology) What is this medicine? ZOLEDRONIC ACID (ZOE le dron ik AS id) lowers the amount of calcium loss from bone. It is used to treat too much calcium in your blood from cancer. It is also used to prevent complications of cancer that has spread to the bone. This medicine may be used for other purposes; ask your health care provider or pharmacist if you have questions. What should I tell my health care provider before I take this medicine? They need to know if you have any of these conditions: -aspirin-sensitive asthma -dental disease -kidney disease -an unusual or allergic reaction to zoledronic acid, other medicines, foods, dyes, or preservatives -pregnant or trying to get pregnant -breast-feeding How should I use this medicine? This medicine is for infusion into a vein. It is given by a health care professional in a hospital or clinic setting. Talk to your pediatrician regarding the use of this medicine in children. Special care may be needed. Overdosage: If you think you have taken too much of this medicine contact a poison control center or emergency room at once. NOTE: This medicine is only for you. Do not share this medicine with others. What if I miss a dose? It is important not to miss your dose. Call your doctor or health care professional if you are unable to keep an appointment. What may interact with this medicine? -certain antibiotics given by injection -NSAIDs, medicines for pain and inflammation, like ibuprofen or naproxen -some diuretics like bumetanide, furosemide -teriparatide -thalidomide This list may not describe all possible interactions. Give your health care provider a list of all the medicines, herbs, non-prescription drugs, or dietary supplements you use. Also tell them if you smoke, drink alcohol, or use illegal drugs. Some items may interact with your medicine. What should I watch for while using this medicine? Visit  your doctor or health care professional for regular checkups. It may be some time before you see the benefit from this medicine. Do not stop taking your medicine unless your doctor tells you to. Your doctor may order blood tests or other tests to see how you are doing. Women should inform their doctor if they wish to become pregnant or think they might be pregnant. There is a potential for serious side effects to an unborn child. Talk to your health care professional or pharmacist for more information. You should make sure that you get enough calcium and vitamin D while you are taking this medicine. Discuss the foods you eat and the vitamins you take with your health care professional. Some people who take this medicine have severe bone, joint, and/or muscle pain. This medicine may also increase your risk for a broken thigh bone. Tell your doctor right away if you have pain in your upper leg or groin. Tell your doctor if you have any pain that does not go away or that gets worse. What side effects may I notice from receiving this medicine? Side effects that you should report to your doctor or health care professional as soon as possible: -allergic reactions like skin rash, itching or hives, swelling of the face, lips, or tongue -anxiety, confusion, or depression -breathing problems -changes in vision -feeling faint or lightheaded, falls -jaw burning, cramping, pain -muscle cramps, stiffness, or weakness -trouble passing urine or change in the amount of urine Side effects that usually do not require medical attention (report to your doctor or health care professional if they continue or are bothersome): -bone, joint, or muscle pain -  fever -hair loss -irritation at site where injected -loss of appetite -nausea, vomiting -stomach upset -tired This list may not describe all possible side effects. Call your doctor for medical advice about side effects. You may report side effects to FDA at  1-800-FDA-1088. Where should I keep my medicine? This drug is given in a hospital or clinic and will not be stored at home. NOTE: This sheet is a summary. It may not cover all possible information. If you have questions about this medicine, talk to your doctor, pharmacist, or health care provider.  2013, [employer]. (03/13/2011 9:06:58 AM)  

## 2013-04-14 ENCOUNTER — Other Ambulatory Visit: Payer: Self-pay | Admitting: *Deleted

## 2013-04-14 DIAGNOSIS — C9000 Multiple myeloma not having achieved remission: Secondary | ICD-10-CM

## 2013-04-14 MED ORDER — LENALIDOMIDE 15 MG PO CAPS: 15.0000 mg | ORAL_CAPSULE | ORAL | Status: DC

## 2013-04-14 NOTE — Addendum Note (Signed)
Addended by: Arvilla Meres on: 04/14/2013 10:19 AM   Modules accepted: Orders

## 2013-04-14 NOTE — Telephone Encounter (Signed)
THIS REFILL REQUEST FOR REVLIMID WAS PLACED IN DR.HA'S ACTIVE WORK FOLDER. 

## 2013-04-17 ENCOUNTER — Other Ambulatory Visit: Payer: Self-pay | Admitting: Family Medicine

## 2013-04-20 ENCOUNTER — Encounter: Payer: Self-pay | Admitting: *Deleted

## 2013-04-20 NOTE — Progress Notes (Signed)
Fax received from [employer], they shipped pt's Revlimid on 7/23 for delivery today.

## 2013-04-27 ENCOUNTER — Ambulatory Visit (HOSPITAL_BASED_OUTPATIENT_CLINIC_OR_DEPARTMENT_OTHER): Payer: Medicare Other

## 2013-04-27 ENCOUNTER — Encounter: Payer: Self-pay | Admitting: Oncology

## 2013-04-27 ENCOUNTER — Ambulatory Visit (HOSPITAL_BASED_OUTPATIENT_CLINIC_OR_DEPARTMENT_OTHER): Payer: Medicare Other | Admitting: Oncology

## 2013-04-27 ENCOUNTER — Telehealth: Payer: Self-pay | Admitting: *Deleted

## 2013-04-27 ENCOUNTER — Telehealth: Payer: Self-pay | Admitting: Oncology

## 2013-04-27 ENCOUNTER — Other Ambulatory Visit (HOSPITAL_BASED_OUTPATIENT_CLINIC_OR_DEPARTMENT_OTHER): Payer: Medicare Other | Admitting: Lab

## 2013-04-27 VITALS — BP 173/84 | HR 72 | Temp 97.6°F | Resp 20 | Ht 65.0 in | Wt 148.6 lb

## 2013-04-27 DIAGNOSIS — I10 Essential (primary) hypertension: Secondary | ICD-10-CM

## 2013-04-27 DIAGNOSIS — D649 Anemia, unspecified: Secondary | ICD-10-CM

## 2013-04-27 DIAGNOSIS — E860 Dehydration: Secondary | ICD-10-CM

## 2013-04-27 DIAGNOSIS — F411 Generalized anxiety disorder: Secondary | ICD-10-CM

## 2013-04-27 DIAGNOSIS — S065XAA Traumatic subdural hemorrhage with loss of consciousness status unknown, initial encounter: Secondary | ICD-10-CM

## 2013-04-27 DIAGNOSIS — S065X9A Traumatic subdural hemorrhage with loss of consciousness of unspecified duration, initial encounter: Secondary | ICD-10-CM

## 2013-04-27 DIAGNOSIS — R7989 Other specified abnormal findings of blood chemistry: Secondary | ICD-10-CM

## 2013-04-27 DIAGNOSIS — F3289 Other specified depressive episodes: Secondary | ICD-10-CM

## 2013-04-27 DIAGNOSIS — C9001 Multiple myeloma in remission: Secondary | ICD-10-CM

## 2013-04-27 DIAGNOSIS — F329 Major depressive disorder, single episode, unspecified: Secondary | ICD-10-CM

## 2013-04-27 DIAGNOSIS — Z780 Asymptomatic menopausal state: Secondary | ICD-10-CM

## 2013-04-27 DIAGNOSIS — C9000 Multiple myeloma not having achieved remission: Secondary | ICD-10-CM

## 2013-04-27 DIAGNOSIS — G8929 Other chronic pain: Secondary | ICD-10-CM

## 2013-04-27 DIAGNOSIS — M545 Low back pain, unspecified: Secondary | ICD-10-CM

## 2013-04-27 DIAGNOSIS — M25559 Pain in unspecified hip: Secondary | ICD-10-CM

## 2013-04-27 DIAGNOSIS — E669 Obesity, unspecified: Secondary | ICD-10-CM

## 2013-04-27 DIAGNOSIS — N63 Unspecified lump in unspecified breast: Secondary | ICD-10-CM

## 2013-04-27 DIAGNOSIS — G4700 Insomnia, unspecified: Secondary | ICD-10-CM

## 2013-04-27 DIAGNOSIS — C9030 Solitary plasmacytoma not having achieved remission: Secondary | ICD-10-CM

## 2013-04-27 DIAGNOSIS — N179 Acute kidney failure, unspecified: Secondary | ICD-10-CM

## 2013-04-27 DIAGNOSIS — M898X9 Other specified disorders of bone, unspecified site: Secondary | ICD-10-CM

## 2013-04-27 DIAGNOSIS — M899 Disorder of bone, unspecified: Secondary | ICD-10-CM

## 2013-04-27 DIAGNOSIS — K5901 Slow transit constipation: Secondary | ICD-10-CM

## 2013-04-27 DIAGNOSIS — E785 Hyperlipidemia, unspecified: Secondary | ICD-10-CM

## 2013-04-27 LAB — CBC WITH DIFFERENTIAL/PLATELET
Basophils Absolute: 0.1 10*3/uL (ref 0.0–0.1)
Eosinophils Absolute: 0.3 10*3/uL (ref 0.0–0.5)
HGB: 10.5 g/dL — ABNORMAL LOW (ref 11.6–15.9)
MONO#: 0.7 10*3/uL (ref 0.1–0.9)
NEUT#: 1.8 10*3/uL (ref 1.5–6.5)
Platelets: 159 10*3/uL (ref 145–400)
RBC: 3.33 10*6/uL — ABNORMAL LOW (ref 3.70–5.45)
RDW: 14.3 % (ref 11.2–14.5)
WBC: 4.3 10*3/uL (ref 3.9–10.3)
nRBC: 0 % (ref 0–0)

## 2013-04-27 LAB — COMPREHENSIVE METABOLIC PANEL (CC13)
ALT: 16 U/L (ref 0–55)
AST: 19 U/L (ref 5–34)
Albumin: 3.5 g/dL (ref 3.5–5.0)
Alkaline Phosphatase: 46 U/L (ref 40–150)
BUN: 14.7 mg/dL (ref 7.0–26.0)
Chloride: 108 mEq/L (ref 98–109)
Potassium: 4 mEq/L (ref 3.5–5.1)
Sodium: 142 mEq/L (ref 136–145)
Total Protein: 7.3 g/dL (ref 6.4–8.3)

## 2013-04-27 MED ORDER — ZOLEDRONIC ACID 4 MG/100ML IV SOLN
4.0000 mg | Freq: Once | INTRAVENOUS | Status: AC
Start: 2013-04-27 — End: 2013-04-27
  Administered 2013-04-27: 4 mg via INTRAVENOUS
  Filled 2013-04-27: qty 100

## 2013-04-27 NOTE — Telephone Encounter (Signed)
gv and printed appt sched and avs for pt...emailed MW to add tx... °

## 2013-04-27 NOTE — Telephone Encounter (Signed)
Per staff message and POF I have scheduled appts.  JMW  

## 2013-04-27 NOTE — Patient Instructions (Addendum)
Zoledronic Acid injection (Hypercalcemia, Oncology) What is this medicine? ZOLEDRONIC ACID (ZOE le dron ik AS id) lowers the amount of calcium loss from bone. It is used to treat too much calcium in your blood from cancer. It is also used to prevent complications of cancer that has spread to the bone. This medicine may be used for other purposes; ask your health care provider or pharmacist if you have questions. What should I tell my health care provider before I take this medicine? They need to know if you have any of these conditions: -aspirin-sensitive asthma -dental disease -kidney disease -an unusual or allergic reaction to zoledronic acid, other medicines, foods, dyes, or preservatives -pregnant or trying to get pregnant -breast-feeding How should I use this medicine? This medicine is for infusion into a vein. It is given by a health care professional in a hospital or clinic setting. Talk to your pediatrician regarding the use of this medicine in children. Special care may be needed. Overdosage: If you think you have taken too much of this medicine contact a poison control center or emergency room at once. NOTE: This medicine is only for you. Do not share this medicine with others. What if I miss a dose? It is important not to miss your dose. Call your doctor or health care professional if you are unable to keep an appointment. What may interact with this medicine? -certain antibiotics given by injection -NSAIDs, medicines for pain and inflammation, like ibuprofen or naproxen -some diuretics like bumetanide, furosemide -teriparatide -thalidomide This list may not describe all possible interactions. Give your health care provider a list of all the medicines, herbs, non-prescription drugs, or dietary supplements you use. Also tell them if you smoke, drink alcohol, or use illegal drugs. Some items may interact with your medicine. What should I watch for while using this medicine? Visit  your doctor or health care professional for regular checkups. It may be some time before you see the benefit from this medicine. Do not stop taking your medicine unless your doctor tells you to. Your doctor may order blood tests or other tests to see how you are doing. Women should inform their doctor if they wish to become pregnant or think they might be pregnant. There is a potential for serious side effects to an unborn child. Talk to your health care professional or pharmacist for more information. You should make sure that you get enough calcium and vitamin D while you are taking this medicine. Discuss the foods you eat and the vitamins you take with your health care professional. Some people who take this medicine have severe bone, joint, and/or muscle pain. This medicine may also increase your risk for a broken thigh bone. Tell your doctor right away if you have pain in your upper leg or groin. Tell your doctor if you have any pain that does not go away or that gets worse. What side effects may I notice from receiving this medicine? Side effects that you should report to your doctor or health care professional as soon as possible: -allergic reactions like skin rash, itching or hives, swelling of the face, lips, or tongue -anxiety, confusion, or depression -breathing problems -changes in vision -feeling faint or lightheaded, falls -jaw burning, cramping, pain -muscle cramps, stiffness, or weakness -trouble passing urine or change in the amount of urine Side effects that usually do not require medical attention (report to your doctor or health care professional if they continue or are bothersome): -bone, joint, or muscle pain -  fever -hair loss -irritation at site where injected -loss of appetite -nausea, vomiting -stomach upset -tired This list may not describe all possible side effects. Call your doctor for medical advice about side effects. You may report side effects to FDA at  1-800-FDA-1088. Where should I keep my medicine? This drug is given in a hospital or clinic and will not be stored at home. NOTE: This sheet is a summary. It may not cover all possible information. If you have questions about this medicine, talk to your doctor, pharmacist, or health care provider.  2012, [employer]vier/Gold Standard. (03/13/2011 9:06:58 AM) 

## 2013-04-27 NOTE — Progress Notes (Signed)
[facility]  Telephone:(336) [phone] Fax:(336) [phone]   OFFICE PROGRESS NOTE   Cc:  METHENEY,CATHERINE, MD  DIAGNOSIS: history of IgA kappa multiple myeloma. She presented with diffuse lytic bone lesions, renal insufficiency, hypercalcemia, anemia. Her M-spike was 4.06 gm/dL. Her IgA was elevated at 4,020 mg/dL; IgG 353; IgM 5; serum kappa was 2.11; lambda 0.60; serum kappa to lambda ratio elevated at 3.06. Spot urine showed elevated kappa:lambda at 579; and positive for Bence Jones protein. Bone marrow biopsy on 04/08/2012 showed 92% plasma cell. Cytogenetics showed multiple chromosomal abnormalities; additional chromosomal material on chromosome 8p; 14q; loss of chromosome 8 and 13. 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   PAST THERAPY: Started on SQ Velcade 1.3mg /m2 on 04/12/2012; weekly; 3 weeks on; 1 week off. She started on Revlimid 15mg  PO daily d1-21 on 04/21/2012. She is also on Dexamethasone 40mg  PO once weekly. She also received palliative radiation 20 Gy in 8 fractions to the bilateral hips.   CURRENT THERAPY: started maintenance Revlimid 15mg  PO daily d1-21 every 4 weeks in 10/2012.  She has continued on monthly Zometa.    INTERVAL HISTORY: [name] 77 y.o. female returns for regular follow up with her husband for regular follow up. She reports feeling well.  She is now independent of all activities of daily living. She is able to drive her car again.  She has mild, chronic lower back pain, worst in the a.m.; improved as the day progresses.  She takes Tylenol about twice daily with improvement of her back pain.  Pain does not radiate nor does it cause neuropathy, paresthesia, leg weakness, bowel/bladder incontinence.    Patient denies fever, anorexia, weight loss, fatigue, headache, visual changes, confusion, drenching night sweats, palpable lymph node swelling, mucositis, odynophagia, dysphagia, nausea vomiting, jaundice, chest pain, palpitation,  shortness of breath, dyspnea on exertion, productive cough, gum bleeding, epistaxis, hematemesis, hemoptysis, abdominal pain, abdominal swelling, early satiety, melena, hematochezia, hematuria, skin rash, spontaneous bleeding, heat or cold intolerance, bowel bladder incontinence, focal motor weakness, paresthesia, depression.       Past Medical History  Diagnosis Date  . Anxiety   . Hypertension   . Hyperlipidemia   . Depression   . Osteopenia   . Multiple myeloma 03/2012  . Acute renal failure      due to hypercalcemia and suspected multiple myeloma resolved =with IVF's    . Lytic bone lesions on xray   . Acute subdural hematoma 03/2012     recent fall in hospital stay,resolved+  . Normocytic anemia     due to chemo and multiple myeloma,no active bleding  . Cancer     mul;tiple myeloma, bone lesions r 7th rib,   . Arthritis     spine  . Atrial fibrillation   . Radiation 05/09/12-05/18/12    20 gray in 8 Fx's right hip palliative  . Status post chemotherapy     velcade and revlimid    Past Surgical History  Procedure Laterality Date  . Breast lumpectomy    . 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 Tonsillectomy      age 46    Current Outpatient Prescriptions  Medication Sig Dispense Refill  . acetaminophen (TYLENOL) 500 MG tablet Take 500-1,000 mg by mouth every 4 (four) hours as needed. No more than 6 tabs in one day.      . ALPRAZolam (XANAX) 1 MG  tablet TAKE 1/2 TO 1 TABLET DAILY AS NEEDED FOR SLEEP OR ANXIETY.  30 tablet  1  . CRESTOR 10 MG tablet TAKE 1 TABLET AT BEDTIME.  30 tablet  3  . erythromycin ophthalmic ointment Place into the left eye every 6 (six) hours. X 1 week.  3.5 g  0  . lenalidomide (REVLIMID) 15 MG capsule Take 1 capsule (15 mg total) by mouth See admin instructions. Daily for 21 days, then 7 days off  21 capsule  0  . losartan (COZAAR) 50 MG tablet TAKE 1  TABLET DAILY.  30 tablet  1  . Multiple Vitamin (MULTIVITAMIN WITH MINERALS) TABS Take 1 tablet by mouth daily.      . ondansetron (ZOFRAN) 4 MG tablet Take 4 mg by mouth every 4 (four) hours as needed for nausea. Take 1 tablet every 8 hours as needed for nausea      . potassium chloride SA (K-DUR,KLOR-CON) 20 MEQ tablet Take 20 mEq by mouth daily.      . prochlorperazine (COMPAZINE) 10 MG tablet Take 10 mg by mouth every 6 (six) hours as needed. Take 1 tablet every 6 hours as directed       No current facility-administered medications for this visit.    ALLERGIES:  is allergic to simvastatin.  REVIEW OF SYSTEMS:  The rest of the 14-point review of system was negative.   Filed Vitals:   04/27/13 1115  BP: 173/84  Pulse: 72  Temp: 97.6 F (36.4 C)  Resp: 20   Wt Readings from Last 3 Encounters:  04/27/13 148 lb 9.6 oz (67.405 kg)  02/23/13 154 lb 11.2 oz (70.171 kg)  01/18/13 152 lb (68.947 kg)   ECOG Performance status: 0-1  PHYSICAL EXAMINATION:   General:  well-nourished woman, in no acute distress.  Eyes:  no scleral icterus.  ENT:  There were no oropharyngeal lesions.  Neck was without thyromegaly.  Lymphatics:  Negative cervical, supraclavicular or axillary adenopathy.  Respiratory: lungs were clear bilaterally without wheezing or crackles.  Cardiovascular:  Regular rate and rhythm, S1/S2, without murmur, rub or gallop.  There was no pedal edema.  GI:  abdomen was soft, flat, nontender, nondistended, without organomegaly.  Muscoloskeletal:  no spinal tenderness of palpation of vertebral spine.  Skin exam was without echymosis, petichae.  Neuro exam was nonfocal.  Patient was able to get on and off exam table without assistance.  Gait was normal.  She ambulates without a walker or cane.   Patient was alert and oriented.  Attention was good.   Language was appropriate.  Mood was normal without depression.  Speech was not pressured.  Thought content was not tangential.      LABORATORY/RADIOLOGY DATA:  Lab Results  Component Value Date   WBC 4.3 04/27/2013   HGB 10.5* 04/27/2013   HCT 31.3* 04/27/2013   PLT 159 04/27/2013   GLUCOSE 89 04/27/2013   CHOL 255* 03/03/2012   TRIG 117 03/03/2012   HDL 62 03/03/2012   LDLDIRECT 166* 04/24/2011   LDLCALC 170* 03/03/2012   ALKPHOS 46 04/27/2013   ALT 16 04/27/2013   AST 19 04/27/2013   NA 142 04/27/2013   K 4.0 04/27/2013   CL 105 03/23/2013   CREATININE 1.3* 04/27/2013   BUN 14.7 04/27/2013   CO2 25 04/27/2013   INR 1.10* 11/01/2012   HGBA1C 6.6* 05/03/2012     ASSESSMENT AND PLAN:   1. Hypertension:  on losartan.  2. Hyperlipidemia: on Crestor.  3. Lytic  bone lesions causing bone pain: much improved with radiation. I will continue with monthly Zometa for 2 years total until 03/2014. She needs Tylenol sparingly.  She has not had complication with Zometa.  4. Mild chronic normocytic anemia: due to Revlimid. This is stable.   5. Multiple myeloma:  She is in remission.   -She is doing well on maintenance Revlimid without side effect.  I advised her to continue this for now. She was deemed not to be candidate for BMT by [facility].  6. Follow up: in one month for lab and Zometa.  Return visit in about 2 months.

## 2013-05-01 LAB — PROTEIN ELECTROPHORESIS, SERUM
Albumin ELP: 52.3 % — ABNORMAL LOW (ref 55.8–66.1)
Alpha-1-Globulin: 5.5 % — ABNORMAL HIGH (ref 2.9–4.9)
Alpha-2-Globulin: 12.8 % — ABNORMAL HIGH (ref 7.1–11.8)
Beta Globulin: 8.4 % — ABNORMAL HIGH (ref 4.7–7.2)
Total Protein, Serum Electrophoresis: 6.9 g/dL (ref 6.0–8.3)

## 2013-05-01 LAB — KAPPA/LAMBDA LIGHT CHAINS
Kappa free light chain: 3.56 mg/dL — ABNORMAL HIGH (ref 0.33–1.94)
Kappa:Lambda Ratio: 1.91 — ABNORMAL HIGH (ref 0.26–1.65)
Lambda Free Lght Chn: 1.86 mg/dL (ref 0.57–2.63)

## 2013-05-04 ENCOUNTER — Encounter: Payer: Self-pay | Admitting: Hematology and Oncology

## 2013-05-04 NOTE — Progress Notes (Signed)
Per Sallye Ober patient had left message about billing. I called the patient and advised her all billing questions must go to them.--

## 2013-05-12 ENCOUNTER — Other Ambulatory Visit: Payer: Self-pay | Admitting: *Deleted

## 2013-05-12 DIAGNOSIS — C9000 Multiple myeloma not having achieved remission: Secondary | ICD-10-CM

## 2013-05-12 MED ORDER — LENALIDOMIDE 15 MG PO CAPS: 15.0000 mg | ORAL_CAPSULE | ORAL | Status: DC

## 2013-05-12 NOTE — Addendum Note (Signed)
Addended by: Arvilla Meres on: 05/12/2013 04:48 PM   Modules accepted: Orders

## 2013-05-12 NOTE — Telephone Encounter (Signed)
THIS REFILL REQUEST FOR REVLIMID WAS GIVEN TO DR.ROSTAPSHOV'S NURSE, CAMEO PORTER,RN.

## 2013-05-15 NOTE — Telephone Encounter (Signed)
RECEIVED A FAX FROM [facility] CONCERNING A CONFIRMATION OF FACSIMILE RECEIPT FOR PT.'S REFERRAL. 

## 2013-05-16 NOTE — Telephone Encounter (Signed)
RECEIVED A FAX FROM [facility] CONCERNING A CONFIRMATION OF PRESCRIPTION SHIPMENT FOR REVLIMID ON 05/15/13. 

## 2013-05-17 ENCOUNTER — Other Ambulatory Visit: Payer: Self-pay

## 2013-05-17 ENCOUNTER — Other Ambulatory Visit: Payer: Self-pay | Admitting: Family Medicine

## 2013-05-23 ENCOUNTER — Ambulatory Visit (INDEPENDENT_AMBULATORY_CARE_PROVIDER_SITE_OTHER): Payer: Medicare Other

## 2013-05-23 DIAGNOSIS — Z1231 Encounter for screening mammogram for malignant neoplasm of breast: Secondary | ICD-10-CM

## 2013-05-23 DIAGNOSIS — Z139 Encounter for screening, unspecified: Secondary | ICD-10-CM

## 2013-05-25 ENCOUNTER — Ambulatory Visit (HOSPITAL_BASED_OUTPATIENT_CLINIC_OR_DEPARTMENT_OTHER): Payer: Medicare Other

## 2013-05-25 ENCOUNTER — Other Ambulatory Visit (HOSPITAL_BASED_OUTPATIENT_CLINIC_OR_DEPARTMENT_OTHER): Payer: Medicare Other | Admitting: Lab

## 2013-05-25 ENCOUNTER — Other Ambulatory Visit: Payer: Self-pay | Admitting: Oncology

## 2013-05-25 VITALS — BP 131/80 | HR 68 | Temp 97.6°F | Resp 18

## 2013-05-25 DIAGNOSIS — M545 Low back pain, unspecified: Secondary | ICD-10-CM

## 2013-05-25 DIAGNOSIS — S065X9A Traumatic subdural hemorrhage with loss of consciousness of unspecified duration, initial encounter: Secondary | ICD-10-CM

## 2013-05-25 DIAGNOSIS — M899 Disorder of bone, unspecified: Secondary | ICD-10-CM

## 2013-05-25 DIAGNOSIS — C9001 Multiple myeloma in remission: Secondary | ICD-10-CM

## 2013-05-25 DIAGNOSIS — N179 Acute kidney failure, unspecified: Secondary | ICD-10-CM

## 2013-05-25 DIAGNOSIS — C9030 Solitary plasmacytoma not having achieved remission: Secondary | ICD-10-CM

## 2013-05-25 DIAGNOSIS — M25559 Pain in unspecified hip: Secondary | ICD-10-CM

## 2013-05-25 DIAGNOSIS — G4700 Insomnia, unspecified: Secondary | ICD-10-CM

## 2013-05-25 DIAGNOSIS — F329 Major depressive disorder, single episode, unspecified: Secondary | ICD-10-CM

## 2013-05-25 DIAGNOSIS — I10 Essential (primary) hypertension: Secondary | ICD-10-CM

## 2013-05-25 DIAGNOSIS — C9000 Multiple myeloma not having achieved remission: Secondary | ICD-10-CM

## 2013-05-25 DIAGNOSIS — N63 Unspecified lump in unspecified breast: Secondary | ICD-10-CM

## 2013-05-25 DIAGNOSIS — E785 Hyperlipidemia, unspecified: Secondary | ICD-10-CM

## 2013-05-25 DIAGNOSIS — K5901 Slow transit constipation: Secondary | ICD-10-CM

## 2013-05-25 DIAGNOSIS — R7989 Other specified abnormal findings of blood chemistry: Secondary | ICD-10-CM

## 2013-05-25 DIAGNOSIS — E669 Obesity, unspecified: Secondary | ICD-10-CM

## 2013-05-25 DIAGNOSIS — D649 Anemia, unspecified: Secondary | ICD-10-CM

## 2013-05-25 DIAGNOSIS — S065XAA Traumatic subdural hemorrhage with loss of consciousness status unknown, initial encounter: Secondary | ICD-10-CM

## 2013-05-25 DIAGNOSIS — F3289 Other specified depressive episodes: Secondary | ICD-10-CM

## 2013-05-25 DIAGNOSIS — Z780 Asymptomatic menopausal state: Secondary | ICD-10-CM

## 2013-05-25 DIAGNOSIS — M898X9 Other specified disorders of bone, unspecified site: Secondary | ICD-10-CM

## 2013-05-25 DIAGNOSIS — E860 Dehydration: Secondary | ICD-10-CM

## 2013-05-25 DIAGNOSIS — F411 Generalized anxiety disorder: Secondary | ICD-10-CM

## 2013-05-25 LAB — COMPREHENSIVE METABOLIC PANEL (CC13)
ALT: 14 U/L (ref 0–55)
CO2: 24 mEq/L (ref 22–29)
Calcium: 9.4 mg/dL (ref 8.4–10.4)
Chloride: 107 mEq/L (ref 98–109)
Creatinine: 1.5 mg/dL — ABNORMAL HIGH (ref 0.6–1.1)
Glucose: 130 mg/dl (ref 70–140)
Sodium: 141 mEq/L (ref 136–145)
Total Protein: 6.6 g/dL (ref 6.4–8.3)

## 2013-05-25 MED ORDER — SODIUM CHLORIDE 0.9 % IJ SOLN
3.0000 mL | Freq: Once | INTRAMUSCULAR | Status: DC | PRN
  Filled 2013-05-25: qty 10

## 2013-05-25 MED ORDER — SODIUM CHLORIDE 0.9 % IV SOLN
4.0000 mg | Freq: Once | INTRAVENOUS | Status: AC
  Administered 2013-05-25: 4 mg via INTRAVENOUS
  Filled 2013-05-25: qty 5

## 2013-05-25 MED ORDER — SODIUM CHLORIDE 0.9 % IV SOLN
Freq: Once | INTRAVENOUS | Status: AC
  Administered 2013-05-25: 12:00:00 via INTRAVENOUS

## 2013-05-25 MED ORDER — HEPARIN SOD (PORK) LOCK FLUSH 100 UNIT/ML IV SOLN
500.0000 [IU] | Freq: Once | INTRAVENOUS | Status: DC | PRN
  Filled 2013-05-25: qty 5

## 2013-05-25 NOTE — Patient Instructions (Signed)
Zoledronic Acid injection (Hypercalcemia, Oncology) What is this medicine? ZOLEDRONIC ACID (ZOE le dron ik AS id) lowers the amount of calcium loss from bone. It is used to treat too much calcium in your blood from cancer. It is also used to prevent complications of cancer that has spread to the bone. This medicine may be used for other purposes; ask your health care provider or pharmacist if you have questions. What should I tell my health care provider before I take this medicine? They need to know if you have any of these conditions: -aspirin-sensitive asthma -dental disease -kidney disease -an unusual or allergic reaction to zoledronic acid, other medicines, foods, dyes, or preservatives -pregnant or trying to get pregnant -breast-feeding How should I use this medicine? This medicine is for infusion into a vein. It is given by a health care professional in a hospital or clinic setting. Talk to your pediatrician regarding the use of this medicine in children. Special care may be needed. Overdosage: If you think you have taken too much of this medicine contact a poison control center or emergency room at once. NOTE: This medicine is only for you. Do not share this medicine with others. What if I miss a dose? It is important not to miss your dose. Call your doctor or health care professional if you are unable to keep an appointment. What may interact with this medicine? -certain antibiotics given by injection -NSAIDs, medicines for pain and inflammation, like ibuprofen or naproxen -some diuretics like bumetanide, furosemide -teriparatide -thalidomide This list may not describe all possible interactions. Give your health care provider a list of all the medicines, herbs, non-prescription drugs, or dietary supplements you use. Also tell them if you smoke, drink alcohol, or use illegal drugs. Some items may interact with your medicine. What should I watch for while using this medicine? Visit  your doctor or health care professional for regular checkups. It may be some time before you see the benefit from this medicine. Do not stop taking your medicine unless your doctor tells you to. Your doctor may order blood tests or other tests to see how you are doing. Women should inform their doctor if they wish to become pregnant or think they might be pregnant. There is a potential for serious side effects to an unborn child. Talk to your health care professional or pharmacist for more information. You should make sure that you get enough calcium and vitamin D while you are taking this medicine. Discuss the foods you eat and the vitamins you take with your health care professional. Some people who take this medicine have severe bone, joint, and/or muscle pain. This medicine may also increase your risk for a broken thigh bone. Tell your doctor right away if you have pain in your upper leg or groin. Tell your doctor if you have any pain that does not go away or that gets worse. What side effects may I notice from receiving this medicine? Side effects that you should report to your doctor or health care professional as soon as possible: -allergic reactions like skin rash, itching or hives, swelling of the face, lips, or tongue -anxiety, confusion, or depression -breathing problems -changes in vision -feeling faint or lightheaded, falls -jaw burning, cramping, pain -muscle cramps, stiffness, or weakness -trouble passing urine or change in the amount of urine Side effects that usually do not require medical attention (report to your doctor or health care professional if they continue or are bothersome): -bone, joint, or muscle pain -  fever -hair loss -irritation at site where injected -loss of appetite -nausea, vomiting -stomach upset -tired This list may not describe all possible side effects. Call your doctor for medical advice about side effects. You may report side effects to FDA at  1-800-FDA-1088. Where should I keep my medicine? This drug is given in a hospital or clinic and will not be stored at home. NOTE: This sheet is a summary. It may not cover all possible information. If you have questions about this medicine, talk to your doctor, pharmacist, or health care provider.  2013, [employer]. (03/13/2011 9:06:58 AM)  

## 2013-05-25 NOTE — Progress Notes (Signed)
Today's labs reviewed with Ms Curcio.  Patient reports she has days when she doesn't have to empty her bladder as much and days when she can't get there fast enough.  Goes to the bathroom sometimes 3-4 times through the night.  Denies drinking enough water.  Drinks diet pepsi.  Denies burning, pain, hesistancy or any further problems with urination.  Orders received for patient to force fluids and proceed with today's zometa.   Patient drank three 12 oz cups of water while here today and reports she will drink more water at home.

## 2013-05-25 NOTE — Progress Notes (Signed)
Discharged at 1320 with spouse in no distress.

## 2013-06-09 ENCOUNTER — Other Ambulatory Visit: Payer: Self-pay | Admitting: *Deleted

## 2013-06-09 NOTE — Telephone Encounter (Signed)
THIS REFILL REQUEST FOR REVLIMID WAS GIVEN TO DR.ROSTAPSHOV'S NURSE, CAMEO PORTER,RN. 

## 2013-06-14 ENCOUNTER — Other Ambulatory Visit: Payer: Self-pay | Admitting: *Deleted

## 2013-06-14 ENCOUNTER — Telehealth: Payer: Self-pay | Admitting: *Deleted

## 2013-06-14 ENCOUNTER — Other Ambulatory Visit: Payer: Self-pay | Admitting: Family Medicine

## 2013-06-14 NOTE — Telephone Encounter (Signed)
Pt left VM this morning states she has not heard about her Revlimid refill and is due to start next cycle on 9/20.  Refill request given to Dr. Bertis Ruddy for review and signature.  Informed pt we will send it as soon as Dr. Bertis Ruddy is able to review and sign.  She verbalized understanding.

## 2013-06-15 ENCOUNTER — Other Ambulatory Visit: Payer: Self-pay | Admitting: Family Medicine

## 2013-06-15 ENCOUNTER — Other Ambulatory Visit: Payer: Self-pay | Admitting: *Deleted

## 2013-06-15 DIAGNOSIS — C9000 Multiple myeloma not having achieved remission: Secondary | ICD-10-CM

## 2013-06-15 MED ORDER — LENALIDOMIDE 15 MG PO CAPS: 15.0000 mg | ORAL_CAPSULE | ORAL | Status: DC

## 2013-06-16 ENCOUNTER — Telehealth: Payer: Self-pay | Admitting: *Deleted

## 2013-06-16 NOTE — Telephone Encounter (Signed)
sw pt gv appts for 06/22/13 w/labs@ 9:30am, ov@ 10am, and tx @ 11:30am. Pt is aware...td

## 2013-06-22 ENCOUNTER — Encounter: Payer: Self-pay | Admitting: Family

## 2013-06-22 ENCOUNTER — Ambulatory Visit (HOSPITAL_BASED_OUTPATIENT_CLINIC_OR_DEPARTMENT_OTHER): Payer: Medicare Other | Admitting: Family

## 2013-06-22 ENCOUNTER — Other Ambulatory Visit (HOSPITAL_BASED_OUTPATIENT_CLINIC_OR_DEPARTMENT_OTHER): Payer: Medicare Other | Admitting: Lab

## 2013-06-22 ENCOUNTER — Other Ambulatory Visit: Payer: Medicare Other | Admitting: Lab

## 2013-06-22 ENCOUNTER — Ambulatory Visit: Payer: Medicare Other

## 2013-06-22 ENCOUNTER — Telehealth: Payer: Self-pay | Admitting: Hematology and Oncology

## 2013-06-22 VITALS — BP 155/87 | HR 77 | Temp 97.9°F | Resp 20 | Ht 65.0 in | Wt 150.6 lb

## 2013-06-22 DIAGNOSIS — C9001 Multiple myeloma in remission: Secondary | ICD-10-CM

## 2013-06-22 DIAGNOSIS — C9000 Multiple myeloma not having achieved remission: Secondary | ICD-10-CM

## 2013-06-22 LAB — CBC WITH DIFFERENTIAL/PLATELET
BASO%: 2 % (ref 0.0–2.0)
HCT: 28.5 % — ABNORMAL LOW (ref 34.8–46.6)
LYMPH%: 34 % (ref 14.0–49.7)
MCH: 31.8 pg (ref 25.1–34.0)
MCHC: 33 g/dL (ref 31.5–36.0)
MCV: 96.3 fL (ref 79.5–101.0)
MONO#: 0.7 10*3/uL (ref 0.1–0.9)
NEUT%: 40.5 % (ref 38.4–76.8)
Platelets: 130 10*3/uL — ABNORMAL LOW (ref 145–400)
WBC: 3.9 10*3/uL (ref 3.9–10.3)

## 2013-06-22 NOTE — Telephone Encounter (Signed)
, °

## 2013-06-22 NOTE — Patient Instructions (Addendum)
Please contact us at [phone] if you have any questions or concerns.  Please continue to do well and enjoy life!!!  Get plenty of rest, drink plenty of water, exercise daily (walking as tolerated), eat a balanced diet.  Take Vitamin D3 1000 IUs daily and Calcium (Gummies or Chews) - take calcium twice daily (at different times).   No dental procedures until after Thanksgiving.   Your hematologist is Dr. Artis Delay, MD.   Results for orders placed in visit on 06/22/13 (from the past 24 hour(s))  CBC WITH DIFFERENTIAL     Status: Abnormal   Collection Time    06/22/13  9:12 AM      Result Value Range   WBC 3.9  3.9 - 10.3 10e3/uL   NEUT# 1.6  1.5 - 6.5 10e3/uL   HGB 9.4 (*) 11.6 - 15.9 g/dL   HCT 78.4 (*) 69.6 - 29.5 %   Platelets 130 (*) 145 - 400 10e3/uL   MCV 96.3  79.5 - 101.0 fL   MCH 31.8  25.1 - 34.0 pg   MCHC 33.0  31.5 - 36.0 g/dL   RBC 2.84 (*) 1.32 - 4.40 10e6/uL   RDW 14.8 (*) 11.2 - 14.5 %   lymph# 1.3  0.9 - 3.3 10e3/uL   MONO# 0.7  0.1 - 0.9 10e3/uL   Eosinophils Absolute 0.3  0.0 - 0.5 10e3/uL   Basophils Absolute 0.1  0.0 - 0.1 10e3/uL   NEUT% 40.5  38.4 - 76.8 %   LYMPH% 34.0  14.0 - 49.7 %   MONO% 16.6 (*) 0.0 - 14.0 %   EOS% 6.9  0.0 - 7.0 %   BASO% 2.0  0.0 - 2.0 %   nRBC 0  0 - 0 %   Narrative:    Performed At:  [facility]               [address]               [place], [place] [address]

## 2013-06-22 NOTE — Progress Notes (Signed)
[facility]  [phone] [phone]  OFFICE PROGRESS NOTE   Patient ID: [name], female   DOB: Sep 07, 1936, 77 y.o.  XBJ:[id number]      HYQ:[id number]   PCP: [name], MD   Diagnosis: RENATE DANH is a 77 y.o. female with history of IgA kappa multiple myeloma. She presented to the ER on 04/03/2012 complaining of lower back pain since 09/2011, in addition to chronic bilateral hip pain, felt initially to be secondary to muscular etiology, controlled with muscle relaxers and NSAIDs. However, over the 3 months prior her pain had become increasingly worse, complicated with urinary incontinence and decreased ambulation due to weakness in her lower extremities.   Although her lumbar spine x-ray showed only chronic and degenerative changes and no acute bony pathology was seen, MRI of the L spine without contrast was performed, revealing innumerable lesions throughout the entire visualized portion of the skeleton and sacrum. No significant neural impingement in the lumbar spine.  CXR on 04/04/2012 showed a mass-like opacity in the right hemithorax, may be pleural based versus extrapleural. Workup for multiple myeloma was initiated based on diffuse lytic bone lesions, renal insufficiency, hypercalcemia, anemia.  Laboratories resulted on 04/04/2012 showed an M-spike was 4.06 gm/dL. Her IgA was elevated at 4,020 mg/dL; IgG 353; IgM 5; serum kappa was 2.11; lambda 0.60; serum kappa to lambda ratio elevated at 3.06. Spot urine showed elevated kappa:lambda at 579; and positive for Bence Jones protein. Bone marrow biopsy on 04/08/2012 showed 92% plasma cell. Cytogenetics showed multiple chromosomal abnormalities; additional chromosomal material on chromosome 8p; 14q; loss of chromosome 8 and 13. 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    Prior Therapy: Started on subcutaneous Velcade 1.3mg /m2 on 04/12/2012; weekly; 3 weeks on; 1 week off. She started on  Revlimid 15mg  PO Fletcher days 1-21 on 04/21/2012. She is also on Dexamethasone 40mg  PO once weekly. She received palliative radiation 20 Gy in 8 fractions to the bilateral hips from 05/09/2012 through 05/18/2012.   Current Therapy: She started maintenance Revlimid 15mg  PO Fletcher days 1-21 every 4 weeks in 10/2012. She has continued on monthly Zometa.   Interval History: [name] was seen today by Dr. Bertis Ruddy and I for followup of IgA kappa multiple myeloma.  [name] is accompanied for today's office visit by her husband Allante Beane.  Since her last office visit on 04/27/2013, [name] notes dental issues (she presents with missing front dentition) and states she needs a note from our office addressed to her dentist stating she can have tooth extractions.  She also reports ongoing constipation and chronic back pain that has persisted for over one year that is relieved when she takes NSAIDs.  She notes a tremendous improvement in her ambulation stating she went from bed bound, to ambulating with a walker, then ambulating with a cane, and currently ambulates unassisted. She is establishing herself with Dr. Maxine Glenn service today.  Medical History: Past Medical History  Diagnosis Date  . Anxiety   . Hypertension   . Hyperlipidemia   . Depression   . Osteopenia   . Multiple myeloma 03/2012  . Acute renal failure      due to hypercalcemia and suspected multiple myeloma resolved =with IVF's    . Lytic bone lesions on xray   . Acute subdural hematoma 03/2012     recent fall in hospital stay,resolved+  . Normocytic anemia     due to chemo and multiple myeloma,no active bleding  .  Cancer     mul;tiple myeloma, bone lesions r 7th rib,   . Arthritis     spine  . Atrial fibrillation   . Radiation 05/09/12-05/18/12    20 gray in 8 Fx's right hip palliative  . Status post chemotherapy     velcade and revlimid    Surgical History: Past Surgical History  Procedure Laterality Date  . Breast  lumpectomy    . 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 Tonsillectomy      age 6    Family History: Family History  Problem Relation Age of Onset  . Heart disease Mother   . Hyperlipidemia Sister   . Hypertension Sister   . Diabetes Sister   . Diabetes Sister   . Hyperlipidemia Sister   . Hypertension Sister   . Sudden death Neg Hx   . Heart attack Neg Hx     Social History: History  Substance Use Topics  . Smoking status: Never Smoker   . Smokeless tobacco: Never Used  . Alcohol Use: No    Allergies: Allergies  Allergen Reactions  . Simvastatin Other (See Comments)    Unsure, Joint aches listed previously     Current Medications: Current Outpatient Prescriptions  Medication Sig Dispense Refill  . acetaminophen (TYLENOL) 500 MG tablet Take 500-1,000 mg by mouth every 4 (four) hours as needed. No more than 6 tabs in one day.      . ALPRAZolam (XANAX) 1 MG tablet TAKE 1/2 TO 1 TABLET Fletcher AS NEEDED FOR SLEEP OR ANXIETY.  30 tablet  0  . CRESTOR 10 MG tablet TAKE 1 TABLET AT BEDTIME.  30 tablet  3  . lenalidomide (REVLIMID) 15 MG capsule Take 1 capsule (15 mg total) by mouth See admin instructions. Fletcher for 21 days, then 7 days off  21 capsule  0  . losartan (COZAAR) 50 MG tablet TAKE 1 TABLET Fletcher.  30 tablet  3  . Multiple Vitamin (MULTIVITAMIN WITH MINERALS) TABS Take 1 tablet by mouth Fletcher.      . ondansetron (ZOFRAN) 4 MG tablet Take 4 mg by mouth every 4 (four) hours as needed for nausea. Take 1 tablet every 8 hours as needed for nausea      . potassium chloride SA (K-DUR,KLOR-CON) 20 MEQ tablet Take 20 mEq by mouth Fletcher.      . potassium chloride SA (K-DUR,KLOR-CON) 20 MEQ tablet TAKE 1 TABLET Fletcher.  30 tablet  3  . prochlorperazine (COMPAZINE) 10 MG tablet Take 10 mg by mouth every 6 (six) hours as needed. Take 1 tablet every 6 hours as directed       No  current facility-administered medications for this visit.     Review of Systems: 10 Point review of systems was completed and is negative except as noted above.  [name] specifically denies any other symptomatology including fatigue, fever or chills, headache, vision changes, swollen glands, cough or shortness of breath, chest pain or discomfort, nausea, vomiting, diarrhea, change in urinary or bowel habits, other arthralgias/myalgias, unusual bleeding/bruising or any other symptomatology.   ECOG Performance Status: ECOG FS: 1 - Symptomatic but completely ambulatory   Physical Exam:   Blood pressure 155/87, pulse 77, temperature 97.9 F (36.6 C), temperature source Oral, resp. rate 20, height 5\' 5"  (1.651 m), weight 150 lb 9.6 oz (68.312 kg).  General appearance: Alert, cooperative, well nourished, no apparent distress Head: Normocephalic, without  obvious abnormality, atraumatic, missing front dentition Eyes: Arcus senilis, PERRLA, EOMI Nose: Nares, septum and mucosa are normal, no drainage or sinus tenderness Neck: No adenopathy, supple, symmetrical, trachea midline, no tenderness Resp: Clear to auscultation bilaterally, no wheezes/rales/rhonchi Cardio: Regular rate and rhythm, S1, S2 normal, no murmur, click, rub or gallop, no edema GI: Soft, distended, non-tender, hypoactive bowel sounds, no organomegaly Skin: No rashes/lesions, skin warm and dry, bilateral upper extremity ecchymosis in various stages of healing, no cyanosis  M/S:  Atraumatic, limited upper extremity and lower extremity range of motion, 3/5 strength in all extremities, no clubbing  Lymph nodes: Cervical, supraclavicular, and axillary nodes normal Neurologic: Grossly normal, cranial nerves II through XII intact, alert and oriented x 3 Psych: Appropriate affect    Laboratory Data: Results for orders placed in visit on 06/22/13 (from the past 48 hour(s))  CBC WITH DIFFERENTIAL     Status: Abnormal   Collection  Time    06/22/13  9:12 AM      Result Value Range   WBC 3.9  3.9 - 10.3 10e3/uL   NEUT# 1.6  1.5 - 6.5 10e3/uL   HGB 9.4 (*) 11.6 - 15.9 g/dL   HCT 45.4 (*) 09.8 - 11.9 %   Platelets 130 (*) 145 - 400 10e3/uL   MCV 96.3  79.5 - 101.0 fL   MCH 31.8  25.1 - 34.0 pg   MCHC 33.0  31.5 - 36.0 g/dL   RBC 1.47 (*) 8.29 - 5.62 10e6/uL   RDW 14.8 (*) 11.2 - 14.5 %   lymph# 1.3  0.9 - 3.3 10e3/uL   MONO# 0.7  0.1 - 0.9 10e3/uL   Eosinophils Absolute 0.3  0.0 - 0.5 10e3/uL   Basophils Absolute 0.1  0.0 - 0.1 10e3/uL   NEUT% 40.5  38.4 - 76.8 %   LYMPH% 34.0  14.0 - 49.7 %   MONO% 16.6 (*) 0.0 - 14.0 %   EOS% 6.9  0.0 - 7.0 %   BASO% 2.0  0.0 - 2.0 %   nRBC 0  0 - 0 %     Imaging Studies: No results found.    Assessment/Plan: 1.  History of IgA kappa myeloma:  Currently in remission. Lytic bone lesions causing bone pain has improved with radiation. Monthly Zometa will be discontinued at this time to enable [name] to pursue tooth extractions/dental procedures with her dentist in 07/2013 after Thanksgiving. She is doing well on maintenance Revlimid without side effect and will continue taking 21 days on and 7 days off.  Her latest cycle of Revlimid started on 06/17/2013.  [name] was asked to take vitamin D3 1000 IUs Fletcher, take calcium Gummies or Viactiv chews twice Fletcher,  in addition to walking Fletcher (as tolerated) to strengthen her bones.   2. Mild chronic normocytic anemia: Anemia secondary to Revlimid. This is stable.    3.  She was deemed not to be candidate for BMT by [facility].   4. Follow up:  In one month for laboratories and assessment.  [name] has already received her annual influenza vaccination.   Larina Bras, NP-C 06/23/2013, 9:23 AM

## 2013-06-23 ENCOUNTER — Encounter: Payer: Self-pay | Admitting: Family

## 2013-06-23 NOTE — Progress Notes (Signed)
I have seen the patient, examined her and agreed with the assessment and plan as outlined by my NP. The patient is instructed to take calcium and vitamin D supplement while on treatment and we are holding Zometa now, in anticipation for future tooth extraction. She will continue Revlimid in the mean time. I will see her next month for further follow-up.

## 2013-06-26 ENCOUNTER — Encounter: Payer: Self-pay | Admitting: Family Medicine

## 2013-06-26 ENCOUNTER — Ambulatory Visit (INDEPENDENT_AMBULATORY_CARE_PROVIDER_SITE_OTHER): Payer: Medicare Other | Admitting: Family Medicine

## 2013-06-26 VITALS — BP 123/64 | HR 79 | Wt 152.0 lb

## 2013-06-26 DIAGNOSIS — E559 Vitamin D deficiency, unspecified: Secondary | ICD-10-CM

## 2013-06-26 DIAGNOSIS — R7989 Other specified abnormal findings of blood chemistry: Secondary | ICD-10-CM

## 2013-06-26 DIAGNOSIS — Z79899 Other long term (current) drug therapy: Secondary | ICD-10-CM

## 2013-06-26 DIAGNOSIS — R799 Abnormal finding of blood chemistry, unspecified: Secondary | ICD-10-CM

## 2013-06-26 DIAGNOSIS — I10 Essential (primary) hypertension: Secondary | ICD-10-CM

## 2013-06-26 DIAGNOSIS — C9000 Multiple myeloma not having achieved remission: Secondary | ICD-10-CM

## 2013-06-26 NOTE — Progress Notes (Signed)
  Subjective:    Patient ID: [name], female    DOB: December 31, 1935, 77 y.o.   MRN: [id number]  HPI She has new oncologist and they have stopped her zometa injections for her multiple myeloma. They also thought she was on too much potassium. Evidently she has been taking the prescription and potassium that I give her and her husband has also been buying her an over-the-counter potassium supplement. She recently had lapsed on and was told that her potassium level was normal. For our records there is potassium for about 3 months ago that was normal. He has questions about her calcium as well. Has been taking calcium 500mg  with Vit D 800 IU BID and has started a calcium gummie vitamin.     Overall she's been doing okay. She had a fair amount of back discomfort yesterday it actually feels a little better today. She also recently had a mammogram.  Hypertension-she is on fantastic. With her significant weight loss after treatment for her multiple myeloma her blood pressure came down beautifully. We have been able to significantly reduce the amount of blood pressure medications that she takes. Continue losartan 50 mg it seems to be working well and she is tolerating it well without any side effects. No chest pain, shortness of breath, palpitations, swelling Review of Systems     Objective:   Physical Exam  Constitutional: She is oriented to person, place, and time. She appears well-developed and well-nourished.  HENT:  Head: Normocephalic and atraumatic.  Cardiovascular: Normal rate, regular rhythm and normal heart sounds.   2/6SEM  Pulmonary/Chest: Effort normal and breath sounds normal.  Neurological: She is alert and oriented to person, place, and time.  Skin: Skin is warm and dry.  Psychiatric: She has a normal mood and affect. Her behavior is normal.          Assessment & Plan:  Medication management-I. encouraged her to stop the over-the-counter  potassium supplement. They did not  bring the bottle and so I was not sure what that she's actually been taking. For now just takes the prescription potassium. Recheck level in about 2 weeks. Lab slip given today. If the potassium still looks great and if it's on the higher end of normal the we can even consider decreasing the dose down to 10 mEq. Her pills are scored so we can decrease to half a tab and see how she does with that.  As far as her calcium is concerned she was evidently Re: taking a calcium and vitamin D supplement when it was recommended to start a dummy supplement. She did not tell them that she was hurting taking one. I encouraged her to go ahead and finish the dgmmy vitamins since she is Re: purchase them, and then switched back to the [place] brand that she is taking.  I gave her written instructions. I would like to see her back in about 4-8 weeks just to make sure that we are on the same page with everything and encouraged her to bring all her prescriptions and supplements in. I did add the calcium with vitamin D to her medication list.   Vitamin D deficiency-She is also taking an extra vitamin D 5000 units every other day. I think that's reasonable and when she completes her current bottle we can recheck her D. level.  Hypertension-well-controlled. Continue current regimen. Followup in 6 months.

## 2013-06-26 NOTE — Patient Instructions (Signed)
Take your big bottle of calcium only once a day. You can continued the gummy vitamin calciums twice a day until you finish the bottle. Once you finish the gummy vitamins then increase your big bottle of calcium to twice a day. Stop any supplemental potassium and only use the prescription potassium once a day. Go to have her blood work checked in about one to 2 weeks to make sure that the potassium level is stable on the decreased dose. If it still looks good at that time then I will probably have you split your prescription potassium in half and take half a tab once a day.

## 2013-07-07 ENCOUNTER — Other Ambulatory Visit: Payer: Self-pay | Admitting: Family Medicine

## 2013-07-10 ENCOUNTER — Other Ambulatory Visit: Payer: Self-pay | Admitting: *Deleted

## 2013-07-10 DIAGNOSIS — R7989 Other specified abnormal findings of blood chemistry: Secondary | ICD-10-CM

## 2013-07-10 LAB — BASIC METABOLIC PANEL WITH GFR
Calcium: 8.8 mg/dL (ref 8.4–10.5)
Creat: 1.16 mg/dL — ABNORMAL HIGH (ref 0.50–1.10)
GFR, Est African American: 53 mL/min — ABNORMAL LOW
GFR, Est Non African American: 46 mL/min — ABNORMAL LOW
Glucose, Bld: 85 mg/dL (ref 70–99)
Potassium: 3.9 mEq/L (ref 3.5–5.3)
Sodium: 139 mEq/L (ref 135–145)

## 2013-07-13 ENCOUNTER — Other Ambulatory Visit: Payer: Self-pay | Admitting: *Deleted

## 2013-07-13 DIAGNOSIS — C9000 Multiple myeloma not having achieved remission: Secondary | ICD-10-CM

## 2013-07-13 MED ORDER — LENALIDOMIDE 15 MG PO CAPS: 15.0000 mg | ORAL_CAPSULE | ORAL | Status: DC

## 2013-07-14 ENCOUNTER — Other Ambulatory Visit: Payer: Self-pay | Admitting: Family Medicine

## 2013-07-19 ENCOUNTER — Other Ambulatory Visit: Payer: Self-pay | Admitting: Hematology and Oncology

## 2013-07-19 DIAGNOSIS — C9000 Multiple myeloma not having achieved remission: Secondary | ICD-10-CM

## 2013-07-20 ENCOUNTER — Encounter: Payer: Self-pay | Admitting: Hematology and Oncology

## 2013-07-20 ENCOUNTER — Other Ambulatory Visit: Payer: Medicare Other | Admitting: Lab

## 2013-07-20 ENCOUNTER — Other Ambulatory Visit (HOSPITAL_BASED_OUTPATIENT_CLINIC_OR_DEPARTMENT_OTHER): Payer: Medicare Other | Admitting: Lab

## 2013-07-20 ENCOUNTER — Telehealth: Payer: Self-pay | Admitting: Hematology and Oncology

## 2013-07-20 ENCOUNTER — Ambulatory Visit (HOSPITAL_BASED_OUTPATIENT_CLINIC_OR_DEPARTMENT_OTHER): Payer: Medicare Other | Admitting: Hematology and Oncology

## 2013-07-20 VITALS — BP 140/74 | HR 77 | Temp 97.0°F | Resp 20 | Ht 65.0 in | Wt 147.9 lb

## 2013-07-20 DIAGNOSIS — C9000 Multiple myeloma not having achieved remission: Secondary | ICD-10-CM

## 2013-07-20 DIAGNOSIS — T50995A Adverse effect of other drugs, medicaments and biological substances, initial encounter: Secondary | ICD-10-CM

## 2013-07-20 DIAGNOSIS — M549 Dorsalgia, unspecified: Secondary | ICD-10-CM

## 2013-07-20 DIAGNOSIS — M8708 Idiopathic aseptic necrosis of bone, other site: Secondary | ICD-10-CM

## 2013-07-20 LAB — COMPREHENSIVE METABOLIC PANEL (CC13)
ALT: 16 U/L (ref 0–55)
AST: 17 U/L (ref 5–34)
Albumin: 2.8 g/dL — ABNORMAL LOW (ref 3.5–5.0)
Anion Gap: 8 mEq/L (ref 3–11)
CO2: 24 mEq/L (ref 22–29)
Calcium: 9.4 mg/dL (ref 8.4–10.4)
Chloride: 111 mEq/L — ABNORMAL HIGH (ref 98–109)
Sodium: 144 mEq/L (ref 136–145)
Total Bilirubin: 0.62 mg/dL (ref 0.20–1.20)
Total Protein: 6.4 g/dL (ref 6.4–8.3)

## 2013-07-20 LAB — CBC WITH DIFFERENTIAL/PLATELET
BASO%: 2.6 % — ABNORMAL HIGH (ref 0.0–2.0)
EOS%: 5.9 % (ref 0.0–7.0)
HCT: 25.8 % — ABNORMAL LOW (ref 34.8–46.6)
MCH: 32.7 pg (ref 25.1–34.0)
MCHC: 34.2 g/dL (ref 31.5–36.0)
MCV: 95.8 fL (ref 79.5–101.0)
MONO#: 0.5 10*3/uL (ref 0.1–0.9)
MONO%: 17.7 % — ABNORMAL HIGH (ref 0.0–14.0)
NEUT%: 42 % (ref 38.4–76.8)
RDW: 15.1 % — ABNORMAL HIGH (ref 11.2–14.5)
lymph#: 1 10*3/uL (ref 0.9–3.3)

## 2013-07-20 MED ORDER — DEXAMETHASONE 4 MG PO TABS: ORAL_TABLET | ORAL | Status: DC

## 2013-07-20 MED ORDER — LENALIDOMIDE 10 MG PO CAPS: ORAL_CAPSULE | ORAL | Status: DC

## 2013-07-20 MED ORDER — TRAMADOL HCL 50 MG PO TABS: 50.0000 mg | ORAL_TABLET | Freq: Three times a day (TID) | ORAL | Status: DC | PRN

## 2013-07-20 NOTE — Telephone Encounter (Signed)
gv pt appt schedule for November.  °

## 2013-07-20 NOTE — Progress Notes (Signed)
[facility] OFFICE PROGRESS NOTE  Patient Care Team: Agapito Games, MD as PCP - General Jonna Coup, MD (Radiation Oncology) Exie Parody, MD (Hematology and Oncology)  DIAGNOSIS: IgA kappa multiple myeloma for further management  SUMMARY OF ONCOLOGIC HISTORY: [name] is a 77 y.o. female with history of IgA kappa multiple myeloma. She presented to the ER on 04/03/2012 complaining of lower back pain since 09/2011, in addition to chronic bilateral hip pain, felt initially to be secondary to muscular etiology, controlled with muscle relaxers and NSAIDs. However, over the 3 months prior her pain had become increasingly worse, complicated with urinary incontinence and decreased ambulation due to weakness in her lower extremities.   Although her lumbar spine x-ray showed only chronic and degenerative changes and no acute bony pathology was seen, MRI of the L spine without contrast was performed, revealing innumerable lesions throughout the entire visualized portion of the skeleton and sacrum. No significant neural impingement in the lumbar spine.  CXR on 04/04/2012 showed a mass-like opacity in the right hemithorax, may be pleural based versus extrapleural. Workup for multiple myeloma was initiated based on diffuse lytic bone lesions, renal insufficiency, hypercalcemia, anemia.  Laboratories resulted on 04/04/2012 showed an M-spike was 4.06 gm/dL. Her IgA was elevated at 4,020 mg/dL; IgG 353; IgM 5; serum kappa was 2.11; lambda 0.60; serum kappa to lambda ratio elevated at 3.06. Spot urine showed elevated kappa:lambda at 579; and positive for Bence Jones protein. Bone marrow biopsy on 04/08/2012 showed 92% plasma cell. Cytogenetics showed multiple chromosomal abnormalities; additional chromosomal material on chromosome 8p; 14q; loss of chromosome 8 and 13. 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   She was started on subcutaneous Velcade 1.3mg /m2 on 04/12/2012; weekly;  3 weeks on; 1 week off. She started on Revlimid 15mg  PO daily days 1-21 on 04/21/2012. She is also on Dexamethasone 40mg  PO once weekly. She received palliative radiation 20 Gy in 8 fractions to the bilateral hips from 05/09/2012 through 05/18/2012.  She started maintenance Revlimid 15mg  PO daily days 1-21 every 4 weeks in 10/2012. She has continued on monthly Zometa.  INTERVAL HISTORY: MICHALE Fletcher 77 y.o. female returns for further followup. Her current chemotherapy consists of 15 mg Revlimid by mouth 3 weeks on, one-week off. In September 2014, I have discontinued intravenous Zometa due to evidence of osteonecrosis of the jaw. The patient was also recommended to start calcium and vitamin D. She still complaining of intermittent chronic back pain especially when she walks and at that time. She denies any recent fever, chills, night sweats or cough. She has lost 3 pounds of weight recently for unknown reason. The patient denies any recent signs or symptoms of bleeding such as spontaneous epistaxis, hematuria or hematochezia.   I have reviewed the past medical history, past surgical history, social history and family history with the patient and they are unchanged from previous note.  ALLERGIES:  is allergic to simvastatin.  MEDICATIONS:  Current Outpatient Prescriptions  Medication Sig Dispense Refill  . acetaminophen (TYLENOL) 500 MG tablet Take 500-1,000 mg by mouth every 4 (four) hours as needed. No more than 6 tabs in one day.      . ALPRAZolam (XANAX) 1 MG tablet TAKE 1/2 TO 1 TABLET DAILY AS NEEDED FOR SLEEP RO ANXIETY.  30 tablet  0  . Calcium Carbonate-Vitamin D (CALCIUM 500 + D PO) Take 1 tablet by mouth 2 (two) times daily.      Marland Kitchen  Cholecalciferol (VITAMIN D-3) 5000 UNITS TABS Take 1 tablet by mouth every other day.      . CRESTOR 10 MG tablet TAKE 1 TABLET AT BEDTIME.  30 tablet  2  . lenalidomide (REVLIMID) 10 MG capsule Daily for 21 days, then 7 days off  21 capsule  0  .  losartan (COZAAR) 50 MG tablet TAKE 1 TABLET DAILY.  30 tablet  3  . Multiple Vitamin (MULTIVITAMIN WITH MINERALS) TABS Take 1 tablet by mouth daily.      . ondansetron (ZOFRAN) 4 MG tablet Take 4 mg by mouth every 4 (four) hours as needed for nausea. Take 1 tablet every 8 hours as needed for nausea      . potassium chloride SA (K-DUR,KLOR-CON) 20 MEQ tablet Take 20 mEq by mouth daily.      . prochlorperazine (COMPAZINE) 10 MG tablet Take 10 mg by mouth every 6 (six) hours as needed. Take 1 tablet every 6 hours as directed      . dexamethasone (DECADRON) 4 MG tablet Take 2 tablets once a week in the morning with food  24 tablet  3  . traMADol (ULTRAM) 50 MG tablet Take 1 tablet (50 mg total) by mouth every 8 (eight) hours as needed for pain.  60 tablet  3   No current facility-administered medications for this visit.    REVIEW OF SYSTEMS:   Eyes: Denies blurriness of vision Ears, nose, mouth, throat, and face: Denies mucositis or sore throat Respiratory: Denies cough, dyspnea or wheezes Cardiovascular: Denies palpitation, chest discomfort or lower extremity swelling Gastrointestinal:  Denies nausea, heartburn or change in bowel habits Skin: Denies abnormal skin rashes Lymphatics: Denies new lymphadenopathy or easy bruising Neurological:Denies numbness, tingling or new weaknesses Behavioral/Psych: Mood is stable, no new changes  All other systems were reviewed with the patient and are negative.  PHYSICAL EXAMINATION: ECOG PERFORMANCE STATUS: 1 - Symptomatic but completely ambulatory  Filed Vitals:   07/20/13 0923  BP: 140/74  Pulse: 77  Temp: 97 F (36.1 C)  Resp: 20   Filed Weights   07/20/13 0923  Weight: 147 lb 14.4 oz (67.087 kg)    GENERAL:alert, no distress and comfortable. The patient will pale. SKIN: skin color, texture, turgor are normal, no rashes or significant lesions EYES: normal, Conjunctiva are pink and non-injected, sclera clear OROPHARYNX:no exudate, no  erythema and lips, buccal mucosa, and tongue normal . Due to poor dentition. There is evidence of osteonecrosis of the jaw NECK: supple, thyroid normal size, non-tender, without nodularity LYMPH:  no palpable lymphadenopathy in the cervical, axillary or inguinal LUNGS: clear to auscultation and percussion with normal breathing effort HEART: regular rate & rhythm and no murmurs and no lower extremity edema ABDOMEN:abdomen soft, non-tender and normal bowel sounds Musculoskeletal:no cyanosis of digits and no clubbing  NEURO: alert & oriented x 3 with fluent speech, no focal motor/sensory deficits  LABORATORY DATA:  I have reviewed the data as listed    Component Value Date/Time   NA 144 07/20/2013 0855   NA 139 07/10/2013 1001   K 4.2 07/20/2013 0855   K 3.9 07/10/2013 1001   CL 106 07/10/2013 1001   CL 105 02/23/2013 1334   CO2 24 07/20/2013 0855   CO2 27 07/10/2013 1001   GLUCOSE 99 07/20/2013 0855   GLUCOSE 85 07/10/2013 1001   GLUCOSE 83 02/23/2013 1334   GLUCOSE 96 09/30/2006 1322   BUN 18.3 07/20/2013 0855   BUN 18 07/10/2013 1001   CREATININE  1.1 07/20/2013 0855   CREATININE 1.16* 07/10/2013 1001   CREATININE 1.17* 03/23/2013 1155   CALCIUM 9.4 07/20/2013 0855   CALCIUM 8.8 07/10/2013 1001   CALCIUM 11.8* 04/04/2012 0420   PROT 6.4 07/20/2013 0855   PROT 6.3 11/25/2012 1050   ALBUMIN 2.8* 07/20/2013 0855   ALBUMIN 4.2 11/25/2012 1050   AST 17 07/20/2013 0855   AST 13 11/25/2012 1050   ALT 16 07/20/2013 0855   ALT 16 11/25/2012 1050   ALKPHOS 51 07/20/2013 0855   ALKPHOS 38* 11/25/2012 1050   BILITOT 0.62 07/20/2013 0855   BILITOT 1.0 11/25/2012 1050   GFRNONAA 51* 11/27/2012 1237   GFRAA 59* 11/27/2012 1237    No results found for this basename: SPEP,  UPEP,   kappa and lambda light chains    Lab Results  Component Value Date   WBC 3.0* 07/20/2013   NEUTROABS 1.3* 07/20/2013   HGB 8.8* 07/20/2013   HCT 25.8* 07/20/2013   MCV 95.8 07/20/2013   PLT 142* 07/20/2013       Chemistry      Component Value Date/Time   NA 144 07/20/2013 0855   NA 139 07/10/2013 1001   K 4.2 07/20/2013 0855   K 3.9 07/10/2013 1001   CL 106 07/10/2013 1001   CL 105 02/23/2013 1334   CO2 24 07/20/2013 0855   CO2 27 07/10/2013 1001   BUN 18.3 07/20/2013 0855   BUN 18 07/10/2013 1001   CREATININE 1.1 07/20/2013 0855   CREATININE 1.16* 07/10/2013 1001   CREATININE 1.17* 03/23/2013 1155      Component Value Date/Time   CALCIUM 9.4 07/20/2013 0855   CALCIUM 8.8 07/10/2013 1001   CALCIUM 11.8* 04/04/2012 0420   ALKPHOS 51 07/20/2013 0855   ALKPHOS 38* 11/25/2012 1050   AST 17 07/20/2013 0855   AST 13 11/25/2012 1050   ALT 16 07/20/2013 0855   ALT 16 11/25/2012 1050   BILITOT 0.62 07/20/2013 0855   BILITOT 1.0 11/25/2012 1050     ASSESSMENT:  Multiple myeloma Osteonecrosis of the jaw Back pain Pancytopenia  PLAN:  #1 multiple myeloma I have discontinued her Zometa recently due to osteonecrosis of the jaw. Even though her blood work showed that she is in remission, I do not see a 24-hour urine collection done. I will order that. I am reintroducing dexamethasone 8 mg once a week by mouth. I am reducing the Revlimid to 10 mg dose due to pancytopenia. #2 osteonecrosis of the jaw I have discontinued her Zometa. The patient has made an appointment to see the dentist next month. #3 back pain Continue to recommend she take calcium and vitamin D supplement. I gave her prescription for tramadol to try to take as needed. I warned her about side effects of tramadol including risk of sedation, nausea, and constipation #4 pancytopenia This is due to side effects of treatment. I am reducing the dose of her chemotherapy to 10 mg, 3 weeks on, 1 week off.  Orders Placed This Encounter  Procedures  . SPEP & IFE with QIG    Standing Status: Future     Number of Occurrences:      Standing Expiration Date: 07/20/2014  . Protein Electro, 24-Hour Urine    Standing Status: Future      Number of Occurrences:      Standing Expiration Date: 07/20/2014  . IFE, Urine (with Tot Prot)    Standing Status: Future     Number of Occurrences:  Standing Expiration Date: 07/20/2014  . Beta 2 microglobuline, serum    Standing Status: Future     Number of Occurrences:      Standing Expiration Date: 07/20/2014  . Kappa/lambda light chains    Standing Status: Future     Number of Occurrences:      Standing Expiration Date: 07/20/2014  . Immunofixation interpretive, urine    Standing Status: Future     Number of Occurrences:      Standing Expiration Date: 07/20/2014  . CBC with Differential    Standing Status: Future     Number of Occurrences:      Standing Expiration Date: 04/11/2014  . Comprehensive metabolic panel    Standing Status: Future     Number of Occurrences:      Standing Expiration Date: 07/20/2014   All questions were answered. The patient knows to call the clinic with any problems, questions or concerns. No barriers to learning was detected.    Novelle Addair, MD 07/20/2013 10:00 AM

## 2013-07-21 ENCOUNTER — Other Ambulatory Visit: Payer: Self-pay | Admitting: *Deleted

## 2013-07-21 DIAGNOSIS — C9000 Multiple myeloma not having achieved remission: Secondary | ICD-10-CM

## 2013-07-21 MED ORDER — LENALIDOMIDE 10 MG PO CAPS: ORAL_CAPSULE | ORAL | Status: DC

## 2013-07-21 NOTE — Telephone Encounter (Signed)
Rx from Dr. Bertis Ruddy for Revlimid dose change from 15 mg to 10 mg on 10/23.   Refill not due until 1st week of November.  Last refill sent on 10/16.  Will hold Rx at desk until refill is due.

## 2013-07-24 LAB — SPEP & IFE WITH QIG
Albumin ELP: 49.7 % — ABNORMAL LOW (ref 55.8–66.1)
Beta 2: 5.3 % (ref 3.2–6.5)
Beta Globulin: 8.4 % — ABNORMAL HIGH (ref 4.7–7.2)
Gamma Globulin: 18.2 % (ref 11.1–18.8)
IgA: 297 mg/dL (ref 69–380)
IgG (Immunoglobin G), Serum: 1110 mg/dL (ref 690–1700)

## 2013-07-24 LAB — BETA 2 MICROGLOBULIN, SERUM: Beta-2 Microglobulin: 3.64 mg/L — ABNORMAL HIGH (ref 1.01–1.73)

## 2013-08-01 ENCOUNTER — Other Ambulatory Visit: Payer: Self-pay | Admitting: *Deleted

## 2013-08-01 DIAGNOSIS — C9000 Multiple myeloma not having achieved remission: Secondary | ICD-10-CM

## 2013-08-01 MED ORDER — LENALIDOMIDE 10 MG PO CAPS: ORAL_CAPSULE | ORAL | Status: DC

## 2013-08-03 NOTE — Telephone Encounter (Signed)
RECEIVED A FAX FROM [facility] CONCERNING A CONFIRMATION OF PRESCRIPTION SHIPMENT FOR REVLIMID ON 08/02/13.

## 2013-08-11 ENCOUNTER — Other Ambulatory Visit: Payer: Self-pay | Admitting: Family Medicine

## 2013-08-12 ENCOUNTER — Other Ambulatory Visit: Payer: Self-pay | Admitting: Family Medicine

## 2013-08-17 ENCOUNTER — Telehealth: Payer: Self-pay | Admitting: Hematology and Oncology

## 2013-08-17 ENCOUNTER — Ambulatory Visit (HOSPITAL_BASED_OUTPATIENT_CLINIC_OR_DEPARTMENT_OTHER): Payer: Medicare Other | Admitting: Hematology and Oncology

## 2013-08-17 ENCOUNTER — Other Ambulatory Visit (HOSPITAL_BASED_OUTPATIENT_CLINIC_OR_DEPARTMENT_OTHER): Payer: Medicare Other | Admitting: Lab

## 2013-08-17 ENCOUNTER — Encounter: Payer: Self-pay | Admitting: Hematology and Oncology

## 2013-08-17 VITALS — BP 166/105 | HR 71 | Temp 97.5°F | Resp 20 | Ht 65.0 in | Wt 146.2 lb

## 2013-08-17 DIAGNOSIS — C9000 Multiple myeloma not having achieved remission: Secondary | ICD-10-CM

## 2013-08-17 DIAGNOSIS — C9001 Multiple myeloma in remission: Secondary | ICD-10-CM

## 2013-08-17 DIAGNOSIS — D649 Anemia, unspecified: Secondary | ICD-10-CM

## 2013-08-17 DIAGNOSIS — M8708 Idiopathic aseptic necrosis of bone, other site: Secondary | ICD-10-CM

## 2013-08-17 LAB — COMPREHENSIVE METABOLIC PANEL (CC13)
Anion Gap: 10 mEq/L (ref 3–11)
BUN: 13.4 mg/dL (ref 7.0–26.0)
CO2: 22 mEq/L (ref 22–29)
Creatinine: 1.1 mg/dL (ref 0.6–1.1)
Glucose: 86 mg/dl (ref 70–140)
Total Bilirubin: 0.58 mg/dL (ref 0.20–1.20)

## 2013-08-17 LAB — CBC WITH DIFFERENTIAL/PLATELET
Basophils Absolute: 0.1 10*3/uL (ref 0.0–0.1)
Eosinophils Absolute: 0.3 10*3/uL (ref 0.0–0.5)
HCT: 29.4 % — ABNORMAL LOW (ref 34.8–46.6)
HGB: 9.9 g/dL — ABNORMAL LOW (ref 11.6–15.9)
LYMPH%: 27.7 % (ref 14.0–49.7)
MCHC: 33.5 g/dL (ref 31.5–36.0)
MCV: 96.5 fL (ref 79.5–101.0)
MONO#: 0.6 10*3/uL (ref 0.1–0.9)
NEUT#: 2 10*3/uL (ref 1.5–6.5)
NEUT%: 49.7 % (ref 38.4–76.8)
Platelets: 147 10*3/uL (ref 145–400)
WBC: 4.1 10*3/uL (ref 3.9–10.3)

## 2013-08-17 NOTE — Telephone Encounter (Signed)
gv and printed appt sched and avs for pt for DEC °

## 2013-08-17 NOTE — Progress Notes (Signed)
[facility] OFFICE PROGRESS NOTE  Patient Care Team: Agapito Games, MD as PCP - General Jonna Coup, MD (Radiation Oncology) Artis Delay, MD as Consulting Physician (Hematology and Oncology)  DIAGNOSIS: IgA kappa multiple myeloma, ongoing maintenance therapy with Revlimid and dexamethasone   SUMMARY OF ONCOLOGIC HISTORY: Oncology History   Myeloma staging: ISS stage II (albumin 2.4, B2M 4.8) Durie-Salmon Stage III (hypercalcemia, bone lesions, renal failure)     Multiple myeloma   04/04/2012 Tumor Marker M Spike 4gm/dL, IgA 1610 mg/dL, serum kappa 9.60, urine test was positive for Bence Jones proteinemia.    04/08/2012 Bone Marrow Biopsy BM biopsy showed 92% plasma cell, loss chromosome 8 and 13, addition of chromosome 8p, 14q and Myeloma FISH showed 13q and extra chromosome 14   04/12/2012 - 10/20/2012 Chemotherapy Velcade 3 weeks on 1 week off, Revlimid 15 mg PO days 1-21 days, dexamethasone 40 mg weekly.    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    INTERVAL HISTORY: [name] 77 y.o. female returns for Further followup. Her osteonecrosis of the jaw it is not bothering her. She is to have intermittent chronic back pain that is not new. She started taking calcium with vitamin D supplements as suggested. Recently we reduced the dose of Revlimid by 5 mg. She felt that the reduced dosage give her more energy level. She denies any recent fever, chills, night sweats or abnormal weight  loss The patient denies any recent signs or symptoms of bleeding such as spontaneous epistaxis, hematuria or hematochezia.  I have reviewed the past medical history, past surgical history, social history and family history with the patient and they are unchanged from previous note.  ALLERGIES:  is allergic to simvastatin.  MEDICATIONS:  Current Outpatient Prescriptions  Medication Sig Dispense Refill  . acetaminophen (TYLENOL) 500 MG tablet Take 500-1,000 mg by mouth every 4 (four) hours as needed. No more than 6 tabs in one day.      . ALPRAZolam (XANAX) 1 MG tablet TAKE 1/2 TO 1 TABLET DAILY AS NEEDED FOR SLEEP OR ANXIETY  30 tablet  0  . aspirin 81 MG tablet Take 81 mg by mouth daily.      . Calcium Carbonate-Vitamin D (CALCIUM 500 + D PO) Take 1 tablet by mouth 2 (two) times daily.      . Cholecalciferol (VITAMIN D-3) 5000 UNITS TABS Take 1 tablet by mouth every other day.      . CRESTOR 10 MG tablet TAKE 1 TABLET AT BEDTIME.  30 tablet  2  . dexamethasone (DECADRON) 4 MG tablet Take 2 tablets once a week in the morning with food  24 tablet  3  . lenalidomide (REVLIMID) 10 MG capsule Daily for 21 days, then 7 days off  21 capsule  0  . losartan (COZAAR) 50 MG tablet TAKE 1 TABLET DAILY.  30 tablet  3  . Multiple Vitamin (MULTIVITAMIN WITH MINERALS) TABS Take 1 tablet by mouth daily.      Marland Kitchen  potassium chloride SA (K-DUR,KLOR-CON) 20 MEQ tablet Take 20 mEq by mouth daily.      . traMADol (ULTRAM) 50 MG tablet Take 1 tablet (50 mg total) by mouth every 8 (eight) hours as needed for pain.  60 tablet  3  . ondansetron (ZOFRAN) 4 MG tablet Take 4 mg by mouth every 4 (four) hours as needed for nausea. Take 1 tablet every 8 hours as needed for nausea      . prochlorperazine (COMPAZINE) 10 MG tablet Take 10 mg by mouth every 6 (six) hours as needed. Take 1 tablet every 6 hours as directed       No current facility-administered medications for this visit.    REVIEW OF SYSTEMS:   Constitutional:  Denies fevers, chills or abnormal weight loss Eyes: Denies blurriness of vision Ears, nose, mouth, throat, and face: Denies mucositis or sore throat Respiratory: Denies cough, dyspnea or wheezes Cardiovascular: Denies palpitation, chest discomfort or lower extremity swelling Gastrointestinal:  Denies nausea, heartburn or change in bowel habits Skin: Denies abnormal skin rashes Lymphatics: Denies new lymphadenopathy or easy bruising Neurological:Denies numbness, tingling or new weaknesses Behavioral/Psych: Mood is stable, no new changes  All other systems were reviewed with the patient and are negative.  PHYSICAL EXAMINATION: ECOG PERFORMANCE STATUS: 0 - Asymptomatic  Filed Vitals:   08/17/13 1130  BP: 166/105  Pulse: 71  Temp: 97.5 F (36.4 C)  Resp: 20   Filed Weights   08/17/13 1130  Weight: 146 lb 3.2 oz (66.316 kg)    GENERAL:alert, no distress and comfortable SKIN: skin color, texture, turgor are normal, no rashes or significant lesions EYES: normal, Conjunctiva are pink and non-injected, sclera clear OROPHARYNX:no exudate, no erythema and lips, buccal mucosa, and tongue normal . Persistent changes consistent with osteonecrosis of the jaw  NECK: supple, thyroid normal size, non-tender, without nodularity LYMPH:  no palpable lymphadenopathy in the cervical, axillary or inguinal LUNGS: clear to auscultation and percussion with normal breathing effort HEART: regular rate & rhythm and no murmurs and no lower extremity edema ABDOMEN:abdomen soft, non-tender and normal bowel sounds Musculoskeletal:no cyanosis of digits and no clubbing  NEURO: alert & oriented x 3 with fluent speech, no focal motor/sensory deficits  LABORATORY DATA:  I have reviewed the data as listed    Component Value Date/Time   NA 141 08/17/2013 1111   NA 139 07/10/2013 1001   K 3.3* 08/17/2013 1111   K 3.9 07/10/2013 1001   CL 106 07/10/2013 1001   CL 105 02/23/2013 1334   CO2 22 08/17/2013 1111    CO2 27 07/10/2013 1001   GLUCOSE 86 08/17/2013 1111   GLUCOSE 85 07/10/2013 1001   GLUCOSE 83 02/23/2013 1334   GLUCOSE 96 09/30/2006 1322   BUN 13.4 08/17/2013 1111   BUN 18 07/10/2013 1001   CREATININE 1.1 08/17/2013 1111   CREATININE 1.16* 07/10/2013 1001   CREATININE 1.17* 03/23/2013 1155   CALCIUM 9.5 08/17/2013 1111   CALCIUM 8.8 07/10/2013 1001   CALCIUM 11.8* 04/04/2012 0420   PROT 6.5 08/17/2013 1111   PROT 6.3 11/25/2012 1050   ALBUMIN 3.2* 08/17/2013 1111   ALBUMIN 4.2 11/25/2012 1050   AST 16 08/17/2013 1111   AST 13 11/25/2012 1050   ALT 13 08/17/2013 1111   ALT 16 11/25/2012 1050   ALKPHOS 48 08/17/2013 1111   ALKPHOS 38* 11/25/2012 1050   BILITOT 0.58 08/17/2013 1111   BILITOT 1.0 11/25/2012 1050   GFRNONAA 51* 11/27/2012 1237   GFRAA 59*  11/27/2012 1237    No results found for this basename: SPEP,  UPEP,   kappa and lambda light chains    Lab Results  Component Value Date   WBC 4.1 08/17/2013   NEUTROABS 2.0 08/17/2013   HGB 9.9* 08/17/2013   HCT 29.4* 08/17/2013   MCV 96.5 08/17/2013   PLT 147 08/17/2013      Chemistry      Component Value Date/Time   NA 141 08/17/2013 1111   NA 139 07/10/2013 1001   K 3.3* 08/17/2013 1111   K 3.9 07/10/2013 1001   CL 106 07/10/2013 1001   CL 105 02/23/2013 1334   CO2 22 08/17/2013 1111   CO2 27 07/10/2013 1001   BUN 13.4 08/17/2013 1111   BUN 18 07/10/2013 1001   CREATININE 1.1 08/17/2013 1111   CREATININE 1.16* 07/10/2013 1001   CREATININE 1.17* 03/23/2013 1155      Component Value Date/Time   CALCIUM 9.5 08/17/2013 1111   CALCIUM 8.8 07/10/2013 1001   CALCIUM 11.8* 04/04/2012 0420   ALKPHOS 48 08/17/2013 1111   ALKPHOS 38* 11/25/2012 1050   AST 16 08/17/2013 1111   AST 13 11/25/2012 1050   ALT 13 08/17/2013 1111   ALT 16 11/25/2012 1050   BILITOT 0.58 08/17/2013 1111   BILITOT 1.0 11/25/2012 1050     ASSESSMENT & PLAN:  #1 multiple myeloma I have discontinued her Zometa recently due to osteonecrosis of the jaw.  She needs dental work up and evaluation. A dentist requested a letter of clearance. I gave her the letter of clearance today to proceed with dental work up during the week she is off Revlimid next month. Even though her blood work showed that she is in remission, I do not see a 24-hour urine collection done. This has been ordered and pending.  She just restart another cycle of Revlimid on 08/12/2013. I recommend to continue on current treatment program which consists of dexamethasone 8 mg once a week by mouth and Revlimid to 10 mg dose 21 days on, 7 days off. #2 osteonecrosis of the jaw I have discontinued her Zometa. The patient has made an appointment to see the dentist next month. #3 back pain Continue to recommend she take calcium and vitamin D supplement. #4 leukopenia Since we reduced the dose of Revlimid, this has resolved. #5 anemia This is likely due to recent treatment. Since I reduced the dose of Revlimid, this has improved. The patient denies recent history of bleeding such as epistaxis, hematuria or hematochezia. She is asymptomatic from the anemia. I will observe for now.  She does not require transfusion now. I will continue the chemotherapy at current dose without dosage adjustment.  If the anemia gets progressive worse in the future, I might have to delay her treatment or adjust the chemotherapy dose.   Orders Placed This Encounter  Procedures  . Comprehensive metabolic panel    Standing Status: Future     Number of Occurrences:      Standing Expiration Date: 08/17/2014  . CBC with Differential    Standing Status: Future     Number of Occurrences:      Standing Expiration Date: 05/09/2014  . Beta 2 microglobuline, serum    Standing Status: Future     Number of Occurrences:      Standing Expiration Date: 08/17/2014  . SPEP & IFE with QIG    Standing Status: Future     Number of Occurrences:      Standing  Expiration Date: 08/17/2014   All questions were answered. The  patient knows to call the clinic with any problems, questions or concerns. No barriers to learning was detected.    Leatha Rohner, MD 08/17/2013 12:04 PM

## 2013-08-21 LAB — UPEP/TP, 24-HR URINE
Collection Interval: 24 hours
Total Protein, Urine/Day: 100 mg/d (ref 50–100)
Total Protein, Urine: 5 mg/dL

## 2013-08-21 LAB — UIFE/LIGHT CHAINS/TP QN, 24-HR UR
Albumin, U: DETECTED
Alpha 1, Urine: DETECTED — AB
Free Kappa Lt Chains,Ur: 5.01 mg/dL — ABNORMAL HIGH (ref 0.14–2.42)
Free Kappa/Lambda Ratio: 8.79 ratio (ref 2.04–10.37)
Free Lambda Excretion/Day: 11.4 mg/d
Free Lambda Lt Chains,Ur: 0.57 mg/dL (ref 0.02–0.67)
Free Lt Chn Excr Rate: 100.2 mg/d
Gamma Globulin, Urine: DETECTED — AB
Time: 24 hours
Total Protein, Urine: 6.3 mg/dL
Volume, Urine: 2000 mL

## 2013-08-22 LAB — SPEP & IFE WITH QIG
Alpha-1-Globulin: 6.3 % — ABNORMAL HIGH (ref 2.9–4.9)
Alpha-2-Globulin: 13.1 % — ABNORMAL HIGH (ref 7.1–11.8)
Beta 2: 5.2 % (ref 3.2–6.5)
Beta Globulin: 8.3 % — ABNORMAL HIGH (ref 4.7–7.2)
IgA: 210 mg/dL (ref 69–380)
IgG (Immunoglobin G), Serum: 948 mg/dL (ref 690–1700)
Total Protein, Serum Electrophoresis: 6.2 g/dL (ref 6.0–8.3)

## 2013-08-22 LAB — BETA 2 MICROGLOBULIN, SERUM: Beta-2 Microglobulin: 2.8 mg/L — ABNORMAL HIGH (ref 1.01–1.73)

## 2013-08-22 LAB — KAPPA/LAMBDA LIGHT CHAINS: Kappa:Lambda Ratio: 1.29 (ref 0.26–1.65)

## 2013-09-01 ENCOUNTER — Other Ambulatory Visit: Payer: Self-pay | Admitting: *Deleted

## 2013-09-01 DIAGNOSIS — C9000 Multiple myeloma not having achieved remission: Secondary | ICD-10-CM

## 2013-09-01 MED ORDER — LENALIDOMIDE 10 MG PO CAPS: ORAL_CAPSULE | ORAL | Status: DC

## 2013-09-01 NOTE — Telephone Encounter (Signed)
THIS REFILL REQUEST FOR REVLIMID WAS PLACED IN DR.GORSUCH'S ACTIVE WORK FOLDER. 

## 2013-09-04 NOTE — Telephone Encounter (Signed)
RECEIVED A FAX FROM [facility] CONCERNING A CONFIRMATION OF FACSIMILE RECEIPT FOR PT. REFERRAL. 

## 2013-09-05 NOTE — Telephone Encounter (Signed)
RECEIVED A FAX FROM [facility] CONCERNING A CONFIRMATION OF PRESCRIPTION SHIPMENT FOR REVLIMID ON 09/04/13. 

## 2013-09-07 ENCOUNTER — Telehealth: Payer: Self-pay | Admitting: Hematology and Oncology

## 2013-09-07 ENCOUNTER — Encounter: Payer: Self-pay | Admitting: Hematology and Oncology

## 2013-09-07 ENCOUNTER — Ambulatory Visit (HOSPITAL_BASED_OUTPATIENT_CLINIC_OR_DEPARTMENT_OTHER): Payer: Medicare Other | Admitting: Hematology and Oncology

## 2013-09-07 ENCOUNTER — Other Ambulatory Visit (HOSPITAL_BASED_OUTPATIENT_CLINIC_OR_DEPARTMENT_OTHER): Payer: Medicare Other

## 2013-09-07 VITALS — BP 157/82 | HR 67 | Temp 98.2°F | Resp 18 | Ht 65.0 in | Wt 147.1 lb

## 2013-09-07 DIAGNOSIS — C9000 Multiple myeloma not having achieved remission: Secondary | ICD-10-CM

## 2013-09-07 DIAGNOSIS — M8708 Idiopathic aseptic necrosis of bone, other site: Secondary | ICD-10-CM

## 2013-09-07 DIAGNOSIS — D649 Anemia, unspecified: Secondary | ICD-10-CM

## 2013-09-07 LAB — CBC WITH DIFFERENTIAL/PLATELET
Basophils Absolute: 0.1 10*3/uL (ref 0.0–0.1)
Eosinophils Absolute: 0.2 10*3/uL (ref 0.0–0.5)
HGB: 10.3 g/dL — ABNORMAL LOW (ref 11.6–15.9)
MCV: 96.8 fL (ref 79.5–101.0)
MONO#: 0.5 10*3/uL (ref 0.1–0.9)
NEUT#: 2 10*3/uL (ref 1.5–6.5)
RBC: 3.14 10*6/uL — ABNORMAL LOW (ref 3.70–5.45)
RDW: 14.9 % — ABNORMAL HIGH (ref 11.2–14.5)
WBC: 4.3 10*3/uL (ref 3.9–10.3)

## 2013-09-07 LAB — COMPREHENSIVE METABOLIC PANEL (CC13)
AST: 20 U/L (ref 5–34)
Albumin: 3.2 g/dL — ABNORMAL LOW (ref 3.5–5.0)
Alkaline Phosphatase: 46 U/L (ref 40–150)
BUN: 11.7 mg/dL (ref 7.0–26.0)
CO2: 25 mEq/L (ref 22–29)
Calcium: 9.4 mg/dL (ref 8.4–10.4)
Chloride: 108 mEq/L (ref 98–109)
Creatinine: 1 mg/dL (ref 0.6–1.1)
Glucose: 86 mg/dl (ref 70–140)
Potassium: 3.6 mEq/L (ref 3.5–5.1)
Sodium: 141 mEq/L (ref 136–145)
Total Protein: 6.2 g/dL — ABNORMAL LOW (ref 6.4–8.3)

## 2013-09-07 NOTE — Progress Notes (Signed)
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    04/08/2012 Bone Marrow Biopsy BM biopsy showed 92% plasma cell, loss chromosome 8 and 13, addition of chromosome 8p, 14q and Myeloma FISH showed 13q and extra chromosome 14   04/12/2012 - 10/20/2012 Chemotherapy Velcade 3 weeks on 1 week off, Revlimid 15 mg PO days 1-21 days, dexamethasone 40 mg weekly.    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    INTERVAL HISTORY: [name] 77 y.o. female returns for further followup. She went to see her dentist recently who did not recommend dental extraction for osteonecrosis of the jaw. She denies any new bone pain. She is to have chronic back pain. She has been compliant with taking calcium, vitamin D, and aspirin. Her current treatment consists of Revlimid, 10 mg, 21 days on, 7 days off. The next cycle begins on  09/09/2013. Previously, I reduced her Revlimid dose from 15 mg to 10 mg. Her pancytopenia has improved. She also takes dexamethasone 8 mg once a week.  I have reviewed the past medical history, past surgical history, social history and family history with the patient and they are unchanged from previous note.  ALLERGIES:  is allergic to simvastatin.  MEDICATIONS:  Current Outpatient Prescriptions  Medication Sig Dispense Refill  . acetaminophen (TYLENOL) 500 MG tablet Take 500-1,000 mg by mouth every 4 (four) hours as needed. No more than 6 tabs in one day.      . ALPRAZolam (XANAX) 1 MG tablet TAKE 1/2 TO 1 TABLET DAILY AS NEEDED FOR SLEEP OR ANXIETY  30 tablet  0  . aspirin 81 MG tablet Take 81 mg by mouth daily.      . Calcium Carbonate-Vitamin D (CALCIUM 500 + D PO) Take 1 tablet by mouth 2 (two) times daily.      . Cholecalciferol (VITAMIN D-3) 5000 UNITS TABS Take 1 tablet by mouth every other day.      . CRESTOR 10 MG tablet TAKE 1 TABLET AT BEDTIME.  30 tablet  2  . dexamethasone (DECADRON) 4 MG tablet Take 2 tablets once a week in the morning with food  24 tablet  3  . lenalidomide (REVLIMID) 10 MG capsule Daily for 21 days, then 7 days off  21 capsule  0  . losartan (COZAAR) 50 MG tablet TAKE 1 TABLET DAILY.  30 tablet  3  . Multiple Vitamin (MULTIVITAMIN WITH MINERALS) TABS Take 1  tablet by mouth daily.      . ondansetron (ZOFRAN) 4 MG tablet Take 4 mg by mouth every 4 (four) hours as needed for nausea. Take 1 tablet every 8 hours as needed for nausea      . potassium chloride SA (K-DUR,KLOR-CON) 20 MEQ tablet Take 20 mEq by mouth daily.      . prochlorperazine (COMPAZINE) 10 MG tablet Take 10 mg by mouth every 6 (six) hours as needed. Take 1 tablet every 6 hours as directed      . traMADol (ULTRAM) 50 MG tablet Take 1 tablet (50 mg total) by mouth every 8 (eight) hours as needed for pain.  60 tablet  3   No current facility-administered medications for this visit.    REVIEW OF  SYSTEMS:   Constitutional: Denies fevers, chills or abnormal weight loss Eyes: Denies blurriness of vision Ears, nose, mouth, throat, and face: Denies mucositis or sore throat Respiratory: Denies cough, dyspnea or wheezes Cardiovascular: Denies palpitation, chest discomfort or lower extremity swelling Gastrointestinal:  Denies nausea, heartburn or change in bowel habits Skin: Denies abnormal skin rashes Lymphatics: Denies new lymphadenopathy or easy bruising Neurological:Denies numbness, tingling or new weaknesses Behavioral/Psych: Mood is stable, no new changes  All other systems were reviewed with the patient and are negative.  PHYSICAL EXAMINATION: ECOG PERFORMANCE STATUS: 0 - Asymptomatic  Filed Vitals:   09/07/13 1216  BP: 157/82  Pulse: 67  Temp: 98.2 F (36.8 C)  Resp: 18   Filed Weights   09/07/13 1216  Weight: 147 lb 1.6 oz (66.724 kg)    GENERAL:alert, no distress and comfortable SKIN: skin color, texture, turgor are normal, no rashes or significant lesions EYES: normal, Conjunctiva are pink and non-injected, sclera clear OROPHARYNX:no exudate, no erythema and lips, buccal mucosa, and tongue normal . Poor dentition with persistent osteonecrosis of the jaw NECK: supple, thyroid normal size, non-tender, without nodularity LYMPH:  no palpable lymphadenopathy in the cervical, axillary or inguinal LUNGS: clear to auscultation and percussion with normal breathing effort HEART: regular rate & rhythm and no murmurs and no lower extremity edema ABDOMEN:abdomen soft, non-tender and normal bowel sounds Musculoskeletal:no cyanosis of digits and no clubbing  NEURO: alert & oriented x 3 with fluent speech, no focal motor/sensory deficits  LABORATORY DATA:  I have reviewed the data as listed    Component Value Date/Time   NA 141 09/07/2013 1154   NA 139 07/10/2013 1001   K 3.6 09/07/2013 1154   K 3.9 07/10/2013 1001   CL 106 07/10/2013 1001   CL 105 02/23/2013 1334   CO2  25 09/07/2013 1154   CO2 27 07/10/2013 1001   GLUCOSE 86 09/07/2013 1154   GLUCOSE 85 07/10/2013 1001   GLUCOSE 83 02/23/2013 1334   GLUCOSE 96 09/30/2006 1322   BUN 11.7 09/07/2013 1154   BUN 18 07/10/2013 1001   CREATININE 1.0 09/07/2013 1154   CREATININE 1.16* 07/10/2013 1001   CREATININE 1.17* 03/23/2013 1155   CALCIUM 9.4 09/07/2013 1154   CALCIUM 8.8 07/10/2013 1001   CALCIUM 11.8* 04/04/2012 0420   PROT 6.2* 09/07/2013 1154   PROT 6.3 11/25/2012 1050   ALBUMIN 3.2* 09/07/2013 1154   ALBUMIN 4.2 11/25/2012 1050   AST 20 09/07/2013 1154   AST 13 11/25/2012 1050   ALT 17 09/07/2013 1154   ALT 16 11/25/2012 1050   ALKPHOS 46 09/07/2013 1154   ALKPHOS 38* 11/25/2012 1050   BILITOT 0.71 09/07/2013 1154   BILITOT 1.0 11/25/2012 1050  GFRNONAA 51* 11/27/2012 1237   GFRAA 59* 11/27/2012 1237    No results found for this basename: SPEP,  UPEP,   kappa and lambda light chains    Lab Results  Component Value Date   WBC 4.3 09/07/2013   NEUTROABS 2.0 09/07/2013   HGB 10.3* 09/07/2013   HCT 30.4* 09/07/2013   MCV 96.8 09/07/2013   PLT 138* 09/07/2013      Chemistry      Component Value Date/Time   NA 141 09/07/2013 1154   NA 139 07/10/2013 1001   K 3.6 09/07/2013 1154   K 3.9 07/10/2013 1001   CL 106 07/10/2013 1001   CL 105 02/23/2013 1334   CO2 25 09/07/2013 1154   CO2 27 07/10/2013 1001   BUN 11.7 09/07/2013 1154   BUN 18 07/10/2013 1001   CREATININE 1.0 09/07/2013 1154   CREATININE 1.16* 07/10/2013 1001   CREATININE 1.17* 03/23/2013 1155      Component Value Date/Time   CALCIUM 9.4 09/07/2013 1154   CALCIUM 8.8 07/10/2013 1001   CALCIUM 11.8* 04/04/2012 0420   ALKPHOS 46 09/07/2013 1154   ALKPHOS 38* 11/25/2012 1050   AST 20 09/07/2013 1154   AST 13 11/25/2012 1050   ALT 17 09/07/2013 1154   ALT 16 11/25/2012 1050   BILITOT 0.71 09/07/2013 1154   BILITOT 1.0 11/25/2012 1050      ASSESSMENT & PLAN:  #1 multiple myeloma I have discontinued her Zometa recently due to  osteonecrosis of the jaw. She is due to start another cycle of Revlimid on 09/09/2013. Her most recent blood work and urine tests confirmed she is in very good partial response I recommend to continue on current treatment program which consists of dexamethasone 8 mg once a week by mouth and Revlimid to 10 mg dose 21 days on, 7 days off. #2 osteonecrosis of the jaw I have discontinued her Zometa. The patient has made an appointment to see the dentist next month. #3 back pain Continue to recommend she take calcium and vitamin D supplement. #4 leukopenia Since we reduced the dose of Revlimid, this has resolved. #5 anemia This is likely due to recent treatment. Since I reduced the dose of Revlimid, this has improved. The patient denies recent history of bleeding such as epistaxis, hematuria or hematochezia. She is asymptomatic from the anemia. I will observe for now.  She does not require transfusion now. I will continue the chemotherapy at current dose without dosage adjustment.  If the anemia gets progressive worse in the future, I might have to delay her treatment or adjust the chemotherapy dose.   Orders Placed This Encounter  Procedures  . Beta 2 microglobuline, serum    Standing Status: Future     Number of Occurrences:      Standing Expiration Date: 09/07/2014  . SPEP & IFE with QIG    Standing Status: Future     Number of Occurrences:      Standing Expiration Date: 09/07/2014  . Comprehensive metabolic panel    Standing Status: Future     Number of Occurrences:      Standing Expiration Date: 09/07/2014  . CBC with Differential    Standing Status: Future     Number of Occurrences:      Standing Expiration Date: 05/30/2014   All questions were answered. The patient knows to call the clinic with any problems, questions or concerns. No barriers to learning was detected. I spent 25 minutes counseling the patient face to  face. The total time spent in the appointment was 40 minutes and  more than 50% was on counseling and review of test results     [facility], Judythe Postema, MD 09/07/2013 12:45 PM

## 2013-09-07 NOTE — Telephone Encounter (Signed)
appts made per 12/11 POF AVS and CAL given shh °

## 2013-09-11 ENCOUNTER — Other Ambulatory Visit: Payer: Self-pay | Admitting: Physician Assistant

## 2013-09-11 LAB — SPEP & IFE WITH QIG
Alpha-2-Globulin: 11.5 % (ref 7.1–11.8)
Beta 2: 4 % (ref 3.2–6.5)
Gamma Globulin: 13.6 % (ref 11.1–18.8)
IgA: 209 mg/dL (ref 69–380)
IgG (Immunoglobin G), Serum: 798 mg/dL (ref 690–1700)
IgM, Serum: 23 mg/dL — ABNORMAL LOW (ref 52–322)
M-Spike, %: 0.3 g/dL
Total Protein, Serum Electrophoresis: 6 g/dL (ref 6.0–8.3)

## 2013-09-11 LAB — BETA 2 MICROGLOBULIN, SERUM: Beta-2 Microglobulin: 2.8 mg/L — ABNORMAL HIGH (ref 1.01–1.73)

## 2013-09-14 ENCOUNTER — Other Ambulatory Visit: Payer: Medicare Other | Admitting: Lab

## 2013-09-19 ENCOUNTER — Other Ambulatory Visit: Payer: Self-pay | Admitting: *Deleted

## 2013-09-19 DIAGNOSIS — C9000 Multiple myeloma not having achieved remission: Secondary | ICD-10-CM

## 2013-09-19 MED ORDER — LENALIDOMIDE 10 MG PO CAPS: ORAL_CAPSULE | ORAL | Status: DC

## 2013-09-19 NOTE — Telephone Encounter (Signed)
THIS REFILL REQUEST FOR REVLIMID WAS PLACED IN [facility] ACTIVE WORK FOLDER.

## 2013-10-02 NOTE — Telephone Encounter (Signed)
RECEIVED A FAX FROM [employer] CONCERNING A CONFIRMATION OF PRESCRIPTION SHIPMENT FOR REVLIMID ON 09/29/13.

## 2013-10-10 ENCOUNTER — Other Ambulatory Visit: Payer: Self-pay | Admitting: Family Medicine

## 2013-10-12 ENCOUNTER — Telehealth: Payer: Self-pay | Admitting: Hematology and Oncology

## 2013-10-12 ENCOUNTER — Other Ambulatory Visit: Payer: Medicare Other | Admitting: Lab

## 2013-10-12 ENCOUNTER — Ambulatory Visit (HOSPITAL_BASED_OUTPATIENT_CLINIC_OR_DEPARTMENT_OTHER): Payer: Medicare Other | Admitting: Hematology and Oncology

## 2013-10-12 ENCOUNTER — Other Ambulatory Visit (HOSPITAL_BASED_OUTPATIENT_CLINIC_OR_DEPARTMENT_OTHER): Payer: Medicare Other

## 2013-10-12 VITALS — BP 165/89 | HR 78 | Temp 97.9°F | Resp 18 | Ht 65.0 in | Wt 145.8 lb

## 2013-10-12 DIAGNOSIS — C9000 Multiple myeloma not having achieved remission: Secondary | ICD-10-CM

## 2013-10-12 DIAGNOSIS — M549 Dorsalgia, unspecified: Secondary | ICD-10-CM

## 2013-10-12 DIAGNOSIS — D649 Anemia, unspecified: Secondary | ICD-10-CM

## 2013-10-12 DIAGNOSIS — M8708 Idiopathic aseptic necrosis of bone, other site: Secondary | ICD-10-CM

## 2013-10-12 DIAGNOSIS — D72819 Decreased white blood cell count, unspecified: Secondary | ICD-10-CM

## 2013-10-12 LAB — CBC WITH DIFFERENTIAL/PLATELET
BASO%: 1.9 % (ref 0.0–2.0)
Basophils Absolute: 0.1 10*3/uL (ref 0.0–0.1)
EOS ABS: 0.2 10*3/uL (ref 0.0–0.5)
EOS%: 4.2 % (ref 0.0–7.0)
HCT: 33.7 % — ABNORMAL LOW (ref 34.8–46.6)
HGB: 11.2 g/dL — ABNORMAL LOW (ref 11.6–15.9)
LYMPH%: 27.8 % (ref 14.0–49.7)
MCH: 32 pg (ref 25.1–34.0)
MCHC: 33.2 g/dL (ref 31.5–36.0)
MCV: 96.5 fL (ref 79.5–101.0)
MONO#: 0.7 10*3/uL (ref 0.1–0.9)
MONO%: 15.1 % — AB (ref 0.0–14.0)
NEUT%: 51 % (ref 38.4–76.8)
NEUTROS ABS: 2.4 10*3/uL (ref 1.5–6.5)
Platelets: 166 10*3/uL (ref 145–400)
RBC: 3.5 10*6/uL — ABNORMAL LOW (ref 3.70–5.45)
RDW: 15.4 % — AB (ref 11.2–14.5)
WBC: 4.7 10*3/uL (ref 3.9–10.3)
lymph#: 1.3 10*3/uL (ref 0.9–3.3)

## 2013-10-12 LAB — COMPREHENSIVE METABOLIC PANEL (CC13)
ALBUMIN: 3.2 g/dL — AB (ref 3.5–5.0)
ALT: 18 U/L (ref 0–55)
AST: 18 U/L (ref 5–34)
Alkaline Phosphatase: 54 U/L (ref 40–150)
Anion Gap: 10 mEq/L (ref 3–11)
BUN: 13.6 mg/dL (ref 7.0–26.0)
CO2: 26 mEq/L (ref 22–29)
Calcium: 9.4 mg/dL (ref 8.4–10.4)
Chloride: 107 mEq/L (ref 98–109)
Creatinine: 1.2 mg/dL — ABNORMAL HIGH (ref 0.6–1.1)
GLUCOSE: 88 mg/dL (ref 70–140)
POTASSIUM: 4.2 meq/L (ref 3.5–5.1)
Sodium: 143 mEq/L (ref 136–145)
Total Bilirubin: 0.43 mg/dL (ref 0.20–1.20)
Total Protein: 6.3 g/dL — ABNORMAL LOW (ref 6.4–8.3)

## 2013-10-12 NOTE — Telephone Encounter (Signed)
Gave pt appt for lab and MD for February 2015 °

## 2013-10-14 NOTE — Progress Notes (Signed)
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    04/08/2012 Bone Marrow Biopsy BM biopsy showed 92% plasma cell, loss chromosome 8 and 13, addition of chromosome 8p, 14q and Myeloma FISH showed 13q and extra chromosome 14   04/12/2012 - 10/20/2012 Chemotherapy Velcade 3 weeks on 1 week off, Revlimid 15 mg PO days 1-21 days, dexamethasone 40 mg weekly.    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    INTERVAL HISTORY: PHYLISS HULICK 78 y.o. female returns for further followup. She is still undergoing treatment for osteonecrosis of the jaw. We have reduced dose off Revlimid she has more energy She denies any recent fever, chills, night sweats or abnormal weight loss I have reviewed the past medical history, past surgical history, social history and family history with the patient and they are unchanged from previous note.  ALLERGIES:  is allergic to simvastatin.  MEDICATIONS:  Current Outpatient Prescriptions   Medication Sig Dispense Refill  . acetaminophen (TYLENOL) 500 MG tablet Take 500-1,000 mg by mouth every 4 (four) hours as needed. No more than 6 tabs in one day.      . ALPRAZolam (XANAX) 1 MG tablet TAKE 1/2 TO 1 TABLET DAILY AS NEEDED FOR SLEEP OR ANXIETY  30 tablet  0  . aspirin 81 MG tablet Take 81 mg by mouth daily.      . Calcium Carbonate-Vitamin D (CALCIUM 500 + D PO) Take 1 tablet by mouth 2 (two) times daily.      . Cholecalciferol (VITAMIN D-3) 5000 UNITS TABS Take 1 tablet by mouth every other day.      . CRESTOR 10 MG tablet TAKE 1 TABLET AT BEDTIME.  30 tablet  3  . dexamethasone (DECADRON) 4 MG tablet Take 2 tablets once a week in the morning with food  24 tablet  3  . lenalidomide (REVLIMID) 10 MG capsule Daily for 21 days, then 7 days off  21 capsule  0  . losartan (COZAAR) 50 MG tablet TAKE 1 TABLET DAILY.  30 tablet  3  . Multiple Vitamin (MULTIVITAMIN WITH MINERALS) TABS Take 1 tablet by mouth daily.      . ondansetron (ZOFRAN) 4 MG tablet Take 4 mg by mouth every 4 (four) hours as needed for nausea. Take 1 tablet every 8 hours as needed for nausea      . potassium chloride SA (K-DUR,KLOR-CON) 20 MEQ tablet Take 20 mEq by mouth daily.      . prochlorperazine (COMPAZINE) 10 MG tablet Take  10 mg by mouth every 6 (six) hours as needed. Take 1 tablet every 6 hours as directed      . traMADol (ULTRAM) 50 MG tablet Take 1 tablet (50 mg total) by mouth every 8 (eight) hours as needed for pain.  60 tablet  3   No current facility-administered medications for this visit.    REVIEW OF SYSTEMS:   Constitutional: Denies fevers, chills or abnormal weight loss Eyes: Denies blurriness of vision Ears, nose, mouth, throat, and face: Denies mucositis or sore throat Respiratory: Denies cough, dyspnea or wheezes Cardiovascular: Denies palpitation, chest discomfort or lower extremity swelling Gastrointestinal:  Denies nausea, heartburn or change in bowel habits Skin: Denies abnormal skin  rashes Lymphatics: Denies new lymphadenopathy or easy bruising Neurological:Denies numbness, tingling or new weaknesses Behavioral/Psych: Mood is stable, no new changes  All other systems were reviewed with the patient and are negative.  PHYSICAL EXAMINATION: ECOG PERFORMANCE STATUS: 0 - Asymptomatic  Filed Vitals:   10/12/13 1314  BP: 165/89  Pulse: 78  Temp: 97.9 F (36.6 C)  Resp: 18   Filed Weights   10/12/13 1314  Weight: 145 lb 12.8 oz (66.134 kg)    GENERAL:alert, no distress and comfortable SKIN: skin color, texture, turgor are normal, no rashes or significant lesions EYES: normal, Conjunctiva are pink and non-injected, sclera clear OROPHARYNX:no exudate, no erythema and lips, buccal mucosa, and tongue normal . Very poor dentition is noted with evidence of persistent osteonecrosis of the jaw NECK: supple, thyroid normal size, non-tender, without nodularity LYMPH:  no palpable lymphadenopathy in the cervical, axillary or inguinal LUNGS: clear to auscultation and percussion with normal breathing effort HEART: regular rate & rhythm and no murmurs and no lower extremity edema ABDOMEN:abdomen soft, non-tender and normal bowel sounds Musculoskeletal:no cyanosis of digits and no clubbing  NEURO: alert & oriented x 3 with fluent speech, no focal motor/sensory deficits  LABORATORY DATA:  I have reviewed the data as listed    Component Value Date/Time   NA 143 10/12/2013 1307   NA 139 07/10/2013 1001   K 4.2 10/12/2013 1307   K 3.9 07/10/2013 1001   CL 106 07/10/2013 1001   CL 105 02/23/2013 1334   CO2 26 10/12/2013 1307   CO2 27 07/10/2013 1001   GLUCOSE 88 10/12/2013 1307   GLUCOSE 85 07/10/2013 1001   GLUCOSE 83 02/23/2013 1334   GLUCOSE 96 09/30/2006 1322   BUN 13.6 10/12/2013 1307   BUN 18 07/10/2013 1001   CREATININE 1.2* 10/12/2013 1307   CREATININE 1.16* 07/10/2013 1001   CREATININE 1.17* 03/23/2013 1155   CALCIUM 9.4 10/12/2013 1307   CALCIUM 8.8 07/10/2013 1001    CALCIUM 11.8* 04/04/2012 0420   PROT 6.3* 10/12/2013 1307   PROT 6.3 11/25/2012 1050   ALBUMIN 3.2* 10/12/2013 1307   ALBUMIN 4.2 11/25/2012 1050   AST 18 10/12/2013 1307   AST 13 11/25/2012 1050   ALT 18 10/12/2013 1307   ALT 16 11/25/2012 1050   ALKPHOS 54 10/12/2013 1307   ALKPHOS 38* 11/25/2012 1050   BILITOT 0.43 10/12/2013 1307   BILITOT 1.0 11/25/2012 1050   GFRNONAA 51* 11/27/2012 1237   GFRAA 59* 11/27/2012 1237    No results found for this basename: SPEP,  UPEP,   kappa and lambda light chains    Lab Results  Component Value Date   WBC 4.7 10/12/2013   NEUTROABS 2.4 10/12/2013   HGB 11.2* 10/12/2013   HCT 33.7* 10/12/2013   MCV 96.5 10/12/2013  PLT 166 10/12/2013      Chemistry      Component Value Date/Time   NA 143 10/12/2013 1307   NA 139 07/10/2013 1001   K 4.2 10/12/2013 1307   K 3.9 07/10/2013 1001   CL 106 07/10/2013 1001   CL 105 02/23/2013 1334   CO2 26 10/12/2013 1307   CO2 27 07/10/2013 1001   BUN 13.6 10/12/2013 1307   BUN 18 07/10/2013 1001   CREATININE 1.2* 10/12/2013 1307   CREATININE 1.16* 07/10/2013 1001   CREATININE 1.17* 03/23/2013 1155      Component Value Date/Time   CALCIUM 9.4 10/12/2013 1307   CALCIUM 8.8 07/10/2013 1001   CALCIUM 11.8* 04/04/2012 0420   ALKPHOS 54 10/12/2013 1307   ALKPHOS 38* 11/25/2012 1050   AST 18 10/12/2013 1307   AST 13 11/25/2012 1050   ALT 18 10/12/2013 1307   ALT 16 11/25/2012 1050   BILITOT 0.43 10/12/2013 1307   BILITOT 1.0 11/25/2012 1050     ASSESSMENT & PLAN:  #1 multiple myeloma I have discontinued her Zometa recently due to osteonecrosis of the jaw. She is due to start another cycle of Revlimid. Her most recent blood work and urine tests confirmed she is in very good partial response I recommend to continue on current treatment program which consists of dexamethasone 4 mg once a week by mouth and Revlimid to 10 mg dose 21 days on, 7 days off. #2 osteonecrosis of the jaw I have discontinued her Zometa. She will continue oral  care with a dentist. #3 back pain Continue to recommend she take calcium and vitamin D supplement. #4 leukopenia Since we reduced the dose of Revlimid, this has resolved. #5 anemia This is likely due to recent treatment. Since I reduced the dose of Revlimid, this has improved. The patient denies recent history of bleeding such as epistaxis, hematuria or hematochezia. She is asymptomatic from the anemia. I will observe for now.  She does not require transfusion now. I will continue the chemotherapy at current dose without dosage adjustment.  If the anemia gets progressive worse in the future, I might have to delay her treatment or adjust the chemotherapy dose.    Orders Placed This Encounter  Procedures  . CBC with Differential    Standing Status: Future     Number of Occurrences:      Standing Expiration Date: 07/04/2014  . Comprehensive metabolic panel    Standing Status: Future     Number of Occurrences:      Standing Expiration Date: 10/12/2014  . SPEP & IFE with QIG    Standing Status: Future     Number of Occurrences:      Standing Expiration Date: 10/12/2014  . Kappa/lambda light chains    Standing Status: Future     Number of Occurrences:      Standing Expiration Date: 10/12/2014  . Beta 2 microglobuline, serum    Standing Status: Future     Number of Occurrences:      Standing Expiration Date: 10/12/2014   All questions were answered. The patient knows to call the clinic with any problems, questions or concerns. No barriers to learning was detected. I spent 25 minutes counseling the patient face to face. The total time spent in the appointment was 40 minutes and more than 50% was on counseling and review of test results     [facility], [place], MD 10/14/2013 7:42 AM

## 2013-10-16 LAB — SPEP & IFE WITH QIG
ALBUMIN ELP: 54.2 % — AB (ref 55.8–66.1)
ALPHA-2-GLOBULIN: 12.7 % — AB (ref 7.1–11.8)
Alpha-1-Globulin: 5.5 % — ABNORMAL HIGH (ref 2.9–4.9)
BETA GLOBULIN: 9.1 % — AB (ref 4.7–7.2)
Beta 2: 5.2 % (ref 3.2–6.5)
Gamma Globulin: 13.3 % (ref 11.1–18.8)
IgA: 211 mg/dL (ref 69–380)
IgG (Immunoglobin G), Serum: 783 mg/dL (ref 690–1700)
IgM, Serum: 21 mg/dL — ABNORMAL LOW (ref 52–322)
M-Spike, %: 0.31 g/dL
TOTAL PROTEIN, SERUM ELECTROPHOR: 5.8 g/dL — AB (ref 6.0–8.3)

## 2013-10-16 LAB — BETA 2 MICROGLOBULIN, SERUM: Beta-2 Microglobulin: 2.74 mg/L — ABNORMAL HIGH (ref 1.01–1.73)

## 2013-10-19 ENCOUNTER — Other Ambulatory Visit: Payer: Self-pay | Admitting: *Deleted

## 2013-10-19 MED ORDER — LOSARTAN POTASSIUM 50 MG PO TABS: ORAL_TABLET | ORAL | Status: DC

## 2013-10-19 MED ORDER — POTASSIUM CHLORIDE CRYS ER 20 MEQ PO TBCR: 20.0000 meq | EXTENDED_RELEASE_TABLET | Freq: Every day | ORAL | Status: DC

## 2013-10-26 ENCOUNTER — Other Ambulatory Visit: Payer: Self-pay | Admitting: *Deleted

## 2013-10-26 DIAGNOSIS — C9000 Multiple myeloma not having achieved remission: Secondary | ICD-10-CM

## 2013-10-26 MED ORDER — LENALIDOMIDE 10 MG PO CAPS: ORAL_CAPSULE | ORAL | Status: DC

## 2013-10-26 NOTE — Telephone Encounter (Signed)
THIS REFILL REQUEST FOR REVLIMID WAS PLACED IN DR.GORSUCH'S ACTIVE WORK FOLDER. 

## 2013-10-27 NOTE — Telephone Encounter (Signed)
RECEIVED A FAX FROM [facility] CONCERNING A CONFIRMATION OF FACSIMILE RECEIPT FOR PT. REFERRAL. 

## 2013-10-31 NOTE — Telephone Encounter (Signed)
RECEIVED A FAX FROM [facility] CONCERNING A CONFIRMATION OF PRESCRIPTION SHIPMENT FOR REVLIMID ON 10/30/13. 

## 2013-11-06 ENCOUNTER — Other Ambulatory Visit: Payer: Self-pay | Admitting: Family Medicine

## 2013-11-07 ENCOUNTER — Other Ambulatory Visit: Payer: Self-pay | Admitting: Family Medicine

## 2013-11-09 ENCOUNTER — Other Ambulatory Visit: Payer: Medicare Other | Admitting: Lab

## 2013-11-09 ENCOUNTER — Other Ambulatory Visit: Payer: Self-pay | Admitting: Family Medicine

## 2013-11-16 ENCOUNTER — Ambulatory Visit (HOSPITAL_BASED_OUTPATIENT_CLINIC_OR_DEPARTMENT_OTHER): Payer: Medicare Other | Admitting: Hematology and Oncology

## 2013-11-16 ENCOUNTER — Telehealth: Payer: Self-pay | Admitting: Hematology and Oncology

## 2013-11-16 ENCOUNTER — Other Ambulatory Visit (HOSPITAL_BASED_OUTPATIENT_CLINIC_OR_DEPARTMENT_OTHER): Payer: Medicare Other

## 2013-11-16 VITALS — BP 152/77 | HR 69 | Temp 97.8°F | Resp 18 | Ht 65.0 in | Wt 148.4 lb

## 2013-11-16 DIAGNOSIS — D696 Thrombocytopenia, unspecified: Secondary | ICD-10-CM

## 2013-11-16 DIAGNOSIS — M549 Dorsalgia, unspecified: Secondary | ICD-10-CM

## 2013-11-16 DIAGNOSIS — D72819 Decreased white blood cell count, unspecified: Secondary | ICD-10-CM

## 2013-11-16 DIAGNOSIS — M8708 Idiopathic aseptic necrosis of bone, other site: Secondary | ICD-10-CM

## 2013-11-16 DIAGNOSIS — C9000 Multiple myeloma not having achieved remission: Secondary | ICD-10-CM

## 2013-11-16 LAB — CBC WITH DIFFERENTIAL/PLATELET
BASO%: 1.9 % (ref 0.0–2.0)
Basophils Absolute: 0.1 10*3/uL (ref 0.0–0.1)
EOS%: 6.9 % (ref 0.0–7.0)
Eosinophils Absolute: 0.4 10*3/uL (ref 0.0–0.5)
HCT: 36 % (ref 34.8–46.6)
HGB: 11.8 g/dL (ref 11.6–15.9)
LYMPH%: 29.3 % (ref 14.0–49.7)
MCH: 31.3 pg (ref 25.1–34.0)
MCHC: 32.8 g/dL (ref 31.5–36.0)
MCV: 95.4 fL (ref 79.5–101.0)
MONO#: 0.4 10*3/uL (ref 0.1–0.9)
MONO%: 7 % (ref 0.0–14.0)
NEUT#: 2.9 10*3/uL (ref 1.5–6.5)
NEUT%: 54.9 % (ref 38.4–76.8)
Platelets: 142 10*3/uL — ABNORMAL LOW (ref 145–400)
RBC: 3.77 10*6/uL (ref 3.70–5.45)
RDW: 15.3 % — AB (ref 11.2–14.5)
WBC: 5.2 10*3/uL (ref 3.9–10.3)
lymph#: 1.5 10*3/uL (ref 0.9–3.3)

## 2013-11-16 LAB — COMPREHENSIVE METABOLIC PANEL (CC13)
ALT: 24 U/L (ref 0–55)
AST: 20 U/L (ref 5–34)
Albumin: 3.4 g/dL — ABNORMAL LOW (ref 3.5–5.0)
Alkaline Phosphatase: 55 U/L (ref 40–150)
Anion Gap: 9 mEq/L (ref 3–11)
BUN: 20.4 mg/dL (ref 7.0–26.0)
CO2: 26 mEq/L (ref 22–29)
CREATININE: 1 mg/dL (ref 0.6–1.1)
Calcium: 9.8 mg/dL (ref 8.4–10.4)
Chloride: 106 mEq/L (ref 98–109)
Glucose: 82 mg/dl (ref 70–140)
Potassium: 4 mEq/L (ref 3.5–5.1)
SODIUM: 141 meq/L (ref 136–145)
TOTAL PROTEIN: 6.3 g/dL — AB (ref 6.4–8.3)
Total Bilirubin: 0.52 mg/dL (ref 0.20–1.20)

## 2013-11-16 NOTE — Progress Notes (Signed)
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    04/08/2012 Bone Marrow Biopsy BM biopsy showed 92% plasma cell, loss chromosome 8 and 13, addition of chromosome 8p, 14q and Myeloma FISH showed 13q and extra chromosome 14   04/12/2012 - 10/20/2012 Chemotherapy Velcade 3 weeks on 1 week off, Revlimid 15 mg PO days 1-21 days, dexamethasone 40 mg weekly.    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    INTERVAL HISTORY: [name] 78 y.o. female returns for further followup. She is currently undergoing treatment for osteonecrosis of the jaw. According to the patient it is improving. She complained of some insomnia at nighttime. She is also hungry all the time eating a lot of food. She denies any new bone pain. The patient denies any mouth sores, nausea, vomiting or change in bowel habits  I have reviewed the past medical history, past surgical history, social history and family history with the patient and  they are unchanged from previous note.  ALLERGIES:  is allergic to simvastatin.  MEDICATIONS:  Current Outpatient Prescriptions  Medication Sig Dispense Refill  . acetaminophen (TYLENOL) 500 MG tablet Take 500-1,000 mg by mouth every 4 (four) hours as needed. No more than 6 tabs in one day.      . ALPRAZolam (XANAX) 1 MG tablet TAKE 1/2 TO 1 TABLET DAILY AS NEEDED FOR SLEEP OR ANXIETY  30 tablet  0  . aspirin 81 MG tablet Take 81 mg by mouth daily.      . Calcium Carbonate-Vitamin D (CALCIUM 500 + D PO) Take 1 tablet by mouth 2 (two) times daily.      . Cholecalciferol (VITAMIN D-3) 5000 UNITS TABS Take 1 tablet by mouth every other day.      . CRESTOR 10 MG tablet TAKE 1 TABLET AT BEDTIME.  30 tablet  3  . dexamethasone (DECADRON) 4 MG tablet 4 mg. Take 2 tablets once a week in the morning with food      . lenalidomide (REVLIMID) 10 MG capsule Daily for 21 days, then 7 days off  21 capsule  0  . losartan (COZAAR) 50 MG tablet TAKE 1 TABLET DAILY.  30 tablet  2  . Multiple Vitamin (MULTIVITAMIN WITH MINERALS) TABS Take 1 tablet by mouth daily.      . ondansetron (ZOFRAN) 4 MG tablet Take 4 mg by mouth every 4 (four) hours as needed for nausea. Take 1 tablet every 8 hours as needed for nausea      .  potassium chloride SA (K-DUR,KLOR-CON) 20 MEQ tablet Take 1 tablet (20 mEq total) by mouth daily.  30 tablet  1  . prochlorperazine (COMPAZINE) 10 MG tablet Take 10 mg by mouth every 6 (six) hours as needed. Take 1 tablet every 6 hours as directed      . traMADol (ULTRAM) 50 MG tablet Take 1 tablet (50 mg total) by mouth every 8 (eight) hours as needed for pain.  60 tablet  3   No current facility-administered medications for this visit.    REVIEW OF SYSTEMS:   Constitutional: Denies fevers, chills or abnormal weight loss Eyes: Denies blurriness of vision Ears, nose, mouth, throat, and face: Denies mucositis or sore throat Respiratory: Denies cough, dyspnea or wheezes Cardiovascular: Denies  palpitation, chest discomfort or lower extremity swelling Gastrointestinal:  Denies nausea, heartburn or change in bowel habits Skin: Denies abnormal skin rashes Lymphatics: Denies new lymphadenopathy or easy bruising Neurological:Denies numbness, tingling or new weaknesses Behavioral/Psych: Mood is stable, no new changes  All other systems were reviewed with the patient and are negative.  PHYSICAL EXAMINATION: ECOG PERFORMANCE STATUS: 0 - Asymptomatic  Filed Vitals:   11/16/13 1444  BP: 152/77  Pulse: 69  Temp: 97.8 F (36.6 C)  Resp: 18   Filed Weights   11/16/13 1444  Weight: 148 lb 6.4 oz (67.314 kg)    GENERAL:alert, no distress and comfortable SKIN: skin color, texture, turgor are normal, no rashes or significant lesions EYES: normal, Conjunctiva are pink and non-injected, sclera clear OROPHARYNX:no exudate, no erythema and lips, buccal mucosa, and tongue normal . Persistent osteonecrosis of the jaw but not worse NECK: supple, thyroid normal size, non-tender, without nodularity LYMPH:  no palpable lymphadenopathy in the cervical, axillary or inguinal LUNGS: clear to auscultation and percussion with normal breathing effort HEART: regular rate & rhythm and no murmurs and no lower extremity edema ABDOMEN:abdomen soft, non-tender and normal bowel sounds Musculoskeletal:no cyanosis of digits and no clubbing  NEURO: alert & oriented x 3 with fluent speech, no focal motor/sensory deficits  LABORATORY DATA:  I have reviewed the data as listed    Component Value Date/Time   NA 141 11/16/2013 1433   NA 139 07/10/2013 1001   K 4.0 11/16/2013 1433   K 3.9 07/10/2013 1001   CL 106 07/10/2013 1001   CL 105 02/23/2013 1334   CO2 26 11/16/2013 1433   CO2 27 07/10/2013 1001   GLUCOSE 82 11/16/2013 1433   GLUCOSE 85 07/10/2013 1001   GLUCOSE 83 02/23/2013 1334   GLUCOSE 96 09/30/2006 1322   BUN 20.4 11/16/2013 1433   BUN 18 07/10/2013 1001   CREATININE 1.0 11/16/2013 1433    CREATININE 1.16* 07/10/2013 1001   CREATININE 1.17* 03/23/2013 1155   CALCIUM 9.8 11/16/2013 1433   CALCIUM 8.8 07/10/2013 1001   CALCIUM 11.8* 04/04/2012 0420   PROT 6.3* 11/16/2013 1433   PROT 6.3 11/25/2012 1050   ALBUMIN 3.4* 11/16/2013 1433   ALBUMIN 4.2 11/25/2012 1050   AST 20 11/16/2013 1433   AST 13 11/25/2012 1050   ALT 24 11/16/2013 1433   ALT 16 11/25/2012 1050   ALKPHOS 55 11/16/2013 1433   ALKPHOS 38* 11/25/2012 1050   BILITOT 0.52 11/16/2013 1433   BILITOT 1.0 11/25/2012 1050   GFRNONAA 51* 11/27/2012 1237   GFRAA 59* 11/27/2012 1237    No results found for this basename: SPEP,  UPEP,   kappa and lambda light chains    Lab Results  Component Value Date  WBC 5.2 11/16/2013   NEUTROABS 2.9 11/16/2013   HGB 11.8 11/16/2013   HCT 36.0 11/16/2013   MCV 95.4 11/16/2013   PLT 142* 11/16/2013      Chemistry      Component Value Date/Time   NA 141 11/16/2013 1433   NA 139 07/10/2013 1001   K 4.0 11/16/2013 1433   K 3.9 07/10/2013 1001   CL 106 07/10/2013 1001   CL 105 02/23/2013 1334   CO2 26 11/16/2013 1433   CO2 27 07/10/2013 1001   BUN 20.4 11/16/2013 1433   BUN 18 07/10/2013 1001   CREATININE 1.0 11/16/2013 1433   CREATININE 1.16* 07/10/2013 1001   CREATININE 1.17* 03/23/2013 1155      Component Value Date/Time   CALCIUM 9.8 11/16/2013 1433   CALCIUM 8.8 07/10/2013 1001   CALCIUM 11.8* 04/04/2012 0420   ALKPHOS 55 11/16/2013 1433   ALKPHOS 38* 11/25/2012 1050   AST 20 11/16/2013 1433   AST 13 11/25/2012 1050   ALT 24 11/16/2013 1433   ALT 16 11/25/2012 1050   BILITOT 0.52 11/16/2013 1433   BILITOT 1.0 11/25/2012 1050    ASSESSMENT & PLAN:  #1 multiple myeloma I have discontinued her Zometa recently due to osteonecrosis of the jaw. Her most recent blood work and urine tests confirmed she is in very good partial response I recommend to continue on current treatment program with Revlimid to 10 mg dose 21 days on, 7 days off. I have discontinued dexamethasone from today onwards #2  osteonecrosis of the jaw I have discontinued her Zometa. She will continue oral care with a dentist. #3 back pain Continue to recommend she take calcium and vitamin D supplement. #4 leukopenia Since we reduced the dose of Revlimid, this has resolved. #5 mild thrombocytopenia This is likely due to recent treatment. I will observe for now.  She does not require transfusion now. I will continue the chemotherapy at current dose without dosage adjustment.  If the thrombocytopenia gets progressive worse in the future, I might have to delay her treatment or adjust the chemotherapy dose. #6 insomnia and polyphagia I would discontinue dexamethasone.  Orders Placed This Encounter  Procedures  . CBC with Differential    Standing Status: Future     Number of Occurrences:      Standing Expiration Date: 11/16/2014  . Comprehensive metabolic panel    Standing Status: Future     Number of Occurrences:      Standing Expiration Date: 11/16/2014  . SPEP & IFE with QIG    Standing Status: Future     Number of Occurrences:      Standing Expiration Date: 11/16/2014  . Kappa/lambda light chains    Standing Status: Future     Number of Occurrences:      Standing Expiration Date: 11/16/2014   All questions were answered. The patient knows to call the clinic with any problems, questions or concerns. No barriers to learning was detected. I spent 25 minutes counseling the patient face to face. The total time spent in the appointment was 40 minutes and more than 50% was on counseling and review of test results     [facility], [place], MD 11/16/2013 3:55 PM

## 2013-11-16 NOTE — Telephone Encounter (Signed)
, °

## 2013-11-17 ENCOUNTER — Other Ambulatory Visit: Payer: Self-pay | Admitting: *Deleted

## 2013-11-17 MED ORDER — POTASSIUM CHLORIDE CRYS ER 20 MEQ PO TBCR: 20.0000 meq | EXTENDED_RELEASE_TABLET | Freq: Every day | ORAL | Status: DC

## 2013-11-20 LAB — SPEP & IFE WITH QIG
ALBUMIN ELP: 55.4 % — AB (ref 55.8–66.1)
ALPHA-1-GLOBULIN: 7 % — AB (ref 2.9–4.9)
ALPHA-2-GLOBULIN: 12.2 % — AB (ref 7.1–11.8)
Beta 2: 3.7 % (ref 3.2–6.5)
Beta Globulin: 10.4 % — ABNORMAL HIGH (ref 4.7–7.2)
Gamma Globulin: 11.3 % (ref 11.1–18.8)
IGM, SERUM: 12 mg/dL — AB (ref 52–322)
IgA: 282 mg/dL (ref 69–380)
IgG (Immunoglobin G), Serum: 786 mg/dL (ref 690–1700)
M-Spike, %: 0.37 g/dL
Total Protein, Serum Electrophoresis: 5.9 g/dL — ABNORMAL LOW (ref 6.0–8.3)

## 2013-11-20 LAB — KAPPA/LAMBDA LIGHT CHAINS
KAPPA FREE LGHT CHN: 2.4 mg/dL — AB (ref 0.33–1.94)
Kappa:Lambda Ratio: 1.48 (ref 0.26–1.65)
Lambda Free Lght Chn: 1.62 mg/dL (ref 0.57–2.63)

## 2013-11-20 LAB — BETA 2 MICROGLOBULIN, SERUM: BETA 2 MICROGLOBULIN: 3.17 mg/L — AB (ref 1.01–1.73)

## 2013-11-27 ENCOUNTER — Other Ambulatory Visit: Payer: Self-pay | Admitting: *Deleted

## 2013-11-27 DIAGNOSIS — C9000 Multiple myeloma not having achieved remission: Secondary | ICD-10-CM

## 2013-11-27 MED ORDER — LENALIDOMIDE 10 MG PO CAPS: ORAL_CAPSULE | ORAL | Status: DC

## 2013-12-04 NOTE — Telephone Encounter (Signed)
RECEIVED A FAX FROM [facility] CONCERNING A CONFIRMATION OF PRESCRIPTION SHIPMENT FOR REVLIMID ON 12/01/13.

## 2013-12-07 ENCOUNTER — Other Ambulatory Visit: Payer: Medicare Other | Admitting: Lab

## 2013-12-07 ENCOUNTER — Other Ambulatory Visit: Payer: Self-pay | Admitting: *Deleted

## 2013-12-07 MED ORDER — ALPRAZOLAM 1 MG PO TABS: ORAL_TABLET | ORAL | Status: DC

## 2013-12-18 ENCOUNTER — Other Ambulatory Visit: Payer: Self-pay | Admitting: Family Medicine

## 2013-12-21 ENCOUNTER — Telehealth: Payer: Self-pay | Admitting: Hematology and Oncology

## 2013-12-21 ENCOUNTER — Other Ambulatory Visit (HOSPITAL_BASED_OUTPATIENT_CLINIC_OR_DEPARTMENT_OTHER): Payer: Medicare Other

## 2013-12-21 ENCOUNTER — Ambulatory Visit (HOSPITAL_BASED_OUTPATIENT_CLINIC_OR_DEPARTMENT_OTHER): Payer: Medicare Other | Admitting: Hematology and Oncology

## 2013-12-21 VITALS — BP 153/81 | HR 65 | Temp 98.0°F | Resp 18 | Ht 65.0 in | Wt 151.3 lb

## 2013-12-21 DIAGNOSIS — M8708 Idiopathic aseptic necrosis of bone, other site: Secondary | ICD-10-CM

## 2013-12-21 DIAGNOSIS — C9000 Multiple myeloma not having achieved remission: Secondary | ICD-10-CM

## 2013-12-21 DIAGNOSIS — Z7982 Long term (current) use of aspirin: Secondary | ICD-10-CM

## 2013-12-21 LAB — CBC WITH DIFFERENTIAL/PLATELET
BASO%: 1.4 % (ref 0.0–2.0)
Basophils Absolute: 0.1 10*3/uL (ref 0.0–0.1)
EOS%: 10.8 % — AB (ref 0.0–7.0)
Eosinophils Absolute: 0.5 10*3/uL (ref 0.0–0.5)
HEMATOCRIT: 35.6 % (ref 34.8–46.6)
HGB: 11.7 g/dL (ref 11.6–15.9)
LYMPH%: 34.2 % (ref 14.0–49.7)
MCH: 31.2 pg (ref 25.1–34.0)
MCHC: 33 g/dL (ref 31.5–36.0)
MCV: 94.5 fL (ref 79.5–101.0)
MONO#: 0.4 10*3/uL (ref 0.1–0.9)
MONO%: 9.4 % (ref 0.0–14.0)
NEUT#: 2 10*3/uL (ref 1.5–6.5)
NEUT%: 44.2 % (ref 38.4–76.8)
Platelets: 147 10*3/uL (ref 145–400)
RBC: 3.76 10*6/uL (ref 3.70–5.45)
RDW: 15.1 % — ABNORMAL HIGH (ref 11.2–14.5)
WBC: 4.6 10*3/uL (ref 3.9–10.3)
lymph#: 1.6 10*3/uL (ref 0.9–3.3)

## 2013-12-21 LAB — COMPREHENSIVE METABOLIC PANEL (CC13)
ALK PHOS: 49 U/L (ref 40–150)
ALT: 23 U/L (ref 0–55)
ANION GAP: 10 meq/L (ref 3–11)
AST: 19 U/L (ref 5–34)
Albumin: 3.4 g/dL — ABNORMAL LOW (ref 3.5–5.0)
BILIRUBIN TOTAL: 0.46 mg/dL (ref 0.20–1.20)
BUN: 17.1 mg/dL (ref 7.0–26.0)
CO2: 24 meq/L (ref 22–29)
CREATININE: 1 mg/dL (ref 0.6–1.1)
Calcium: 9.2 mg/dL (ref 8.4–10.4)
Chloride: 107 mEq/L (ref 98–109)
GLUCOSE: 93 mg/dL (ref 70–140)
Potassium: 4.3 mEq/L (ref 3.5–5.1)
Sodium: 142 mEq/L (ref 136–145)
Total Protein: 6.4 g/dL (ref 6.4–8.3)

## 2013-12-21 NOTE — Telephone Encounter (Signed)
gv pt appt schedule for april.  °

## 2013-12-22 NOTE — Progress Notes (Signed)
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    04/08/2012 Bone Marrow Biopsy BM biopsy showed 92% plasma cell, loss chromosome 8 and 13, addition of chromosome 8p, 14q and Myeloma FISH showed 13q and extra chromosome 14   04/12/2012 - 10/20/2012 Chemotherapy Velcade 3 weeks on 1 week off, Revlimid 15 mg PO days 1-21 days, dexamethasone 40 mg weekly.    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    INTERVAL HISTORY: [name] 78 y.o. female returns for further followup. She is very pleased that she has new dentures placed for recent treatment of osteonecrosis of the jaw. Recently, she went to urgent care with right sided rib pain. She was diagnosed with rib fracture. She is only taking over-the-counter medications for pain. It does not interfere with her activities of daily living. Otherwise she has no new complaints  I have reviewed the past medical history, past surgical history,  social history and family history with the patient and they are unchanged from previous note.  ALLERGIES:  is allergic to simvastatin.  MEDICATIONS:  Current Outpatient Prescriptions  Medication Sig Dispense Refill  . acetaminophen (TYLENOL) 500 MG tablet Take 500-1,000 mg by mouth every 4 (four) hours as needed. No more than 6 tabs in one day.      . ALPRAZolam (XANAX) 1 MG tablet TAKE 1/2 TO 1 TABLET DAILY AS NEEDED FOR SLEEP OR ANXIETY  30 tablet  0  . aspirin 81 MG tablet Take 81 mg by mouth daily.      . Calcium Carbonate-Vitamin D (CALCIUM 500 + D PO) Take 1 tablet by mouth 2 (two) times daily.      . Cholecalciferol (VITAMIN D-3) 5000 UNITS TABS Take 1 tablet by mouth every other day.      . CRESTOR 10 MG tablet TAKE 1 TABLET AT BEDTIME.  30 tablet  3  . lenalidomide (REVLIMID) 10 MG capsule Daily for 21 days, then 7 days off  21 capsule  0  . losartan (COZAAR) 50 MG tablet TAKE 1 TABLET DAILY.  30 tablet  3  . Multiple Vitamin (MULTIVITAMIN WITH MINERALS) TABS Take 1 tablet by mouth daily.      . ondansetron (ZOFRAN) 4 MG tablet Take 4 mg by mouth every 4 (four) hours as needed for nausea. Take 1 tablet every 8 hours as needed for nausea      . potassium chloride SA (K-DUR,KLOR-CON) 20 MEQ tablet Take 1 tablet (20 mEq total) by mouth daily.  [place]  tablet  1  . prochlorperazine (COMPAZINE) 10 MG tablet Take 10 mg by mouth every 6 (six) hours as needed. Take 1 tablet every 6 hours as directed      . traMADol (ULTRAM) 50 MG tablet Take 1 tablet (50 mg total) by mouth every 8 (eight) hours as needed for pain.  60 tablet  3   No current facility-administered medications for this visit.    REVIEW OF SYSTEMS:   Constitutional: Denies fevers, chills or abnormal weight loss Eyes: Denies blurriness of vision Ears, nose, mouth, throat, and face: Denies mucositis or sore throat Respiratory: Denies cough, dyspnea or wheezes Cardiovascular: Denies palpitation, chest discomfort or lower extremity  swelling Gastrointestinal:  Denies nausea, heartburn or change in bowel habits Skin: Denies abnormal skin rashes Lymphatics: Denies new lymphadenopathy or easy bruising Neurological:Denies numbness, tingling or new weaknesses Behavioral/Psych: Mood is stable, no new changes  All other systems were reviewed with the patient and are negative.  PHYSICAL EXAMINATION: ECOG PERFORMANCE STATUS: 1 - Symptomatic but completely ambulatory  Filed Vitals:   12/21/13 1337  BP: 153/81  Pulse: 65  Temp: 98 F (36.7 C)  Resp: 18   Filed Weights   12/21/13 1337  Weight: 151 lb 4.8 oz (68.629 kg)    GENERAL:alert, no distress and comfortable SKIN: skin color, texture, turgor are normal, no rashes or significant lesions EYES: normal, Conjunctiva are pink and non-injected, sclera clear OROPHARYNX:no exudate, no erythema and lips, buccal mucosa, and tongue normal . She has new dentures in place. No other signs of osteonecrosis of the jaw. NECK: supple, thyroid normal size, non-tender, without nodularity LYMPH:  no palpable lymphadenopathy in the cervical, axillary or inguinal LUNGS: clear to auscultation and percussion with normal breathing effort HEART: regular rate & rhythm and no murmurs and no lower extremity edema ABDOMEN:abdomen soft, non-tender and normal bowel sounds Musculoskeletal:no cyanosis of digits and no clubbing. Mild tenderness some palpation on the right lower rib cage area  NEURO: alert & oriented x 3 with fluent speech, no focal motor/sensory deficits  LABORATORY DATA:  I have reviewed the data as listed    Component Value Date/Time   NA 142 12/21/2013 1320   NA 139 07/10/2013 1001   K 4.3 12/21/2013 1320   K 3.9 07/10/2013 1001   CL 106 07/10/2013 1001   CL 105 02/23/2013 1334   CO2 24 12/21/2013 1320   CO2 27 07/10/2013 1001   GLUCOSE 93 12/21/2013 1320   GLUCOSE 85 07/10/2013 1001   GLUCOSE 83 02/23/2013 1334   GLUCOSE 96 09/30/2006 1322   BUN 17.1 12/21/2013 1320   BUN  18 07/10/2013 1001   CREATININE 1.0 12/21/2013 1320   CREATININE 1.16* 07/10/2013 1001   CREATININE 1.17* 03/23/2013 1155   CALCIUM 9.2 12/21/2013 1320   CALCIUM 8.8 07/10/2013 1001   CALCIUM 11.8* 04/04/2012 0420   PROT 6.4 12/21/2013 1320   PROT 6.3 11/25/2012 1050   ALBUMIN 3.4* 12/21/2013 1320   ALBUMIN 4.2 11/25/2012 1050   AST 19 12/21/2013 1320   AST 13 11/25/2012 1050   ALT 23 12/21/2013 1320   ALT 16 11/25/2012 1050   ALKPHOS 49 12/21/2013 1320   ALKPHOS 38* 11/25/2012 1050   BILITOT 0.46 12/21/2013 1320   BILITOT 1.0 11/25/2012 1050   GFRNONAA 46* 07/10/2013 1001   GFRNONAA 51* 11/27/2012 1237   GFRAA 53* 07/10/2013 1001   GFRAA 59* 11/27/2012 1237    No results found for this basename: SPEP,  UPEP,   kappa  and lambda light chains    Lab Results  Component Value Date   WBC 4.6 12/21/2013   NEUTROABS 2.0 12/21/2013   HGB 11.7 12/21/2013   HCT 35.6 12/21/2013   MCV 94.5 12/21/2013   PLT 147 12/21/2013      Chemistry      Component Value Date/Time   NA 142 12/21/2013 1320   NA 139 07/10/2013 1001   K 4.3 12/21/2013 1320   K 3.9 07/10/2013 1001   CL 106 07/10/2013 1001   CL 105 02/23/2013 1334   CO2 24 12/21/2013 1320   CO2 27 07/10/2013 1001   BUN 17.1 12/21/2013 1320   BUN 18 07/10/2013 1001   CREATININE 1.0 12/21/2013 1320   CREATININE 1.16* 07/10/2013 1001   CREATININE 1.17* 03/23/2013 1155      Component Value Date/Time   CALCIUM 9.2 12/21/2013 1320   CALCIUM 8.8 07/10/2013 1001   CALCIUM 11.8* 04/04/2012 0420   ALKPHOS 49 12/21/2013 1320   ALKPHOS 38* 11/25/2012 1050   AST 19 12/21/2013 1320   AST 13 11/25/2012 1050   ALT 23 12/21/2013 1320   ALT 16 11/25/2012 1050   BILITOT 0.46 12/21/2013 1320   BILITOT 1.0 11/25/2012 1050     ASSESSMENT & PLAN:  #1 multiple myeloma I have discontinued her Zometa recently due to osteonecrosis of the jaw. Her most recent blood work and urine tests confirmed she is in very good partial response I recommend to continue on current treatment  program with Revlimid to 10 mg dose 21 days on, 7 days off. I have discontinued dexamethasone since February 2015.  #2 osteonecrosis of the jaw I have discontinued her Zometa. She will continue oral care with a dentist. #3 with pain and back pain Continue to recommend she take calcium and vitamin D supplement. #4 DVT prophylaxis She will continue on aspirin. Orders Placed This Encounter  Procedures  . Comprehensive metabolic panel    Standing Status: Future     Number of Occurrences:      Standing Expiration Date: 12/21/2014  . CBC with Differential    Standing Status: Future     Number of Occurrences:      Standing Expiration Date: 12/21/2014  . SPEP & IFE with QIG    Standing Status: Future     Number of Occurrences:      Standing Expiration Date: 12/21/2014  . Kappa/lambda light chains    Standing Status: Future     Number of Occurrences:      Standing Expiration Date: 12/21/2014   All questions were answered. The patient knows to call the clinic with any problems, questions or concerns. No barriers to learning was detected. I spent 20 minutes counseling the patient face to face. The total time spent in the appointment was 30 minutes and more than 50% was on counseling and review of test results     [facility], [place], MD 12/22/2013 8:06 AM

## 2013-12-25 LAB — SPEP & IFE WITH QIG
Albumin ELP: 56.9 % (ref 55.8–66.1)
Alpha-1-Globulin: 6.8 % — ABNORMAL HIGH (ref 2.9–4.9)
Alpha-2-Globulin: 11.3 % (ref 7.1–11.8)
Beta 2: 2.7 % — ABNORMAL LOW (ref 3.2–6.5)
Beta Globulin: 10.9 % — ABNORMAL HIGH (ref 4.7–7.2)
Gamma Globulin: 11.4 % (ref 11.1–18.8)
IGA: 251 mg/dL (ref 69–380)
IGG (IMMUNOGLOBIN G), SERUM: 692 mg/dL (ref 690–1700)
IGM, SERUM: 9 mg/dL — AB (ref 52–322)
M-SPIKE, %: 0.4 g/dL
TOTAL PROTEIN, SERUM ELECTROPHOR: 5.9 g/dL — AB (ref 6.0–8.3)

## 2013-12-25 LAB — KAPPA/LAMBDA LIGHT CHAINS
KAPPA FREE LGHT CHN: 2.64 mg/dL — AB (ref 0.33–1.94)
Kappa:Lambda Ratio: 1.98 — ABNORMAL HIGH (ref 0.26–1.65)
LAMBDA FREE LGHT CHN: 1.33 mg/dL (ref 0.57–2.63)

## 2013-12-27 ENCOUNTER — Other Ambulatory Visit: Payer: Self-pay | Admitting: *Deleted

## 2013-12-27 NOTE — Telephone Encounter (Signed)
Refill request for Revlimid to MD desk.

## 2013-12-28 ENCOUNTER — Other Ambulatory Visit: Payer: Self-pay | Admitting: *Deleted

## 2013-12-28 DIAGNOSIS — C9000 Multiple myeloma not having achieved remission: Secondary | ICD-10-CM

## 2013-12-28 MED ORDER — LENALIDOMIDE 10 MG PO CAPS: ORAL_CAPSULE | ORAL | Status: DC

## 2013-12-29 NOTE — Telephone Encounter (Signed)
RECEIVED A FAX FROM [facility] CONCERNING A CONFIRMATION OF FACSIMILE RECEIPT FOR PT. REFERRAL. 

## 2014-01-02 ENCOUNTER — Telehealth: Payer: Self-pay | Admitting: Hematology and Oncology

## 2014-01-02 NOTE — Telephone Encounter (Signed)
Fax received from [facility] that Revlimid  was shipped on 01/01/14.

## 2014-01-02 NOTE — Telephone Encounter (Signed)
per NG appt chgd to 4/29-cld and spoke w/pt in regards to new time & date-pt understood

## 2014-01-04 ENCOUNTER — Other Ambulatory Visit: Payer: Self-pay | Admitting: *Deleted

## 2014-01-04 ENCOUNTER — Other Ambulatory Visit: Payer: Medicare Other | Admitting: Lab

## 2014-01-04 MED ORDER — ALPRAZOLAM 1 MG PO TABS: ORAL_TABLET | ORAL | Status: DC

## 2014-01-10 ENCOUNTER — Other Ambulatory Visit: Payer: Self-pay | Admitting: Family Medicine

## 2014-01-10 NOTE — Telephone Encounter (Signed)
Open in error

## 2014-01-24 ENCOUNTER — Other Ambulatory Visit (HOSPITAL_BASED_OUTPATIENT_CLINIC_OR_DEPARTMENT_OTHER): Payer: Medicare Other

## 2014-01-24 ENCOUNTER — Telehealth: Payer: Self-pay | Admitting: Hematology and Oncology

## 2014-01-24 ENCOUNTER — Telehealth: Payer: Self-pay | Admitting: *Deleted

## 2014-01-24 ENCOUNTER — Other Ambulatory Visit: Payer: Self-pay | Admitting: *Deleted

## 2014-01-24 ENCOUNTER — Encounter: Payer: Self-pay | Admitting: Hematology and Oncology

## 2014-01-24 ENCOUNTER — Ambulatory Visit (HOSPITAL_BASED_OUTPATIENT_CLINIC_OR_DEPARTMENT_OTHER): Payer: Medicare Other | Admitting: Hematology and Oncology

## 2014-01-24 VITALS — BP 149/89 | HR 77 | Temp 97.9°F | Resp 18 | Ht 65.0 in | Wt 151.6 lb

## 2014-01-24 DIAGNOSIS — M8708 Idiopathic aseptic necrosis of bone, other site: Secondary | ICD-10-CM

## 2014-01-24 DIAGNOSIS — F411 Generalized anxiety disorder: Secondary | ICD-10-CM

## 2014-01-24 DIAGNOSIS — R079 Chest pain, unspecified: Secondary | ICD-10-CM

## 2014-01-24 DIAGNOSIS — M549 Dorsalgia, unspecified: Secondary | ICD-10-CM

## 2014-01-24 DIAGNOSIS — C9000 Multiple myeloma not having achieved remission: Secondary | ICD-10-CM

## 2014-01-24 DIAGNOSIS — D72819 Decreased white blood cell count, unspecified: Secondary | ICD-10-CM

## 2014-01-24 DIAGNOSIS — G4700 Insomnia, unspecified: Secondary | ICD-10-CM

## 2014-01-24 LAB — COMPREHENSIVE METABOLIC PANEL (CC13)
ALBUMIN: 3.4 g/dL — AB (ref 3.5–5.0)
ALK PHOS: 54 U/L (ref 40–150)
ALT: 17 U/L (ref 0–55)
AST: 19 U/L (ref 5–34)
Anion Gap: 11 mEq/L (ref 3–11)
BUN: 12.9 mg/dL (ref 7.0–26.0)
CALCIUM: 9.8 mg/dL (ref 8.4–10.4)
CO2: 25 mEq/L (ref 22–29)
CREATININE: 1.1 mg/dL (ref 0.6–1.1)
Chloride: 106 mEq/L (ref 98–109)
Glucose: 87 mg/dl (ref 70–140)
Potassium: 3.9 mEq/L (ref 3.5–5.1)
Sodium: 142 mEq/L (ref 136–145)
Total Bilirubin: 0.83 mg/dL (ref 0.20–1.20)
Total Protein: 6.6 g/dL (ref 6.4–8.3)

## 2014-01-24 LAB — CBC WITH DIFFERENTIAL/PLATELET
BASO%: 1.9 % (ref 0.0–2.0)
Basophils Absolute: 0.1 10*3/uL (ref 0.0–0.1)
EOS%: 16.6 % — ABNORMAL HIGH (ref 0.0–7.0)
Eosinophils Absolute: 0.6 10*3/uL — ABNORMAL HIGH (ref 0.0–0.5)
HCT: 37 % (ref 34.8–46.6)
HGB: 12.2 g/dL (ref 11.6–15.9)
LYMPH%: 36.9 % (ref 14.0–49.7)
MCH: 30.9 pg (ref 25.1–34.0)
MCHC: 32.9 g/dL (ref 31.5–36.0)
MCV: 93.8 fL (ref 79.5–101.0)
MONO#: 0.5 10*3/uL (ref 0.1–0.9)
MONO%: 12.4 % (ref 0.0–14.0)
NEUT%: 32.2 % — ABNORMAL LOW (ref 38.4–76.8)
NEUTROS ABS: 1.2 10*3/uL — AB (ref 1.5–6.5)
PLATELETS: 150 10*3/uL (ref 145–400)
RBC: 3.94 10*6/uL (ref 3.70–5.45)
RDW: 14.5 % (ref 11.2–14.5)
WBC: 3.8 10*3/uL — ABNORMAL LOW (ref 3.9–10.3)
lymph#: 1.4 10*3/uL (ref 0.9–3.3)

## 2014-01-24 MED ORDER — ACYCLOVIR 400 MG PO TABS: 400.0000 mg | ORAL_TABLET | Freq: Every day | ORAL | Status: DC

## 2014-01-24 MED ORDER — LENALIDOMIDE 5 MG PO CAPS: 5.0000 mg | ORAL_CAPSULE | Freq: Every day | ORAL | Status: DC

## 2014-01-24 MED ORDER — LIDOCAINE-PRILOCAINE 2.5-2.5 % EX CREA: 1.0000 "application " | TOPICAL_CREAM | CUTANEOUS | Status: DC | PRN

## 2014-01-24 MED ORDER — TRAZODONE HCL 50 MG PO TABS: 50.0000 mg | ORAL_TABLET | Freq: Every evening | ORAL | Status: DC | PRN

## 2014-01-24 NOTE — Telephone Encounter (Signed)
Faxed new prescription for Revlimid 5mg  - signed by md - to [facility].

## 2014-01-24 NOTE — Telephone Encounter (Signed)
gv and printed appt sched and avs for pt for May °

## 2014-01-24 NOTE — Progress Notes (Signed)
[place] OFFICE PROGRESS NOTE  Patient Care Team: Hali Marry, MD as PCP - General Marye Round, MD (Radiation Oncology) Heath Lark, MD as Consulting Physician (Hematology and Oncology)  DIAGNOSIS: Multiple myeloma  SUMMARY OF ONCOLOGIC HISTORY: Oncology History   Myeloma staging: ISS stage II (albumin 2.4, B2M 4.8) Durie-Salmon Stage III (hypercalcemia, bone lesions, renal failure)     Multiple myeloma   04/04/2012 Tumor Marker M Spike 4gm/dL, IgA 4020 mg/dL, serum kappa 2.11, urine test was positive for Bence Jones proteinemia.    04/08/2012 Bone Marrow Biopsy BM biopsy showed 92% plasma cell, loss chromosome 8 and 13, addition of chromosome 8p, 14q and Myeloma FISH showed 13q and extra chromosome 14   04/12/2012 - 10/20/2012 Chemotherapy Velcade 3 weeks on 1 week off, Revlimid 15 mg PO days 1-21 days, dexamethasone 40 mg weekly.    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    INTERVAL HISTORY: [name] 78 y.o. female returns for further followup. She complained of severe rib pain on the right side due to recent rib fracture. She denies any problem with the dentition. Her energy level is fair. She denies any recent infection. Her only main complaints are anxiety and insomnia. I have reviewed the past medical history, past surgical history, social history and family history with the patient and they are unchanged from previous note.  ALLERGIES:  is allergic to  simvastatin.  MEDICATIONS:  Current Outpatient Prescriptions  Medication Sig Dispense Refill  . acetaminophen (TYLENOL) 500 MG tablet Take 500-1,000 mg by mouth every 4 (four) hours as needed. No more than 6 tabs in one day.      . ALPRAZolam (XANAX) 1 MG tablet TAKE 1/2 TO 1 TABLET DAILY AS NEEDED FOR SLEEP OR ANXIETY  30 tablet  0  . aspirin 81 MG tablet Take 81 mg by mouth daily.      . Calcium Carbonate-Vitamin D (CALCIUM 500 + D PO) Take 1 tablet by mouth 2 (two) times daily.      . Cholecalciferol (VITAMIN D-3) 5000 UNITS TABS Take 1 tablet by mouth every other day.      . CRESTOR 10 MG tablet TAKE 1 TABLET AT BEDTIME.  30 tablet  3  . losartan (COZAAR) 50 MG tablet TAKE 1 TABLET DAILY.  30 tablet  3  . Multiple Vitamin (MULTIVITAMIN WITH MINERALS) TABS Take 1 tablet by mouth daily.      . ondansetron (ZOFRAN) 4 MG tablet Take 4 mg by mouth every 4 (four) hours as needed for nausea. Take 1 tablet every 8 hours as needed for nausea      . potassium chloride SA (K-DUR,KLOR-CON) 20 MEQ tablet Take 1 tablet (20 mEq total) by mouth daily.  60 tablet  1  . prochlorperazine (COMPAZINE) 10 MG tablet Take 10 mg by mouth every 6 (six) hours as needed. Take 1 tablet every 6 hours as directed      . traMADol (ULTRAM) 50 MG tablet Take 1  tablet (50 mg total) by mouth every 8 (eight) hours as needed for pain.  60 tablet  3  . acyclovir (ZOVIRAX) 400 MG tablet Take 1 tablet (400 mg total) by mouth daily.  30 tablet  6  . lenalidomide (REVLIMID) 5 MG capsule Take 1 capsule (5 mg total) by mouth daily. Take 1 capsule by mouth daily for 21 days ;  Rest  7  Days.  21 capsule  0  . lidocaine-prilocaine (EMLA) cream Apply 1 application topically as needed.  30 g  0  . traZODone (DESYREL) 50 MG tablet Take 1 tablet (50 mg total) by mouth at bedtime as needed for sleep.  30 tablet  1   No current facility-administered medications for this visit.    REVIEW OF SYSTEMS:   Constitutional: Denies fevers, chills  or abnormal weight loss Eyes: Denies blurriness of vision Ears, nose, mouth, throat, and face: Denies mucositis or sore throat Respiratory: Denies cough, dyspnea or wheezes Cardiovascular: Denies palpitation, chest discomfort or lower extremity swelling Gastrointestinal:  Denies nausea, heartburn or change in bowel habits Skin: Denies abnormal skin rashes Lymphatics: Denies new lymphadenopathy or easy bruising Neurological:Denies numbness, tingling or new weaknesses Behavioral/Psych: Mood is stable, no new changes  All other systems were reviewed with the patient and are negative.  PHYSICAL EXAMINATION: ECOG PERFORMANCE STATUS: 0 - Asymptomatic  Filed Vitals:   01/24/14 1308  BP: 149/89  Pulse: 77  Temp: 97.9 F (36.6 C)  Resp: 18   Filed Weights   01/24/14 1308  Weight: 151 lb 9.6 oz (68.765 kg)    GENERAL:alert, no distress and comfortable SKIN: skin color, texture, turgor are normal, no rashes or significant lesions EYES: normal, Conjunctiva are pink and non-injected, sclera clear OROPHARYNX:no exudate, no erythema and lips, buccal mucosa, and tongue normal . She has dentures. NECK: supple, thyroid normal size, non-tender, without nodularity LYMPH:  no palpable lymphadenopathy in the cervical, axillary or inguinal LUNGS: clear to auscultation and percussion with normal breathing effort HEART: regular rate & rhythm and no murmurs and no lower extremity edema ABDOMEN:abdomen soft, non-tender and normal bowel sounds Musculoskeletal:no cyanosis of digits and no clubbing . She has significant tenderness on palpation on the right flank area NEURO: alert & oriented x 3 with fluent speech, no focal motor/sensory deficits  LABORATORY DATA:  I have reviewed the data as listed    Component Value Date/Time   NA 142 01/24/2014 1245   NA 139 07/10/2013 1001   K 3.9 01/24/2014 1245   K 3.9 07/10/2013 1001   CL 106 07/10/2013 1001   CL 105 02/23/2013 1334   CO2 25 01/24/2014 1245    CO2 27 07/10/2013 1001   GLUCOSE 87 01/24/2014 1245   GLUCOSE 85 07/10/2013 1001   GLUCOSE 83 02/23/2013 1334   GLUCOSE 96 09/30/2006 1322   BUN 12.9 01/24/2014 1245   BUN 18 07/10/2013 1001   CREATININE 1.1 01/24/2014 1245   CREATININE 1.16* 07/10/2013 1001   CREATININE 1.17* 03/23/2013 1155   CALCIUM 9.8 01/24/2014 1245   CALCIUM 8.8 07/10/2013 1001   CALCIUM 11.8* 04/04/2012 0420   PROT 6.6 01/24/2014 1245   PROT 6.3 11/25/2012 1050   ALBUMIN 3.4* 01/24/2014 1245   ALBUMIN 4.2 11/25/2012 1050   AST 19 01/24/2014 1245   AST 13 11/25/2012 1050   ALT 17 01/24/2014 1245   ALT 16 11/25/2012 1050   ALKPHOS 54 01/24/2014 1245   ALKPHOS 38* 11/25/2012 1050   BILITOT 0.83 01/24/2014 1245  BILITOT 1.0 11/25/2012 1050   GFRNONAA 46* 07/10/2013 1001   GFRNONAA 51* 11/27/2012 1237   GFRAA 53* 07/10/2013 1001   GFRAA 59* 11/27/2012 1237    No results found for this basename: SPEP,  UPEP,   kappa and lambda light chains    Lab Results  Component Value Date   WBC 3.8* 01/24/2014   NEUTROABS 1.2* 01/24/2014   HGB 12.2 01/24/2014   HCT 37.0 01/24/2014   MCV 93.8 01/24/2014   PLT 150 01/24/2014      Chemistry      Component Value Date/Time   NA 142 01/24/2014 1245   NA 139 07/10/2013 1001   K 3.9 01/24/2014 1245   K 3.9 07/10/2013 1001   CL 106 07/10/2013 1001   CL 105 02/23/2013 1334   CO2 25 01/24/2014 1245   CO2 27 07/10/2013 1001   BUN 12.9 01/24/2014 1245   BUN 18 07/10/2013 1001   CREATININE 1.1 01/24/2014 1245   CREATININE 1.16* 07/10/2013 1001   CREATININE 1.17* 03/23/2013 1155      Component Value Date/Time   CALCIUM 9.8 01/24/2014 1245   CALCIUM 8.8 07/10/2013 1001   CALCIUM 11.8* 04/04/2012 0420   ALKPHOS 54 01/24/2014 1245   ALKPHOS 38* 11/25/2012 1050   AST 19 01/24/2014 1245   AST 13 11/25/2012 1050   ALT 17 01/24/2014 1245   ALT 16 11/25/2012 1050   BILITOT 0.83 01/24/2014 1245   BILITOT 1.0 11/25/2012 1050     ASSESSMENT & PLAN:  #1 multiple myeloma I have discontinued her Zometa recently  due to osteonecrosis of the jaw.  Her most recent blood work and urine tests confirmed she is in very good partial response but I am very concerned because her M spike went from undetectable to abnormally high and it continued to rise over the past few months. I recommend adding back Velcade weekly subcutaneous along with Revlimid. She is becoming leukopenic and I plan to reduce Revlimid to 5 mg, to be taken 21 days on 5 days off.  I plan to resume dexamethasone once we restart her back on Velcade.  #2 osteonecrosis of the jaw I have discontinued her Zometa. She will continue oral care with a dentist. #3 Rib pain and back pain Continue to recommend she take calcium and vitamin D supplement. I suspect this may be due to disease. #4 DVT prophylaxis She will continue on aspirin. #5 antimicrobial prophylaxis I would affect acyclovir since I will restart back on Velcade #6 poor venous access I recommend Infuse-a-Port placement and she agreed #7 anxiety and insomnia I will start her on trazodone daily.   Orders Placed This Encounter  Procedures  . IR Fluoro Guide CV Line Right    Infusport, power port    Standing Status: Future     Number of Occurrences:      Standing Expiration Date: 03/27/2015    Order Specific Question:  Reason for exam:    Answer:  need port    Order Specific Question:  Preferred Imaging Location?    Answer:  [facility]  . CBC with Differential    Standing Status: Standing     Number of Occurrences: 9     Standing Expiration Date: 01/25/2015  . Comprehensive metabolic panel    Standing Status: Standing     Number of Occurrences: 9     Standing Expiration Date: 01/25/2015   All questions were answered. The patient knows to call the clinic with any problems, questions  or concerns. No barriers to learning was detected. I spent 40 minutes counseling the patient face to face. The total time spent in the appointment was 55 minutes and more than 50% was on  counseling and review of test results     Heath Lark, MD 01/24/2014 3:21 PM

## 2014-01-25 ENCOUNTER — Ambulatory Visit: Payer: Medicare Other | Admitting: Hematology and Oncology

## 2014-01-25 ENCOUNTER — Encounter (HOSPITAL_COMMUNITY): Payer: Self-pay | Admitting: Pharmacy Technician

## 2014-01-25 ENCOUNTER — Other Ambulatory Visit: Payer: Medicare Other

## 2014-01-25 ENCOUNTER — Other Ambulatory Visit: Payer: Self-pay | Admitting: Radiology

## 2014-01-26 LAB — KAPPA/LAMBDA LIGHT CHAINS
Kappa free light chain: 2.21 mg/dL — ABNORMAL HIGH (ref 0.33–1.94)
Kappa:Lambda Ratio: 1.73 — ABNORMAL HIGH (ref 0.26–1.65)
LAMBDA FREE LGHT CHN: 1.28 mg/dL (ref 0.57–2.63)

## 2014-01-26 LAB — SPEP & IFE WITH QIG
ALPHA-2-GLOBULIN: 12.3 % — AB (ref 7.1–11.8)
Albumin ELP: 54.2 % — ABNORMAL LOW (ref 55.8–66.1)
Alpha-1-Globulin: 4.7 % (ref 2.9–4.9)
Beta 2: 4.4 % (ref 3.2–6.5)
Beta Globulin: 11.9 % — ABNORMAL HIGH (ref 4.7–7.2)
Gamma Globulin: 12.5 % (ref 11.1–18.8)
IGG (IMMUNOGLOBIN G), SERUM: 759 mg/dL (ref 690–1700)
IgA: 320 mg/dL (ref 69–380)
IgM, Serum: 9 mg/dL — ABNORMAL LOW (ref 52–322)
M-SPIKE, %: 0.45 g/dL
TOTAL PROTEIN, SERUM ELECTROPHOR: 6.2 g/dL (ref 6.0–8.3)

## 2014-01-29 ENCOUNTER — Telehealth: Payer: Self-pay | Admitting: *Deleted

## 2014-01-29 NOTE — Telephone Encounter (Signed)
Informed pt that correct dose of Revlmid is 5 mg.  She verbalized understanding and will call [facility] to deliver.

## 2014-01-29 NOTE — Telephone Encounter (Signed)
Plan to start her on 5 mg

## 2014-01-29 NOTE — Telephone Encounter (Signed)
Pt states new Rx for Revlimid is 5 mg and she thought Dr. Alvy Bimler told her she was going to increase her dose, (not decrease).  Pt states she has been taking 10 mg.  She just wants to be sure that 5 mg is correct before pharmacy delivers.

## 2014-01-30 ENCOUNTER — Ambulatory Visit (HOSPITAL_COMMUNITY)
Admission: RE | Admit: 2014-01-30 | Discharge: 2014-01-30 | Disposition: A | Discharge status: Home / Self Care | Payer: Medicare Other | Source: Ambulatory Visit | Attending: Hematology and Oncology | Admitting: Hematology and Oncology

## 2014-01-30 ENCOUNTER — Encounter (HOSPITAL_COMMUNITY): Payer: Self-pay

## 2014-01-30 ENCOUNTER — Other Ambulatory Visit: Payer: Self-pay | Admitting: Hematology and Oncology

## 2014-01-30 DIAGNOSIS — E785 Hyperlipidemia, unspecified: Secondary | ICD-10-CM | POA: Insufficient documentation

## 2014-01-30 DIAGNOSIS — C9000 Multiple myeloma not having achieved remission: Secondary | ICD-10-CM

## 2014-01-30 DIAGNOSIS — F3289 Other specified depressive episodes: Secondary | ICD-10-CM | POA: Insufficient documentation

## 2014-01-30 DIAGNOSIS — F329 Major depressive disorder, single episode, unspecified: Secondary | ICD-10-CM | POA: Insufficient documentation

## 2014-01-30 DIAGNOSIS — Z9221 Personal history of antineoplastic chemotherapy: Secondary | ICD-10-CM | POA: Insufficient documentation

## 2014-01-30 DIAGNOSIS — I10 Essential (primary) hypertension: Secondary | ICD-10-CM | POA: Insufficient documentation

## 2014-01-30 DIAGNOSIS — G4700 Insomnia, unspecified: Secondary | ICD-10-CM

## 2014-01-30 LAB — CBC WITH DIFFERENTIAL/PLATELET
Basophils Absolute: 0 10*3/uL (ref 0.0–0.1)
Basophils Relative: 1 % (ref 0–1)
EOS PCT: 16 % — AB (ref 0–5)
Eosinophils Absolute: 0.6 10*3/uL (ref 0.0–0.7)
HCT: 34.5 % — ABNORMAL LOW (ref 36.0–46.0)
HEMOGLOBIN: 11.7 g/dL — AB (ref 12.0–15.0)
LYMPHS ABS: 1.2 10*3/uL (ref 0.7–4.0)
Lymphocytes Relative: 37 % (ref 12–46)
MCH: 31.5 pg (ref 26.0–34.0)
MCHC: 33.9 g/dL (ref 30.0–36.0)
MCV: 92.7 fL (ref 78.0–100.0)
MONOS PCT: 14 % — AB (ref 3–12)
Monocytes Absolute: 0.5 10*3/uL (ref 0.1–1.0)
Neutro Abs: 1.1 10*3/uL — ABNORMAL LOW (ref 1.7–7.7)
Neutrophils Relative %: 32 % — ABNORMAL LOW (ref 43–77)
PLATELETS: 157 10*3/uL (ref 150–400)
RBC: 3.72 MIL/uL — AB (ref 3.87–5.11)
RDW: 13.7 % (ref 11.5–15.5)
WBC: 3.4 10*3/uL — AB (ref 4.0–10.5)

## 2014-01-30 LAB — APTT: aPTT: 26 seconds (ref 24–37)

## 2014-01-30 LAB — PROTIME-INR
INR: 1 (ref 0.00–1.49)
PROTHROMBIN TIME: 13 s (ref 11.6–15.2)

## 2014-01-30 MED ORDER — LIDOCAINE-EPINEPHRINE (PF) 2 %-1:200000 IJ SOLN
INTRAMUSCULAR | Status: AC
  Filled 2014-01-30: qty 20

## 2014-01-30 MED ORDER — CEFAZOLIN SODIUM-DEXTROSE 2-3 GM-% IV SOLR
2.0000 g | Freq: Once | INTRAVENOUS | Status: AC
  Administered 2014-01-30: 2 g via INTRAVENOUS
  Filled 2014-01-30: qty 50

## 2014-01-30 MED ORDER — MIDAZOLAM HCL 2 MG/2ML IJ SOLN
INTRAMUSCULAR | Status: AC
  Filled 2014-01-30: qty 6

## 2014-01-30 MED ORDER — HEPARIN SOD (PORK) LOCK FLUSH 100 UNIT/ML IV SOLN
INTRAVENOUS | Status: AC | PRN
  Administered 2014-01-30: 500 [IU]

## 2014-01-30 MED ORDER — LIDOCAINE HCL 1 % IJ SOLN
INTRAMUSCULAR | Status: AC
  Filled 2014-01-30: qty 20

## 2014-01-30 MED ORDER — HEPARIN SOD (PORK) LOCK FLUSH 100 UNIT/ML IV SOLN
INTRAVENOUS | Status: AC
  Filled 2014-01-30: qty 5

## 2014-01-30 MED ORDER — SODIUM CHLORIDE 0.9 % IV SOLN
INTRAVENOUS | Status: DC
  Administered 2014-01-30: 08:00:00 via INTRAVENOUS

## 2014-01-30 MED ORDER — FENTANYL CITRATE 0.05 MG/ML IJ SOLN
INTRAMUSCULAR | Status: AC | PRN
  Administered 2014-01-30 (×4): 25 ug via INTRAVENOUS

## 2014-01-30 MED ORDER — MIDAZOLAM HCL 2 MG/2ML IJ SOLN
INTRAMUSCULAR | Status: AC | PRN
  Administered 2014-01-30 (×4): 1 mg via INTRAVENOUS

## 2014-01-30 MED ORDER — FENTANYL CITRATE 0.05 MG/ML IJ SOLN
INTRAMUSCULAR | Status: AC
  Filled 2014-01-30: qty 6

## 2014-01-30 NOTE — Procedures (Signed)
Procedure:  Port placement Access:  Right IJ vein Findings:  SL Power Port placed with tip at [employer].  No PTX.  OK to use port.

## 2014-01-30 NOTE — H&P (Signed)
[name] is an 78 y.o. female.   Chief Complaint: "I'm here to get a port a cath" HPI: Patient with history of multiple myeloma/poor venous access presents today for port a cath placement.   Past Medical History  Diagnosis Date  . Anxiety   . Hypertension   . Hyperlipidemia   . Depression   . Osteopenia   . Multiple myeloma 03/2012  . Acute renal failure      due to hypercalcemia and suspected multiple myeloma resolved =with IVF's    . Lytic bone lesions on xray   . Acute subdural hematoma 03/2012     recent fall in hospital stay,resolved+  . Normocytic anemia     due to chemo and multiple myeloma,no active bleding  . Cancer     mul;tiple myeloma, bone lesions r 7th rib,   . Arthritis     spine  . Atrial fibrillation   . Radiation 05/09/12-05/18/12    20 gray in 8 Fx's right hip palliative  . Status post chemotherapy     velcade and revlimid  . Drug-induced osteonecrosis of jaw     Past Surgical History  Procedure Laterality Date  . Breast lumpectomy    . 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 Tonsillectomy      age 70    Family History  Problem Relation Age of Onset  . Heart disease Mother   . Hyperlipidemia Sister   . Hypertension Sister   . Diabetes Sister   . Diabetes Sister   . Hyperlipidemia Sister   . Hypertension Sister   . Sudden death Neg Hx   . Heart attack Neg Hx    Social History:  reports that she has never smoked. She has never used smokeless tobacco. She reports that she does not drink alcohol or use illicit drugs.  Allergies:  Allergies  Allergen Reactions  . Simvastatin Other (See Comments)    Unsure, Joint aches listed previously     Current outpatient prescriptions:acetaminophen (TYLENOL) 500 MG tablet, Take 500-1,000 mg by mouth every 4 (four) hours as needed (Pain). No more than 6 tabs in one day., Disp: , Rfl: ;  acyclovir (ZOVIRAX)  400 MG tablet, Take 1 tablet (400 mg total) by mouth daily., Disp: 30 tablet, Rfl: 6;  ALPRAZolam (XANAX) 1 MG tablet, Take 1 mg by mouth at bedtime as needed for sleep., Disp: , Rfl:  aspirin EC 81 MG tablet, Take 81 mg by mouth daily., Disp: , Rfl: ;  Calcium Carbonate-Vitamin D (CALCIUM 500 + D PO), Take 1 tablet by mouth 2 (two) times daily., Disp: , Rfl: ;  Cholecalciferol (VITAMIN D-3) 5000 UNITS TABS, Take 1 tablet by mouth daily. , Disp: , Rfl: ;  ibuprofen (ADVIL,MOTRIN) 200 MG tablet, Take 200 mg by mouth every 6 (six) hours as needed., Disp: , Rfl:  Multiple Vitamin (MULTIVITAMIN WITH MINERALS) TABS, Take 1 tablet by mouth daily., Disp: , Rfl: ;  potassium chloride SA (K-DUR,KLOR-CON) 20 MEQ tablet, Take 1 tablet (20 mEq total) by mouth daily., Disp: 60 tablet, Rfl: 1;  rosuvastatin (CRESTOR) 10 MG tablet, Take 10 mg by mouth daily., Disp: , Rfl: ;  traZODone (DESYREL) 50 MG tablet, Take 1 tablet (50 mg total) by mouth at bedtime as needed for sleep., Disp: 30 tablet, Rfl: 1 lenalidomide (REVLIMID) 10 MG capsule, Take 10 mg by mouth daily. Take for 21 days, then rest for 7  days, Disp: , Rfl: ;  lidocaine-prilocaine (EMLA) cream, Apply 1 application topically as needed., Disp: 30 g, Rfl: 0;  losartan (COZAAR) 50 MG tablet, Take 50 mg by mouth every morning., Disp: , Rfl: ;  ondansetron (ZOFRAN) 4 MG tablet, Take 4 mg by mouth every 4 (four) hours as needed for nausea. , Disp: , Rfl:  prochlorperazine (COMPAZINE) 10 MG tablet, Take 10 mg by mouth every 6 (six) hours as needed for nausea. , Disp: , Rfl: ;  traMADol (ULTRAM) 50 MG tablet, Take 1 tablet (50 mg total) by mouth every 8 (eight) hours as needed for pain., Disp: 60 tablet, Rfl: 3 Current facility-administered medications:0.9 %  sodium chloride infusion, , Intravenous, Continuous, Hedy Jacob, PA-C, Last Rate: 10 mL/hr at 01/30/14 0747;  ceFAZolin (ANCEF) IVPB 2 g/50 mL premix, 2 g, Intravenous, Once, Hedy Jacob, PA-C   Results for  orders placed during the hospital encounter of 01/30/14 (from the past 48 hour(s))  APTT     Status: None   Collection Time    01/30/14  7:40 AM      Result Value Ref Range   aPTT 26  24 - 37 seconds  CBC WITH DIFFERENTIAL     Status: Abnormal   Collection Time    01/30/14  7:40 AM      Result Value Ref Range   WBC 3.4 (*) 4.0 - 10.5 K/uL   RBC 3.72 (*) 3.87 - 5.11 MIL/uL   Hemoglobin 11.7 (*) 12.0 - 15.0 g/dL   HCT 34.5 (*) 36.0 - 46.0 %   MCV 92.7  78.0 - 100.0 fL   MCH 31.5  26.0 - 34.0 pg   MCHC 33.9  30.0 - 36.0 g/dL   RDW 13.7  11.5 - 15.5 %   Platelets 157  150 - 400 K/uL   Neutrophils Relative % 32 (*) 43 - 77 %   Neutro Abs 1.1 (*) 1.7 - 7.7 K/uL   Lymphocytes Relative 37  12 - 46 %   Lymphs Abs 1.2  0.7 - 4.0 K/uL   Monocytes Relative 14 (*) 3 - 12 %   Monocytes Absolute 0.5  0.1 - 1.0 K/uL   Eosinophils Relative 16 (*) 0 - 5 %   Eosinophils Absolute 0.6  0.0 - 0.7 K/uL   Basophils Relative 1  0 - 1 %   Basophils Absolute 0.0  0.0 - 0.1 K/uL  PROTIME-INR     Status: None   Collection Time    01/30/14  7:40 AM      Result Value Ref Range   Prothrombin Time 13.0  11.6 - 15.2 seconds   INR 1.00  0.00 - 1.49   No results found.  Review of Systems  Constitutional: Negative for fever and chills.  Respiratory: Negative for cough, hemoptysis and shortness of breath.   Cardiovascular:       Intermittent rt lower chest pain secondary to rib fx  Gastrointestinal: Negative for nausea and vomiting.       Occ abd pain  Genitourinary: Negative for hematuria.  Musculoskeletal: Positive for back pain.  Neurological: Negative for headaches.    Blood pressure 151/82, pulse 64, temperature 97.8 F (36.6 C), temperature source Oral, resp. rate 18, SpO2 100.00%. Physical Exam  Constitutional: She is oriented to person, place, and time. She appears well-developed and well-nourished.  Cardiovascular: Normal rate and regular rhythm.   Respiratory: Effort normal and breath  sounds normal.  GI: Soft. Bowel sounds are normal.  Musculoskeletal: Normal range of motion. She exhibits no edema.  Neurological: She is alert and oriented to person, place, and time.     Assessment/Plan Patient with history of multiple myeloma/poor venous access presents today for port a cath placement. Details/risks of procedure d/w pt/husband with their understanding and consent.   Crissie Sickles Allred 01/30/2014, 8:36 AM

## 2014-01-30 NOTE — H&P (Signed)
Agree.  Patient seen.  For port placement today. 

## 2014-01-30 NOTE — Discharge Instructions (Signed)
Implanted Port Home Guide °An implanted port is a type of central line that is placed under the skin. Central lines are used to provide IV access when treatment or nutrition needs to be given through a person's veins. Implanted ports are used for long-term IV access. An implanted port may be placed because:  °· You need IV medicine that would be irritating to the small veins in your hands or arms.   °· You need long-term IV medicines, such as antibiotics.   °· You need IV nutrition for a long period.   °· You need frequent blood draws for lab tests.   °· You need dialysis.   °Implanted ports are usually placed in the chest area, but they can also be placed in the upper arm, the abdomen, or the leg. An implanted port has two main parts:  °· Reservoir. The reservoir is round and will appear as a small, raised area under your skin. The reservoir is the part where a needle is inserted to give medicines or draw blood.   °· Catheter. The catheter is a thin, flexible tube that extends from the reservoir. The catheter is placed into a large vein. Medicine that is inserted into the reservoir goes into the catheter and then into the vein.   °HOW WILL I CARE FOR MY INCISION SITE? °Do not get the incision site wet. Bathe or shower as directed by your health care provider.  °HOW IS MY PORT ACCESSED? °Special steps must be taken to access the port:  °· Before the port is accessed, a numbing cream can be placed on the skin. This helps numb the skin over the port site.   °· Your health care provider uses a sterile technique to access the port. °· Your health care provider must put on a mask and sterile gloves. °· The skin over your port is cleaned carefully with an antiseptic and allowed to dry. °· The port is gently pinched between sterile gloves, and a needle is inserted into the port. °· Only "non-coring" port needles should be used to access the port. Once the port is accessed, a blood return should be checked. This helps  ensure that the port is in the vein and is not clogged.   °· If your port needs to remain accessed for a constant infusion, a clear (transparent) bandage will be placed over the needle site. The bandage and needle will need to be changed every week, or as directed by your health care provider.   °· Keep the bandage covering the needle clean and dry. Do not get it wet. Follow your health care provider's instructions on how to take a shower or bath while the port is accessed.   °· If your port does not need to stay accessed, no bandage is needed over the port.   °WHAT IS FLUSHING? °Flushing helps keep the port from getting clogged. Follow your health care provider's instructions on how and when to flush the port. Ports are usually flushed with saline solution or a medicine called heparin. The need for flushing will depend on how the port is used.  °· If the port is used for intermittent medicines or blood draws, the port will need to be flushed:   °· After medicines have been given.   °· After blood has been drawn.   °· As part of routine maintenance.   °· If a constant infusion is running, the port may not need to be flushed.   °HOW LONG WILL MY PORT STAY IMPLANTED? °The port can stay in for as long as your health care   provider thinks it is needed. When it is time for the port to come out, surgery will be done to remove it. The procedure is similar to the one performed when the port was put in.  °WHEN SHOULD I SEEK IMMEDIATE MEDICAL CARE? °When you have an implanted port, you should seek immediate medical care if:  °· You notice a bad smell coming from the incision site.   °· You have swelling, redness, or drainage at the incision site.   °· You have more swelling or pain at the port site or the surrounding area.   °· You have a fever that is not controlled with medicine. °Document Released: 09/14/2005 Document Revised: 07/05/2013 Document Reviewed: 05/22/2013 °[employer]® Patient Information ©2014 [employer]. °Moderate Sedation, Adult °Moderate sedation is given to help you relax or even sleep through a procedure. You may remain sleepy, be clumsy, or have poor balance for several hours following this procedure. Arrange for a responsible adult, family member, or friend to take you home. A responsible adult should stay with you for at least 24 hours or until the medicines have worn off. °· Do not participate in any activities where you could become injured for the next 24 hours, or until you feel normal again. Do not: °· Drive. °· Swim. °· Ride a bicycle. °· Operate heavy machinery. °· Cook. °· Use power tools. °· Climb ladders. °· Work at heights. °· Do not make important decisions or sign legal documents until you are improved. °· Vomiting may occur if you eat too soon. When you can drink without vomiting, try water, juice, or soup. Try solid foods if you feel little or no nausea. °· Only take over-the-counter or prescription medications for pain, discomfort, or fever as directed by your caregiver.If pain medications have been prescribed for you, ask your caregiver how soon it is safe to take them. °· Make sure you and your family fully understands everything about the medication given to you. Make sure you understand what side effects may occur. °· You should not drink alcohol, take sleeping pills, or medications that cause drowsiness for at least 24 hours. °· If you smoke, do not smoke alone. °· If you are feeling better, you may resume normal activities 24 hours after receiving sedation. °· Keep all appointments as scheduled. Follow all instructions. °· Ask questions if you do not understand. °SEEK MEDICAL CARE IF:  °· Your skin is pale or bluish in color. °· You continue to feel sick to your stomach (nauseous) or throw up (vomit). °· Your pain is getting worse and not helped by medication. °· You have bleeding or swelling. °· You are still sleepy or feeling clumsy after 24 hours. °SEEK IMMEDIATE MEDICAL CARE IF:   °· You develop a rash. °· You have difficulty breathing. °· You develop any type of allergic problem. °· You have a fever. °Document Released: 06/09/2001 Document Revised: 12/07/2011 Document Reviewed: 05/22/2013 °[employer]® Patient Information ©2014 [employer]. ° °

## 2014-01-31 NOTE — Telephone Encounter (Signed)
[facility] sent facsimile confirmation of Revlimid prescription shipment.  Revlimid was shipped on 01-30-2014 with next business day delivery.

## 2014-02-01 ENCOUNTER — Telehealth: Payer: Self-pay | Admitting: Hematology and Oncology

## 2014-02-01 ENCOUNTER — Other Ambulatory Visit (HOSPITAL_BASED_OUTPATIENT_CLINIC_OR_DEPARTMENT_OTHER): Payer: Medicare Other

## 2014-02-01 ENCOUNTER — Encounter: Payer: Self-pay | Admitting: Hematology and Oncology

## 2014-02-01 ENCOUNTER — Ambulatory Visit (HOSPITAL_BASED_OUTPATIENT_CLINIC_OR_DEPARTMENT_OTHER): Payer: Medicare Other

## 2014-02-01 ENCOUNTER — Other Ambulatory Visit: Payer: Medicare Other | Admitting: Lab

## 2014-02-01 ENCOUNTER — Ambulatory Visit (HOSPITAL_BASED_OUTPATIENT_CLINIC_OR_DEPARTMENT_OTHER): Payer: Medicare Other | Admitting: Hematology and Oncology

## 2014-02-01 VITALS — BP 152/81 | HR 77 | Temp 98.4°F | Resp 20 | Ht 65.0 in | Wt 152.0 lb

## 2014-02-01 DIAGNOSIS — G4700 Insomnia, unspecified: Secondary | ICD-10-CM

## 2014-02-01 DIAGNOSIS — C9000 Multiple myeloma not having achieved remission: Secondary | ICD-10-CM

## 2014-02-01 DIAGNOSIS — F411 Generalized anxiety disorder: Secondary | ICD-10-CM

## 2014-02-01 DIAGNOSIS — D72819 Decreased white blood cell count, unspecified: Secondary | ICD-10-CM

## 2014-02-01 DIAGNOSIS — M8708 Idiopathic aseptic necrosis of bone, other site: Secondary | ICD-10-CM

## 2014-02-01 DIAGNOSIS — M549 Dorsalgia, unspecified: Secondary | ICD-10-CM

## 2014-02-01 DIAGNOSIS — R079 Chest pain, unspecified: Secondary | ICD-10-CM

## 2014-02-01 DIAGNOSIS — Z5112 Encounter for antineoplastic immunotherapy: Secondary | ICD-10-CM

## 2014-02-01 LAB — COMPREHENSIVE METABOLIC PANEL (CC13)
ALT: 9 U/L (ref 0–55)
AST: 16 U/L (ref 5–34)
Albumin: 3.1 g/dL — ABNORMAL LOW (ref 3.5–5.0)
Alkaline Phosphatase: 49 U/L (ref 40–150)
Anion Gap: 12 mEq/L — ABNORMAL HIGH (ref 3–11)
BUN: 16.1 mg/dL (ref 7.0–26.0)
CHLORIDE: 107 meq/L (ref 98–109)
CO2: 24 meq/L (ref 22–29)
Calcium: 9.9 mg/dL (ref 8.4–10.4)
Creatinine: 1.1 mg/dL (ref 0.6–1.1)
Glucose: 93 mg/dl (ref 70–140)
POTASSIUM: 4 meq/L (ref 3.5–5.1)
Sodium: 143 mEq/L (ref 136–145)
Total Bilirubin: 0.74 mg/dL (ref 0.20–1.20)
Total Protein: 6.3 g/dL — ABNORMAL LOW (ref 6.4–8.3)

## 2014-02-01 LAB — CBC WITH DIFFERENTIAL/PLATELET
BASO%: 1.5 % (ref 0.0–2.0)
Basophils Absolute: 0.1 10*3/uL (ref 0.0–0.1)
EOS%: 8.7 % — ABNORMAL HIGH (ref 0.0–7.0)
Eosinophils Absolute: 0.3 10*3/uL (ref 0.0–0.5)
HCT: 34.6 % — ABNORMAL LOW (ref 34.8–46.6)
HGB: 11.7 g/dL (ref 11.6–15.9)
LYMPH%: 34.3 % (ref 14.0–49.7)
MCH: 31.4 pg (ref 25.1–34.0)
MCHC: 33.8 g/dL (ref 31.5–36.0)
MCV: 93 fL (ref 79.5–101.0)
MONO#: 0.4 10*3/uL (ref 0.1–0.9)
MONO%: 10.2 % (ref 0.0–14.0)
NEUT%: 45.3 % (ref 38.4–76.8)
NEUTROS ABS: 1.7 10*3/uL (ref 1.5–6.5)
Platelets: 149 10*3/uL (ref 145–400)
RBC: 3.72 10*6/uL (ref 3.70–5.45)
RDW: 14.8 % — AB (ref 11.2–14.5)
WBC: 3.8 10*3/uL — ABNORMAL LOW (ref 3.9–10.3)
lymph#: 1.3 10*3/uL (ref 0.9–3.3)

## 2014-02-01 MED ORDER — ONDANSETRON 8 MG/50ML IVPB (CHCC)
8.0000 mg | Freq: Once | INTRAVENOUS | Status: AC
  Administered 2014-02-01: 8 mg via INTRAVENOUS

## 2014-02-01 MED ORDER — ONDANSETRON HCL 8 MG PO TABS: 8.0000 mg | ORAL_TABLET | Freq: Three times a day (TID) | ORAL | Status: DC | PRN

## 2014-02-01 MED ORDER — ONDANSETRON 8 MG/NS 50 ML IVPB
INTRAVENOUS | Status: AC
  Filled 2014-02-01: qty 8

## 2014-02-01 MED ORDER — BORTEZOMIB CHEMO IV INJECTION 3.5 MG
1.3000 mg/m2 | Freq: Once | INTRAMUSCULAR | Status: AC
  Administered 2014-02-01: 2.3 mg via INTRAVENOUS
  Filled 2014-02-01: qty 2.3

## 2014-02-01 MED ORDER — HEPARIN SOD (PORK) LOCK FLUSH 100 UNIT/ML IV SOLN
500.0000 [IU] | Freq: Once | INTRAVENOUS | Status: AC | PRN
  Administered 2014-02-01: 500 [IU]
  Filled 2014-02-01: qty 5

## 2014-02-01 MED ORDER — ONDANSETRON HCL 8 MG PO TABS
ORAL_TABLET | ORAL | Status: AC
  Filled 2014-02-01: qty 1

## 2014-02-01 MED ORDER — DEXAMETHASONE 4 MG PO TABS
ORAL_TABLET | ORAL | Status: AC
  Filled 2014-02-01: qty 5

## 2014-02-01 MED ORDER — DEXAMETHASONE 4 MG PO TABS
20.0000 mg | ORAL_TABLET | ORAL | Status: DC
  Administered 2014-02-01: 20 mg via ORAL

## 2014-02-01 MED ORDER — PROCHLORPERAZINE MALEATE 10 MG PO TABS: 10.0000 mg | ORAL_TABLET | Freq: Four times a day (QID) | ORAL | Status: DC | PRN

## 2014-02-01 MED ORDER — SODIUM CHLORIDE 0.9 % IJ SOLN
10.0000 mL | INTRAMUSCULAR | Status: DC | PRN
  Administered 2014-02-01: 10 mL
  Filled 2014-02-01: qty 10

## 2014-02-01 MED ORDER — SODIUM CHLORIDE 0.9 % IV SOLN
Freq: Once | INTRAVENOUS | Status: AC
  Administered 2014-02-01: 11:00:00 via INTRAVENOUS

## 2014-02-01 NOTE — Progress Notes (Signed)
Clarify: Velcade to be given IV

## 2014-02-01 NOTE — Telephone Encounter (Signed)
gv adn printed appt sched and avs for opt for May.... °

## 2014-02-01 NOTE — Progress Notes (Signed)
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    04/08/2012 Bone Marrow Biopsy BM biopsy showed 92% plasma cell, loss chromosome 8 and 13, addition of chromosome 8p, 14q and Myeloma FISH showed 13q and extra chromosome 14   04/12/2012 - 10/20/2012 Chemotherapy Velcade 3 weeks on 1 week off, Revlimid 15 mg PO days 1-21 days, dexamethasone 40 mg weekly.    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   01/30/2014 Procedure She has placement of Infuse-a-Port   02/01/2014 -  Chemotherapy Due to progression of disease, Velcade was added back. Revlimid doses reduced to 5 mg, 21 days on 7 days off    INTERVAL HISTORY: [name] 78 y.o. female returns for further followup. She has significant anxiety and difficulties with sleeping. Trazodone did not help. She still had intermittent rib pain but it is slowly getting better. She had placement of Infuse-a-Port with some bruising on the chest wall. She denies any new  dental issues.  I have reviewed the past medical history, past surgical history, social history and family history with the patient and they are unchanged from previous note.  ALLERGIES:  is allergic to simvastatin.  MEDICATIONS:  Current Outpatient Prescriptions  Medication Sig Dispense Refill  . acetaminophen (TYLENOL) 500 MG tablet Take 500-1,000 mg by mouth every 4 (four) hours as needed (Pain). No more than 6 tabs in one day.      Marland Kitchen acyclovir (ZOVIRAX) 400 MG tablet Take 1 tablet (400 mg total) by mouth daily.  30 tablet  6  . ALPRAZolam (XANAX) 1 MG tablet Take 1 mg by mouth at bedtime as needed for sleep.      Marland Kitchen aspirin EC 81 MG tablet Take 81 mg by mouth daily.      . Calcium Carbonate-Vitamin D (CALCIUM 500 + D PO) Take 1 tablet by mouth 2 (two) times daily.      . Cholecalciferol (VITAMIN D-3) 5000 UNITS TABS Take 1 tablet by mouth daily.       Marland Kitchen ibuprofen (ADVIL,MOTRIN) 200 MG tablet Take 200 mg by mouth every 6 (six) hours as needed.      Marland Kitchen lenalidomide (REVLIMID) 10 MG capsule Take 10 mg by mouth daily. Take for 21 days, then rest for 7 days      . lidocaine-prilocaine (EMLA) cream Apply 1 application topically as needed.  30 g  0  . losartan (COZAAR) 50 MG tablet Take 50 mg by mouth every morning.      . Multiple Vitamin (MULTIVITAMIN  WITH MINERALS) TABS Take 1 tablet by mouth daily.      . ondansetron (ZOFRAN) 4 MG tablet Take 4 mg by mouth every 4 (four) hours as needed for nausea.       . potassium chloride SA (K-DUR,KLOR-CON) 20 MEQ tablet Take 1 tablet (20 mEq total) by mouth daily.  60 tablet  1  . prochlorperazine (COMPAZINE) 10 MG tablet Take 10 mg by mouth every 6 (six) hours as needed for nausea.       . rosuvastatin (CRESTOR) 10 MG tablet Take 10 mg by mouth daily.      . traMADol (ULTRAM) 50 MG tablet Take 1 tablet (50 mg total) by mouth every 8 (eight) hours as needed for pain.  60 tablet  3  . traZODone (DESYREL) 50 MG tablet Take 1 tablet (50 mg total) by mouth  at bedtime as needed for sleep.  30 tablet  1  . ondansetron (ZOFRAN) 8 MG tablet Take 1 tablet (8 mg total) by mouth every 8 (eight) hours as needed for nausea or vomiting.  30 tablet  1  . prochlorperazine (COMPAZINE) 10 MG tablet Take 1 tablet (10 mg total) by mouth every 6 (six) hours as needed (Nausea or vomiting).  30 tablet  1   No current facility-administered medications for this visit.    REVIEW OF SYSTEMS:   Constitutional: Denies fevers, chills or abnormal weight loss Eyes: Denies blurriness of vision Ears, nose, mouth, throat, and face: Denies mucositis or sore throat Respiratory: Denies cough, dyspnea or wheezes Cardiovascular: Denies palpitation, chest discomfort or lower extremity swelling Gastrointestinal:  Denies nausea, heartburn or change in bowel habits Skin: Denies abnormal skin rashes Lymphatics: Denies new lymphadenopathy or easy bruising Neurological:Denies numbness, tingling or new weaknesses Behavioral/Psych: Mood is stable, no new changes  All other systems were reviewed with the patient and are negative.  PHYSICAL EXAMINATION: ECOG PERFORMANCE STATUS: 1 - Symptomatic but completely ambulatory  Filed Vitals:   02/01/14 0853  BP: 152/81  Pulse: 77  Temp: 98.4 F (36.9 C)  Resp: 20   Filed Weights   02/01/14 0853  Weight: 152 lb (68.947 kg)    GENERAL:alert, no distress and comfortable SKIN: Extensive bruising noted around the chest wall from recent placement of port. EYES: normal, Conjunctiva are pink and non-injected, sclera clear Musculoskeletal:no cyanosis of digits and no clubbing  NEURO: alert & oriented x 3 with fluent speech, no focal motor/sensory deficits  LABORATORY DATA:  I have reviewed the data as listed    Component Value Date/Time   NA 143 02/01/2014 0843   NA 139 07/10/2013 1001   K 4.0 02/01/2014 0843   K 3.9 07/10/2013 1001   CL 106 07/10/2013 1001   CL 105 02/23/2013 1334   CO2 24 02/01/2014 0843   CO2 27 07/10/2013 1001    GLUCOSE 93 02/01/2014 0843   GLUCOSE 85 07/10/2013 1001   GLUCOSE 83 02/23/2013 1334   GLUCOSE 96 09/30/2006 1322   BUN 16.1 02/01/2014 0843   BUN 18 07/10/2013 1001   CREATININE 1.1 02/01/2014 0843   CREATININE 1.16* 07/10/2013 1001   CREATININE 1.17* 03/23/2013 1155   CALCIUM 9.9 02/01/2014 0843   CALCIUM 8.8 07/10/2013 1001   CALCIUM 11.8* 04/04/2012 0420   PROT 6.3* 02/01/2014 0843   PROT 6.3 11/25/2012 1050   ALBUMIN 3.1* 02/01/2014 0843   ALBUMIN 4.2 11/25/2012 1050   AST 16 02/01/2014 0843   AST 13 11/25/2012 1050   ALT 9 02/01/2014 0843  ALT 16 11/25/2012 1050   ALKPHOS 49 02/01/2014 0843   ALKPHOS 38* 11/25/2012 1050   BILITOT 0.74 02/01/2014 0843   BILITOT 1.0 11/25/2012 1050   GFRNONAA 46* 07/10/2013 1001   GFRNONAA 51* 11/27/2012 1237   GFRAA 53* 07/10/2013 1001   GFRAA 59* 11/27/2012 1237    No results found for this basename: SPEP,  UPEP,   kappa and lambda light chains    Lab Results  Component Value Date   WBC 3.8* 02/01/2014   NEUTROABS 1.7 02/01/2014   HGB 11.7 02/01/2014   HCT 34.6* 02/01/2014   MCV 93.0 02/01/2014   PLT 149 02/01/2014      Chemistry      Component Value Date/Time   NA 143 02/01/2014 0843   NA 139 07/10/2013 1001   K 4.0 02/01/2014 0843   K 3.9 07/10/2013 1001   CL 106 07/10/2013 1001   CL 105 02/23/2013 1334   CO2 24 02/01/2014 0843   CO2 27 07/10/2013 1001   BUN 16.1 02/01/2014 0843   BUN 18 07/10/2013 1001   CREATININE 1.1 02/01/2014 0843   CREATININE 1.16* 07/10/2013 1001   CREATININE 1.17* 03/23/2013 1155      Component Value Date/Time   CALCIUM 9.9 02/01/2014 0843   CALCIUM 8.8 07/10/2013 1001   CALCIUM 11.8* 04/04/2012 0420   ALKPHOS 49 02/01/2014 0843   ALKPHOS 38* 11/25/2012 1050   AST 16 02/01/2014 0843   AST 13 11/25/2012 1050   ALT 9 02/01/2014 0843   ALT 16 11/25/2012 1050   BILITOT 0.74 02/01/2014 0843   BILITOT 1.0 11/25/2012 1050     ASSESSMENT & PLAN:  #1 multiple myeloma I have discontinued her Zometa recently due to osteonecrosis of the jaw.  Her most recent  blood work and urine tests confirmed she is in very good partial response but I am very concerned because her M spike went from undetectable to abnormally high and it continued to rise over the past few months. I recommend adding back Velcade weekly subcutaneous along with Revlimid. She is becoming leukopenic and I plan to reduce Revlimid to 5 mg, to be taken 21 days on 5 days off.  I plan to resume dexamethasone once we restart her back on Velcade.  We discussed the role of chemotherapy. The intent is for palliative.  We discussed some of the risks, benefits, side-effects of Revlimid, Bortezemib and Dexamethasone.   Some of the short term side-effects included, though not limited to, risk of fatigue, risk of allergic reactions, mouth sores, weight loss, pancytopenia, life-threatening infections, need for transfusions of blood products, nausea, vomiting, change in bowel habits, blood clots, admission to hospital for various reasons, and risks of death.   Long term side-effects are also discussed including risks of infertility, permanent damage to nerve function, chronic fatigue, and rare secondary malignancy including bone marrow disorders and leukemia.   The patient is aware that the response rates discussed earlier is not guaranteed.  After a long discussion, patient made an informed decision to proceed with the prescribed plan of care. She will come in weekly with blood draw and chemotherapy. I will see her back in 3 weeks to assess for toxicity.  #2 osteonecrosis of the jaw I have discontinued her Zometa. She will continue oral care with a dentist. I will consider adding back the Zometa after her next appointment with a dentist which would be in July. #3 Rib pain and back pain Continue to recommend she take calcium and vitamin  D supplement. I suspect this may be due to disease. #4 DVT prophylaxis She will continue on aspirin. #5 antimicrobial prophylaxis I have restarted back acyclovir   #6 mild leukopenia  I suspect this is due to disease. Recommend observation for now #7 anxiety and insomnia I will continue her on trazodone daily.     All questions were answered. The patient knows to call the clinic with any problems, questions or concerns. No barriers to learning was detected.    Heath Lark, MD 02/01/2014 9:48 AM

## 2014-02-01 NOTE — Patient Instructions (Signed)
[facility] Discharge Instructions for Patients Receiving Chemotherapy  Today you received the following chemotherapy agents velcade   To help prevent nausea and vomiting after your treatment, we encourage you to take your nausea medication as directed  If you develop nausea and vomiting that is not controlled by your nausea medication, call the clinic.   BELOW ARE SYMPTOMS THAT SHOULD BE REPORTED IMMEDIATELY:  *FEVER GREATER THAN 100.5 F  *CHILLS WITH OR WITHOUT FEVER  NAUSEA AND VOMITING THAT IS NOT CONTROLLED WITH YOUR NAUSEA MEDICATION  *UNUSUAL SHORTNESS OF BREATH  *UNUSUAL BRUISING OR BLEEDING  TENDERNESS IN MOUTH AND THROAT WITH OR WITHOUT PRESENCE OF ULCERS  *URINARY PROBLEMS  *BOWEL PROBLEMS  UNUSUAL RASH Items with * indicate a potential emergency and should be followed up as soon as possible.  Feel free to call the clinic you have any questions or concerns. The clinic phone number is [phone].  

## 2014-02-02 ENCOUNTER — Other Ambulatory Visit: Payer: Self-pay | Admitting: Family Medicine

## 2014-02-08 ENCOUNTER — Ambulatory Visit (HOSPITAL_BASED_OUTPATIENT_CLINIC_OR_DEPARTMENT_OTHER): Payer: Medicare Other

## 2014-02-08 ENCOUNTER — Other Ambulatory Visit (HOSPITAL_BASED_OUTPATIENT_CLINIC_OR_DEPARTMENT_OTHER): Payer: Medicare Other

## 2014-02-08 ENCOUNTER — Other Ambulatory Visit: Payer: Self-pay | Admitting: Hematology and Oncology

## 2014-02-08 VITALS — BP 134/80 | HR 74 | Temp 97.8°F | Resp 20

## 2014-02-08 DIAGNOSIS — C9000 Multiple myeloma not having achieved remission: Secondary | ICD-10-CM

## 2014-02-08 DIAGNOSIS — G4700 Insomnia, unspecified: Secondary | ICD-10-CM

## 2014-02-08 DIAGNOSIS — D72819 Decreased white blood cell count, unspecified: Secondary | ICD-10-CM

## 2014-02-08 DIAGNOSIS — Z5112 Encounter for antineoplastic immunotherapy: Secondary | ICD-10-CM

## 2014-02-08 LAB — CBC WITH DIFFERENTIAL/PLATELET
BASO%: 1.4 % (ref 0.0–2.0)
BASOS ABS: 0.1 10*3/uL (ref 0.0–0.1)
EOS ABS: 0.4 10*3/uL (ref 0.0–0.5)
EOS%: 10.3 % — ABNORMAL HIGH (ref 0.0–7.0)
HCT: 33.6 % — ABNORMAL LOW (ref 34.8–46.6)
HEMOGLOBIN: 11.1 g/dL — AB (ref 11.6–15.9)
LYMPH#: 1.1 10*3/uL (ref 0.9–3.3)
LYMPH%: 30.6 % (ref 14.0–49.7)
MCH: 31.1 pg (ref 25.1–34.0)
MCHC: 33.1 g/dL (ref 31.5–36.0)
MCV: 93.9 fL (ref 79.5–101.0)
MONO#: 0.5 10*3/uL (ref 0.1–0.9)
MONO%: 13.7 % (ref 0.0–14.0)
NEUT%: 44 % (ref 38.4–76.8)
NEUTROS ABS: 1.6 10*3/uL (ref 1.5–6.5)
Platelets: 123 10*3/uL — ABNORMAL LOW (ref 145–400)
RBC: 3.57 10*6/uL — AB (ref 3.70–5.45)
RDW: 15 % — AB (ref 11.2–14.5)
WBC: 3.6 10*3/uL — ABNORMAL LOW (ref 3.9–10.3)

## 2014-02-08 LAB — COMPREHENSIVE METABOLIC PANEL (CC13)
ALT: 16 U/L (ref 0–55)
AST: 16 U/L (ref 5–34)
Albumin: 3.1 g/dL — ABNORMAL LOW (ref 3.5–5.0)
Alkaline Phosphatase: 47 U/L (ref 40–150)
Anion Gap: 11 mEq/L (ref 3–11)
BUN: 18.4 mg/dL (ref 7.0–26.0)
CALCIUM: 9.6 mg/dL (ref 8.4–10.4)
CHLORIDE: 108 meq/L (ref 98–109)
CO2: 23 mEq/L (ref 22–29)
Creatinine: 1 mg/dL (ref 0.6–1.1)
Glucose: 96 mg/dl (ref 70–140)
Potassium: 4.1 mEq/L (ref 3.5–5.1)
SODIUM: 143 meq/L (ref 136–145)
TOTAL PROTEIN: 6 g/dL — AB (ref 6.4–8.3)
Total Bilirubin: 0.45 mg/dL (ref 0.20–1.20)

## 2014-02-08 MED ORDER — DEXAMETHASONE 4 MG PO TABS
ORAL_TABLET | ORAL | Status: AC
  Filled 2014-02-08: qty 10

## 2014-02-08 MED ORDER — ONDANSETRON 8 MG/50ML IVPB (CHCC)
8.0000 mg | Freq: Once | INTRAVENOUS | Status: AC
  Administered 2014-02-08: 8 mg via INTRAVENOUS

## 2014-02-08 MED ORDER — DEXAMETHASONE SODIUM PHOSPHATE 20 MG/5ML IJ SOLN
INTRAMUSCULAR | Status: AC
  Filled 2014-02-08: qty 5

## 2014-02-08 MED ORDER — SODIUM CHLORIDE 0.9 % IV SOLN
Freq: Once | INTRAVENOUS | Status: AC
  Administered 2014-02-08: 14:00:00 via INTRAVENOUS

## 2014-02-08 MED ORDER — DEXAMETHASONE 4 MG PO TABS
20.0000 mg | ORAL_TABLET | ORAL | Status: DC
  Administered 2014-02-08: 20 mg via ORAL

## 2014-02-08 MED ORDER — ONDANSETRON 8 MG/NS 50 ML IVPB
INTRAVENOUS | Status: AC
  Filled 2014-02-08: qty 8

## 2014-02-08 MED ORDER — HEPARIN SOD (PORK) LOCK FLUSH 100 UNIT/ML IV SOLN
500.0000 [IU] | Freq: Once | INTRAVENOUS | Status: DC | PRN
  Filled 2014-02-08: qty 5

## 2014-02-08 MED ORDER — SODIUM CHLORIDE 0.9 % IJ SOLN
10.0000 mL | INTRAMUSCULAR | Status: DC | PRN
  Filled 2014-02-08: qty 10

## 2014-02-08 MED ORDER — BORTEZOMIB CHEMO IV INJECTION 3.5 MG
1.3000 mg/m2 | Freq: Once | INTRAMUSCULAR | Status: AC
  Administered 2014-02-08: 2.3 mg via INTRAVENOUS
  Filled 2014-02-08: qty 2.3

## 2014-02-15 ENCOUNTER — Other Ambulatory Visit: Payer: Self-pay | Admitting: Hematology and Oncology

## 2014-02-15 ENCOUNTER — Other Ambulatory Visit (HOSPITAL_BASED_OUTPATIENT_CLINIC_OR_DEPARTMENT_OTHER): Payer: Medicare Other

## 2014-02-15 ENCOUNTER — Ambulatory Visit (HOSPITAL_BASED_OUTPATIENT_CLINIC_OR_DEPARTMENT_OTHER): Payer: Medicare Other

## 2014-02-15 VITALS — BP 153/79 | HR 70 | Temp 97.0°F

## 2014-02-15 DIAGNOSIS — C9000 Multiple myeloma not having achieved remission: Secondary | ICD-10-CM

## 2014-02-15 DIAGNOSIS — Z5112 Encounter for antineoplastic immunotherapy: Secondary | ICD-10-CM

## 2014-02-15 DIAGNOSIS — G4700 Insomnia, unspecified: Secondary | ICD-10-CM

## 2014-02-15 LAB — CBC WITH DIFFERENTIAL/PLATELET
BASO%: 1.1 % (ref 0.0–2.0)
Basophils Absolute: 0 10*3/uL (ref 0.0–0.1)
EOS%: 8.2 % — ABNORMAL HIGH (ref 0.0–7.0)
Eosinophils Absolute: 0.4 10*3/uL (ref 0.0–0.5)
HEMATOCRIT: 34.4 % — AB (ref 34.8–46.6)
HGB: 11.4 g/dL — ABNORMAL LOW (ref 11.6–15.9)
LYMPH%: 25.2 % (ref 14.0–49.7)
MCH: 31.1 pg (ref 25.1–34.0)
MCHC: 33.1 g/dL (ref 31.5–36.0)
MCV: 93.9 fL (ref 79.5–101.0)
MONO#: 0.5 10*3/uL (ref 0.1–0.9)
MONO%: 11.3 % (ref 0.0–14.0)
NEUT%: 54.2 % (ref 38.4–76.8)
NEUTROS ABS: 2.4 10*3/uL (ref 1.5–6.5)
PLATELETS: 122 10*3/uL — AB (ref 145–400)
RBC: 3.66 10*6/uL — AB (ref 3.70–5.45)
RDW: 14.8 % — ABNORMAL HIGH (ref 11.2–14.5)
WBC: 4.5 10*3/uL (ref 3.9–10.3)
lymph#: 1.1 10*3/uL (ref 0.9–3.3)

## 2014-02-15 LAB — COMPREHENSIVE METABOLIC PANEL (CC13)
ALK PHOS: 47 U/L (ref 40–150)
ALT: 22 U/L (ref 0–55)
ANION GAP: 11 meq/L (ref 3–11)
AST: 21 U/L (ref 5–34)
Albumin: 3.2 g/dL — ABNORMAL LOW (ref 3.5–5.0)
BUN: 15.3 mg/dL (ref 7.0–26.0)
CO2: 24 mEq/L (ref 22–29)
CREATININE: 1.3 mg/dL — AB (ref 0.6–1.1)
Calcium: 9.1 mg/dL (ref 8.4–10.4)
Chloride: 106 mEq/L (ref 98–109)
Glucose: 91 mg/dl (ref 70–140)
Potassium: 4.2 mEq/L (ref 3.5–5.1)
Sodium: 141 mEq/L (ref 136–145)
Total Bilirubin: 0.46 mg/dL (ref 0.20–1.20)
Total Protein: 6 g/dL — ABNORMAL LOW (ref 6.4–8.3)

## 2014-02-15 MED ORDER — BORTEZOMIB CHEMO IV INJECTION 3.5 MG
1.3000 mg/m2 | Freq: Once | INTRAMUSCULAR | Status: AC
  Administered 2014-02-15: 2.3 mg via INTRAVENOUS
  Filled 2014-02-15: qty 2.3

## 2014-02-15 MED ORDER — DEXAMETHASONE 4 MG PO TABS
20.0000 mg | ORAL_TABLET | ORAL | Status: DC
  Administered 2014-02-15 (×2): 20 mg via ORAL

## 2014-02-15 MED ORDER — SODIUM CHLORIDE 0.9 % IV SOLN
Freq: Once | INTRAVENOUS | Status: AC
  Administered 2014-02-15: 14:00:00 via INTRAVENOUS

## 2014-02-15 MED ORDER — HEPARIN SOD (PORK) LOCK FLUSH 100 UNIT/ML IV SOLN
500.0000 [IU] | Freq: Once | INTRAVENOUS | Status: AC | PRN
  Administered 2014-02-15: 500 [IU]
  Filled 2014-02-15: qty 5

## 2014-02-15 MED ORDER — SODIUM CHLORIDE 0.9 % IJ SOLN
10.0000 mL | INTRAMUSCULAR | Status: DC | PRN
  Administered 2014-02-15: 10 mL
  Filled 2014-02-15: qty 10

## 2014-02-15 MED ORDER — ONDANSETRON 8 MG/50ML IVPB (CHCC)
8.0000 mg | Freq: Once | INTRAVENOUS | Status: AC
  Administered 2014-02-15: 8 mg via INTRAVENOUS

## 2014-02-15 MED ORDER — ONDANSETRON 8 MG/NS 50 ML IVPB
INTRAVENOUS | Status: AC
  Filled 2014-02-15: qty 8

## 2014-02-15 MED ORDER — DEXAMETHASONE 4 MG PO TABS
ORAL_TABLET | ORAL | Status: AC
  Filled 2014-02-15: qty 5

## 2014-02-15 NOTE — Patient Instructions (Signed)
[facility] Discharge Instructions for Patients Receiving Chemotherapy  Today you received the following chemotherapy agents: Velcade.  To help prevent nausea and vomiting after your treatment, we encourage you to take your nausea medication as prescribed.   If you develop nausea and vomiting that is not controlled by your nausea medication, call the clinic.   BELOW ARE SYMPTOMS THAT SHOULD BE REPORTED IMMEDIATELY:  *FEVER GREATER THAN 100.5 F  *CHILLS WITH OR WITHOUT FEVER  NAUSEA AND VOMITING THAT IS NOT CONTROLLED WITH YOUR NAUSEA MEDICATION  *UNUSUAL SHORTNESS OF BREATH  *UNUSUAL BRUISING OR BLEEDING  TENDERNESS IN MOUTH AND THROAT WITH OR WITHOUT PRESENCE OF ULCERS  *URINARY PROBLEMS  *BOWEL PROBLEMS  UNUSUAL RASH Items with * indicate a potential emergency and should be followed up as soon as possible.  Feel free to call the clinic you have any questions or concerns. The clinic phone number is [phone].    

## 2014-02-21 ENCOUNTER — Other Ambulatory Visit: Payer: Self-pay | Admitting: Family Medicine

## 2014-02-22 ENCOUNTER — Ambulatory Visit (HOSPITAL_BASED_OUTPATIENT_CLINIC_OR_DEPARTMENT_OTHER): Payer: Medicare Other | Admitting: Hematology and Oncology

## 2014-02-22 ENCOUNTER — Ambulatory Visit: Payer: Medicare Other | Admitting: Internal Medicine

## 2014-02-22 ENCOUNTER — Other Ambulatory Visit: Payer: Self-pay | Admitting: *Deleted

## 2014-02-22 ENCOUNTER — Encounter: Payer: Self-pay | Admitting: Hematology and Oncology

## 2014-02-22 ENCOUNTER — Other Ambulatory Visit (HOSPITAL_BASED_OUTPATIENT_CLINIC_OR_DEPARTMENT_OTHER): Payer: Medicare Other

## 2014-02-22 ENCOUNTER — Other Ambulatory Visit: Payer: Self-pay | Admitting: Hematology and Oncology

## 2014-02-22 ENCOUNTER — Other Ambulatory Visit: Payer: Medicare Other

## 2014-02-22 ENCOUNTER — Ambulatory Visit (HOSPITAL_BASED_OUTPATIENT_CLINIC_OR_DEPARTMENT_OTHER): Payer: Medicare Other

## 2014-02-22 ENCOUNTER — Telehealth: Payer: Self-pay | Admitting: Hematology and Oncology

## 2014-02-22 VITALS — BP 147/71 | HR 78 | Temp 97.7°F | Resp 20 | Ht 65.0 in | Wt 156.8 lb

## 2014-02-22 DIAGNOSIS — G4700 Insomnia, unspecified: Secondary | ICD-10-CM

## 2014-02-22 DIAGNOSIS — M8708 Idiopathic aseptic necrosis of bone, other site: Secondary | ICD-10-CM

## 2014-02-22 DIAGNOSIS — D649 Anemia, unspecified: Secondary | ICD-10-CM

## 2014-02-22 DIAGNOSIS — R079 Chest pain, unspecified: Secondary | ICD-10-CM

## 2014-02-22 DIAGNOSIS — C9000 Multiple myeloma not having achieved remission: Secondary | ICD-10-CM

## 2014-02-22 DIAGNOSIS — Z5112 Encounter for antineoplastic immunotherapy: Secondary | ICD-10-CM

## 2014-02-22 DIAGNOSIS — M87180 Osteonecrosis due to drugs, jaw: Secondary | ICD-10-CM

## 2014-02-22 DIAGNOSIS — I10 Essential (primary) hypertension: Secondary | ICD-10-CM

## 2014-02-22 DIAGNOSIS — R0781 Pleurodynia: Secondary | ICD-10-CM

## 2014-02-22 DIAGNOSIS — D696 Thrombocytopenia, unspecified: Secondary | ICD-10-CM

## 2014-02-22 LAB — CBC WITH DIFFERENTIAL/PLATELET
BASO%: 1.3 % (ref 0.0–2.0)
Basophils Absolute: 0.1 10*3/uL (ref 0.0–0.1)
EOS ABS: 0.3 10*3/uL (ref 0.0–0.5)
EOS%: 7.9 % — ABNORMAL HIGH (ref 0.0–7.0)
HEMATOCRIT: 34.1 % — AB (ref 34.8–46.6)
HGB: 11.2 g/dL — ABNORMAL LOW (ref 11.6–15.9)
LYMPH%: 22.2 % (ref 14.0–49.7)
MCH: 30.8 pg (ref 25.1–34.0)
MCHC: 32.9 g/dL (ref 31.5–36.0)
MCV: 93.4 fL (ref 79.5–101.0)
MONO#: 0.7 10*3/uL (ref 0.1–0.9)
MONO%: 15.3 % — ABNORMAL HIGH (ref 0.0–14.0)
NEUT%: 53.3 % (ref 38.4–76.8)
NEUTROS ABS: 2.3 10*3/uL (ref 1.5–6.5)
Platelets: 120 10*3/uL — ABNORMAL LOW (ref 145–400)
RBC: 3.65 10*6/uL — ABNORMAL LOW (ref 3.70–5.45)
RDW: 15 % — ABNORMAL HIGH (ref 11.2–14.5)
WBC: 4.3 10*3/uL (ref 3.9–10.3)
lymph#: 1 10*3/uL (ref 0.9–3.3)

## 2014-02-22 LAB — COMPREHENSIVE METABOLIC PANEL (CC13)
ALT: 18 U/L (ref 0–55)
ANION GAP: 13 meq/L — AB (ref 3–11)
AST: 22 U/L (ref 5–34)
Albumin: 3.1 g/dL — ABNORMAL LOW (ref 3.5–5.0)
Alkaline Phosphatase: 47 U/L (ref 40–150)
BILIRUBIN TOTAL: 0.68 mg/dL (ref 0.20–1.20)
BUN: 14.3 mg/dL (ref 7.0–26.0)
CALCIUM: 9.3 mg/dL (ref 8.4–10.4)
CHLORIDE: 106 meq/L (ref 98–109)
CO2: 22 meq/L (ref 22–29)
CREATININE: 0.9 mg/dL (ref 0.6–1.1)
GLUCOSE: 98 mg/dL (ref 70–140)
Potassium: 3.7 mEq/L (ref 3.5–5.1)
Sodium: 141 mEq/L (ref 136–145)
Total Protein: 6 g/dL — ABNORMAL LOW (ref 6.4–8.3)

## 2014-02-22 MED ORDER — BORTEZOMIB CHEMO IV INJECTION 3.5 MG
1.3000 mg/m2 | Freq: Once | INTRAMUSCULAR | Status: AC
  Administered 2014-02-22: 2.3 mg via INTRAVENOUS
  Filled 2014-02-22: qty 2.3

## 2014-02-22 MED ORDER — ONDANSETRON 8 MG/NS 50 ML IVPB
INTRAVENOUS | Status: AC
  Filled 2014-02-22: qty 8

## 2014-02-22 MED ORDER — HEPARIN SOD (PORK) LOCK FLUSH 100 UNIT/ML IV SOLN
500.0000 [IU] | Freq: Once | INTRAVENOUS | Status: AC | PRN
  Administered 2014-02-22: 500 [IU]
  Filled 2014-02-22: qty 5

## 2014-02-22 MED ORDER — DEXAMETHASONE 4 MG PO TABS
ORAL_TABLET | ORAL | Status: AC
  Filled 2014-02-22: qty 5

## 2014-02-22 MED ORDER — SODIUM CHLORIDE 0.9 % IJ SOLN
10.0000 mL | INTRAMUSCULAR | Status: DC | PRN
  Administered 2014-02-22: 10 mL
  Filled 2014-02-22: qty 10

## 2014-02-22 MED ORDER — SODIUM CHLORIDE 0.9 % IV SOLN
Freq: Once | INTRAVENOUS | Status: AC
  Administered 2014-02-22: 14:00:00 via INTRAVENOUS

## 2014-02-22 MED ORDER — ONDANSETRON 8 MG/50ML IVPB (CHCC)
8.0000 mg | Freq: Once | INTRAVENOUS | Status: AC
  Administered 2014-02-22: 8 mg via INTRAVENOUS

## 2014-02-22 MED ORDER — LENALIDOMIDE 5 MG PO CAPS: 5.0000 mg | ORAL_CAPSULE | Freq: Every day | ORAL | Status: DC

## 2014-02-22 MED ORDER — DEXAMETHASONE 4 MG PO TABS
20.0000 mg | ORAL_TABLET | ORAL | Status: DC
  Administered 2014-02-22: 20 mg via ORAL

## 2014-02-22 NOTE — Assessment & Plan Note (Signed)
Continue current treatment without dosage adjustment.

## 2014-02-22 NOTE — Telephone Encounter (Signed)
Left VM and My Chart message to complete [employer] Survey for her refill.

## 2014-02-22 NOTE — Assessment & Plan Note (Signed)
Continue conservative management with mild pain medicine, calcium with vitamin D.

## 2014-02-22 NOTE — Patient Instructions (Signed)
[facility] Discharge Instructions for Patients Receiving Chemotherapy  Today you received the following chemotherapy agents Velcade.   To help prevent nausea and vomiting after your treatment, we encourage you to take your nausea medication ad directed.    If you develop nausea and vomiting that is not controlled by your nausea medication, call the clinic.   BELOW ARE SYMPTOMS THAT SHOULD BE REPORTED IMMEDIATELY:  *FEVER GREATER THAN 100.5 F  *CHILLS WITH OR WITHOUT FEVER  NAUSEA AND VOMITING THAT IS NOT CONTROLLED WITH YOUR NAUSEA MEDICATION  *UNUSUAL SHORTNESS OF BREATH  *UNUSUAL BRUISING OR BLEEDING  TENDERNESS IN MOUTH AND THROAT WITH OR WITHOUT PRESENCE OF ULCERS  *URINARY PROBLEMS  *BOWEL PROBLEMS  UNUSUAL RASH Items with * indicate a potential emergency and should be followed up as soon as possible.  Feel free to call the clinic you have any questions or concerns. The clinic phone number is [phone].    

## 2014-02-22 NOTE — Assessment & Plan Note (Signed)
Continue to hold IV bisphosphonate. She will continue dental care by her dentist.

## 2014-02-22 NOTE — Addendum Note (Signed)
Addended by: Tania Ade on: 02/22/2014 12:35 PM   Modules accepted: Orders

## 2014-02-22 NOTE — Telephone Encounter (Signed)
gv and printed appt sched and avs for pt for June....sed aded tx.

## 2014-02-22 NOTE — Assessment & Plan Note (Signed)
I suspect there may be an element of anxiety. Continue current regimen.

## 2014-02-22 NOTE — Assessment & Plan Note (Signed)
She responded well to trazodone. Continue the same.

## 2014-02-22 NOTE — Assessment & Plan Note (Signed)
Continue close observation.

## 2014-02-22 NOTE — Telephone Encounter (Signed)
Refill request for Revlimid to MD desk for review.

## 2014-02-22 NOTE — Assessment & Plan Note (Signed)
I recommend we continue treatment without dosage adjustment. She will continue on low dose Revlimid 5 mg, 21 days on 7 days off along with weekly dexamethasone with Velcade.

## 2014-02-22 NOTE — Progress Notes (Signed)
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    04/08/2012 Bone Marrow Biopsy BM biopsy showed 92% plasma cell, loss chromosome 8 and 13, addition of chromosome 8p, 14q and Myeloma FISH showed 13q and extra chromosome 14   04/12/2012 - 10/20/2012 Chemotherapy Velcade 3 weeks on 1 week off, Revlimid 15 mg PO days 1-21 days, dexamethasone 40 mg weekly.    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   01/30/2014 Procedure She has placement of Infuse-a-Port   02/01/2014 -  Chemotherapy Due to progression of disease, Velcade was added back. Revlimid doses reduced to 5 mg, 21 days on 7 days off    INTERVAL HISTORY: Please see below for problem oriented charting. The patient is here today prior to week 5 of treatment. She is doing well apart from some mild bone pain.  REVIEW OF SYSTEMS:   Constitutional: Denies fevers, chills or abnormal weight loss Eyes: Denies blurriness of vision Ears, nose, mouth, throat, and face: Denies mucositis or sore  throat Respiratory: Denies cough, dyspnea or wheezes Cardiovascular: Denies palpitation, chest discomfort or lower extremity swelling Gastrointestinal:  Denies nausea, heartburn or change in bowel habits Skin: Denies abnormal skin rashes Lymphatics: Denies new lymphadenopathy or easy bruising Neurological:Denies numbness, tingling or new weaknesses Behavioral/Psych: Mood is stable, no new changes  All other systems were reviewed with the patient and are negative.  I have reviewed the past medical history, past surgical history, social history and family history with the patient and they are unchanged from previous note.  ALLERGIES:  is allergic to simvastatin.  MEDICATIONS:  Current Outpatient Prescriptions  Medication Sig Dispense Refill  . acetaminophen (TYLENOL) 500 MG tablet Take 500-1,000 mg by mouth every 4 (four) hours as needed (Pain). No more than 6 tabs in one day.      Marland Kitchen acyclovir (ZOVIRAX) 400 MG tablet Take 1 tablet (400 mg total) by mouth daily.  30 tablet  6  . ALPRAZolam (XANAX) 1 MG tablet TAKE ONE-HALF - ONE TABLET EVERY DAY AS NEEDED FOR SLEEP OR FOR ANXIETY  30 tablet  0  . aspirin EC 81 MG tablet Take 81 mg by mouth daily.      . Calcium Carbonate-Vitamin D (CALCIUM 500 + D PO) Take 1 tablet by mouth 2 (two) times daily.      . Cholecalciferol (VITAMIN D-3) 5000 UNITS TABS Take 1 tablet by mouth daily.       Marland Kitchen ibuprofen (ADVIL,MOTRIN) 200 MG tablet Take 200 mg by mouth every 6 (six) hours as needed.      Marland Kitchen  lenalidomide (REVLIMID) 5 MG capsule Take 1 capsule (5 mg total) by mouth daily.  21 capsule  0  . lidocaine-prilocaine (EMLA) cream Apply 1 application topically as needed.  30 g  0  . losartan (COZAAR) 50 MG tablet Take 50 mg by mouth every morning.      . Multiple Vitamin (MULTIVITAMIN WITH MINERALS) TABS Take 1 tablet by mouth daily.      . ondansetron (ZOFRAN) 4 MG tablet Take 4 mg by mouth every 4 (four) hours as needed for nausea.       . ondansetron  (ZOFRAN) 8 MG tablet Take 1 tablet (8 mg total) by mouth every 8 (eight) hours as needed for nausea or vomiting.  30 tablet  1  . potassium chloride SA (K-DUR,KLOR-CON) 20 MEQ tablet TAKE ONE TABLET EVERY DAY  60 tablet  0  . prochlorperazine (COMPAZINE) 10 MG tablet Take 10 mg by mouth every 6 (six) hours as needed for nausea.       . prochlorperazine (COMPAZINE) 10 MG tablet Take 1 tablet (10 mg total) by mouth every 6 (six) hours as needed (Nausea or vomiting).  30 tablet  1  . rosuvastatin (CRESTOR) 10 MG tablet Take 10 mg by mouth daily.      . traMADol (ULTRAM) 50 MG tablet Take 1 tablet (50 mg total) by mouth every 8 (eight) hours as needed for pain.  60 tablet  3  . traZODone (DESYREL) 50 MG tablet Take 1 tablet (50 mg total) by mouth at bedtime as needed for sleep.  30 tablet  1   No current facility-administered medications for this visit.   Facility-Administered Medications Ordered in Other Visits  Medication Dose Route Frequency Provider Last Rate Last Dose  . dexamethasone (DECADRON) tablet 20 mg  20 mg Oral Weekly Heath Lark, MD   20 mg at 02/22/14 1424  . sodium chloride 0.9 % injection 10 mL  10 mL Intracatheter PRN Heath Lark, MD   10 mL at 02/22/14 1456    PHYSICAL EXAMINATION: ECOG PERFORMANCE STATUS: 0 - Asymptomatic  Filed Vitals:   02/22/14 1245  BP: 147/71  Pulse: 78  Temp: 97.7 F (36.5 C)  Resp: 20   Filed Weights   02/22/14 1245  Weight: 156 lb 12.8 oz (71.124 kg)    GENERAL:alert, no distress and comfortable SKIN: She had mild rosacea on her nose and some skin burn from recent sun exposure. EYES: normal, Conjunctiva are pink and non-injected, sclera clear OROPHARYNX:no exudate, no erythema and lips, buccal mucosa, and tongue normal . Poor dentition is noted. No evidence of new changes in the jaw. NECK: supple, thyroid normal size, non-tender, without nodularity LYMPH:  no palpable lymphadenopathy in the cervical, axillary or inguinal LUNGS: clear to  auscultation and percussion with normal breathing effort HEART: regular rate & rhythm and no murmurs and no lower extremity edema ABDOMEN:abdomen soft, non-tender and normal bowel sounds Musculoskeletal:no cyanosis of digits and no clubbing  NEURO: alert & oriented x 3 with fluent speech, no focal motor/sensory deficits  LABORATORY DATA:  I have reviewed the data as listed    Component Value Date/Time   NA 141 02/22/2014 1234   NA 139 07/10/2013 1001   K 3.7 02/22/2014 1234   K 3.9 07/10/2013 1001   CL 106 07/10/2013 1001   CL 105 02/23/2013 1334   CO2 22 02/22/2014 1234   CO2 27 07/10/2013 1001   GLUCOSE 98 02/22/2014 1234   GLUCOSE 85 07/10/2013 1001  GLUCOSE 83 02/23/2013 1334   GLUCOSE 96 09/30/2006 1322   BUN 14.3 02/22/2014 1234   BUN 18 07/10/2013 1001   CREATININE 0.9 02/22/2014 1234   CREATININE 1.16* 07/10/2013 1001   CREATININE 1.17* 03/23/2013 1155   CALCIUM 9.3 02/22/2014 1234   CALCIUM 8.8 07/10/2013 1001   CALCIUM 11.8* 04/04/2012 0420   PROT 6.0* 02/22/2014 1234   PROT 6.3 11/25/2012 1050   ALBUMIN 3.1* 02/22/2014 1234   ALBUMIN 4.2 11/25/2012 1050   AST 22 02/22/2014 1234   AST 13 11/25/2012 1050   ALT 18 02/22/2014 1234   ALT 16 11/25/2012 1050   ALKPHOS 47 02/22/2014 1234   ALKPHOS 38* 11/25/2012 1050   BILITOT 0.68 02/22/2014 1234   BILITOT 1.0 11/25/2012 1050   GFRNONAA 46* 07/10/2013 1001   GFRNONAA 51* 11/27/2012 1237   GFRAA 53* 07/10/2013 1001   GFRAA 59* 11/27/2012 1237    No results found for this basename: SPEP, UPEP,  kappa and lambda light chains    Lab Results  Component Value Date   WBC 4.3 02/22/2014   NEUTROABS 2.3 02/22/2014   HGB 11.2* 02/22/2014   HCT 34.1* 02/22/2014   MCV 93.4 02/22/2014   PLT 120* 02/22/2014      Chemistry      Component Value Date/Time   NA 141 02/22/2014 1234   NA 139 07/10/2013 1001   K 3.7 02/22/2014 1234   K 3.9 07/10/2013 1001   CL 106 07/10/2013 1001   CL 105 02/23/2013 1334   CO2 22 02/22/2014 1234   CO2 27 07/10/2013  1001   BUN 14.3 02/22/2014 1234   BUN 18 07/10/2013 1001   CREATININE 0.9 02/22/2014 1234   CREATININE 1.16* 07/10/2013 1001   CREATININE 1.17* 03/23/2013 1155      Component Value Date/Time   CALCIUM 9.3 02/22/2014 1234   CALCIUM 8.8 07/10/2013 1001   CALCIUM 11.8* 04/04/2012 0420   ALKPHOS 47 02/22/2014 1234   ALKPHOS 38* 11/25/2012 1050   AST 22 02/22/2014 1234   AST 13 11/25/2012 1050   ALT 18 02/22/2014 1234   ALT 16 11/25/2012 1050   BILITOT 0.68 02/22/2014 1234   BILITOT 1.0 11/25/2012 1050     ASSESSMENT & PLAN:  Multiple myeloma I recommend we continue treatment without dosage adjustment. She will continue on low dose Revlimid 5 mg, 21 days on 7 days off along with weekly dexamethasone with Velcade.  Hypertension I suspect there may be an element of anxiety. Continue current regimen.  INSOMNIA She responded well to trazodone. Continue the same.  Anemia Continue close observation.  Thrombocytopenia, unspecified Continue current treatment without dosage adjustment.  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 She will continue dental care by her dentist.  Rib pain Continue conservative management with mild pain medicine, calcium with vitamin D.   All questions were answered. The patient knows to call the clinic with any problems, questions or concerns. No barriers to learning was detected.   Heath Lark, MD 02/22/2014 3:24 PM

## 2014-02-23 ENCOUNTER — Other Ambulatory Visit: Payer: Self-pay | Admitting: Hematology and Oncology

## 2014-02-28 ENCOUNTER — Telehealth: Payer: Self-pay | Admitting: *Deleted

## 2014-02-28 NOTE — Telephone Encounter (Signed)
Pt reports lump size of small orange right lateral ribcage under her arm.  States very tender to touch. Asks if Dr. Alvy Bimler will look at it when she is here for chemo tomorrow?  Pt scheduled for lab at 3 pm and chemo at 3:30 pm tomorrow.

## 2014-03-01 ENCOUNTER — Other Ambulatory Visit: Payer: Self-pay | Admitting: Hematology and Oncology

## 2014-03-01 ENCOUNTER — Other Ambulatory Visit: Payer: Medicare Other | Admitting: Lab

## 2014-03-01 ENCOUNTER — Other Ambulatory Visit (HOSPITAL_BASED_OUTPATIENT_CLINIC_OR_DEPARTMENT_OTHER): Payer: Medicare Other

## 2014-03-01 ENCOUNTER — Ambulatory Visit (HOSPITAL_BASED_OUTPATIENT_CLINIC_OR_DEPARTMENT_OTHER): Payer: Medicare Other

## 2014-03-01 VITALS — BP 165/78 | HR 72 | Temp 97.7°F

## 2014-03-01 DIAGNOSIS — Z5111 Encounter for antineoplastic chemotherapy: Secondary | ICD-10-CM

## 2014-03-01 DIAGNOSIS — C9000 Multiple myeloma not having achieved remission: Secondary | ICD-10-CM

## 2014-03-01 DIAGNOSIS — G4700 Insomnia, unspecified: Secondary | ICD-10-CM

## 2014-03-01 DIAGNOSIS — R0781 Pleurodynia: Secondary | ICD-10-CM

## 2014-03-01 LAB — COMPREHENSIVE METABOLIC PANEL (CC13)
ALBUMIN: 3.1 g/dL — AB (ref 3.5–5.0)
ALT: 19 U/L (ref 0–55)
AST: 22 U/L (ref 5–34)
Alkaline Phosphatase: 52 U/L (ref 40–150)
Anion Gap: 14 mEq/L — ABNORMAL HIGH (ref 3–11)
BUN: 10.2 mg/dL (ref 7.0–26.0)
CALCIUM: 9.1 mg/dL (ref 8.4–10.4)
CHLORIDE: 107 meq/L (ref 98–109)
CO2: 19 meq/L — AB (ref 22–29)
Creatinine: 1 mg/dL (ref 0.6–1.1)
Glucose: 81 mg/dl (ref 70–140)
POTASSIUM: 3.8 meq/L (ref 3.5–5.1)
Sodium: 140 mEq/L (ref 136–145)
Total Bilirubin: 0.69 mg/dL (ref 0.20–1.20)
Total Protein: 6.1 g/dL — ABNORMAL LOW (ref 6.4–8.3)

## 2014-03-01 LAB — CBC WITH DIFFERENTIAL/PLATELET
BASO%: 1.2 % (ref 0.0–2.0)
Basophils Absolute: 0 10*3/uL (ref 0.0–0.1)
EOS%: 5.2 % (ref 0.0–7.0)
Eosinophils Absolute: 0.2 10*3/uL (ref 0.0–0.5)
HCT: 35.3 % (ref 34.8–46.6)
HGB: 11.8 g/dL (ref 11.6–15.9)
LYMPH%: 35.8 % (ref 14.0–49.7)
MCH: 31.1 pg (ref 25.1–34.0)
MCHC: 33.4 g/dL (ref 31.5–36.0)
MCV: 93.2 fL (ref 79.5–101.0)
MONO#: 0.4 10*3/uL (ref 0.1–0.9)
MONO%: 11.5 % (ref 0.0–14.0)
NEUT#: 1.6 10*3/uL (ref 1.5–6.5)
NEUT%: 46.3 % (ref 38.4–76.8)
Platelets: 155 10*3/uL (ref 145–400)
RBC: 3.79 10*6/uL (ref 3.70–5.45)
RDW: 15.3 % — AB (ref 11.2–14.5)
WBC: 3.5 10*3/uL — AB (ref 3.9–10.3)
lymph#: 1.3 10*3/uL (ref 0.9–3.3)

## 2014-03-01 MED ORDER — ONDANSETRON 8 MG/NS 50 ML IVPB
INTRAVENOUS | Status: AC
  Filled 2014-03-01: qty 8

## 2014-03-01 MED ORDER — SODIUM CHLORIDE 0.9 % IV SOLN
Freq: Once | INTRAVENOUS | Status: AC
  Administered 2014-03-01: 16:00:00 via INTRAVENOUS

## 2014-03-01 MED ORDER — SODIUM CHLORIDE 0.9 % IJ SOLN
10.0000 mL | INTRAMUSCULAR | Status: DC | PRN
  Administered 2014-03-01: 10 mL
  Filled 2014-03-01: qty 10

## 2014-03-01 MED ORDER — OXYCODONE HCL 5 MG PO TABS: 5.0000 mg | ORAL_TABLET | ORAL | Status: DC | PRN

## 2014-03-01 MED ORDER — BORTEZOMIB CHEMO IV INJECTION 3.5 MG
1.3000 mg/m2 | Freq: Once | INTRAMUSCULAR | Status: AC
Start: 2014-03-01 — End: 2014-03-01
  Administered 2014-03-01: 2.3 mg via INTRAVENOUS
  Filled 2014-03-01: qty 2.3

## 2014-03-01 MED ORDER — DEXAMETHASONE 4 MG PO TABS
ORAL_TABLET | ORAL | Status: AC
  Filled 2014-03-01: qty 5

## 2014-03-01 MED ORDER — DEXAMETHASONE 4 MG PO TABS
20.0000 mg | ORAL_TABLET | ORAL | Status: DC
  Administered 2014-03-01: 20 mg via ORAL

## 2014-03-01 MED ORDER — ONDANSETRON 8 MG/50ML IVPB (CHCC)
8.0000 mg | Freq: Once | INTRAVENOUS | Status: AC
  Administered 2014-03-01: 8 mg via INTRAVENOUS

## 2014-03-01 MED ORDER — HEPARIN SOD (PORK) LOCK FLUSH 100 UNIT/ML IV SOLN
500.0000 [IU] | Freq: Once | INTRAVENOUS | Status: AC | PRN
  Administered 2014-03-01: 500 [IU]
  Filled 2014-03-01: qty 5

## 2014-03-01 NOTE — Progress Notes (Signed)
The patient complained of rib pain. 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 She has a palpable mass in the right rib cage area. I will proceed to order repeat CT scan and prescribe oxycodone for pain.

## 2014-03-02 ENCOUNTER — Other Ambulatory Visit: Payer: Self-pay | Admitting: Family Medicine

## 2014-03-06 LAB — SPEP & IFE WITH QIG
ALBUMIN ELP: 55.1 % — AB (ref 55.8–66.1)
ALPHA-1-GLOBULIN: 6.7 % — AB (ref 2.9–4.9)
Alpha-2-Globulin: 12.7 % — ABNORMAL HIGH (ref 7.1–11.8)
BETA 2: 2.4 % — AB (ref 3.2–6.5)
BETA GLOBULIN: 13.7 % — AB (ref 4.7–7.2)
Gamma Globulin: 9.4 % — ABNORMAL LOW (ref 11.1–18.8)
IgA: 271 mg/dL (ref 69–380)
IgG (Immunoglobin G), Serum: 563 mg/dL — ABNORMAL LOW (ref 690–1700)
IgM, Serum: 6 mg/dL — ABNORMAL LOW (ref 52–322)
M-Spike, %: 0.47 g/dL
Total Protein, Serum Electrophoresis: 6.1 g/dL (ref 6.0–8.3)

## 2014-03-06 LAB — BETA 2 MICROGLOBULIN, SERUM: BETA 2 MICROGLOBULIN: 4.78 mg/L — AB (ref <=2.51)

## 2014-03-06 LAB — KAPPA/LAMBDA LIGHT CHAINS
Kappa free light chain: 3.49 mg/dL — ABNORMAL HIGH (ref 0.33–1.94)
Kappa:Lambda Ratio: 5.37 — ABNORMAL HIGH (ref 0.26–1.65)
Lambda Free Lght Chn: 0.65 mg/dL (ref 0.57–2.63)

## 2014-03-08 ENCOUNTER — Encounter: Payer: Self-pay | Admitting: Hematology and Oncology

## 2014-03-08 ENCOUNTER — Ambulatory Visit: Payer: Medicare Other

## 2014-03-08 ENCOUNTER — Ambulatory Visit (HOSPITAL_COMMUNITY)
Admission: RE | Admit: 2014-03-08 | Discharge: 2014-03-08 | Disposition: A | Discharge status: Home / Self Care | Payer: Medicare Other | Source: Ambulatory Visit | Attending: Hematology and Oncology | Admitting: Hematology and Oncology

## 2014-03-08 ENCOUNTER — Other Ambulatory Visit: Payer: Medicare Other

## 2014-03-08 ENCOUNTER — Encounter (HOSPITAL_COMMUNITY): Payer: Self-pay

## 2014-03-08 ENCOUNTER — Telehealth: Payer: Self-pay | Admitting: Hematology and Oncology

## 2014-03-08 DIAGNOSIS — Z79899 Other long term (current) drug therapy: Secondary | ICD-10-CM | POA: Insufficient documentation

## 2014-03-08 DIAGNOSIS — R590 Localized enlarged lymph nodes: Secondary | ICD-10-CM | POA: Insufficient documentation

## 2014-03-08 DIAGNOSIS — C9000 Multiple myeloma not having achieved remission: Secondary | ICD-10-CM | POA: Insufficient documentation

## 2014-03-08 DIAGNOSIS — R0781 Pleurodynia: Secondary | ICD-10-CM

## 2014-03-08 HISTORY — DX: Localized enlarged lymph nodes: R59.0

## 2014-03-08 NOTE — Telephone Encounter (Signed)
I reviewed the CT scan with the patient. I am concerned about new lymphadenopathy which is unusual for multiple myeloma. Her myeloma M spike and light chains are getting worse and she is not responding to treatment. I would discontinue all chemotherapy. I am referring her to Gen. surgery for excisional biopsy of the right lymph node to exclude other malignancy such as lymphoma. I have rescheduled appointment to see me to June 26th

## 2014-03-13 ENCOUNTER — Telehealth: Payer: Self-pay | Admitting: *Deleted

## 2014-03-13 NOTE — Telephone Encounter (Signed)
Pt states her pain is doing much better.  Instructed pt to let us know when her surgery is scheduled and then we will schedule her a f/u appt w/ Dr. Alvy Bimler for 7 days after the surgery.  Call us sooner if she needs anything or thinks she needs to be seen sooner.  Will cancel her appts on 6/26.  Pt verbalized understanding.

## 2014-03-13 NOTE — Telephone Encounter (Signed)
Pt asks if she is supposed to keep her appt on 6/26 to see Dr. Alvy Bimler?  She has appt to see Surgeon on 6/29.

## 2014-03-13 NOTE — Telephone Encounter (Signed)
If her pain is OK, we can reschedule to 7 days after her scheduled surgery Please ask her to call us once surgery is scheduled

## 2014-03-15 ENCOUNTER — Ambulatory Visit: Payer: Medicare Other | Admitting: Hematology and Oncology

## 2014-03-15 ENCOUNTER — Other Ambulatory Visit: Payer: Medicare Other

## 2014-03-15 ENCOUNTER — Ambulatory Visit: Payer: Medicare Other

## 2014-03-21 ENCOUNTER — Other Ambulatory Visit: Payer: Self-pay | Admitting: *Deleted

## 2014-03-21 DIAGNOSIS — R0781 Pleurodynia: Secondary | ICD-10-CM

## 2014-03-21 MED ORDER — OXYCODONE HCL 5 MG PO TABS: 5.0000 mg | ORAL_TABLET | ORAL | Status: DC | PRN

## 2014-03-22 ENCOUNTER — Ambulatory Visit: Payer: Medicare Other

## 2014-03-22 ENCOUNTER — Other Ambulatory Visit: Payer: Medicare Other

## 2014-03-23 ENCOUNTER — Ambulatory Visit: Payer: Medicare Other | Admitting: Hematology and Oncology

## 2014-03-23 ENCOUNTER — Other Ambulatory Visit: Payer: Medicare Other

## 2014-03-26 ENCOUNTER — Ambulatory Visit (INDEPENDENT_AMBULATORY_CARE_PROVIDER_SITE_OTHER): Payer: Medicare Other | Admitting: Surgery

## 2014-03-26 ENCOUNTER — Encounter (INDEPENDENT_AMBULATORY_CARE_PROVIDER_SITE_OTHER): Payer: Self-pay | Admitting: Surgery

## 2014-03-26 VITALS — BP 150/85 | HR 70 | Temp 96.7°F | Resp 16 | Ht 66.0 in | Wt 153.2 lb

## 2014-03-26 DIAGNOSIS — R599 Enlarged lymph nodes, unspecified: Secondary | ICD-10-CM

## 2014-03-26 DIAGNOSIS — R590 Localized enlarged lymph nodes: Secondary | ICD-10-CM

## 2014-03-26 NOTE — Patient Instructions (Signed)
°Lymphadenopathy °Lymphadenopathy means "disease of the lymph glands." But the term is usually used to describe swollen or enlarged lymph glands, also called lymph nodes. These are the bean-shaped organs found in many locations including the neck, underarm, and groin. Lymph glands are part of the immune system, which fights infections in your body. Lymphadenopathy can occur in just one area of the body, such as the neck, or it can be generalized, with lymph node enlargement in several areas. The nodes found in the neck are the most common sites of lymphadenopathy. °CAUSES  °When your immune system responds to germs (such as viruses or bacteria ), infection-fighting cells and fluid build up. This causes the glands to grow in size. This is usually not something to worry about. Sometimes, the glands themselves can become infected and inflamed. This is called lymphadenitis. °Enlarged lymph nodes can be caused by many diseases: °· Bacterial disease, such as strep throat or a skin infection. °· Viral disease, such as a common cold. °· Other germs, such as lyme disease, tuberculosis, or sexually transmitted diseases. °· Cancers, such as lymphoma (cancer of the lymphatic system) or leukemia (cancer of the white blood cells). °· Inflammatory diseases such as lupus or rheumatoid arthritis. °· Reactions to medications. °Many of the diseases above are rare, but important. This is why you should see your caregiver if you have lymphadenopathy. °SYMPTOMS  °· Swollen, enlarged lumps in the neck, back of the head or other locations. °· Tenderness. °· Warmth or redness of the skin over the lymph nodes. °· Fever. °DIAGNOSIS  °Enlarged lymph nodes are often near the source of infection. They can help healthcare providers diagnose your illness. For instance:  °· Swollen lymph nodes around the jaw might be caused by an infection in the mouth. °· Enlarged glands in the neck often signal a throat infection. °· Lymph nodes that are swollen  in more than one area often indicate an illness caused by a virus. °Your caregiver most likely will know what is causing your lymphadenopathy after listening to your history and examining you. Blood tests, x-rays or other tests may be needed. If the cause of the enlarged lymph node cannot be found, and it does not go away by itself, then a biopsy may be needed. Your caregiver will discuss this with you. °TREATMENT  °Treatment for your enlarged lymph nodes will depend on the cause. Many times the nodes will shrink to normal size by themselves, with no treatment. Antibiotics or other medicines may be needed for infection. Only take over-the-counter or prescription medicines for pain, discomfort or fever as directed by your caregiver. °HOME CARE INSTRUCTIONS  °Swollen lymph glands usually return to normal when the underlying medical condition goes away. If they persist, contact your health-care provider. He/she might prescribe antibiotics or other treatments, depending on the diagnosis. Take any medications exactly as prescribed. Keep any follow-up appointments made to check on the condition of your enlarged nodes.  °SEEK MEDICAL CARE IF:  °· Swelling lasts for more than two weeks. °· You have symptoms such as weight loss, night sweats, fatigue or fever that does not go away. °· The lymph nodes are hard, seem fixed to the skin or are growing rapidly. °· Skin over the lymph nodes is red and inflamed. This could mean there is an infection. °SEEK IMMEDIATE MEDICAL CARE IF:  °· Fluid starts leaking from the area of the enlarged lymph node. °· You develop a fever of 102° F (38.9° C) or greater. °· Severe   pain develops (not necessarily at the site of a large lymph node). °· You develop chest pain or shortness of breath. °· You develop worsening abdominal pain. °MAKE SURE YOU:  °· Understand these instructions. °· Will watch your condition. °· Will get help right away if you are not doing well or get worse. °Document  Released: 06/23/2008 Document Revised: 12/07/2011 Document Reviewed: 06/23/2008 °[employer]® Patient Information ©2015 [employer]. This information is not intended to replace advice given to you by your health care provider. Make sure you discuss any questions you have with your health care provider. ° ° ° °

## 2014-03-26 NOTE — Progress Notes (Signed)
Patient ID: [name], female   DOB: [birth year], 79 y.o.   MRN: [id number]  Chief Complaint  Patient presents with  . Lymphadenopathy    HPI [name] is a 78 y.o. female.  Asked to the patient's request of Dr. Alvy Bimler for consideration right axillary lymph node biopsy. Patient being treated for multiple myeloma. She has progression of right axillary lymphadenopathy and biopsy requested to exclude lymphoma. She does have some shortness of breath laying flat. Her treatments are right now on hold. Denies any right arm pain or mass in the right axilla. HPI  Past Medical History  Diagnosis Date  . Anxiety   . Hypertension   . Hyperlipidemia   . Depression   . Osteopenia   . Acute renal failure      due to hypercalcemia and suspected multiple myeloma resolved =with IVF's    . Lytic bone lesions on xray   . Acute subdural hematoma 03/2012     recent fall in hospital stay,resolved+  . Normocytic anemia     due to chemo and multiple myeloma,no active bleding  . Arthritis     spine  . Atrial fibrillation   . Radiation 05/09/12-05/18/12    20 gray in 8 Fx's right hip palliative  . Status post chemotherapy     velcade and revlimid  . Drug-induced osteonecrosis of jaw   . Multiple myeloma 03/2012  . Cancer     mul;tiple myeloma, bone lesions r 7th rib,   . Lymphadenopathy, axillary 03/08/2014    Past Surgical History  Procedure Laterality Date  . Breast lumpectomy    . 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 Tonsillectomy      age 53    Family History  Problem Relation Age of Onset  . Heart disease Mother   . Hyperlipidemia Sister   . Hypertension Sister   . Diabetes Sister   . Diabetes Sister   . Hyperlipidemia Sister   . Hypertension Sister   . Sudden death Neg Hx   . Heart attack Neg Hx     Social History History  Substance Use Topics  . Smoking status: Never  Smoker   . Smokeless tobacco: Never Used  . Alcohol Use: No    Allergies  Allergen Reactions  . Simvastatin Other (See Comments)    Unsure, Joint aches listed previously     Current Outpatient Prescriptions  Medication Sig Dispense Refill  . acetaminophen (TYLENOL) 500 MG tablet Take 500-1,000 mg by mouth every 4 (four) hours as needed (Pain). No more than 6 tabs in one day.      . ALPRAZolam (XANAX) 1 MG tablet TAKE ONE-HALF - ONE TABLET EVERY DAY AS NEEDED FOR ANXIETY  30 tablet  0  . aspirin EC 81 MG tablet Take 81 mg by mouth daily.      . Calcium Carbonate-Vitamin D (CALCIUM 500 + D PO) Take 1 tablet by mouth 2 (two) times daily.      . Cholecalciferol (VITAMIN D-3) 5000 UNITS TABS Take 1 tablet by mouth daily.       Marland Kitchen ibuprofen (ADVIL,MOTRIN) 200 MG tablet Take 200 mg by mouth every 6 (six) hours as needed.      Marland Kitchen lenalidomide (REVLIMID) 5 MG capsule Take 1 capsule (5 mg total) by mouth daily.  21 capsule  0  . lidocaine-prilocaine (EMLA) cream Apply 1 application topically as needed.  30 g  0  . losartan (COZAAR) 50 MG tablet Take 50 mg by mouth every morning.      . Multiple Vitamin (MULTIVITAMIN WITH MINERALS) TABS Take 1 tablet by mouth daily.      . ondansetron (ZOFRAN) 4 MG tablet Take 4 mg by mouth every 4 (four) hours as needed for nausea.       . ondansetron (ZOFRAN) 8 MG tablet Take 1 tablet (8 mg total) by mouth every 8 (eight) hours as needed for nausea or vomiting.  30 tablet  1  . oxyCODONE (ROXICODONE) 5 MG immediate release tablet Take 1 tablet (5 mg total) by mouth every 4 (four) hours as needed for severe pain.  30 tablet  0  . potassium chloride SA (K-DUR,KLOR-CON) 20 MEQ tablet TAKE ONE TABLET EVERY DAY  60 tablet  0  . prochlorperazine (COMPAZINE) 10 MG tablet Take 10 mg by mouth every 6 (six) hours as needed for nausea.       . prochlorperazine (COMPAZINE) 10 MG tablet Take 1 tablet (10 mg total) by mouth every 6 (six) hours as needed (Nausea or vomiting).  30  tablet  1  . rosuvastatin (CRESTOR) 10 MG tablet Take 10 mg by mouth daily.      . traMADol (ULTRAM) 50 MG tablet Take 1 tablet (50 mg total) by mouth every 8 (eight) hours as needed for pain.  60 tablet  3  . traZODone (DESYREL) 50 MG tablet Take 1 tablet (50 mg total) by mouth at bedtime as needed for sleep.  90 tablet  3   No current facility-administered medications for this visit.    Review of Systems Review of Systems  Constitutional: Positive for fatigue.  HENT: Negative.   Eyes: Negative.   Respiratory: Positive for chest tightness and shortness of breath.   Cardiovascular: Negative.   Gastrointestinal: Negative.   Endocrine: Negative.   Genitourinary: Negative.   Hematological: Positive for adenopathy. Bruises/bleeds easily.    Blood pressure 150/85, pulse 70, temperature 96.7 F (35.9 C), resp. rate 16, height $RemoveBe'5\' 6"'VKsBvXLec$  (1.676 m), weight 153 lb 3.2 oz (69.491 kg).  Physical Exam Physical Exam  Constitutional: She appears well-developed and well-nourished.  HENT:  Head: Normocephalic.  Mouth/Throat: No oropharyngeal exudate.  Eyes: Pupils are equal, round, and reactive to light. No scleral icterus.  Neck: Normal range of motion. Neck supple.  Cardiovascular: Normal rate.   Pulmonary/Chest: She exhibits mass, bony tenderness and deformity.    Lymphadenopathy:    She has cervical adenopathy.    She has axillary adenopathy.       Right axillary: Lateral adenopathy present.  Skin: Skin is warm and dry.    Data Reviewed Oncology notes  Chest CT 02/2014  Assessment    Right axillary lymphadenopathy  Multiple myeloma  Patient Active Problem List   Diagnosis Date Noted  . Lymphadenopathy, axillary 03/08/2014  . Thrombocytopenia, unspecified 02/22/2014  . 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 Rib pain 02/22/2014  . Anxiety   . Hypertension   . Hyperlipidemia   . Depression   . Osteopenia   . Acute renal failure   . Lytic bone lesions on xray   .  Normocytic anemia   . Arthritis   . History of atrial fibrillation   . Radiation   . Status post chemotherapy   . Embolism and thrombosis of unspecified site 07/12/2012  . Hypokalemia 05/24/2012  . New onset atrial fibrillation 05/03/2012  . SDH (subdural hematoma) 04/13/2012  . Multiple  myeloma 04/08/2012  . Constipation due to slow transit/hypercalcemia 04/08/2012  . Hypercalcemia 04/04/2012  . Anemia 04/04/2012  . Acute subdural hematoma 03/28/2012  . Obesity 03/03/2012  . BACK PAIN, LUMBAR 11/05/2009  . OSTEOPENIA 11/05/2008  . POSTMENOPAUSAL STATUS 11/05/2008  . DEPRESSION 04/17/2008  . HYPERLIPIDEMIA NEC/NOS 10/19/2006  . ANXIETY 10/19/2006  . HYPERTENSION 10/19/2006  . INSOMNIA 10/19/2006  . HYPERGLYCEMIA 10/19/2006    CLINICAL DATA: Multiple myeloma. Right rib pain. Ongoing  chemotherapy.  EXAM:  CT CHEST WITHOUT CONTRAST  TECHNIQUE:  Multidetector CT imaging of the chest was performed following the  standard protocol without IV contrast.  COMPARISON: 04/06/2012  FINDINGS:  Large right lateral chest wall mass with destruction of the right  lateral seventh rib shows significant increase in size, currently  measuring 6.6 x 9.0 cm compared with 4.4 x 5.7 cm on prior exam.  There is new adjacent lymphadenopathy in the right lateral chest  wall soft tissues and right axilla. Largest index lymph node in the  right axilla measures 1.7 cm on image 22. New lymphadenopathy is  also seen in the right cardiophrenic angle, measuring 1.8 cm on  image 46. Small right pleural effusion is increased in size since  previous study.  No other lytic bone lesions or chest wall mass identified. Mild  compressive atelectasis is seen in the right lower lung. No evidence  of pulmonary infiltrate or mass.  No evidence of adrenal mass. Cholelithiasis incidentally noted.  IMPRESSION:  Increased size of large right lateral chest wall soft tissue mass  with destruction of the left lateral  seventh rib.  New lymphadenopathy in the right lateral chest wall, right axilla,  and right cardiophrenic angle.  Increased size of small right pleural effusion.  Cholelithiasis incidentally noted.  Electronically Signed  By: Earle Gell M.D.  On: 03/08/2014 10:41     Plan    Right axillary lymph node biopsy.The procedure has been discussed with the patient.  Alternative therapies have been discussed with the patient.  Operative risks include bleeding,  Infection,  Organ injury,  Nerve injury,  Blood vessel injury,  DVT,  Pulmonary embolism,  Death,  And possible reoperation.  Medical management risks include worsening of present situation.  The success of the procedure is 50 -90 % at treating patients symptoms.  The patient understands and agrees to proceed.       CORNETT,THOMAS A. 03/26/2014, 2:42 PM

## 2014-03-30 ENCOUNTER — Other Ambulatory Visit: Payer: Self-pay | Admitting: Family Medicine

## 2014-04-02 ENCOUNTER — Other Ambulatory Visit: Payer: Self-pay | Admitting: Hematology and Oncology

## 2014-04-03 ENCOUNTER — Other Ambulatory Visit: Payer: Self-pay

## 2014-04-03 MED ORDER — ALPRAZOLAM 1 MG PO TABS: 1.0000 mg | ORAL_TABLET | Freq: Every evening | ORAL | Status: DC | PRN

## 2014-04-06 ENCOUNTER — Encounter (HOSPITAL_BASED_OUTPATIENT_CLINIC_OR_DEPARTMENT_OTHER): Payer: Self-pay | Admitting: *Deleted

## 2014-04-06 NOTE — Progress Notes (Signed)
To come in for [facility] labs and ekg-ct chest done 6/15

## 2014-04-09 ENCOUNTER — Encounter (HOSPITAL_BASED_OUTPATIENT_CLINIC_OR_DEPARTMENT_OTHER)
Admission: RE | Admit: 2014-04-09 | Discharge: 2014-04-09 | Disposition: A | Discharge status: Home / Self Care | Payer: Medicare Other | Source: Ambulatory Visit | Attending: Surgery | Admitting: Surgery

## 2014-04-09 ENCOUNTER — Encounter: Payer: Self-pay | Admitting: Nutrition

## 2014-04-09 DIAGNOSIS — C9000 Multiple myeloma not having achieved remission: Secondary | ICD-10-CM | POA: Diagnosis not present

## 2014-04-09 DIAGNOSIS — E785 Hyperlipidemia, unspecified: Secondary | ICD-10-CM | POA: Diagnosis not present

## 2014-04-09 DIAGNOSIS — Z79899 Other long term (current) drug therapy: Secondary | ICD-10-CM | POA: Diagnosis not present

## 2014-04-09 DIAGNOSIS — Z7982 Long term (current) use of aspirin: Secondary | ICD-10-CM | POA: Diagnosis not present

## 2014-04-09 DIAGNOSIS — Z923 Personal history of irradiation: Secondary | ICD-10-CM | POA: Diagnosis not present

## 2014-04-09 DIAGNOSIS — R599 Enlarged lymph nodes, unspecified: Secondary | ICD-10-CM | POA: Diagnosis not present

## 2014-04-09 DIAGNOSIS — F329 Major depressive disorder, single episode, unspecified: Secondary | ICD-10-CM | POA: Diagnosis not present

## 2014-04-09 DIAGNOSIS — F411 Generalized anxiety disorder: Secondary | ICD-10-CM | POA: Diagnosis not present

## 2014-04-09 DIAGNOSIS — I10 Essential (primary) hypertension: Secondary | ICD-10-CM | POA: Diagnosis not present

## 2014-04-09 DIAGNOSIS — F3289 Other specified depressive episodes: Secondary | ICD-10-CM | POA: Diagnosis not present

## 2014-04-09 DIAGNOSIS — Z9221 Personal history of antineoplastic chemotherapy: Secondary | ICD-10-CM | POA: Diagnosis not present

## 2014-04-09 LAB — CBC WITH DIFFERENTIAL/PLATELET
BASOS ABS: 0 10*3/uL (ref 0.0–0.1)
Basophils Relative: 1 % (ref 0–1)
Eosinophils Absolute: 0.1 10*3/uL (ref 0.0–0.7)
Eosinophils Relative: 2 % (ref 0–5)
HEMATOCRIT: 38.4 % (ref 36.0–46.0)
Hemoglobin: 12.6 g/dL (ref 12.0–15.0)
Lymphocytes Relative: 37 % (ref 12–46)
Lymphs Abs: 1.5 10*3/uL (ref 0.7–4.0)
MCH: 29.9 pg (ref 26.0–34.0)
MCHC: 32.8 g/dL (ref 30.0–36.0)
MCV: 91 fL (ref 78.0–100.0)
Monocytes Absolute: 0.6 10*3/uL (ref 0.1–1.0)
Monocytes Relative: 14 % — ABNORMAL HIGH (ref 3–12)
NEUTROS ABS: 1.8 10*3/uL (ref 1.7–7.7)
NEUTROS PCT: 46 % (ref 43–77)
Platelets: 216 10*3/uL (ref 150–400)
RBC: 4.22 MIL/uL (ref 3.87–5.11)
RDW: 14.5 % (ref 11.5–15.5)
WBC: 4 10*3/uL (ref 4.0–10.5)

## 2014-04-09 LAB — COMPREHENSIVE METABOLIC PANEL
ALT: 17 U/L (ref 0–35)
AST: 22 U/L (ref 0–37)
Albumin: 3.5 g/dL (ref 3.5–5.2)
Alkaline Phosphatase: 59 U/L (ref 39–117)
Anion gap: 22 — ABNORMAL HIGH (ref 5–15)
BILIRUBIN TOTAL: 0.6 mg/dL (ref 0.3–1.2)
BUN: 16 mg/dL (ref 6–23)
CHLORIDE: 96 meq/L (ref 96–112)
CO2: 22 meq/L (ref 19–32)
Calcium: 10.9 mg/dL — ABNORMAL HIGH (ref 8.4–10.5)
Creatinine, Ser: 0.93 mg/dL (ref 0.50–1.10)
GFR calc Af Amer: 67 mL/min — ABNORMAL LOW (ref >90)
GFR, EST NON AFRICAN AMERICAN: 58 mL/min — AB (ref >90)
Glucose, Bld: 92 mg/dL (ref 70–99)
Potassium: 5 mEq/L (ref 3.7–5.3)
Sodium: 140 mEq/L (ref 137–147)
Total Protein: 7.1 g/dL (ref 6.0–8.3)

## 2014-04-09 NOTE — Progress Notes (Signed)
Patient's husband stopped by nutrition for samples of boost.  States patient has had weight loss.  She has not been eating as well and he has encouraged her to try oral nutrition supplements.  Patient awaiting results of testing.  Provided patient's husband with boost and coupons as well as my contact information.  Encouraged him to call me with questions or concerns.

## 2014-04-10 ENCOUNTER — Encounter (HOSPITAL_BASED_OUTPATIENT_CLINIC_OR_DEPARTMENT_OTHER): Payer: Self-pay | Admitting: *Deleted

## 2014-04-11 ENCOUNTER — Ambulatory Visit (HOSPITAL_BASED_OUTPATIENT_CLINIC_OR_DEPARTMENT_OTHER)
Admission: RE | Admit: 2014-04-11 | Discharge: 2014-04-11 | Disposition: A | Discharge status: Home / Self Care | Payer: Medicare Other | Source: Ambulatory Visit | Attending: Surgery | Admitting: Surgery

## 2014-04-11 ENCOUNTER — Ambulatory Visit (HOSPITAL_BASED_OUTPATIENT_CLINIC_OR_DEPARTMENT_OTHER): Payer: Medicare Other | Admitting: Certified Registered"

## 2014-04-11 ENCOUNTER — Ambulatory Visit (HOSPITAL_COMMUNITY): Payer: Medicare Other

## 2014-04-11 ENCOUNTER — Telehealth: Payer: Self-pay | Admitting: *Deleted

## 2014-04-11 ENCOUNTER — Encounter (HOSPITAL_BASED_OUTPATIENT_CLINIC_OR_DEPARTMENT_OTHER): Payer: Self-pay

## 2014-04-11 ENCOUNTER — Other Ambulatory Visit: Payer: Self-pay | Admitting: Hematology and Oncology

## 2014-04-11 ENCOUNTER — Encounter (HOSPITAL_BASED_OUTPATIENT_CLINIC_OR_DEPARTMENT_OTHER): Payer: Medicare Other | Admitting: Certified Registered"

## 2014-04-11 ENCOUNTER — Encounter (HOSPITAL_BASED_OUTPATIENT_CLINIC_OR_DEPARTMENT_OTHER)
Admission: RE | Disposition: A | Discharge status: Home / Self Care | Payer: Self-pay | Source: Ambulatory Visit | Attending: Surgery

## 2014-04-11 DIAGNOSIS — F3289 Other specified depressive episodes: Secondary | ICD-10-CM | POA: Insufficient documentation

## 2014-04-11 DIAGNOSIS — C9000 Multiple myeloma not having achieved remission: Secondary | ICD-10-CM | POA: Insufficient documentation

## 2014-04-11 DIAGNOSIS — Z7982 Long term (current) use of aspirin: Secondary | ICD-10-CM | POA: Insufficient documentation

## 2014-04-11 DIAGNOSIS — R0781 Pleurodynia: Secondary | ICD-10-CM

## 2014-04-11 DIAGNOSIS — F329 Major depressive disorder, single episode, unspecified: Secondary | ICD-10-CM | POA: Insufficient documentation

## 2014-04-11 DIAGNOSIS — Z79899 Other long term (current) drug therapy: Secondary | ICD-10-CM | POA: Insufficient documentation

## 2014-04-11 DIAGNOSIS — R599 Enlarged lymph nodes, unspecified: Secondary | ICD-10-CM | POA: Insufficient documentation

## 2014-04-11 DIAGNOSIS — F411 Generalized anxiety disorder: Secondary | ICD-10-CM | POA: Insufficient documentation

## 2014-04-11 DIAGNOSIS — Z9221 Personal history of antineoplastic chemotherapy: Secondary | ICD-10-CM | POA: Insufficient documentation

## 2014-04-11 DIAGNOSIS — I10 Essential (primary) hypertension: Secondary | ICD-10-CM | POA: Insufficient documentation

## 2014-04-11 DIAGNOSIS — Z923 Personal history of irradiation: Secondary | ICD-10-CM | POA: Insufficient documentation

## 2014-04-11 DIAGNOSIS — E785 Hyperlipidemia, unspecified: Secondary | ICD-10-CM | POA: Insufficient documentation

## 2014-04-11 HISTORY — PX: AXILLARY LYMPH NODE BIOPSY: SHX5737

## 2014-04-11 SURGERY — AXILLARY LYMPH NODE BIOPSY
Anesthesia: General | Site: Axilla | Laterality: Right

## 2014-04-11 MED ORDER — FENTANYL CITRATE 0.05 MG/ML IJ SOLN
INTRAMUSCULAR | Status: AC
  Filled 2014-04-11: qty 4

## 2014-04-11 MED ORDER — ONDANSETRON HCL 4 MG/2ML IJ SOLN: 4.0000 mg | Freq: Once | INTRAMUSCULAR | Status: DC | PRN

## 2014-04-11 MED ORDER — MIDAZOLAM HCL 2 MG/2ML IJ SOLN: 1.0000 mg | INTRAMUSCULAR | Status: DC | PRN

## 2014-04-11 MED ORDER — OXYCODONE-ACETAMINOPHEN 5-325 MG PO TABS: 1.0000 | ORAL_TABLET | ORAL | Status: DC | PRN

## 2014-04-11 MED ORDER — FENTANYL CITRATE 0.05 MG/ML IJ SOLN: 25.0000 ug | INTRAMUSCULAR | Status: DC | PRN

## 2014-04-11 MED ORDER — DEXAMETHASONE SODIUM PHOSPHATE 4 MG/ML IJ SOLN
INTRAMUSCULAR | Status: DC | PRN
  Administered 2014-04-11: 5 mg via INTRAVENOUS

## 2014-04-11 MED ORDER — OXYCODONE HCL 5 MG PO TABS
5.0000 mg | ORAL_TABLET | Freq: Once | ORAL | Status: AC | PRN
  Administered 2014-04-11: 5 mg via ORAL

## 2014-04-11 MED ORDER — PROPOFOL 10 MG/ML IV BOLUS
INTRAVENOUS | Status: DC | PRN
  Administered 2014-04-11: 150 mg via INTRAVENOUS

## 2014-04-11 MED ORDER — FENTANYL CITRATE 0.05 MG/ML IJ SOLN: 50.0000 ug | INTRAMUSCULAR | Status: DC | PRN

## 2014-04-11 MED ORDER — CEFAZOLIN SODIUM-DEXTROSE 2-3 GM-% IV SOLR
INTRAVENOUS | Status: AC
  Filled 2014-04-11: qty 50

## 2014-04-11 MED ORDER — LACTATED RINGERS IV SOLN
INTRAVENOUS | Status: DC
  Administered 2014-04-11: 10:00:00 via INTRAVENOUS

## 2014-04-11 MED ORDER — CEFAZOLIN SODIUM-DEXTROSE 2-3 GM-% IV SOLR
2.0000 g | INTRAVENOUS | Status: AC
  Administered 2014-04-11: 2 g via INTRAVENOUS

## 2014-04-11 MED ORDER — MIDAZOLAM HCL 2 MG/2ML IJ SOLN
INTRAMUSCULAR | Status: AC
  Filled 2014-04-11: qty 2

## 2014-04-11 MED ORDER — ONDANSETRON HCL 4 MG/2ML IJ SOLN
INTRAMUSCULAR | Status: DC | PRN
  Administered 2014-04-11: 4 mg via INTRAVENOUS

## 2014-04-11 MED ORDER — FENTANYL CITRATE 0.05 MG/ML IJ SOLN
INTRAMUSCULAR | Status: DC | PRN
  Administered 2014-04-11 (×2): 50 ug via INTRAVENOUS

## 2014-04-11 MED ORDER — LIDOCAINE HCL (CARDIAC) 20 MG/ML IV SOLN
INTRAVENOUS | Status: DC | PRN
  Administered 2014-04-11: 75 mg via INTRAVENOUS

## 2014-04-11 MED ORDER — BUPIVACAINE-EPINEPHRINE 0.25% -1:200000 IJ SOLN
INTRAMUSCULAR | Status: DC | PRN
  Administered 2014-04-11: 20 mL

## 2014-04-11 MED ORDER — OXYCODONE-ACETAMINOPHEN 5-325 MG PO TABS: 1.0000 | ORAL_TABLET | Freq: Once | ORAL | Status: DC

## 2014-04-11 MED ORDER — OXYCODONE HCL 5 MG PO TABS
ORAL_TABLET | ORAL | Status: AC
  Filled 2014-04-11: qty 1

## 2014-04-11 MED ORDER — CHLORHEXIDINE GLUCONATE 4 % EX LIQD: 1.0000 "application " | Freq: Once | CUTANEOUS | Status: DC

## 2014-04-11 SURGICAL SUPPLY — 50 items
APPLIER CLIP 9.375 MED OPEN (MISCELLANEOUS)
BLADE SURG 10 STRL SS (BLADE) ×3 IMPLANT
BLADE SURG 15 STRL LF DISP TIS (BLADE) ×1 IMPLANT
BLADE SURG 15 STRL SS (BLADE) ×2
BLADE SURG ROTATE 9660 (MISCELLANEOUS) IMPLANT
CANISTER SUCT 1200ML W/VALVE (MISCELLANEOUS) ×3 IMPLANT
CHLORAPREP W/TINT 26ML (MISCELLANEOUS) ×3 IMPLANT
CLIP APPLIE 9.375 MED OPEN (MISCELLANEOUS) IMPLANT
COVER MAYO STAND STRL (DRAPES) ×3 IMPLANT
COVER TABLE BACK 60X90 (DRAPES) ×3 IMPLANT
DECANTER SPIKE VIAL GLASS SM (MISCELLANEOUS) IMPLANT
DERMABOND ADVANCED (GAUZE/BANDAGES/DRESSINGS) ×2
DERMABOND ADVANCED .7 DNX12 (GAUZE/BANDAGES/DRESSINGS) ×1 IMPLANT
DRAIN CHANNEL 19F RND (DRAIN) IMPLANT
DRAIN HEMOVAC 1/8 X 5 (WOUND CARE) IMPLANT
DRAPE LAPAROSCOPIC ABDOMINAL (DRAPES) ×3 IMPLANT
DRAPE UTILITY XL STRL (DRAPES) ×3 IMPLANT
ELECT COATED BLADE 2.86 ST (ELECTRODE) ×3 IMPLANT
ELECT REM PT RETURN 9FT ADLT (ELECTROSURGICAL) ×3
ELECTRODE REM PT RTRN 9FT ADLT (ELECTROSURGICAL) ×1 IMPLANT
EVACUATOR SILICONE 100CC (DRAIN) IMPLANT
GLOVE BIOGEL PI IND STRL 8 (GLOVE) ×1 IMPLANT
GLOVE BIOGEL PI INDICATOR 8 (GLOVE) ×2
GLOVE ECLIPSE 7.0 STRL STRAW (GLOVE) ×3 IMPLANT
GLOVE ECLIPSE 8.0 STRL XLNG CF (GLOVE) ×3 IMPLANT
GLOVE SURG SS PI 7.0 STRL IVOR (GLOVE) ×3 IMPLANT
GOWN STRL REUS W/ TWL LRG LVL3 (GOWN DISPOSABLE) ×2 IMPLANT
GOWN STRL REUS W/TWL LRG LVL3 (GOWN DISPOSABLE) ×4
HEMOSTAT SNOW SURGICEL 2X4 (HEMOSTASIS) ×3 IMPLANT
HEMOSTAT SURGICEL 2X14 (HEMOSTASIS) IMPLANT
NEEDLE HYPO 25X1 1.5 SAFETY (NEEDLE) ×3 IMPLANT
NS IRRIG 1000ML POUR BTL (IV SOLUTION) ×3 IMPLANT
PACK BASIN DAY SURGERY FS (CUSTOM PROCEDURE TRAY) ×3 IMPLANT
PENCIL BUTTON HOLSTER BLD 10FT (ELECTRODE) ×3 IMPLANT
PIN SAFETY STERILE (MISCELLANEOUS) IMPLANT
SLEEVE SCD COMPRESS KNEE MED (MISCELLANEOUS) ×3 IMPLANT
SPONGE GAUZE 4X4 12PLY STER LF (GAUZE/BANDAGES/DRESSINGS) IMPLANT
SPONGE LAP 18X18 X RAY DECT (DISPOSABLE) IMPLANT
SPONGE LAP 4X18 X RAY DECT (DISPOSABLE) ×3 IMPLANT
SUT ETHILON 3 0 PS 1 (SUTURE) IMPLANT
SUT MNCRL AB 3-0 PS2 18 (SUTURE) ×3 IMPLANT
SUT SILK 2 0 SH (SUTURE) IMPLANT
SUT VICRYL 3-0 CR8 SH (SUTURE) ×3 IMPLANT
SYR BULB 3OZ (MISCELLANEOUS) ×3 IMPLANT
SYRINGE CONTROL L 12CC (SYRINGE) ×3 IMPLANT
TOWEL OR 17X24 6PK STRL BLUE (TOWEL DISPOSABLE) ×3 IMPLANT
TOWEL OR NON WOVEN STRL DISP B (DISPOSABLE) ×3 IMPLANT
TUBE CONNECTING 20'X1/4 (TUBING) ×1
TUBE CONNECTING 20X1/4 (TUBING) ×2 IMPLANT
YANKAUER SUCT BULB TIP NO VENT (SUCTIONS) ×3 IMPLANT

## 2014-04-11 NOTE — Transfer of Care (Signed)
Immediate Anesthesia Transfer of Care Note  Patient: [name]  Procedure(s) Performed: Procedure(s): RIGHT AXILLARY LYMPH NODE BIOPSY (Right)  Patient Location: PACU  Anesthesia Type:General  Level of Consciousness: awake, alert  and patient cooperative  Airway & Oxygen Therapy: Patient Spontanous Breathing and Patient connected to face mask oxygen  Post-op Assessment: Report given to PACU RN and Post -op Vital signs reviewed and stable  Post vital signs: Reviewed and stable  Complications: No apparent anesthesia complications

## 2014-04-11 NOTE — Telephone Encounter (Signed)
Pt left VM reporting she had "surgery" on her lymph node this morning.

## 2014-04-11 NOTE — Op Note (Signed)
Preoperative diagnosis: Right axillary lymphadenopathy  Postoperative diagnosis: Same  Procedure: Right axillary lymph node biopsy deep  Surgeon: Erroll Luna MD  ANESTHESIA: LMA with 0.25% Sensorcaine local with epinephrine  EBL 30 cc  Specimen: Right axillary level II lymph node  Drains: None  Indications for procedure: The patient is a 78 year old female with multiple myeloma. She developed right axillary lymphadenopathy and concern is for the development of a secondary lymphoma. Right axillary lymph node biopsy requested for evaluation lymphoma.The procedure has been discussed with the patient.  Alternative therapies have been discussed with the patient.  Operative risks include bleeding,  Infection,  Organ injury,  Nerve injury,  Blood vessel injury,  DVT,  Pulmonary embolism,  Death,  And possible reoperation.  Medical management risks include worsening of present situation.  The success of the procedure is 50 -90 % at treating patients symptoms.  The patient understands and agrees to proceed.  Description of procedure: Patient met in holding area and questions are answered. Right axilla marked. Patient taken to operating room and placed supine on the OR table. After induction of LMA anesthesia, right axilla prepped and draped in a sterile fashion. Timeout was done to verify correct site. Patient received 2 g of Ancef. 0.25% Sensorcaine infiltrated into right axilla. Incision made at the hairline were 3 cm. Dissection carried into the axilla and level II node was enlarged and removed. Hemostasis achieved with pressure and Surgicel. Wound hemostatic and then closed with deep layer of 3-0 Vicryl and 4-0 Monocryl. Dermabond applied. All final counts found to be correct. Patient taken recovery in satisfactory and stable condition.

## 2014-04-11 NOTE — Discharge Instructions (Signed)
Sentinel Lymph Node Biopsy Care After Refer to this sheet in the next few weeks. These instructions provide you with information on caring for yourself after your procedure. Your caregiver may also give you more specific instructions. Your treatment has been planned according to current medical practices, but problems sometimes occur. Call your caregiver if you have any problems or questions after your procedure. HOME CARE INSTRUCTIONS  Avoid vigorous exercise. Ask your caregiver when you can return to your normal activities.  You may shower 24 hours after your procedure. It is okay to get your surgical cut (incision) wet. Gently pat the incision dry after you shower.  If you are given a surgical bra, wear it for the next 48 hours. You may remove the bra to shower.  Keep all follow-up appointments as directed by your caregiver.  If you have skin adhesive strips over the incision, do not remove them. They will fall off on their own over time.  Only take over-the-counter or prescription medicines for pain, fever, or discomfort as directed by your caregiver.  You may resume your regular diet. SEEK IMMEDIATE MEDICAL CARE IF:   Your pain is not controlled with medicine.  You notice redness, swelling, or increased fluid draining from the incision.  You feel nauseous or vomit. MAKE SURE YOU:  Understand these instructions.  Will watch your condition.  Will get help right away if you are not doing well or get worse. Document Released: 04/28/2004 Document Revised: 12/07/2011 Document Reviewed: 08/10/2011 [facility] Patient Information 2015 [place], [place]. This information is not intended to replace advice given to you by your health care provider. Make sure you discuss any questions you have with your health care provider.   Post Anesthesia Home Care Instructions  Activity: Get plenty of rest for the remainder of the day. A responsible adult should stay with you for 24 hours following  the procedure.  For the next 24 hours, DO NOT: -Drive a car -Paediatric nurse -Drink alcoholic beverages -Take any medication unless instructed by your physician -Make any legal decisions or sign important papers.  Meals: Start with liquid foods such as gelatin or soup. Progress to regular foods as tolerated. Avoid greasy, spicy, heavy foods. If nausea and/or vomiting occur, drink only clear liquids until the nausea and/or vomiting subsides. Call your physician if vomiting continues.  Special Instructions/Symptoms: Your throat may feel dry or sore from the anesthesia or the breathing tube placed in your throat during surgery. If this causes discomfort, gargle with warm salt water. The discomfort should disappear within 24 hours.

## 2014-04-11 NOTE — Interval H&P Note (Signed)
History and Physical Interval Note:  04/11/2014 9:11 AM  [name]  has presented today for surgery, with the diagnosis of lymphadenopathy  The various methods of treatment have been discussed with the patient and family. After consideration of risks, benefits and other options for treatment, the patient has consented to  Procedure(s): RIGHT AXILLARY LYMPH NODE BIOPSY (Right) as a surgical intervention .  The patient's history has been reviewed, patient examined, no change in status, stable for surgery.  I have reviewed the patient's chart and labs.  Questions were answered to the patient's satisfaction.     Rosslyn Pasion A.

## 2014-04-11 NOTE — Telephone Encounter (Signed)
Informed pt of order sent to scheduler for pt to see Dr. Alvy Bimler next week, Wed 7/22 at 12:30 pm. She verbalized understanding.

## 2014-04-11 NOTE — Anesthesia Postprocedure Evaluation (Signed)
  Anesthesia Post-op Note  Patient: [name]  Procedure(s) Performed: Procedure(s): RIGHT AXILLARY LYMPH NODE BIOPSY (Right)  Patient Location: PACU  Anesthesia Type: General   Level of Consciousness: awake, alert  and oriented  Airway and Oxygen Therapy: Patient Spontanous Breathing  Post-op Pain: mild  Post-op Assessment: Post-op Vital signs reviewed  Post-op Vital Signs: Reviewed  Last Vitals:  Filed Vitals:   04/11/14 1203  BP: 129/70  Pulse: 88  Temp: 36.6 C  Resp: 16    Complications: No apparent anesthesia complications

## 2014-04-11 NOTE — Anesthesia Procedure Notes (Signed)
Procedure Name: LMA Insertion Date/Time: 04/11/2014 9:58 AM Performed by: Lyndee Leo Pre-anesthesia Checklist: Patient identified, Emergency Drugs available, Suction available and Patient being monitored Patient Re-evaluated:Patient Re-evaluated prior to inductionOxygen Delivery Method: Circle System Utilized Preoxygenation: Pre-oxygenation with 100% oxygen Intubation Type: IV induction Ventilation: Mask ventilation without difficulty LMA: LMA inserted LMA Size: 4.0 Number of attempts: 1 Airway Equipment and Method: bite block Placement Confirmation: positive ETCO2 Tube secured with: Tape Dental Injury: Teeth and Oropharynx as per pre-operative assessment

## 2014-04-11 NOTE — Telephone Encounter (Signed)
I will see her next Wednesday at 1230 pm, I will place POF

## 2014-04-11 NOTE — Anesthesia Preprocedure Evaluation (Addendum)
Anesthesia Evaluation  Patient identified by MRN, date of birth, ID band Patient awake    Reviewed: Allergy & Precautions, H&P , NPO status , Patient's Chart, lab work & pertinent test results  Airway Mallampati: I TM Distance: >3 FB Neck ROM: Full    Dental  (+) Partial Lower   Pulmonary  breath sounds clear to auscultation        Cardiovascular hypertension, Pt. on medications Rhythm:Regular Rate:Normal     Neuro/Psych    GI/Hepatic   Endo/Other    Renal/GU      Musculoskeletal   Abdominal   Peds  Hematology   Anesthesia Other Findings   Reproductive/Obstetrics                          Anesthesia Physical Anesthesia Plan  ASA: III  Anesthesia Plan: General   Post-op Pain Management:    Induction: Intravenous  Airway Management Planned: LMA  Additional Equipment:   Intra-op Plan:   Post-operative Plan: Extubation in OR  Informed Consent: I have reviewed the patients History and Physical, chart, labs and discussed the procedure including the risks, benefits and alternatives for the proposed anesthesia with the patient or authorized representative who has indicated his/her understanding and acceptance.   Dental advisory given  Plan Discussed with: CRNA, Anesthesiologist and Surgeon  Anesthesia Plan Comments:         Anesthesia Quick Evaluation

## 2014-04-11 NOTE — H&P (View-Only) (Signed)
Patient ID: [name], female   DOB: [birth year], 78 y.o.   MRN: [id number]  Chief Complaint  Patient presents with  . Lymphadenopathy    HPI [name] is a 78 y.o. female.  Asked to the patient's request of Dr. Alvy Bimler for consideration right axillary lymph node biopsy. Patient being treated for multiple myeloma. She has progression of right axillary lymphadenopathy and biopsy requested to exclude lymphoma. She does have some shortness of breath laying flat. Her treatments are right now on hold. Denies any right arm pain or mass in the right axilla. HPI  Past Medical History  Diagnosis Date  . Anxiety   . Hypertension   . Hyperlipidemia   . Depression   . Osteopenia   . Acute renal failure      due to hypercalcemia and suspected multiple myeloma resolved =with IVF's    . Lytic bone lesions on xray   . Acute subdural hematoma 03/2012     recent fall in hospital stay,resolved+  . Normocytic anemia     due to chemo and multiple myeloma,no active bleding  . Arthritis     spine  . Atrial fibrillation   . Radiation 05/09/12-05/18/12    20 gray in 8 Fx's right hip palliative  . Status post chemotherapy     velcade and revlimid  . Drug-induced osteonecrosis of jaw   . Multiple myeloma 03/2012  . Cancer     mul;tiple myeloma, bone lesions r 7th rib,   . Lymphadenopathy, axillary 03/08/2014    Past Surgical History  Procedure Laterality Date  . Breast lumpectomy    . 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 Tonsillectomy      age 42    Family History  Problem Relation Age of Onset  . Heart disease Mother   . Hyperlipidemia Sister   . Hypertension Sister   . Diabetes Sister   . Diabetes Sister   . Hyperlipidemia Sister   . Hypertension Sister   . Sudden death Neg Hx   . Heart attack Neg Hx     Social History History  Substance Use Topics  . Smoking status: Never  Smoker   . Smokeless tobacco: Never Used  . Alcohol Use: No    Allergies  Allergen Reactions  . Simvastatin Other (See Comments)    Unsure, Joint aches listed previously     Current Outpatient Prescriptions  Medication Sig Dispense Refill  . acetaminophen (TYLENOL) 500 MG tablet Take 500-1,000 mg by mouth every 4 (four) hours as needed (Pain). No more than 6 tabs in one day.      . ALPRAZolam (XANAX) 1 MG tablet TAKE ONE-HALF - ONE TABLET EVERY DAY AS NEEDED FOR ANXIETY  30 tablet  0  . aspirin EC 81 MG tablet Take 81 mg by mouth daily.      . Calcium Carbonate-Vitamin D (CALCIUM 500 + D PO) Take 1 tablet by mouth 2 (two) times daily.      . Cholecalciferol (VITAMIN D-3) 5000 UNITS TABS Take 1 tablet by mouth daily.       Marland Kitchen ibuprofen (ADVIL,MOTRIN) 200 MG tablet Take 200 mg by mouth every 6 (six) hours as needed.      Marland Kitchen lenalidomide (REVLIMID) 5 MG capsule Take 1 capsule (5 mg total) by mouth daily.  21 capsule  0  . lidocaine-prilocaine (EMLA) cream Apply 1 application topically as needed.  30 g  0  . losartan (COZAAR) 50 MG tablet Take 50 mg by mouth every morning.      . Multiple Vitamin (MULTIVITAMIN WITH MINERALS) TABS Take 1 tablet by mouth daily.      . ondansetron (ZOFRAN) 4 MG tablet Take 4 mg by mouth every 4 (four) hours as needed for nausea.       . ondansetron (ZOFRAN) 8 MG tablet Take 1 tablet (8 mg total) by mouth every 8 (eight) hours as needed for nausea or vomiting.  30 tablet  1  . oxyCODONE (ROXICODONE) 5 MG immediate release tablet Take 1 tablet (5 mg total) by mouth every 4 (four) hours as needed for severe pain.  30 tablet  0  . potassium chloride SA (K-DUR,KLOR-CON) 20 MEQ tablet TAKE ONE TABLET EVERY DAY  60 tablet  0  . prochlorperazine (COMPAZINE) 10 MG tablet Take 10 mg by mouth every 6 (six) hours as needed for nausea.       . prochlorperazine (COMPAZINE) 10 MG tablet Take 1 tablet (10 mg total) by mouth every 6 (six) hours as needed (Nausea or vomiting).  30  tablet  1  . rosuvastatin (CRESTOR) 10 MG tablet Take 10 mg by mouth daily.      . traMADol (ULTRAM) 50 MG tablet Take 1 tablet (50 mg total) by mouth every 8 (eight) hours as needed for pain.  60 tablet  3  . traZODone (DESYREL) 50 MG tablet Take 1 tablet (50 mg total) by mouth at bedtime as needed for sleep.  90 tablet  3   No current facility-administered medications for this visit.    Review of Systems Review of Systems  Constitutional: Positive for fatigue.  HENT: Negative.   Eyes: Negative.   Respiratory: Positive for chest tightness and shortness of breath.   Cardiovascular: Negative.   Gastrointestinal: Negative.   Endocrine: Negative.   Genitourinary: Negative.   Hematological: Positive for adenopathy. Bruises/bleeds easily.    Blood pressure 150/85, pulse 70, temperature 96.7 F (35.9 C), resp. rate 16, height $RemoveBe'5\' 6"'kMWpTLczr$  (1.676 m), weight 153 lb 3.2 oz (69.491 kg).  Physical Exam Physical Exam  Constitutional: She appears well-developed and well-nourished.  HENT:  Head: Normocephalic.  Mouth/Throat: No oropharyngeal exudate.  Eyes: Pupils are equal, round, and reactive to light. No scleral icterus.  Neck: Normal range of motion. Neck supple.  Cardiovascular: Normal rate.   Pulmonary/Chest: She exhibits mass, bony tenderness and deformity.    Lymphadenopathy:    She has cervical adenopathy.    She has axillary adenopathy.       Right axillary: Lateral adenopathy present.  Skin: Skin is warm and dry.    Data Reviewed Oncology notes  Chest CT 02/2014  Assessment    Right axillary lymphadenopathy  Multiple myeloma  Patient Active Problem List   Diagnosis Date Noted  . Lymphadenopathy, axillary 03/08/2014  . Thrombocytopenia, unspecified 02/22/2014  . 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 Rib pain 02/22/2014  . Anxiety   . Hypertension   . Hyperlipidemia   . Depression   . Osteopenia   . Acute renal failure   . Lytic bone lesions on xray   .  Normocytic anemia   . Arthritis   . History of atrial fibrillation   . Radiation   . Status post chemotherapy   . Embolism and thrombosis of unspecified site 07/12/2012  . Hypokalemia 05/24/2012  . New onset atrial fibrillation 05/03/2012  . SDH (subdural hematoma) 04/13/2012  . Multiple  myeloma 04/08/2012  . Constipation due to slow transit/hypercalcemia 04/08/2012  . Hypercalcemia 04/04/2012  . Anemia 04/04/2012  . Acute subdural hematoma 03/28/2012  . Obesity 03/03/2012  . BACK PAIN, LUMBAR 11/05/2009  . OSTEOPENIA 11/05/2008  . POSTMENOPAUSAL STATUS 11/05/2008  . DEPRESSION 04/17/2008  . HYPERLIPIDEMIA NEC/NOS 10/19/2006  . ANXIETY 10/19/2006  . HYPERTENSION 10/19/2006  . INSOMNIA 10/19/2006  . HYPERGLYCEMIA 10/19/2006    CLINICAL DATA: Multiple myeloma. Right rib pain. Ongoing  chemotherapy.  EXAM:  CT CHEST WITHOUT CONTRAST  TECHNIQUE:  Multidetector CT imaging of the chest was performed following the  standard protocol without IV contrast.  COMPARISON: 04/06/2012  FINDINGS:  Large right lateral chest wall mass with destruction of the right  lateral seventh rib shows significant increase in size, currently  measuring 6.6 x 9.0 cm compared with 4.4 x 5.7 cm on prior exam.  There is new adjacent lymphadenopathy in the right lateral chest  wall soft tissues and right axilla. Largest index lymph node in the  right axilla measures 1.7 cm on image 22. New lymphadenopathy is  also seen in the right cardiophrenic angle, measuring 1.8 cm on  image 46. Small right pleural effusion is increased in size since  previous study.  No other lytic bone lesions or chest wall mass identified. Mild  compressive atelectasis is seen in the right lower lung. No evidence  of pulmonary infiltrate or mass.  No evidence of adrenal mass. Cholelithiasis incidentally noted.  IMPRESSION:  Increased size of large right lateral chest wall soft tissue mass  with destruction of the left lateral  seventh rib.  New lymphadenopathy in the right lateral chest wall, right axilla,  and right cardiophrenic angle.  Increased size of small right pleural effusion.  Cholelithiasis incidentally noted.  Electronically Signed  By: Earle Gell M.D.  On: 03/08/2014 10:41     Plan    Right axillary lymph node biopsy.The procedure has been discussed with the patient.  Alternative therapies have been discussed with the patient.  Operative risks include bleeding,  Infection,  Organ injury,  Nerve injury,  Blood vessel injury,  DVT,  Pulmonary embolism,  Death,  And possible reoperation.  Medical management risks include worsening of present situation.  The success of the procedure is 50 -90 % at treating patients symptoms.  The patient understands and agrees to proceed.       Jailon Schaible A. 03/26/2014, 2:42 PM

## 2014-04-12 ENCOUNTER — Encounter (HOSPITAL_BASED_OUTPATIENT_CLINIC_OR_DEPARTMENT_OTHER): Payer: Self-pay | Admitting: Surgery

## 2014-04-15 ENCOUNTER — Telehealth: Payer: Self-pay | Admitting: Hematology and Oncology

## 2014-04-15 NOTE — Telephone Encounter (Signed)
s.w. pt and advised on July appt....pt was already aware

## 2014-04-16 ENCOUNTER — Other Ambulatory Visit: Payer: Self-pay | Admitting: Family Medicine

## 2014-04-16 ENCOUNTER — Telehealth: Payer: Self-pay | Admitting: Hematology and Oncology

## 2014-04-16 NOTE — Telephone Encounter (Signed)
Spk w/pt confirming ov per 07/15 POF pt confirmed...Marland KitchenMarland KitchenKJ

## 2014-04-18 ENCOUNTER — Ambulatory Visit (HOSPITAL_BASED_OUTPATIENT_CLINIC_OR_DEPARTMENT_OTHER): Payer: Medicare Other

## 2014-04-18 ENCOUNTER — Telehealth: Payer: Self-pay | Admitting: *Deleted

## 2014-04-18 ENCOUNTER — Ambulatory Visit (HOSPITAL_BASED_OUTPATIENT_CLINIC_OR_DEPARTMENT_OTHER): Payer: Medicare Other | Admitting: Hematology and Oncology

## 2014-04-18 ENCOUNTER — Other Ambulatory Visit (HOSPITAL_COMMUNITY)
Admission: RE | Admit: 2014-04-18 | Discharge: 2014-04-18 | Disposition: A | Discharge status: Home / Self Care | Payer: Medicare Other | Source: Ambulatory Visit | Attending: Hematology and Oncology | Admitting: Hematology and Oncology

## 2014-04-18 ENCOUNTER — Other Ambulatory Visit: Payer: Medicare Other

## 2014-04-18 ENCOUNTER — Encounter: Payer: Self-pay | Admitting: Hematology and Oncology

## 2014-04-18 ENCOUNTER — Other Ambulatory Visit: Payer: Self-pay | Admitting: Physician Assistant

## 2014-04-18 ENCOUNTER — Telehealth: Payer: Self-pay | Admitting: Hematology and Oncology

## 2014-04-18 VITALS — BP 148/86 | HR 118 | Temp 97.5°F | Resp 19 | Ht 66.0 in | Wt 147.7 lb

## 2014-04-18 DIAGNOSIS — C8300 Small cell B-cell lymphoma, unspecified site: Secondary | ICD-10-CM

## 2014-04-18 DIAGNOSIS — C8589 Other specified types of non-Hodgkin lymphoma, extranodal and solid organ sites: Secondary | ICD-10-CM

## 2014-04-18 DIAGNOSIS — M87180 Osteonecrosis due to drugs, jaw: Secondary | ICD-10-CM

## 2014-04-18 DIAGNOSIS — C9000 Multiple myeloma not having achieved remission: Secondary | ICD-10-CM | POA: Insufficient documentation

## 2014-04-18 DIAGNOSIS — M8708 Idiopathic aseptic necrosis of bone, other site: Secondary | ICD-10-CM

## 2014-04-18 DIAGNOSIS — Z95828 Presence of other vascular implants and grafts: Secondary | ICD-10-CM

## 2014-04-18 DIAGNOSIS — G4700 Insomnia, unspecified: Secondary | ICD-10-CM

## 2014-04-18 DIAGNOSIS — D649 Anemia, unspecified: Secondary | ICD-10-CM

## 2014-04-18 LAB — LACTATE DEHYDROGENASE (CC13): LDH: 322 U/L — AB (ref 125–245)

## 2014-04-18 LAB — CBC WITH DIFFERENTIAL/PLATELET
BASO%: 1 % (ref 0.0–2.0)
Basophils Absolute: 0 10*3/uL (ref 0.0–0.1)
EOS%: 5.4 % (ref 0.0–7.0)
Eosinophils Absolute: 0.2 10*3/uL (ref 0.0–0.5)
HEMATOCRIT: 34.7 % — AB (ref 34.8–46.6)
HGB: 11.2 g/dL — ABNORMAL LOW (ref 11.6–15.9)
LYMPH#: 1.3 10*3/uL (ref 0.9–3.3)
LYMPH%: 28.2 % (ref 14.0–49.7)
MCH: 30.6 pg (ref 25.1–34.0)
MCHC: 32.4 g/dL (ref 31.5–36.0)
MCV: 94.4 fL (ref 79.5–101.0)
MONO#: 0.5 10*3/uL (ref 0.1–0.9)
MONO%: 10.1 % (ref 0.0–14.0)
NEUT#: 2.5 10*3/uL (ref 1.5–6.5)
NEUT%: 55.3 % (ref 38.4–76.8)
Platelets: 237 10*3/uL (ref 145–400)
RBC: 3.68 10*6/uL — ABNORMAL LOW (ref 3.70–5.45)
RDW: 15.6 % — ABNORMAL HIGH (ref 11.2–14.5)
WBC: 4.6 10*3/uL (ref 3.9–10.3)

## 2014-04-18 LAB — COMPREHENSIVE METABOLIC PANEL (CC13)
ALT: 16 U/L (ref 0–55)
AST: 18 U/L (ref 5–34)
Albumin: 3.3 g/dL — ABNORMAL LOW (ref 3.5–5.0)
Alkaline Phosphatase: 52 U/L (ref 40–150)
BUN: 19.1 mg/dL (ref 7.0–26.0)
CALCIUM: 11.1 mg/dL — AB (ref 8.4–10.4)
CHLORIDE: 99 meq/L (ref 98–109)
CO2: 21 meq/L — AB (ref 22–29)
CREATININE: 1 mg/dL (ref 0.6–1.1)
Glucose: 87 mg/dl (ref 70–140)
Potassium: 4.4 mEq/L (ref 3.5–5.1)
Sodium: 142 mEq/L (ref 136–145)
Total Bilirubin: 0.63 mg/dL (ref 0.20–1.20)
Total Protein: 6.4 g/dL (ref 6.4–8.3)

## 2014-04-18 LAB — URIC ACID (CC13): URIC ACID, SERUM: 8 mg/dL — AB (ref 2.6–7.4)

## 2014-04-18 MED ORDER — HEPARIN SOD (PORK) LOCK FLUSH 100 UNIT/ML IV SOLN
500.0000 [IU] | Freq: Once | INTRAVENOUS | Status: AC
  Administered 2014-04-18: 500 [IU] via INTRAVENOUS
  Filled 2014-04-18: qty 5

## 2014-04-18 MED ORDER — ALLOPURINOL 300 MG PO TABS: 300.0000 mg | ORAL_TABLET | Freq: Every day | ORAL | Status: DC

## 2014-04-18 MED ORDER — OXYCODONE-ACETAMINOPHEN 5-325 MG PO TABS
ORAL_TABLET | ORAL | Status: AC
  Filled 2014-04-18: qty 1

## 2014-04-18 MED ORDER — SODIUM CHLORIDE 0.9 % IJ SOLN
10.0000 mL | INTRAMUSCULAR | Status: DC | PRN
  Administered 2014-04-18: 10 mL via INTRAVENOUS
  Filled 2014-04-18: qty 10

## 2014-04-18 MED ORDER — OXYCODONE-ACETAMINOPHEN 5-325 MG PO TABS
1.0000 | ORAL_TABLET | Freq: Once | ORAL | Status: AC
  Administered 2014-04-18: 1 via ORAL

## 2014-04-18 MED ORDER — PREDNISONE 50 MG PO TABS: 50.0000 mg | ORAL_TABLET | Freq: Every day | ORAL | Status: DC

## 2014-04-18 NOTE — Telephone Encounter (Signed)
Spoke with Shawn in Admitting, and requested bed reservation for pt to be admitted on Mon. 04/23/14 as per Dr. Calton Dach instructions.

## 2014-04-18 NOTE — Patient Instructions (Signed)

## 2014-04-18 NOTE — Assessment & Plan Note (Signed)
IV pamidronate is contraindicated.

## 2014-04-18 NOTE — Progress Notes (Signed)
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    04/08/2012 Bone Marrow Biopsy BM biopsy showed 92% plasma cell, loss chromosome 8 and 13, addition of chromosome 8p, 14q and Myeloma FISH showed 13q and extra chromosome 14   04/12/2012 - 10/20/2012 Chemotherapy Velcade 3 weeks on 1 week off, Revlimid 15 mg PO days 1-21 days, dexamethasone 40 mg weekly.    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   01/30/2014 Procedure She has placement of Infuse-a-Port   02/01/2014 - 03/01/2014 Chemotherapy Due to progression of disease, Velcade was added back. Revlimid doses reduced to 5 mg, 21 days on 7 days off. Treatment was discontinued due to new palpable lymphadenopathy.   03/08/2014 Imaging Increased size of large right lateral chest wall soft tissue mass with destruction of the left lateral seventh rib. New lymphadenopathy in the right lateral chest wall, right axilla, and right cardiophrenic angle.     04/11/2014 Surgery She underwent excisional lymph node  biopsy.   04/11/2014 Pathology Accession: [id number] showed a plasma blastic lymphoma.    INTERVAL HISTORY: Please see below for problem oriented charting. She returns today to review test results of her recent biopsy. She has mild pain in her back and her ribs. She has poor appetite. She denies new lymphadenopathy.  REVIEW OF SYSTEMS:   All other systems were reviewed with the patient and are negative.  I have reviewed the past medical history, past surgical history, social history and family history with the patient and they are unchanged from previous note.  ALLERGIES:  is allergic to simvastatin.  MEDICATIONS:  Current Outpatient Prescriptions  Medication Sig Dispense Refill  . acetaminophen (TYLENOL) 500 MG tablet Take 500-1,000 mg by mouth every 4 (four) hours as needed (Pain). No more than 6 tabs in one day.      . ALPRAZolam (XANAX) 1 MG tablet Take 1 tablet (1 mg total) by mouth at bedtime as needed for anxiety.  30 tablet  0  . Calcium Carbonate-Vitamin D (CALCIUM 500 + D PO) Take 1 tablet by mouth 2 (two) times daily.      . Cholecalciferol (VITAMIN D-3) 5000 UNITS TABS Take 1 tablet by mouth daily.       Marland Kitchen ibuprofen (ADVIL,MOTRIN) 200 MG tablet Take 200 mg by mouth every 6 (six) hours as needed.      Marland Kitchen losartan (COZAAR) 50 MG tablet TAKE 1 TABLET DAILY.  30 tablet  5  . Multiple Vitamin (MULTIVITAMIN WITH MINERALS) TABS Take 1 tablet by mouth daily.      Marland Kitchen  ondansetron (ZOFRAN) 4 MG tablet Take 4 mg by mouth every 4 (four) hours as needed for nausea.       Marland Kitchen oxyCODONE (ROXICODONE) 5 MG immediate release tablet Take 1 tablet (5 mg total) by mouth every 4 (four) hours as needed for severe pain.  30 tablet  0  . oxyCODONE-acetaminophen (ROXICET) 5-325 MG per tablet Take 1-2 tablets by mouth every 4 (four) hours as needed.  30 tablet  0  . potassium chloride SA (K-DUR,KLOR-CON) 20 MEQ tablet TAKE ONE TABLET EVERY DAY  60 tablet  0  . prochlorperazine (COMPAZINE) 10 MG tablet  Take 10 mg by mouth every 6 (six) hours as needed for nausea.       . rosuvastatin (CRESTOR) 10 MG tablet Take 10 mg by mouth daily.      . traMADol (ULTRAM) 50 MG tablet Take 1 tablet (50 mg total) by mouth every 8 (eight) hours as needed for pain.  60 tablet  3  . traZODone (DESYREL) 50 MG tablet Take 1 tablet (50 mg total) by mouth at bedtime as needed for sleep.  90 tablet  3  . allopurinol (ZYLOPRIM) 300 MG tablet Take 1 tablet (300 mg total) by mouth daily.  30 tablet  0  . aspirin EC 81 MG tablet Take 81 mg by mouth daily.      Marland Kitchen lenalidomide (REVLIMID) 5 MG capsule Take 1 capsule (5 mg total) by mouth daily.  21 capsule  0  . lidocaine-prilocaine (EMLA) cream Apply 1 application topically as needed.  30 g  0  . predniSONE (DELTASONE) 50 MG tablet Take 1 tablet (50 mg total) by mouth daily with breakfast.  10 tablet  0   No current facility-administered medications for this visit.    PHYSICAL EXAMINATION: ECOG PERFORMANCE STATUS: 1 - Symptomatic but completely ambulatory  Filed Vitals:   04/18/14 1206  BP: 148/86  Pulse: 118  Temp: 97.5 F (36.4 C)  Resp: 19   Filed Weights   04/18/14 1206  Weight: 147 lb 11.2 oz (66.996 kg)    GENERAL:alert, no distress and comfortable. She looks thin but not cachectic SKIN: skin color, texture, turgor are normal, no rashes or significant lesions EYES: normal, Conjunctiva are pink and non-injected, sclera clear OROPHARYNX:no exudate, no erythema and lips, buccal mucosa, and tongue normal  NECK: supple, thyroid normal size, non-tender, without nodularity LYMPH:  Well healed surgical scar on the right axilla. No new lymphadenopathy LUNGS: clear to auscultation and percussion with normal breathing effort HEART: regular rate & rhythm and no murmurs and no lower extremity edema ABDOMEN:abdomen soft, non-tender and normal bowel sounds Musculoskeletal:no cyanosis of digits and no clubbing  NEURO: alert & oriented x 3 with fluent speech, no  focal motor/sensory deficits  LABORATORY DATA:  I have reviewed the data as listed    Component Value Date/Time   NA 142 04/18/2014 1328   NA 140 04/09/2014 1230   K 4.4 04/18/2014 1328   K 5.0 04/09/2014 1230   CL 96 04/09/2014 1230   CL 105 02/23/2013 1334   CO2 21* 04/18/2014 1328   CO2 22 04/09/2014 1230   GLUCOSE 87 04/18/2014 1328   GLUCOSE 92 04/09/2014 1230   GLUCOSE 83 02/23/2013 1334   GLUCOSE 96 09/30/2006 1322   BUN 19.1 04/18/2014 1328   BUN 16 04/09/2014 1230   CREATININE 1.0 04/18/2014 1328   CREATININE 0.93 04/09/2014 1230   CREATININE 1.16* 07/10/2013 1001   CALCIUM 11.1* 04/18/2014 1328  CALCIUM 10.9* 04/09/2014 1230   CALCIUM 11.8* 04/04/2012 0420   PROT 6.4 04/18/2014 1328   PROT 7.1 04/09/2014 1230   ALBUMIN 3.3* 04/18/2014 1328   ALBUMIN 3.5 04/09/2014 1230   AST 18 04/18/2014 1328   AST 22 04/09/2014 1230   ALT 16 04/18/2014 1328   ALT 17 04/09/2014 1230   ALKPHOS 52 04/18/2014 1328   ALKPHOS 59 04/09/2014 1230   BILITOT 0.63 04/18/2014 1328   BILITOT 0.6 04/09/2014 1230   GFRNONAA 58* 04/09/2014 1230   GFRNONAA 46* 07/10/2013 1001   GFRAA 67* 04/09/2014 1230   GFRAA 53* 07/10/2013 1001    No results found for this basename: SPEP, UPEP,  kappa and lambda light chains    Lab Results  Component Value Date   WBC 4.6 04/18/2014   NEUTROABS 2.5 04/18/2014   HGB 11.2* 04/18/2014   HCT 34.7* 04/18/2014   MCV 94.4 04/18/2014   PLT 237 04/18/2014      Chemistry      Component Value Date/Time   NA 142 04/18/2014 1328   NA 140 04/09/2014 1230   K 4.4 04/18/2014 1328   K 5.0 04/09/2014 1230   CL 96 04/09/2014 1230   CL 105 02/23/2013 1334   CO2 21* 04/18/2014 1328   CO2 22 04/09/2014 1230   BUN 19.1 04/18/2014 1328   BUN 16 04/09/2014 1230   CREATININE 1.0 04/18/2014 1328   CREATININE 0.93 04/09/2014 1230   CREATININE 1.16* 07/10/2013 1001      Component Value Date/Time   CALCIUM 11.1* 04/18/2014 1328   CALCIUM 10.9* 04/09/2014 1230   CALCIUM 11.8* 04/04/2012 0420   ALKPHOS 52  04/18/2014 1328   ALKPHOS 59 04/09/2014 1230   AST 18 04/18/2014 1328   AST 22 04/09/2014 1230   ALT 16 04/18/2014 1328   ALT 17 04/09/2014 1230   BILITOT 0.63 04/18/2014 1328   BILITOT 0.6 04/09/2014 1230      ASSESSMENT & PLAN:  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 My original plan is to admit her early next week. Unfortunately, at the time of dictation, the serum calcium is high with elevated uric acid and LDH. She is at risk of kidney failure, spontaneous tumor lysis syndrome and worsening malignant hypercalcemia. I do not think she will make it to next week. I contacted the patient with plan for admission tomorrow. I will start her on high-dose Solu-Medrol 125 mg intravenous daily, one dose of IV Rasburicase 3 mg X 1, hold her calcium with vitamin D supplement, and start her on 100 cc of IV fluids continuous infusion.  She has history of osteonecrosis of the jaw; IV pamidronate is contraindicated. I will stage her with CT scan of the chest, abdomen and pelvis with IV contrast for and will order urgent echocardiogram to evaluate baseline ejection fraction. On Friday, I plan to start her on infusional chemotherapy with [facility]. We discussed the role of chemotherapy. The intent is for cure.  We discussed some of the risks, benefits and side-effects of Etoposide,Cytoxan, Adriamycin,Vincristine and Solumedrol/Prednisone.   Some of the short term side-effects included, though not limited to, risk of fatigue, weight loss, tumor lysis syndrome, risk of allergic reactions, pancytopenia, life-threatening infections, need for transfusions of blood products, nausea, vomiting, change in bowel habits, hair loss, risk of congestive heart failure, admission to hospital for various reasons, and risks of death.   Long term side-effects are also discussed including permanent damage to nerve function, chronic fatigue,  and rare secondary malignancy including bone  marrow disorders.   The patient is aware that the response rates discussed earlier is not guaranteed.    After a long discussion, patient made an informed decision to proceed with the prescribed plan of care and went ahead to sign the consent form today.   Patient education material was dispensed. I have discussed with the patient the poor prognosis associated with the aggressive lymphoma. Without chemotherapy, prognosis is less than 6 months. I also offered her a second opinion but the patient wants to proceed with chemotherapy here as soon as possible.    Multiple myeloma Her treatment is placed on hold due to newly diagnosed aggressive lymphoma.   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  Normocytic anemia This is likely anemia of chronic disease. The patient denies recent history of bleeding such as epistaxis, hematuria or hematochezia. She is asymptomatic from the anemia. We will observe for now.  She does not require transfusion now.    Hypercalcemia As outlined above, she will be treated aggressively while in the hospital with aggressive IV fluid resuscitation, intravenous Solu-Medrol, and to be started on chemotherapy as soon as possible.   Orders Placed This Encounter  Procedures  . CT Chest W Contrast    Standing Status: Future     Number of Occurrences:      Standing Expiration Date: 06/18/2015    Order Specific Question:  Reason for Exam (SYMPTOM  OR DIAGNOSIS REQUIRED)    Answer:  staging lymphoma    Order Specific Question:  Preferred imaging location?    Answer:  [facility]  . CT Abdomen Pelvis W Contrast    Standing Status: Future     Number of Occurrences:      Standing Expiration Date: 07/19/2015    Order Specific Question:  Reason for Exam (SYMPTOM  OR DIAGNOSIS REQUIRED)    Answer:  staging lymphoma    Order Specific Question:  Preferred imaging location?    Answer:  [facility]  . Lactate dehydrogenase     Standing Status: Future     Number of Occurrences: 1     Standing Expiration Date: 04/18/2015  . Uric Acid    Standing Status: Future     Number of Occurrences: 1     Standing Expiration Date: 04/18/2015  . Flow Cytometry    lymphoma    Standing Status: Future     Number of Occurrences: 1     Standing Expiration Date: 04/18/2015  . HIV antibody (with reflex)    Standing Status: Future     Number of Occurrences: 1     Standing Expiration Date: 04/19/2015  . SPEP & IFE with QIG    Standing Status: Future     Number of Occurrences: 1     Standing Expiration Date: 04/18/2015  . Kappa/lambda light chains    Standing Status: Future     Number of Occurrences: 1     Standing Expiration Date: 04/18/2015  . 2D Echocardiogram without contrast    Standing Status: Future     Number of Occurrences:      Standing Expiration Date: 04/18/2015    Order Specific Question:  Type of Echo    Answer:  Limited    Order Specific Question:  Reason for Exam    Answer:  Evaluate EF    Order Specific Question:  Where should this test be performed    Answer:  [facility]  All questions were answered. The patient knows to call the clinic with any problems, questions or concerns. No barriers to learning was detected. I spent 60 minutes counseling the patient face to face. The total time spent in the appointment was 80 minutes and more than 50% was on counseling and review of test results     [facility], Kendra Poplaski, MD 04/18/2014 8:56 PM

## 2014-04-18 NOTE — Telephone Encounter (Signed)
gv and printed appt sched adn avs for pt for July adn Aug....gv pt bariu....pt to have tx in hospital

## 2014-04-18 NOTE — Assessment & Plan Note (Signed)
Her cancer is very aggressive and causing malignant hypercalcemia. My original plan is to admit her early next week. Unfortunately, at the time of dictation, the serum calcium is high with elevated uric acid and LDH. She is at risk of kidney failure, spontaneous tumor lysis syndrome and worsening malignant hypercalcemia. I do not think she will make it to next week. I contacted the patient with plan for admission tomorrow. I will start her on high-dose Solu-Medrol 125 mg intravenous daily, one dose of IV Rasburicase 3 mg X 1, hold her calcium with vitamin D supplement, and start her on 100 cc of IV fluids continuous infusion.  She has history of osteonecrosis of the jaw; IV pamidronate is contraindicated. I will stage her with CT scan of the chest, abdomen and pelvis with IV contrast for and will order urgent echocardiogram to evaluate baseline ejection fraction. On Friday, I plan to start her on infusional chemotherapy with [facility]. We discussed the role of chemotherapy. The intent is for cure.  We discussed some of the risks, benefits and side-effects of Etoposide,Cytoxan, Adriamycin,Vincristine and Solumedrol/Prednisone.   Some of the short term side-effects included, though not limited to, risk of fatigue, weight loss, tumor lysis syndrome, risk of allergic reactions, pancytopenia, life-threatening infections, need for transfusions of blood products, nausea, vomiting, change in bowel habits, hair loss, risk of congestive heart failure, admission to hospital for various reasons, and risks of death.   Long term side-effects are also discussed including permanent damage to nerve function, chronic fatigue, and rare secondary malignancy including bone marrow disorders.   The patient is aware that the response rates discussed earlier is not guaranteed.    After a long discussion, patient made an informed decision to proceed with the prescribed plan of care and went ahead to sign the consent form  today.   Patient education material was dispensed. I have discussed with the patient the poor prognosis associated with the aggressive lymphoma. Without chemotherapy, prognosis is less than 6 months. I also offered her a second opinion but the patient wants to proceed with chemotherapy here as soon as possible.

## 2014-04-18 NOTE — Assessment & Plan Note (Signed)
As outlined above, she will be treated aggressively while in the hospital with aggressive IV fluid resuscitation, intravenous Solu-Medrol, and to be started on chemotherapy as soon as possible.

## 2014-04-18 NOTE — Assessment & Plan Note (Signed)
This is likely anemia of chronic disease. The patient denies recent history of bleeding such as epistaxis, hematuria or hematochezia. She is asymptomatic from the anemia. We will observe for now.  She does not require transfusion now.   

## 2014-04-18 NOTE — Assessment & Plan Note (Signed)
Her treatment is placed on hold due to newly diagnosed aggressive lymphoma.

## 2014-04-19 ENCOUNTER — Inpatient Hospital Stay (HOSPITAL_COMMUNITY)
Admission: AD | Admit: 2014-04-19 | Discharge: 2014-04-24 | DRG: 847 | Disposition: A | Discharge status: Home / Self Care | Payer: Medicare Other | Source: Ambulatory Visit | Attending: Hematology and Oncology | Admitting: Hematology and Oncology

## 2014-04-19 ENCOUNTER — Other Ambulatory Visit: Payer: Self-pay | Admitting: Hematology and Oncology

## 2014-04-19 ENCOUNTER — Encounter (INDEPENDENT_AMBULATORY_CARE_PROVIDER_SITE_OTHER): Payer: Medicare Other | Admitting: Surgery

## 2014-04-19 ENCOUNTER — Inpatient Hospital Stay (HOSPITAL_COMMUNITY): Payer: Medicare Other

## 2014-04-19 ENCOUNTER — Other Ambulatory Visit: Payer: Self-pay | Admitting: *Deleted

## 2014-04-19 ENCOUNTER — Encounter (HOSPITAL_COMMUNITY): Payer: Self-pay

## 2014-04-19 DIAGNOSIS — Z833 Family history of diabetes mellitus: Secondary | ICD-10-CM | POA: Diagnosis not present

## 2014-04-19 DIAGNOSIS — F329 Major depressive disorder, single episode, unspecified: Secondary | ICD-10-CM | POA: Diagnosis present

## 2014-04-19 DIAGNOSIS — C9000 Multiple myeloma not having achieved remission: Secondary | ICD-10-CM | POA: Diagnosis present

## 2014-04-19 DIAGNOSIS — D649 Anemia, unspecified: Secondary | ICD-10-CM

## 2014-04-19 DIAGNOSIS — I4891 Unspecified atrial fibrillation: Secondary | ICD-10-CM | POA: Diagnosis present

## 2014-04-19 DIAGNOSIS — M899 Disorder of bone, unspecified: Secondary | ICD-10-CM | POA: Diagnosis present

## 2014-04-19 DIAGNOSIS — C8300 Small cell B-cell lymphoma, unspecified site: Secondary | ICD-10-CM

## 2014-04-19 DIAGNOSIS — C801 Malignant (primary) neoplasm, unspecified: Secondary | ICD-10-CM | POA: Diagnosis present

## 2014-04-19 DIAGNOSIS — Z79899 Other long term (current) drug therapy: Secondary | ICD-10-CM

## 2014-04-19 DIAGNOSIS — J90 Pleural effusion, not elsewhere classified: Secondary | ICD-10-CM | POA: Diagnosis present

## 2014-04-19 DIAGNOSIS — F3289 Other specified depressive episodes: Secondary | ICD-10-CM | POA: Diagnosis present

## 2014-04-19 DIAGNOSIS — M8708 Idiopathic aseptic necrosis of bone, other site: Secondary | ICD-10-CM | POA: Diagnosis present

## 2014-04-19 DIAGNOSIS — E785 Hyperlipidemia, unspecified: Secondary | ICD-10-CM | POA: Diagnosis present

## 2014-04-19 DIAGNOSIS — K5900 Constipation, unspecified: Secondary | ICD-10-CM

## 2014-04-19 DIAGNOSIS — Z9181 History of falling: Secondary | ICD-10-CM | POA: Diagnosis not present

## 2014-04-19 DIAGNOSIS — C8590 Non-Hodgkin lymphoma, unspecified, unspecified site: Secondary | ICD-10-CM

## 2014-04-19 DIAGNOSIS — C8588 Other specified types of non-Hodgkin lymphoma, lymph nodes of multiple sites: Secondary | ICD-10-CM

## 2014-04-19 DIAGNOSIS — I10 Essential (primary) hypertension: Secondary | ICD-10-CM | POA: Diagnosis present

## 2014-04-19 DIAGNOSIS — Z5111 Encounter for antineoplastic chemotherapy: Secondary | ICD-10-CM | POA: Diagnosis present

## 2014-04-19 DIAGNOSIS — Z7982 Long term (current) use of aspirin: Secondary | ICD-10-CM | POA: Diagnosis not present

## 2014-04-19 DIAGNOSIS — C8589 Other specified types of non-Hodgkin lymphoma, extranodal and solid organ sites: Secondary | ICD-10-CM | POA: Diagnosis present

## 2014-04-19 DIAGNOSIS — D638 Anemia in other chronic diseases classified elsewhere: Secondary | ICD-10-CM | POA: Diagnosis present

## 2014-04-19 DIAGNOSIS — M129 Arthropathy, unspecified: Secondary | ICD-10-CM | POA: Diagnosis present

## 2014-04-19 DIAGNOSIS — M949 Disorder of cartilage, unspecified: Secondary | ICD-10-CM

## 2014-04-19 DIAGNOSIS — Z8249 Family history of ischemic heart disease and other diseases of the circulatory system: Secondary | ICD-10-CM | POA: Diagnosis not present

## 2014-04-19 DIAGNOSIS — F411 Generalized anxiety disorder: Secondary | ICD-10-CM | POA: Diagnosis present

## 2014-04-19 DIAGNOSIS — Z791 Long term (current) use of non-steroidal anti-inflammatories (NSAID): Secondary | ICD-10-CM | POA: Diagnosis not present

## 2014-04-19 DIAGNOSIS — Z888 Allergy status to other drugs, medicaments and biological substances status: Secondary | ICD-10-CM | POA: Diagnosis not present

## 2014-04-19 DIAGNOSIS — I059 Rheumatic mitral valve disease, unspecified: Secondary | ICD-10-CM

## 2014-04-19 LAB — KAPPA/LAMBDA LIGHT CHAINS
KAPPA FREE LGHT CHN: 6.6 mg/dL — AB (ref 0.33–1.94)
Kappa:Lambda Ratio: 28.7 — ABNORMAL HIGH (ref 0.26–1.65)
Lambda Free Lght Chn: 0.23 mg/dL — ABNORMAL LOW (ref 0.57–2.63)

## 2014-04-19 MED ORDER — TRAMADOL HCL 50 MG PO TABS
50.0000 mg | ORAL_TABLET | Freq: Three times a day (TID) | ORAL | Status: DC | PRN
  Administered 2014-04-21 – 2014-04-22 (×4): 50 mg via ORAL
  Filled 2014-04-19 (×4): qty 1

## 2014-04-19 MED ORDER — ALUM & MAG HYDROXIDE-SIMETH 200-200-20 MG/5ML PO SUSP: 60.0000 mL | ORAL | Status: DC | PRN

## 2014-04-19 MED ORDER — LOSARTAN POTASSIUM 50 MG PO TABS
50.0000 mg | ORAL_TABLET | Freq: Every day | ORAL | Status: DC
  Administered 2014-04-19 – 2014-04-24 (×6): 50 mg via ORAL
  Filled 2014-04-19 (×6): qty 1

## 2014-04-19 MED ORDER — ENOXAPARIN SODIUM 40 MG/0.4ML ~~LOC~~ SOLN
40.0000 mg | SUBCUTANEOUS | Status: DC
  Administered 2014-04-19 – 2014-04-24 (×6): 40 mg via SUBCUTANEOUS
  Filled 2014-04-19 (×6): qty 0.4

## 2014-04-19 MED ORDER — TRAZODONE HCL 50 MG PO TABS
50.0000 mg | ORAL_TABLET | Freq: Every evening | ORAL | Status: DC | PRN
  Administered 2014-04-19 – 2014-04-24 (×2): 50 mg via ORAL
  Filled 2014-04-19 (×2): qty 1

## 2014-04-19 MED ORDER — IOHEXOL 300 MG/ML  SOLN
25.0000 mL | INTRAMUSCULAR | Status: AC
  Administered 2014-04-19: 25 mL via ORAL

## 2014-04-19 MED ORDER — SODIUM CHLORIDE 0.9 % IV SOLN
3.0000 mg | Freq: Once | INTRAVENOUS | Status: AC
  Administered 2014-04-19: 3 mg via INTRAVENOUS
  Filled 2014-04-19: qty 2

## 2014-04-19 MED ORDER — OXYCODONE HCL 5 MG PO TABS
10.0000 mg | ORAL_TABLET | ORAL | Status: DC | PRN
  Administered 2014-04-19 – 2014-04-24 (×14): 10 mg via ORAL
  Filled 2014-04-19 (×14): qty 2

## 2014-04-19 MED ORDER — LIDOCAINE-PRILOCAINE 2.5-2.5 % EX CREA
TOPICAL_CREAM | Freq: Once | CUTANEOUS | Status: AC
  Administered 2014-04-19: 1 via TOPICAL
  Filled 2014-04-19: qty 5

## 2014-04-19 MED ORDER — ALPRAZOLAM 1 MG PO TABS
1.0000 mg | ORAL_TABLET | Freq: Every evening | ORAL | Status: DC | PRN
  Administered 2014-04-21 – 2014-04-23 (×4): 1 mg via ORAL
  Filled 2014-04-19 (×6): qty 1

## 2014-04-19 MED ORDER — ASPIRIN EC 81 MG PO TBEC
81.0000 mg | DELAYED_RELEASE_TABLET | Freq: Every day | ORAL | Status: DC
  Administered 2014-04-19 – 2014-04-24 (×6): 81 mg via ORAL
  Filled 2014-04-19 (×6): qty 1

## 2014-04-19 MED ORDER — SODIUM CHLORIDE 0.9 % IV SOLN
INTRAVENOUS | Status: DC
  Administered 2014-04-19: via INTRAVENOUS
  Administered 2014-04-19: 1000 mL via INTRAVENOUS
  Administered 2014-04-21: 14:00:00 via INTRAVENOUS
  Administered 2014-04-21: 1000 mL via INTRAVENOUS
  Administered 2014-04-22 – 2014-04-23 (×2): via INTRAVENOUS

## 2014-04-19 MED ORDER — ACETAMINOPHEN 325 MG PO TABS
650.0000 mg | ORAL_TABLET | ORAL | Status: DC | PRN
  Filled 2014-04-19: qty 2

## 2014-04-19 MED ORDER — SENNOSIDES-DOCUSATE SODIUM 8.6-50 MG PO TABS
1.0000 | ORAL_TABLET | Freq: Two times a day (BID) | ORAL | Status: DC
  Administered 2014-04-19 – 2014-04-24 (×10): 1 via ORAL
  Filled 2014-04-19 (×12): qty 1

## 2014-04-19 MED ORDER — ALLOPURINOL 300 MG PO TABS
300.0000 mg | ORAL_TABLET | Freq: Every day | ORAL | Status: DC
  Administered 2014-04-20 – 2014-04-24 (×5): 300 mg via ORAL
  Filled 2014-04-19 (×5): qty 1

## 2014-04-19 MED ORDER — IOHEXOL 300 MG/ML  SOLN
100.0000 mL | Freq: Once | INTRAMUSCULAR | Status: AC | PRN
  Administered 2014-04-19: 100 mL via INTRAVENOUS

## 2014-04-19 MED ORDER — ONDANSETRON HCL 4 MG PO TABS: 4.0000 mg | ORAL_TABLET | Freq: Three times a day (TID) | ORAL | Status: DC | PRN

## 2014-04-19 MED ORDER — METHYLPREDNISOLONE SODIUM SUCC 125 MG IJ SOLR
125.0000 mg | Freq: Every day | INTRAMUSCULAR | Status: DC
  Administered 2014-04-19: 125 mg via INTRAVENOUS
  Filled 2014-04-19 (×2): qty 2

## 2014-04-19 MED ORDER — ACETAMINOPHEN 500 MG PO TABS: 1000.0000 mg | ORAL_TABLET | ORAL | Status: DC | PRN

## 2014-04-19 MED ORDER — SODIUM CHLORIDE 0.9 % IV SOLN
INTRAVENOUS | Status: AC
  Administered 2014-04-19: 1000 mL via INTRAVENOUS

## 2014-04-19 MED ORDER — ONDANSETRON 8 MG/NS 50 ML IVPB
8.0000 mg | Freq: Three times a day (TID) | INTRAVENOUS | Status: DC | PRN
  Filled 2014-04-19: qty 8

## 2014-04-19 MED ORDER — ONDANSETRON 8 MG PO TBDP: 4.0000 mg | ORAL_TABLET | Freq: Three times a day (TID) | ORAL | Status: DC | PRN

## 2014-04-19 MED ORDER — ONDANSETRON HCL 4 MG/2ML IJ SOLN: 4.0000 mg | Freq: Three times a day (TID) | INTRAMUSCULAR | Status: DC | PRN

## 2014-04-19 NOTE — H&P (Signed)
[place]  [phone]    ADMISSION NOTE  Admitting MD: Heath Lark, MD  Attending MD:  Heath Lark, MD  I have seen the patient, examined her and edited the notes as follows  Reason for admission: [name] is an 78 y.o. female being directly admitted to [facility] for chemotherapy for the treatment of [place] with [facility].  In addition, she carries a history of with history of IgA kappa multiple myeloma, treatment on hold due to newly diagnosed aggressive lymphoma.   HPI: [name] was seen at the office of the [place] on 7/22 to review the results of her recent right axillary node biopsy on 7/15, confirming plasmablastic lymphoma. Other than mild back and rib pain and decreased appetite, she denied any other symptoms, such as fevers, chills, night sweats, nausea, vomiting, diarrhea or constipation. She had no confusion, headaches or vision changes. She complained of poor memory. She denied any myalgias. No new lymphadenopathy was noted by the patient. She denied any chest pain or palpitations.   Labs were abnormal. Unfortunately the serum calcium is high at 11.1, with elevated uric acid at 8.0 and LDH at 322. She is at risk of kidney failure, spontaneous tumor lysis syndrome and worsening malignant hypercalcemia.Treatment is urgent. After evaluation, plans for direct admission on 7/23 for chemo on 7/24 were made. She has history of osteonecrosis of the jaw; IV pamidronate is contraindicated. CT of the chest, abdomen and pelvis and 2 D echo are pending.    SUMMARY OF ONCOLOGIC HISTORY:  Oncology History    Myeloma staging:  ISS stage II (albumin 2.4, B2M 4.8)  Durie-Salmon Stage III (hypercalcemia, bone lesions, renal failure)     Multiple myeloma    04/04/2012  Tumor Marker  M Spike 4gm/dL, IgA 4020 mg/dL, serum kappa 2.11, urine test was positive for Bence Jones proteinemia.    04/08/2012  Bone Marrow Biopsy  BM biopsy  showed 92% plasma cell, loss chromosome 8 and 13, addition of chromosome 8p, 14q and Myeloma FISH showed 13q and extra chromosome 14    04/12/2012 - 10/20/2012  Chemotherapy  Velcade 3 weeks on 1 week off, Revlimid 15 mg PO days 1-21 days, dexamethasone 40 mg weekly.    05/09/2012 - 05/18/2012  Radiation Therapy  She received palliative RT to hips 20Gy in 8 fractions    10/31/2012 - 07/20/2013  Chemotherapy  she remained on maintenance Revlimid only 21 days on 7 days off at 15 mg daily    11/01/2012  Bone Marrow Biopsy  Repeat BM biopsy confirmed remission with only 2% plasma cells    06/12/2013  Adverse Reaction  She was found to have osteonecrosis of the jaw, Zometa was discontinued    07/20/2013  Adverse Reaction  Dose of Revlimid was reduced to 10 mg days 1-21 every 28 days due to pancytopenia.    01/30/2014  Procedure  She has placement of Infuse-a-Port    02/01/2014 - 03/01/2014  Chemotherapy  Due to progression of disease, Velcade was added back. Revlimid doses reduced to 5 mg, 21 days on 7 days off. Treatment was discontinued due to new palpable lymphadenopathy.    03/08/2014  Imaging  Increased size of large right lateral chest wall soft tissue mass with destruction of the left lateral seventh rib. New lymphadenopathy in the right lateral chest wall, right axilla, and right cardiophrenic angle.    04/11/2014  Surgery  She underwent excisional lymph node biopsy.  04/11/2014  Pathology  Accession: [id number] showed a plasma blastic lymphoma.      PMH:  Past Medical History  Diagnosis Date  . Anxiety   . Hypertension   . Hyperlipidemia   . Depression   . Osteopenia   . Acute renal failure      due to hypercalcemia and suspected multiple myeloma resolved =with IVF's    . Lytic bone lesions on xray   . Acute subdural hematoma 03/2012     recent fall in hospital stay,resolved+  . Arthritis     spine  . Atrial fibrillation   . Radiation 05/09/12-05/18/12    20 gray in 8 Fx's right hip palliative  .  Status post chemotherapy     velcade and revlimid  . Drug-induced osteonecrosis of jaw   . Multiple myeloma 03/2012  . Cancer     mul;tiple myeloma, bone lesions r 7th rib,   . Lymphadenopathy, axillary 03/08/2014  . Normocytic anemia     due to chemo and multiple myeloma,no active bleding    Surgeries:  Past Surgical History  Procedure Laterality Date  . 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 Tonsillectomy      age 73  . Breast lumpectomy  1962    lt bx-neg  . Port a cath revision      put in 6/15  . Axillary lymph node biopsy Right 04/11/2014    Procedure: RIGHT AXILLARY LYMPH NODE BIOPSY;  Surgeon: Joyice Faster. Cornett, MD;  Location: [place];  Service: General;  Laterality: Right;    Allergies:  Allergies  Allergen Reactions  . Simvastatin Other (See Comments)    Unsure, Joint aches listed previously     Medications:   Prior to Admission:  Prescriptions prior to admission  Medication Sig Dispense Refill  . acetaminophen (TYLENOL) 500 MG tablet Take 1,000 mg by mouth every 4 (four) hours as needed (Pain). No more than 6 tabs in one day.      . allopurinol (ZYLOPRIM) 300 MG tablet Take 1 tablet (300 mg total) by mouth daily.  30 tablet  0  . ALPRAZolam (XANAX) 1 MG tablet Take 1 tablet (1 mg total) by mouth at bedtime as needed for anxiety.  30 tablet  0  . aspirin EC 81 MG tablet Take 81 mg by mouth daily.      . Calcium Carbonate-Vitamin D (CALCIUM 500 + D PO) Take 1 tablet by mouth 2 (two) times daily.      . Cholecalciferol (VITAMIN D-3) 5000 UNITS TABS Take 5,000 Units by mouth daily.       Marland Kitchen ibuprofen (ADVIL,MOTRIN) 200 MG tablet Take 400-600 mg by mouth every 6 (six) hours as needed for moderate pain.       Marland Kitchen lidocaine-prilocaine (EMLA) cream Apply 1 application topically daily as needed. Port      . losartan (COZAAR) 50 MG tablet Take 50 mg by mouth  daily.      . Multiple Vitamin (MULTIVITAMIN WITH MINERALS) TABS Take 1 tablet by mouth daily.      . ondansetron (ZOFRAN) 4 MG tablet Take 4 mg by mouth every 4 (four) hours as needed for nausea.       Marland Kitchen oxyCODONE (ROXICODONE) 5 MG immediate release tablet Take 1 tablet (5 mg total) by mouth every 4 (four) hours as needed for severe pain.  30 tablet  0  . oxyCODONE-acetaminophen (PERCOCET/ROXICET) 5-325  MG per tablet Take 1 tablet by mouth every 4 (four) hours as needed for moderate pain or severe pain.      . potassium chloride SA (K-DUR,KLOR-CON) 20 MEQ tablet Take 20 mEq by mouth daily.      . rosuvastatin (CRESTOR) 10 MG tablet Take 10 mg by mouth daily.      Marland Kitchen tetrahydrozoline 0.05 % ophthalmic solution Place 1 drop into both eyes 2 (two) times daily as needed (redness/dry eyes).      . traMADol (ULTRAM) 50 MG tablet Take 1 tablet (50 mg total) by mouth every 8 (eight) hours as needed for pain.  60 tablet  3  . traZODone (DESYREL) 50 MG tablet Take 1 tablet (50 mg total) by mouth at bedtime as needed for sleep.  90 tablet  3  . predniSONE (DELTASONE) 50 MG tablet Take 1 tablet (50 mg total) by mouth daily with breakfast.  10 tablet  0  . prochlorperazine (COMPAZINE) 10 MG tablet Take 10 mg by mouth every 6 (six) hours as needed for nausea.        Scheduled Meds: . enoxaparin (LOVENOX) injection  40 mg Subcutaneous Q24H  . iohexol  25 mL Oral Q1 Hr x 2  . lidocaine-prilocaine   Topical Once  . methylPREDNISolone (SOLU-MEDROL) injection  125 mg Intravenous Daily  . rasburicase (ELITEK) IV infusion  3 mg Intravenous Once  . senna-docusate  1 tablet Oral BID   Continuous Infusions: . sodium chloride     PRN Meds:.acetaminophen, alum & mag hydroxide-simeth, ondansetron (ZOFRAN) IV, ondansetron (ZOFRAN) IV, ondansetron, ondansetron, oxyCODONE PRN:  LAG:TXMIWO of Systems  Constitutional: Positive for weight loss due to decreased appetite over the last week, unknown amount. Negative for  fever, chills or night sweats. She is somewhat fatigued.  Eyes: Denies blurred vision and double vision. Denies watery eyes. Marland Kitchen Respiratory:Denies cough, hemoptysis or shortness of breath.  Cardiovascular: Denies chest pain or palpitations. Denies lower extremity edema.  GI: Denies nausea, vomiting, diarrhea, constipation. Denies any changes in bowel caliber. Denies melena or hematochezia.   GU: Denies blood in urine, hesitancy or loss of urinary control. Skin: Denies rash, petechia or bruising Neurological:Denies headaches, motor or sensory deficits. Review of all other organs systems are otherwise negative  Family History:  Family History  Problem Relation Age of Onset  . Heart disease Mother   . Hyperlipidemia Sister   . Hypertension Sister   . Diabetes Sister   . Diabetes Sister   . Hyperlipidemia Sister   . Hypertension Sister   . Sudden death Neg Hx   . Heart attack Neg Hx     Social History:  reports that she has never smoked. She has never used smokeless tobacco. She reports that she does not drink alcohol or use illicit drugs.  Physical Exam:   Filed Vitals:   04/19/14 0839  BP: 132/81  Pulse: 106  Temp: 98.1 F (36.7 C)  Resp: 20   ECOG: 1 Symptomatic but completely ambulatory  GENERAL:alert, no distress and comfortable. She looks thin but not cachectic  SKIN: skin color, texture, turgor are normal, no rashes or significant lesions  EYES: normal, Conjunctiva are pink and non-injected, sclera clear  OROPHARYNX:no exudate, no erythema and lips, buccal mucosa, and tongue normal  NECK: supple, thyroid normal size, non-tender, without nodularity  LYMPH: Well healed surgical scar on the right axilla. No new lymphadenopathy  LUNGS: clear to auscultation and percussion with normal breathing effort  HEART: regular rate & rhythm,no murmurs  and no lower extremity edema  ABDOMEN: abdomen soft, non-tender and normal bowel sounds  Musculoskeletal:no cyanosis of digits and no  clubbing  NEURO: alert & oriented x 3 with fluent speech, no focal motor/sensory deficits   LABS: CBC   Recent Labs Lab 04/18/14 1329  WBC 4.6  HGB 11.2*  HCT 34.7*  PLT 237  MCV 94.4  MCH 30.6  MCHC 32.4  RDW 15.6*  LYMPHSABS 1.3  MONOABS 0.5  EOSABS 0.2  BASOSABS 0.0     CMP    Recent Labs Lab 04/18/14 1328  NA 142  K 4.4  CO2 21*  GLUCOSE 87  BUN 19.1  CREATININE 1.0  CALCIUM 11.1*  AST 18  ALT 16  ALKPHOS 52  BILITOT 0.63        Component Value Date/Time   BILITOT 0.63 04/18/2014 1328   BILITOT 0.6 04/09/2014 1230   BILIDIR 0.1 04/10/2008 2020   IBILI 0.1 04/10/2008 2020       Imaging Studies: No results found.   Patient: JEMA, DEEGAN Collected: 04/11/2014 Client: [facility] Accession: [id number] Received: 04/11/2014 Erroll Luna, MD DOB: October 13, 1935 Age: 40 Gender: F Reported: 04/16/2014 [address] Patient Ph: [phone] MRN #: [id number] [place], [place] [address] Visit #: [id number] Chart #: Phone: [phone] Fax: CC: FLOW CYTOMETRY REPORT INTERPRETATION Interpretation Tissue-Flow Cytometry - MONOCLONAL B-CELL POPULATION WITH PLASMACYTIC DIFFERENTIATION. Susanne Greenhouse MD Pathologist, Electronic Signature (Case signed 04/16/2014) GROSS AND MICROSCOPIC INFORMATION Source Tissue-Flow Cytometry Microscopic Gated population: Flow cytometric immunophenotyping is performed using antibiodies to the antigens listed in the table below. Electronic gates are placed around a cell cluster displaying light scatter properties corresponding to lymphocytes/plasma cells.- Abnormal Cells in gated population: 74 %- Phenotype of Abnormal Cells: CD19, HLA-DR, CD38, Kappa Lymphoid Associated Myeloid Associated Misc. As CD2 neg CD19 pos CD11c ND CD45 ND CD3 neg CD20 neg CD13 ND HLA-DR pos CD4 neg CD21 neg CD14 ND CD10 neg CD5 neg CD22 neg CD15 ND CD56/16 ND CD7 neg CD23 neg CD33 ND ZAP70 ND CD8 neg CD103 ND MPO ND CD34 ND CD25 ND  FMC7 ND CD117 ND CD52 ND sKappa pos CD38 pos sLambda neg 7AAD tested cKappa pos cLambda neg Gross ref. [phone]   A/P: 78 y.o. female    1. Plasmablastic lymphoma  Her cancer is very aggressive and is causing malignant hypercalcemia.  The original plan was to admit her early next week.  Unfortunately the serum calcium is high with elevated uric acid and LDH.  She is at risk of kidney failure, spontaneous tumor lysis syndrome and worsening malignant hypercalcemia.Treatment is urgent.  Will start her on high-dose Solu-Medrol 125 mg intravenous daily, one dose of IV Rasburicase 3 mg X 1, hold her calcium with vitamin D supplement, and start her on 1 liter IVF bolus followed by 100 cc of IV fluids continuous infusion.  She has history of osteonecrosis of the jaw; IV pamidronate is contraindicated.  Will stage her with CT scan of the chest, abdomen and pelvis with IV contrast upon admission, and will order urgent echocardiogram to evaluate baseline ejection fraction.  On Friday, plan to start her on infusional chemotherapy with [facility].  The role of chemotherapy has been discussed. The intent is for cure.  Risks, benefits and side-effects of Etoposide,Cytoxan, Adriamycin,Vincristine and Solumedrol/Prednisone were discussed.  Some of the short term side-effects included, though not limited to, risk of fatigue, weight loss, tumor lysis syndrome, risk of allergic reactions, pancytopenia, life-threatening infections, need for transfusions of  blood products, nausea, vomiting, change in bowel habits, hair loss, risk of congestive heart failure, admission to hospital for various reasons, and risks of death.  Long term side-effects are also discussed including permanent damage to nerve function, chronic fatigue, and rare secondary malignancy including bone marrow disorders.  The patient is aware that the response rates discussed earlier is not guaranteed.  After a long discussion, patient made an  informed decision to proceed with the prescribed plan of care and went ahead to sign the consent form on 7/22. Patient education material was dispensed.  We discussed with the patient the poor prognosis associated with the aggressive lymphoma.  Without chemotherapy, prognosis is less than 6 months.  We offered a second opinion but the patient wants to proceed with chemotherapy here as soon as possible.   2. Multiple myeloma  Her treatment is placed on hold due to newly diagnosed aggressive lymphoma.   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   4. Normocytic anemia  This is likely anemia of chronic disease. The patient denies recent history of bleeding such as epistaxis, hematuria or hematochezia. She is asymptomatic from the anemia. We will observe for now. She does not require transfusion now.   5. Hypercalcemia  As outlined above, she will be treated aggressively while in the hospital with aggressive IV fluid resuscitation, intravenous Solu-Medrol, and to be started on chemotherapy as soon as possible  6. DVT prophylaxis On Lovenox  7. Full Code  Sharene Butters E 04/19/2014 9:36 AM Anora Schwenke, MD 04/19/2014

## 2014-04-19 NOTE — Plan of Care (Signed)
Problem: Phase I Progression Outcomes Goal: OOB as tolerated unless otherwise ordered Outcome: Progressing Patient nedds one person assist to transfer from bed to wheelchair and when going to BR.

## 2014-04-19 NOTE — Progress Notes (Signed)
  Echocardiogram 2D Echocardiogram has been performed.  [name] 04/19/2014, 11:02 AM

## 2014-04-20 ENCOUNTER — Ambulatory Visit (HOSPITAL_COMMUNITY): Payer: Medicare Other

## 2014-04-20 ENCOUNTER — Other Ambulatory Visit: Payer: Medicare Other

## 2014-04-20 DIAGNOSIS — R0789 Other chest pain: Secondary | ICD-10-CM

## 2014-04-20 LAB — COMPREHENSIVE METABOLIC PANEL
ALT: 10 U/L (ref 0–35)
ANION GAP: 19 — AB (ref 5–15)
AST: 14 U/L (ref 0–37)
Albumin: 3 g/dL — ABNORMAL LOW (ref 3.5–5.2)
Alkaline Phosphatase: 45 U/L (ref 39–117)
BILIRUBIN TOTAL: 0.2 mg/dL — AB (ref 0.3–1.2)
BUN: 17 mg/dL (ref 6–23)
CHLORIDE: 99 meq/L (ref 96–112)
CO2: 21 mEq/L (ref 19–32)
CREATININE: 0.95 mg/dL (ref 0.50–1.10)
Calcium: 9.6 mg/dL (ref 8.4–10.5)
GFR calc Af Amer: 66 mL/min — ABNORMAL LOW (ref >90)
GFR calc non Af Amer: 57 mL/min — ABNORMAL LOW (ref >90)
Glucose, Bld: 104 mg/dL — ABNORMAL HIGH (ref 70–99)
Potassium: 4.6 mEq/L (ref 3.7–5.3)
Sodium: 139 mEq/L (ref 137–147)
TOTAL PROTEIN: 6.1 g/dL (ref 6.0–8.3)

## 2014-04-20 LAB — SPEP & IFE WITH QIG
ALBUMIN ELP: 50.3 % — AB (ref 55.8–66.1)
ALPHA-1-GLOBULIN: 5.8 % — AB (ref 2.9–4.9)
ALPHA-2-GLOBULIN: 13.5 % — AB (ref 7.1–11.8)
BETA GLOBULIN: 19.2 % — AB (ref 4.7–7.2)
Beta 2: 3.2 % (ref 3.2–6.5)
Gamma Globulin: 8 % — ABNORMAL LOW (ref 11.1–18.8)
IgA: 590 mg/dL — ABNORMAL HIGH (ref 69–380)
IgG (Immunoglobin G), Serum: 533 mg/dL — ABNORMAL LOW (ref 690–1700)
IgM, Serum: 6 mg/dL — ABNORMAL LOW (ref 52–322)
M-Spike, %: 0.83 g/dL
Total Protein, Serum Electrophoresis: 6.5 g/dL (ref 6.0–8.3)

## 2014-04-20 LAB — HIV ANTIBODY (ROUTINE TESTING W REFLEX): HIV 1&2 Ab, 4th Generation: NONREACTIVE

## 2014-04-20 LAB — URIC ACID: URIC ACID, SERUM: 0.8 mg/dL — AB (ref 2.4–7.0)

## 2014-04-20 LAB — LACTATE DEHYDROGENASE: LDH: 289 U/L — ABNORMAL HIGH (ref 94–250)

## 2014-04-20 LAB — FLOW CYTOMETRY

## 2014-04-20 MED ORDER — SODIUM CHLORIDE 0.9 % IV SOLN
INTRAVENOUS | Status: DC
  Administered 2014-04-20: 10:00:00 via INTRAVENOUS

## 2014-04-20 MED ORDER — ALTEPLASE 2 MG IJ SOLR: 2.0000 mg | Freq: Once | INTRAMUSCULAR | Status: AC | PRN

## 2014-04-20 MED ORDER — HEPARIN SOD (PORK) LOCK FLUSH 100 UNIT/ML IV SOLN: 500.0000 [IU] | Freq: Once | INTRAVENOUS | Status: AC | PRN

## 2014-04-20 MED ORDER — ONDANSETRON HCL 4 MG/2ML IJ SOLN
Freq: Once | INTRAMUSCULAR | Status: AC
  Administered 2014-04-24: 16 mg via INTRAVENOUS
  Filled 2014-04-20: qty 8

## 2014-04-20 MED ORDER — COLD PACK MISC ONCOLOGY
1.0000 | Freq: Once | Status: AC | PRN
  Filled 2014-04-20: qty 1

## 2014-04-20 MED ORDER — PROCHLORPERAZINE MALEATE 10 MG PO TABS: 10.0000 mg | ORAL_TABLET | Freq: Four times a day (QID) | ORAL | Status: DC | PRN

## 2014-04-20 MED ORDER — SODIUM CHLORIDE 0.9 % IJ SOLN
3.0000 mL | INTRAMUSCULAR | Status: DC | PRN
Start: 2014-04-20 — End: 2014-04-24

## 2014-04-20 MED ORDER — METHYLPREDNISOLONE SODIUM SUCC 125 MG IJ SOLR
125.0000 mg | INTRAMUSCULAR | Status: DC
  Administered 2014-04-20 – 2014-04-22 (×3): 125 mg via INTRAVENOUS
  Filled 2014-04-20 (×2): qty 2

## 2014-04-20 MED ORDER — SODIUM CHLORIDE 0.9 % IJ SOLN: 10.0000 mL | INTRAMUSCULAR | Status: DC | PRN

## 2014-04-20 MED ORDER — VINCRISTINE SULFATE CHEMO INJECTION 1 MG/ML
INTRAVENOUS | Status: AC
  Administered 2014-04-20 – 2014-04-23 (×4): via INTRAVENOUS
  Filled 2014-04-20 (×6): qty 9

## 2014-04-20 MED ORDER — MAGNESIUM HYDROXIDE 400 MG/5ML PO SUSP
30.0000 mL | Freq: Every day | ORAL | Status: DC
Start: 2014-04-20 — End: 2014-04-24
  Administered 2014-04-20 – 2014-04-22 (×3): 30 mL via ORAL
  Filled 2014-04-20 (×3): qty 30

## 2014-04-20 MED ORDER — SODIUM CHLORIDE 0.9 % IV SOLN
750.0000 mg/m2 | Freq: Once | INTRAVENOUS | Status: AC
  Administered 2014-04-24: 1320 mg via INTRAVENOUS
  Filled 2014-04-20: qty 66

## 2014-04-20 MED ORDER — HEPARIN SOD (PORK) LOCK FLUSH 100 UNIT/ML IV SOLN: 250.0000 [IU] | Freq: Once | INTRAVENOUS | Status: AC | PRN

## 2014-04-20 MED ORDER — ONDANSETRON 8 MG/NS 50 ML IVPB
8.0000 mg | INTRAVENOUS | Status: AC
  Administered 2014-04-20 – 2014-04-23 (×4): 8 mg via INTRAVENOUS
  Filled 2014-04-20 (×5): qty 8

## 2014-04-20 MED ORDER — HOT PACK MISC ONCOLOGY
1.0000 | Freq: Once | Status: AC | PRN
  Filled 2014-04-20: qty 1

## 2014-04-20 MED ORDER — ONDANSETRON HCL 8 MG PO TABS: 8.0000 mg | ORAL_TABLET | Freq: Two times a day (BID) | ORAL | Status: DC | PRN

## 2014-04-20 NOTE — Progress Notes (Signed)
[name]   DOB:1936/04/21   OF#:[id number]   DPO#:[id number] I have seen the patient, examined her and edited the notes as follows  Subjective: Resting comfortably in chair. Denies shortness of breath or chest pain. Main complaint is right posterior chest wall pain at the area of rib and tumor involvement. Pain is intermittent, worse with movement. Denies nausea or vomiting. Last bowel movement on 7/23. Denies urinary complaints. Denies headache or vision changes. Denies odynophagia or dysphagia. Denies numbness or tingling.  Appetite is good. Ambulating without difficulty. Eager to start chemo today.   Scheduled Meds: . allopurinol  300 mg Oral Daily  . aspirin EC  81 mg Oral Daily  . enoxaparin (LOVENOX) injection  40 mg Subcutaneous Q24H  . losartan  50 mg Oral Daily  . methylPREDNISolone (SOLU-MEDROL) injection  125 mg Intravenous Daily  . senna-docusate  1 tablet Oral BID   Continuous Infusions: . sodium chloride 100 mL/hr at 04/19/14 2340   PRN Meds:.acetaminophen, acetaminophen, ALPRAZolam, alum & mag hydroxide-simeth, ondansetron (ZOFRAN) IV, ondansetron (ZOFRAN) IV, ondansetron, ondansetron, oxyCODONE, traMADol, traZODone   Objective:  Filed Vitals:   04/20/14 0502  BP: 143/76  Pulse: 105  Temp: 98.1 F (36.7 C)  Resp: 18      Intake/Output Summary (Last 24 hours) at 04/20/14 0754 Last data filed at 04/20/14 0600  Gross per 24 hour  Intake   2410 ml  Output      0 ml  Net   2410 ml    ECOG PERFORMANCE STATUS:  1 Symptomatic but completely ambulatory (Restricted in physically strenuous activity but ambulatory and able to carry out work of a light or sedentary nature. For example, light housework, office work)    GENERAL:alert, no distress and comfortable. She looks thin but not cachectic  SKIN: skin color, texture, turgor are normal, no rashes or significant lesions. A few areas of echymmoses on the dorsal aspect of her upper extremities. EYES: normal,  conjunctiva are pink and non-injected, sclera clear  OROPHARYNX:no exudate, no erythema and lips, buccal mucosa, and tongue normal  NECK: supple, thyroid normal size, non-tender, without nodularity  LYMPH: Well healed surgical scar on the right axilla. No new lymphadenopathy  LUNGS:decreased breath sounds at the right base, otherwise clear with normal breathing effort. HEART: regular rate & rhythm, no murmurs and no lower extremity edema  ABDOMEN: abdomen soft, non-tender and normal bowel sounds  Musculoskeletal:no cyanosis of digits and no clubbing  NEURO: alert & oriented x 3 with fluent speech, no focal motor/sensory deficits Musculoskeletal: Area of tenderness to palpation on the right lower chest wall consistent with area of malignancy. No breast masses.     CBG (last 3)  No results found for this basename: GLUCAP,  in the last 72 hours   Labs:   Recent Labs Lab 04/18/14 1329  WBC 4.6  HGB 11.2*  HCT 34.7*  PLT 237  MCV 94.4  MCH 30.6  MCHC 32.4  RDW 15.6*  LYMPHSABS 1.3  MONOABS 0.5  EOSABS 0.2  BASOSABS 0.0     Chemistries:    Recent Labs Lab 04/18/14 1328 04/20/14 0450  NA 142 139  K 4.4 4.6  CL  --  99  CO2 21* 21  GLUCOSE 87 104*  BUN 19.1 17  CREATININE 1.0 0.95  CALCIUM 11.1* 9.6  AST 18 14  ALT 16 10  ALKPHOS 52 45  BILITOT 0.63 0.2*    GFR Estimated Creatinine Clearance: 47.2 ml/min (by C-G formula based on  Cr of 0.95).  Liver Function Tests:  Recent Labs Lab 04/18/14 1328 04/20/14 0450  AST 18 14  ALT 16 10  ALKPHOS 52 45  BILITOT 0.63 0.2*  PROT 6.4 6.1  ALBUMIN 3.3* 3.0*      Imaging Studies: I reviewed the imaging myself Ct Chest W Contrast  04/19/2014   COMPARISON:  Chest CT on 03/08/2014  FINDINGS: CT CHEST FINDINGS  Significant increase in size of right lateral chest wall mass is seen since prior study, which currently measures 8.9 x 14.0 cm on image 37 compared to 6.6 x 9.0 cm previously. Destruction of the right  lateral seventh rib is again demonstrated.  New mass is now seen in the right anterior chest wall soft tissues which measures 2.7 x 7.9 cm on image 44. Increased lymphadenopathy is seen in the right axilla and right cardiophrenic angle. New 1.3 cm anterior mediastinal lymph node is also seen anterior to the ascending aorta on image number 24. In addition, a 12 mm soft tissue nodule is seen in the subcutaneous tissues of the posterior chest wall distal right of midline on image 38, consistent with tumor.  Moderate to large right pleural effusion has increased in size, with increased compressive atelectasis. Left lung remains clear.   IMPRESSION: Increased size of large right lateral chest wall mass, and new masses involving the right anterior chest wall and posterior chest wall subcutaneous tissues.  Increased mediastinal lymphadenopathy and right axillary lymphadenopathy.  Increased moderate to large right pleural effusion and right lung compressive atelectasis.  Small soft tissue nodule in the posterior abdominal wall subcutaneous tissues, consistent tumor.  Multiple lytic bone lesions, consistent with osseous involvement. Those seen on prior chest CT show no significant interval change.   Electronically Signed   By: Myles Rosenthal M.D.   On: 04/19/2014 11:11   Ct Abdomen W Contrast  04/19/2014  COMPARISON:  Chest CT on 03/08/2014  FINDINGS: CT CHEST FINDINGS  Significant increase in size of right lateral chest wall mass is seen since prior study, which currently measures 8.9 x 14.0 cm on image 37 compared to 6.6 x 9.0 cm previously. Destruction of the right lateral seventh rib is again demonstrated.  New mass is now seen in the right anterior chest wall soft tissues which measures 2.7 x 7.9 cm on image 44. Increased lymphadenopathy is seen in the right axilla and right cardiophrenic angle. New 1.3 cm anterior mediastinal lymph node is also seen anterior to the ascending aorta on image number 24. In addition, a 12  mm soft tissue nodule is seen in the subcutaneous tissues of the posterior chest wall distal right of midline on image 38, consistent with tumor.  Moderate to large right pleural effusion has increased in size, with increased compressive atelectasis. Left lung remains clear.  CT ABDOMEN FINDINGS  Spleen is normal in size and appearance. The liver, pancreas, adrenal glands, and kidneys are also unremarkable except for a few tiny left renal cysts. No evidence of hydronephrosis. No evidence of lymphadenopathy within the abdomen or pelvis. Uterus and adnexal regions are unremarkable.  Cholelithiasis is again demonstrated, however there is no evidence of cholecystitis or biliary dilatation. No other inflammatory process or abnormal fluid collections are identified. Normal appendix visualized. No evidence of bowel wall thickening or dilatation.  Small lytic bone lesions involving the left scapula and thoracic spine are stable. Similar small lytic bone lesions are seen in the pelvis. Old lower thoracic and lumbar vertebral body compression fractures noted. A  1.5 cm soft tissue nodule is seen in the subcutaneous tissues of the posterior abdominal wall just to the right of midline, also suspicious for tumor.  IMPRESSION: Increased size of large right lateral chest wall mass, and new masses involving the right anterior chest wall and posterior chest wall subcutaneous tissues.  Increased mediastinal lymphadenopathy and right axillary lymphadenopathy.  Increased moderate to large right pleural effusion and right lung compressive atelectasis.  Small soft tissue nodule in the posterior abdominal wall subcutaneous tissues, consistent tumor.  Multiple lytic bone lesions, consistent with osseous involvement. Those seen on prior chest CT show no significant interval change.   Electronically Signed   By: Earle Gell M.D.   On: 04/19/2014 11:11     Assessment/Plan: 78 y.o.  1. Plasmablastic lymphoma  Her cancer is very  aggressive and is causing malignant hypercalcemia.  The original plan was to admit her early next week.  Unfortunately the serum calcium is high with elevated uric acid and LDH.  Due to risk of kidney failure, spontaneous tumor lysis syndrome and worsening malignant hypercalcemia, the treatment was urgent. She was started on high-dose Solu-Medrol 125 mg intravenous daily, one dose of IV Rasburicase 3 mg X 1,Calcium with vitamin D supplement were held, and 1 liter IVF bolus followed by 100 cc of IV fluids continuous infusion were given with good response.  She has history of osteonecrosis of the jaw; IV pamidronate is contraindicated.   CT scan of the chest, abdomen and pelvis with IV contrast on 7/23 reveals increased size of large right lateral chest wall mass, and new masses involving the right anterior chest wall and posterior chest wall subcutaneous tissues.  Increased mediastinal lymphadenopathy and right axillary lymphadenopathy. Increased moderate to large right pleural effusion and right lung compressive atelectasis. Small soft tissue nodule in the posterior abdominal wall subcutaneous tissues, consistent tumor. Multiple lytic bone lesions, consistent with osseous involvement. Echocardiogram on 7/23 shows ejection fraction of 55% to 60%  On 7/24, patient to receive infusional chemotherapy with EPOCH. Consent form was signed on 7/22 The role of chemotherapy has been discussed. The intent is for cure.  Risks, benefits and side-effects of Etoposide,Cytoxan, Adriamycin,Vincristine and Solumedrol/Prednisone were discussed. Patient wishes to proceed.  2. Multiple myeloma  Her treatment is placed on hold due to newly diagnosed aggressive lymphoma.   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   4. Normocytic anemia  This is likely anemia of chronic disease. The patient denies recent history of bleeding such as epistaxis, hematuria or hematochezia. She is asymptomatic  from the anemia. We will observe for now. She does not require transfusion now. Check counts on 7/25  5. Hypercalcemia  Aggressive treatment was initiated on 7/23 as mentioned above, with IV fluid resuscitation, intravenous Solu-Medrol. Calcium today normalized at 9.6. Continue to monitor.   6. DVT prophylaxis  On Lovenox   7. 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   8. History of Atrial Fibrillation Rate controlled.  Continue Aspirin therapy   9. Right chest wall pain Secondary to tumor presence and increased moderate to  large right pleural effusion  Pain controlled with current regimen including Oxycodone and Tramadol with bowel regimen.  Follow up chest x ray to be performed  if shortness of breath is present. If effusion continues to increase, she will need to have thoracentesis.  10. Full Code     **Disclaimer: This note was dictated with  voice recognition software. Similar sounding words can inadvertently be transcribed and this note may contain transcription errors which may not have been corrected upon publication of note.Sharene Butters E, PA-C 04/20/2014  7:54 AM Saanvi Hakala, MD 04/20/2014

## 2014-04-20 NOTE — Progress Notes (Signed)
[id number] Rosana Hoes RN,BSN,CCM: [phone] Chart reviewed for patient status and needs. No discharge needs present at time of review. Next review due on [id number].

## 2014-04-21 DIAGNOSIS — K5900 Constipation, unspecified: Secondary | ICD-10-CM

## 2014-04-21 LAB — COMPREHENSIVE METABOLIC PANEL
ALK PHOS: 40 U/L (ref 39–117)
ALT: 9 U/L (ref 0–35)
ANION GAP: 19 — AB (ref 5–15)
AST: 12 U/L (ref 0–37)
Albumin: 2.9 g/dL — ABNORMAL LOW (ref 3.5–5.2)
BILIRUBIN TOTAL: 0.2 mg/dL — AB (ref 0.3–1.2)
BUN: 14 mg/dL (ref 6–23)
CHLORIDE: 102 meq/L (ref 96–112)
CO2: 21 mEq/L (ref 19–32)
Calcium: 8.9 mg/dL (ref 8.4–10.5)
Creatinine, Ser: 0.78 mg/dL (ref 0.50–1.10)
GFR calc non Af Amer: 79 mL/min — ABNORMAL LOW (ref >90)
GLUCOSE: 62 mg/dL — AB (ref 70–99)
Potassium: 4.4 mEq/L (ref 3.7–5.3)
Sodium: 142 mEq/L (ref 137–147)
Total Protein: 6.1 g/dL (ref 6.0–8.3)

## 2014-04-21 LAB — GLUCOSE, CAPILLARY
GLUCOSE-CAPILLARY: 58 mg/dL — AB (ref 70–99)
Glucose-Capillary: 64 mg/dL — ABNORMAL LOW (ref 70–99)

## 2014-04-21 LAB — LACTATE DEHYDROGENASE: LDH: 305 U/L — ABNORMAL HIGH (ref 94–250)

## 2014-04-21 LAB — URIC ACID: Uric Acid, Serum: 0.5 mg/dL — ABNORMAL LOW (ref 2.4–7.0)

## 2014-04-21 MED ORDER — POLYETHYLENE GLYCOL 3350 17 G PO PACK
17.0000 g | PACK | Freq: Every day | ORAL | Status: DC
  Administered 2014-04-21 – 2014-04-22 (×2): 17 g via ORAL
  Filled 2014-04-21 (×2): qty 1

## 2014-04-21 NOTE — Progress Notes (Signed)
Patient's glucose in 50's with labs work this am. Checked CBG 58 patient given orange juice with repeat CBG 62 after drunk juice. Breakfast ordered and on the way.

## 2014-04-21 NOTE — Progress Notes (Signed)
[name]   DOB:[birth year]   WP#:[id number]    Subjective: She is feeling well. Denies shortness of breath. She is going to the bathroom regularly for urination. She has no bowel movements since admission. She denies nausea or mucositis. Her pain in the rib cages under control.  Objective:  Filed Vitals:   04/21/14 0619  BP: 144/84  Pulse: 111  Temp: 98.1 F (36.7 C)  Resp: 18     Intake/Output Summary (Last 24 hours) at 04/21/14 1344 Last data filed at 04/21/14 1230  Gross per 24 hour  Intake 3150.45 ml  Output   1525 ml  Net 1625.45 ml    GENERAL:alert, no distress and comfortable SKIN: skin color, texture, turgor are normal, no rashes or significant lesions EYES: normal, Conjunctiva are pink and non-injected, sclera clear OROPHARYNX:no exudate, no erythema and lips, buccal mucosa, and tongue normal  NECK: supple, thyroid normal size, non-tender, without nodularity LYMPH:  no palpable lymphadenopathy in the cervical, axillary or inguinal LUNGS: Reduced breath on the right lung base, normal breathing effort HEART: regular rate & rhythm and no murmurs and no lower extremity edema ABDOMEN:abdomen soft, non-tender and normal bowel sounds Musculoskeletal:no cyanosis of digits and no clubbing  NEURO: alert & oriented x 3 with fluent speech, no focal motor/sensory deficits   Labs:  Lab Results  Component Value Date   WBC 4.6 04/18/2014   HGB 11.2* 04/18/2014   HCT 34.7* 04/18/2014   MCV 94.4 04/18/2014   PLT 237 04/18/2014   NEUTROABS 2.5 04/18/2014    Lab Results  Component Value Date   NA 142 04/21/2014   K 4.4 04/21/2014   CL 102 04/21/2014   CO2 21 04/21/2014   CT scan of the chest, abdomen and pelvis with IV contrast on 7/23 reveals increased size of large right lateral chest wall mass, and new masses involving the right anterior chest wall and posterior chest wall subcutaneous tissues.  Increased mediastinal lymphadenopathy and right axillary lymphadenopathy. Increased  moderate to large right pleural effusion and right lung compressive atelectasis. Small soft tissue nodule in the posterior abdominal wall subcutaneous tissues, consistent tumor. Multiple lytic bone lesions, consistent with osseous involvement. Echocardiogram on 7/23 shows ejection fraction of 55% to 60%  On 7/24, patient to receive infusional chemotherapy with EPOCH. Consent form was signed on 7/22  Assessment & Plan:  1. Plasmablastic lymphoma  Her cancer is very aggressive and is causing malignant hypercalcemia. Today is cycle 1 day 2 of treatment. Hypercalcemia has resolved. She tolerated treatment well. She has no signs of tumor lysis syndrome.  2. Multiple myeloma  Her treatment is placed on hold due to newly diagnosed aggressive lymphoma.   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   4. Normocytic anemia  This is likely anemia of chronic disease. The patient denies recent history of bleeding such as epistaxis, hematuria or hematochezia. She is asymptomatic from the anemia. We will observe for now.   5. Hypercalcemia  Aggressive treatment was initiated on 7/23 as mentioned above; she was started on high-dose Solu-Medrol 125 mg intravenous daily, one dose of IV Rasburicase 3 mg X 1,Calcium with vitamin D supplement were held, and 1 liter IVF bolus followed by 100 cc of IV fluids continuous infusion were given with good response. Calcium has normalize. Continue to monitor. I will reduce IV fluids to 50 cc an hour.   6. DVT prophylaxis  On Lovenox   7. 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  She has no signs of tumor lysis syndrome.   8. History of Atrial Fibrillation Rate controlled.  Continue Aspirin therapy   9. Right chest wall pain Secondary to tumor presence and increased moderate to  large right pleural effusion  Pain controlled with current regimen including Oxycodone and  Tramadol with bowel regimen.  Follow up chest x ray to be performed if shortness of breath is present. If effusion continues to increase, she will need to have thoracentesis.  10. Full Code  11. Constipation  I will add MiraLAX. She continues taking Senokot and milk of magnesia.     [facility], [place], MD 04/21/2014  1:44 PM

## 2014-04-22 ENCOUNTER — Other Ambulatory Visit: Payer: Self-pay | Admitting: Hematology and Oncology

## 2014-04-22 DIAGNOSIS — J90 Pleural effusion, not elsewhere classified: Secondary | ICD-10-CM

## 2014-04-22 LAB — COMPREHENSIVE METABOLIC PANEL
ALK PHOS: 43 U/L (ref 39–117)
ALT: 10 U/L (ref 0–35)
AST: 12 U/L (ref 0–37)
Albumin: 2.8 g/dL — ABNORMAL LOW (ref 3.5–5.2)
Anion gap: 19 — ABNORMAL HIGH (ref 5–15)
BUN: 14 mg/dL (ref 6–23)
CHLORIDE: 102 meq/L (ref 96–112)
CO2: 20 mEq/L (ref 19–32)
Calcium: 8.3 mg/dL — ABNORMAL LOW (ref 8.4–10.5)
Creatinine, Ser: 0.65 mg/dL (ref 0.50–1.10)
GFR calc Af Amer: >90 mL/min (ref >90)
GFR calc non Af Amer: 84 mL/min — ABNORMAL LOW (ref >90)
Glucose, Bld: 75 mg/dL (ref 70–99)
POTASSIUM: 4.5 meq/L (ref 3.7–5.3)
Sodium: 141 mEq/L (ref 137–147)
Total Bilirubin: 0.3 mg/dL (ref 0.3–1.2)
Total Protein: 6.2 g/dL (ref 6.0–8.3)

## 2014-04-22 LAB — URIC ACID: Uric Acid, Serum: 1.3 mg/dL — ABNORMAL LOW (ref 2.4–7.0)

## 2014-04-22 LAB — LACTATE DEHYDROGENASE: LDH: 327 U/L — AB (ref 94–250)

## 2014-04-22 MED ORDER — METHYLPREDNISOLONE SODIUM SUCC 125 MG IJ SOLR
60.0000 mg | Freq: Every day | INTRAMUSCULAR | Status: DC
  Administered 2014-04-23 – 2014-04-24 (×2): 60 mg via INTRAVENOUS
  Filled 2014-04-22 (×2): qty 0.96

## 2014-04-22 MED ORDER — BOOST PLUS PO LIQD
237.0000 mL | Freq: Three times a day (TID) | ORAL | Status: DC
  Administered 2014-04-23 – 2014-04-24 (×2): 237 mL via ORAL
  Filled 2014-04-22 (×6): qty 237

## 2014-04-22 NOTE — Progress Notes (Signed)
[name]   DOB:[birth year]   MV#:[id number]    Subjective: She feels well. She tolerated chemotherapy without side effects. Denies mucositis on nausea. Constipation has resolved. The pain in her chest wall is well-controlled with current pain medication.  Objective:  Filed Vitals:   04/22/14 0533  BP: 130/78  Pulse: 90  Temp: 98.3 F (36.8 C)  Resp: 16     Intake/Output Summary (Last 24 hours) at 04/22/14 1500 Last data filed at 04/22/14 0534  Gross per 24 hour  Intake    360 ml  Output    500 ml  Net   -140 ml    GENERAL:alert, no distress and comfortable SKIN: skin color, texture, turgor are normal, no rashes or significant lesions EYES: normal, Conjunctiva are pink and non-injected, sclera clear OROPHARYNX:no exudate, no erythema and lips, buccal mucosa, and tongue normal  NECK: supple, thyroid normal size, non-tender, without nodularity LYMPH:  Palpable lymphadenopathy in the right axilla, none  elsewhere LUNGS: Reduced breath sounds on the right lung base, normal breathing effort HEART: regular rate & rhythm and no murmurs and no lower extremity edema ABDOMEN:abdomen soft, non-tender and normal bowel sounds Musculoskeletal:no cyanosis of digits and no clubbing  NEURO: alert & oriented x 3 with fluent speech, no focal motor/sensory deficits   Labs:  Lab Results  Component Value Date   WBC 4.6 04/18/2014   HGB 11.2* 04/18/2014   HCT 34.7* 04/18/2014   MCV 94.4 04/18/2014   PLT 237 04/18/2014   NEUTROABS 2.5 04/18/2014    Lab Results  Component Value Date   NA 141 04/22/2014   K 4.5 04/22/2014   CL 102 04/22/2014   CO2 20 04/22/2014   Assessment & Plan:  1. Plasmablastic lymphoma  Her cancer is very aggressive and is causing malignant hypercalcemia. Today is cycle 1 day 3 of treatment. Hypercalcemia has resolved. She tolerated treatment well. I will reduce Solu-Medrol to 60 mg daily. She has no signs of tumor lysis syndrome.   2. Multiple myeloma  Her treatment  is placed on hold due to newly diagnosed aggressive lymphoma.   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   4. Normocytic anemia  This is likely anemia of chronic disease. The patient denies recent history of bleeding such as epistaxis, hematuria or hematochezia. She is asymptomatic from the anemia. We will observe for now.   5. Hypercalcemia  Aggressive treatment was initiated on 7/23 as mentioned above; she was started on high-dose Solu-Medrol 125 mg intravenous daily, one dose of IV Rasburicase 3 mg X 1,Calcium with vitamin D supplement were held, and 1 liter IVF bolus followed by 100 cc of IV fluids continuous infusion were given with good response. Calcium has normalize. Continue to monitor. I will reduce IV fluids to 50 cc an hour.   6. DVT prophylaxis  On Lovenox   7. 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  She has no signs of tumor lysis syndrome.   8. History of Atrial Fibrillation Rate controlled.  Continue Aspirin therapy   9. Right chest wall pain with pleural effusion Secondary to tumor presence and increased moderate to  large right pleural effusion  Pain controlled with current regimen including Oxycodone and Tramadol with bowel regimen.  Follow up chest x ray to be performed if shortness of breath is present. If effusion continues to increase, she will need to have thoracentesis.  10. Full Code  11.  Constipation, resolved I will discontinue MiraLAX. She continues taking Senokot and milk of magnesia.       [place], [place], MD 04/22/2014  3:00 PM

## 2014-04-23 ENCOUNTER — Inpatient Hospital Stay
Admission: RE | Admit: 2014-04-23 | Payer: Medicare Other | Source: Ambulatory Visit | Admitting: Hematology and Oncology

## 2014-04-23 ENCOUNTER — Other Ambulatory Visit: Payer: Self-pay | Admitting: Hematology and Oncology

## 2014-04-23 ENCOUNTER — Telehealth: Payer: Self-pay | Admitting: Hematology and Oncology

## 2014-04-23 DIAGNOSIS — C8590 Non-Hodgkin lymphoma, unspecified, unspecified site: Secondary | ICD-10-CM

## 2014-04-23 LAB — COMPREHENSIVE METABOLIC PANEL
ALT: 9 U/L (ref 0–35)
AST: 12 U/L (ref 0–37)
Albumin: 2.6 g/dL — ABNORMAL LOW (ref 3.5–5.2)
Alkaline Phosphatase: 39 U/L (ref 39–117)
Anion gap: 19 — ABNORMAL HIGH (ref 5–15)
BILIRUBIN TOTAL: 0.4 mg/dL (ref 0.3–1.2)
BUN: 18 mg/dL (ref 6–23)
CHLORIDE: 99 meq/L (ref 96–112)
CO2: 22 meq/L (ref 19–32)
CREATININE: 0.62 mg/dL (ref 0.50–1.10)
Calcium: 8.3 mg/dL — ABNORMAL LOW (ref 8.4–10.5)
GFR calc Af Amer: >90 mL/min (ref >90)
GFR, EST NON AFRICAN AMERICAN: 85 mL/min — AB (ref >90)
GLUCOSE: 104 mg/dL — AB (ref 70–99)
Potassium: 4.3 mEq/L (ref 3.7–5.3)
Sodium: 140 mEq/L (ref 137–147)
Total Protein: 5.7 g/dL — ABNORMAL LOW (ref 6.0–8.3)

## 2014-04-23 LAB — URIC ACID: Uric Acid, Serum: 2.2 mg/dL — ABNORMAL LOW (ref 2.4–7.0)

## 2014-04-23 LAB — LACTATE DEHYDROGENASE: LDH: 331 U/L — AB (ref 94–250)

## 2014-04-23 NOTE — Progress Notes (Signed)
[name]   DOB:05-Dec-1935   SE#:[id number]    I have seen the patient, examined her and edited the notes as follows  Subjective: She feels well. She tolerated chemotherapy without side effects. Denies mucositis on nausea. Appetite adequate. Denies constipation, last bowel movement on 7/26. Right chest wall pain is well-controlled with current pain medication. Ambulating without difficulty. No confusion.  Objective:  Filed Vitals:   04/23/14 0450  BP: 140/83  Pulse: 93  Temp: 97.9 F (36.6 C)  Resp: 16     Intake/Output Summary (Last 24 hours) at 04/23/14 0740 Last data filed at 04/23/14 0737  Gross per 24 hour  Intake 3777.5 ml  Output    550 ml  Net 3227.5 ml    GENERAL:alert, no distress and comfortable SKIN: skin color, texture, turgor are normal, no rashes or significant lesions EYES: normal, Conjunctiva are pink and non-injected, sclera clear OROPHARYNX:no exudate, no erythema and lips, buccal mucosa, and tongue normal  NECK: supple, thyroid normal size, non-tender, without nodularity LYMPH:  Palpable lymphadenopathy in the right axilla, none  elsewhere LUNGS: Reduced breath sounds on the right lung base, normal breathing effort HEART: regular rate & rhythm and no murmurs and no lower extremity edema ABDOMEN:abdomen soft, non-tender and normal bowel sounds Musculoskeletal:no cyanosis of digits and no clubbing  NEURO: alert & oriented x 3 with fluent speech, no focal motor/sensory deficits   Labs:  Lab Results  Component Value Date   WBC 4.6 04/18/2014   HGB 11.2* 04/18/2014   HCT 34.7* 04/18/2014   MCV 94.4 04/18/2014   PLT 237 04/18/2014   NEUTROABS 2.5 04/18/2014    Lab Results  Component Value Date   NA 140 04/23/2014   K 4.3 04/23/2014   CL 99 04/23/2014   CO2 22 04/23/2014   Assessment & Plan:  1. Plasmablastic lymphoma  Her cancer is very aggressive and is causing malignant hypercalcemia. Today is cycle 1 day 4 of treatment. Hypercalcemia has  resolved. She tolerated treatment well. Solu-Medrol was reduced to 60 mg daily on 7/26. She has no signs of tumor lysis syndrome.  Plan to discharge tomorrow with Neulasta as out-patient on Wednesday.  2. Multiple myeloma  Her treatment is placed on hold due to newly diagnosed aggressive lymphoma.   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   4. Normocytic anemia  This is likely anemia of chronic disease. The patient denies recent history of bleeding such as epistaxis, hematuria or hematochezia. She is asymptomatic from the anemia. We will observe for now.   5. Hypercalcemia  Aggressive treatment was initiated on 7/23 as mentioned above; she was started on high-dose Solu-Medrol 125 mg intravenous daily, one dose of IV Rasburicase 3 mg X 1,Calcium with vitamin D supplement were held, and 1 liter IVF bolus followed by 100 cc of IV fluids continuous infusion were given with good response. Calcium has normalized. Continue to monitor. On 7/26, IV fluids were reduced to 50 cc an hour.   6. DVT prophylaxis  On Lovenox   7. 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  She has no signs of tumor lysis syndrome.   8. History of Atrial Fibrillation Rate controlled.  Continue Aspirin therapy   9. Right chest wall pain with pleural effusion Secondary to tumor presence and increased moderate to  large right pleural effusion  Pain controlled with current regimen including Oxycodone and Tramadol with bowel regimen.  Follow  up chest x ray to be performed if shortness of breath is present. If effusion continues to increase, she will need to have thoracentesis.  10. Full Code  11. Constipation, resolved MiraLAX was discontinued on 7/26. She continues taking Senokot and milk of magnesia.    12. Discharge planing likely within the next 48 hrs if clinically stable.   Rondel Jumbo, PA-C 04/23/2014  7:40  AM  Pericles Carmicheal, MD 04/23/2014

## 2014-04-23 NOTE — Telephone Encounter (Signed)
s/w pt re appt for 7/29 and 8/5. pt will get new schedule 7/29.

## 2014-04-24 DIAGNOSIS — M8708 Idiopathic aseptic necrosis of bone, other site: Secondary | ICD-10-CM

## 2014-04-24 MED ORDER — HEPARIN SOD (PORK) LOCK FLUSH 100 UNIT/ML IV SOLN
500.0000 [IU] | Freq: Once | INTRAVENOUS | Status: DC
  Filled 2014-04-24: qty 5

## 2014-04-24 NOTE — Discharge Summary (Signed)
Patient ID: [name] MRN: [id number] DOB/AGE: 1936-01-02 78 y.o.  Admit date: 04/19/2014 Discharge date: 04/24/2014  I have seen the patient, examined her and edited the notes as follows   Reason for admission: [name] is an 78 y.o. female directly admitted to [facility] for chemotherapy for the treatment of [place] with [facility].  In addition, she carries a history of with history of IgA kappa multiple myeloma, treatment on hold due to newly diagnosed aggressive lymphoma.   HPI: [name] was seen at the office of the [place] on 7/22 to review the results of her recent right axillary node biopsy on 7/15, confirming plasmablastic lymphoma. Other than mild back and rib pain and decreased appetite, she denied any other symptoms, such as fevers, chills, night sweats, nausea, vomiting, diarrhea or constipation. She had no confusion, headaches or vision changes. She complained of poor memory. She denied any myalgias. No new lymphadenopathy was noted by the patient. She denied any chest pain or palpitations. Labs were abnormal. Unfortunately the serum calcium was high at 11.1, with elevated uric acid at 8.0 and LDH at 322. She was at risk of kidney failure, spontaneous tumor lysis syndrome and worsening malignant hypercalcemia.Treatment was urgent. After evaluation, plans for direct admission on 7/23 for chemo on 7/24 were made. She has history of osteonecrosis of the jaw; IV pamidronate is contraindicated.    Hospital Course/Discharge Diagnoses:    1. Plasmablastic lymphoma  Her cancer is very aggressive and is causing malignant hypercalcemia. Today is cycle 1 day 5 of treatment. Hypercalcemia has resolved.  She tolerated treatment well. Solu-Medrol was reduced to 60 mg daily on 7/26.  She has no signs of tumor lysis syndrome.  She will be discharged today with Neulasta as outpatient on Wednesday, 7/29  2. Multiple myeloma  Her treatment is placed on hold due  to newly diagnosed aggressive lymphoma.   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   4. Normocytic anemia  This is likely anemia of chronic disease. The patient denies recent history of bleeding such as epistaxis, hematuria or hematochezia. She is asymptomatic from the anemia. We will observe as outpatient.   5. Hypercalcemia  Aggressive treatment was initiated on 7/23 as mentioned above; she was started on high-dose Solu-Medrol 125 mg intravenous daily, one dose of IV Rasburicase 3 mg X 1,Calcium with vitamin D supplement were held, and 1 liter IVF bolus followed by 100 cc of IV fluids continuous infusion were given with good response. Calcium has normalized. Continue to monitor. On 7/26, IV fluids were reduced to 50 cc an hour.   6. DVT prophylaxis  On Lovenox   7. Hyperuricemia  Secondary to malignancy  She was placed on Allopurinol daily and had received Rasburicase.  She has no signs of tumor lysis syndrome.   8. History of Atrial Fibrillation  Rate controlled.  Continue Aspirin therapy   9. Right chest wall pain with pleural effusion  Secondary to tumor presence and increased moderate to large right pleural effusion  Pain controlled with current regimen including Oxycodone and Tramadol with bowel regimen.  Follow up chest x ray to be performed if shortness of breath is present. If effusion continues to increase, she will need to have thoracentesis.   10. Full Code   11. Constipation She was placed on MiraLAx on admission.  Constipation resolved. MiraLAX was discontinued on 7/26. She continues taking Senokot and milk of magnesia.  On the day of discharge, she feels well. She tolerated chemotherapy without side effects. Denies mucositis on nausea. Appetite adequate. Denies constipation, last bowel movement on 7/27. Right chest wall pain is well-controlled with current pain medication. Ambulating without difficulty. No confusion. Eager to be  discharged home   Other Past Medical History  Diagnosis Date  . Anxiety   . Hypertension   . Hyperlipidemia   . Depression   . Osteopenia   . Acute renal failure      due to hypercalcemia and suspected multiple myeloma resolved =with IVF's    . Acute subdural hematoma 03/2012     recent fall in hospital stay,resolved+  . Arthritis     spine  . Atrial fibrillation       Medication List    STOP taking these medications       ibuprofen 200 MG tablet  Commonly known as:  ADVIL,MOTRIN     potassium chloride SA 20 MEQ tablet  Commonly known as:  K-DUR,KLOR-CON     predniSONE 50 MG tablet  Commonly known as:  DELTASONE     Vitamin D-3 5000 UNITS Tabs      TAKE these medications       acetaminophen 500 MG tablet  Commonly known as:  TYLENOL  Take 1,000 mg by mouth every 4 (four) hours as needed (Pain). No more than 6 tabs in one day.     allopurinol 300 MG tablet  Commonly known as:  ZYLOPRIM  Take 1 tablet (300 mg total) by mouth daily.     ALPRAZolam 1 MG tablet  Commonly known as:  XANAX  Take 1 tablet (1 mg total) by mouth at bedtime as needed for anxiety.     aspirin EC 81 MG tablet  Take 81 mg by mouth daily.     CALCIUM 500 + D PO  Take 1 tablet by mouth 2 (two) times daily.     lidocaine-prilocaine cream  Commonly known as:  EMLA  Apply 1 application topically daily as needed. Port     losartan 50 MG tablet  Commonly known as:  COZAAR  Take 50 mg by mouth daily.     multivitamin with minerals Tabs tablet  Take 1 tablet by mouth daily.     ondansetron 4 MG tablet  Commonly known as:  ZOFRAN  Take 4 mg by mouth every 4 (four) hours as needed for nausea.     ondansetron 8 MG tablet  Commonly known as:  ZOFRAN  Take 1 tablet (8 mg total) by mouth 2 (two) times daily as needed (Nausea or vomiting).     oxyCODONE 5 MG immediate release tablet  Commonly known as:  ROXICODONE  Take 1 tablet (5 mg total) by mouth every 4 (four) hours as needed for  severe pain.     oxyCODONE-acetaminophen 5-325 MG per tablet  Commonly known as:  PERCOCET/ROXICET  Take 1 tablet by mouth every 4 (four) hours as needed for moderate pain or severe pain.     prochlorperazine 10 MG tablet  Commonly known as:  COMPAZINE  Take 10 mg by mouth every 6 (six) hours as needed for nausea.     prochlorperazine 10 MG tablet  Commonly known as:  COMPAZINE  Take 1 tablet (10 mg total) by mouth every 6 (six) hours as needed (Nausea or vomiting).     rosuvastatin 10 MG tablet  Commonly known as:  CRESTOR  Take 10 mg by mouth daily.  tetrahydrozoline 0.05 % ophthalmic solution  Place 1 drop into both eyes 2 (two) times daily as needed (redness/dry eyes).     traMADol 50 MG tablet  Commonly known as:  ULTRAM  Take 1 tablet (50 mg total) by mouth every 8 (eight) hours as needed for pain.     traZODone 50 MG tablet  Commonly known as:  DESYREL  Take 1 tablet (50 mg total) by mouth at bedtime as needed for sleep.            Discharge Instructions   De-access Port A Cath    Complete by:  As directed   After chemo is completed     Discharge patient    Complete by:  As directed   After chemo is completed           Follow-up Information   Follow up with [facility], Jaice Digioia, MD. (For Neulasta injection on 7/29; Dr. Alvy Bimler on 8/5 at 10:15 (labs at 10:45))    Specialty:  Hematology and Oncology   Contact information:   [place] [id number] [phone]       Disposition: 01-Home or Self Care  Discharged Condition: Improved  Consults: Chemo Pharmacy Monitoring  Labs:   Results for orders placed during the hospital encounter of 04/19/14 (from the past 48 hour(s))  URIC ACID     Status: Abnormal   Collection Time    04/23/14  4:28 AM      Result Value Ref Range   Uric Acid, Serum 2.2 (*) 2.4 - 7.0 mg/dL  LACTATE DEHYDROGENASE     Status: Abnormal   Collection Time    04/23/14  4:28 AM      Result Value Ref Range   LDH 331  (*) 94 - 250 U/L  COMPREHENSIVE METABOLIC PANEL     Status: Abnormal   Collection Time    04/23/14  4:28 AM      Result Value Ref Range   Sodium 140  137 - 147 mEq/L   Potassium 4.3  3.7 - 5.3 mEq/L   Chloride 99  96 - 112 mEq/L   CO2 22  19 - 32 mEq/L   Glucose, Bld 104 (*) 70 - 99 mg/dL   BUN 18  6 - 23 mg/dL   Creatinine, Ser 0.62  0.50 - 1.10 mg/dL   Calcium 8.3 (*) 8.4 - 10.5 mg/dL   Total Protein 5.7 (*) 6.0 - 8.3 g/dL   Albumin 2.6 (*) 3.5 - 5.2 g/dL   AST 12  0 - 37 U/L   ALT 9  0 - 35 U/L   Alkaline Phosphatase 39  39 - 117 U/L   Total Bilirubin 0.4  0.3 - 1.2 mg/dL   GFR calc non Af Amer 85 (*) >90 mL/min   GFR calc Af Amer >90  >90 mL/min   Comment: (NOTE)     The eGFR has been calculated using the CKD EPI equation.     This calculation has not been validated in all clinical situations.     eGFR's persistently <90 mL/min signify possible Chronic Kidney     Disease.   Anion gap 19 (*) 5 - 15      CO2  21  04/21/2014       Procedures: 04/20/2014    ONCOLOGY TREATMENT: IP NON-HODGKINS LYMPHOMA EPOCH q21d     Days 1 through 5, Cycle 1     Chemotherapy: DOXOrubicin (ADRIAMYCIN) 18 mg, etoposide (VEPESID) 88 mg, vinCRIStine (ONCOVIN)  0.7 mg in sodium chloride 0.9 % 500 mL chemo infusion, cyclophosphamide (CYTOXAN) 1,320 mg in sodium chloride 0.9 % 250 mL chemo infusion       Imaging Studies:  CT Chest W Contrast  04/19/2014 COMPARISON:  Chest CT on 03/08/2014  FINDINGS: CT CHEST FINDINGS  Significant increase in size of right lateral chest wall mass is seen since prior study, which currently measures 8.9 x 14.0 cm on image 37 compared to 6.6 x 9.0 cm previously. Destruction of the right lateral seventh rib is again demonstrated.  New mass is now seen in the right anterior chest wall soft tissues which measures 2.7 x 7.9 cm on image 44. Increased lymphadenopathy is seen in the right axilla and right cardiophrenic angle. New 1.3 cm anterior mediastinal lymph node is  also seen anterior to the ascending aorta on image number 24. In addition, a 12 mm soft tissue nodule is seen in the subcutaneous tissues of the posterior chest wall distal right of midline on image 38, consistent with tumor.  Moderate to large right pleural effusion has increased in size, with increased compressive atelectasis. Left lung remains clear.  CT ABDOMEN FINDINGS  Spleen is normal in size and appearance. The liver, pancreas, adrenal glands, and kidneys are also unremarkable except for a few tiny left renal cysts. No evidence of hydronephrosis. No evidence of lymphadenopathy within the abdomen or pelvis. Uterus and adnexal regions are unremarkable.  Cholelithiasis is again demonstrated, however there is no evidence of cholecystitis or biliary dilatation. No other inflammatory process or abnormal fluid collections are identified. Normal appendix visualized. No evidence of bowel wall thickening or dilatation.  Small lytic bone lesions involving the left scapula and thoracic spine are stable. Similar small lytic bone lesions are seen in the pelvis. Old lower thoracic and lumbar vertebral body compression fractures noted. A 1.5 cm soft tissue nodule is seen in the subcutaneous tissues of the posterior abdominal wall just to the right of midline, also suspicious for tumor.  IMPRESSION: Increased size of large right lateral chest wall mass, and new masses involving the right anterior chest wall and posterior chest wall subcutaneous tissues.  Increased mediastinal lymphadenopathy and right axillary lymphadenopathy.  Increased moderate to large right pleural effusion and right lung compressive atelectasis.  Small soft tissue nodule in the posterior abdominal wall subcutaneous tissues, consistent tumor.  Multiple lytic bone lesions, consistent with osseous involvement. Those seen on prior chest CT show no significant interval change.   Electronically Signed   By: Earle Gell M.D.   On: 04/19/2014 11:11   CT  Abdomen W Contrast  04/19/2014 CT ABDOMEN FINDINGS  Spleen is normal in size and appearance. The liver, pancreas, adrenal glands, and kidneys are also unremarkable except for a few tiny left renal cysts. No evidence of hydronephrosis. No evidence of lymphadenopathy within the abdomen or pelvis. Uterus and adnexal regions are unremarkable.  Cholelithiasis is again demonstrated, however there is no evidence of cholecystitis or biliary dilatation. No other inflammatory process or abnormal fluid collections are identified. Normal appendix visualized. No evidence of bowel wall thickening or dilatation.  Small lytic bone lesions involving the left scapula and thoracic spine are stable. Similar small lytic bone lesions are seen in the pelvis. Old lower thoracic and lumbar vertebral body compression fractures noted. A 1.5 cm soft tissue nodule is seen in the subcutaneous tissues of the posterior abdominal wall just to the right of midline, also suspicious for tumor.  IMPRESSION: Increased size of large right  lateral chest wall mass, and new masses involving the right anterior chest wall and posterior chest wall subcutaneous tissues.  Increased mediastinal lymphadenopathy and right axillary lymphadenopathy.  Increased moderate to large right pleural effusion and right lung compressive atelectasis.  Small soft tissue nodule in the posterior abdominal wall subcutaneous tissues, consistent tumor.  Multiple lytic bone lesions, consistent with osseous involvement. Those seen on prior chest CT show no significant interval change.   Electronically Signed   By: Earle Gell M.D.   On: 04/19/2014 11:11   Echocardiogram on 7/23 shows ejection fraction of 55% to 60%     Discharge Exam:  Blood pressure 140/82, pulse 95, temperature 97.2 F (36.2 C), temperature source Oral, resp. rate 16, height $RemoveBe'5\' 6"'tpPJeFLbh$  (1.676 m), weight 147 lb (66.679 kg), SpO2 94.00%.  GENERAL:alert, no distress and comfortable  SKIN: skin color, texture,  turgor are normal, no rashes or significant lesions  EYES: normal, Conjunctiva are pink and non-injected, sclera clear  OROPHARYNX:no exudate, no erythema and lips, buccal mucosa, and tongue normal  NECK: supple, thyroid normal size, non-tender, without nodularity  LYMPH: Palpable lymphadenopathy in the right axilla, none elsewhere  LUNGS: Reduced breath sounds on the right lung base, normal breathing effort  HEART: regular rate & rhythm and no murmurs and no lower extremity edema  ABDOMEN:abdomen soft, non-tender and normal bowel sounds  Musculoskeletal:no cyanosis of digits and no clubbing  NEURO: alert & oriented x 3 with fluent speech, no focal motor/sensory deficits  SignedSharene Butters E 04/24/2014, 7:49 AM  Kirstina Leinweber, MD 04/24/2014

## 2014-04-24 NOTE — Progress Notes (Signed)
[name]   DOB:1936-08-24   HC#:[id number]    I have seen the patient, examined her and edited the notes as follows  Subjective: She feels well. She tolerated chemotherapy without side effects. Denies mucositis on nausea. Appetite adequate. Denies constipation, last bowel movement on 7/27. Right chest wall pain is well-controlled with current pain medication. Ambulating without difficulty. No confusion. Eager to be discharged home.  Objective:  Filed Vitals:   04/24/14 0524  BP: 140/82  Pulse: 95  Temp: 97.2 F (36.2 C)  Resp: 16     Intake/Output Summary (Last 24 hours) at 04/24/14 0719 Last data filed at 04/24/14 0600  Gross per 24 hour  Intake   1452 ml  Output      0 ml  Net   1452 ml    GENERAL:alert, no distress and comfortable SKIN: skin color, texture, turgor are normal, no rashes or significant lesions EYES: normal, Conjunctiva are pink and non-injected, sclera clear OROPHARYNX:no exudate, no erythema and lips, buccal mucosa, and tongue normal  NECK: supple, thyroid normal size, non-tender, without nodularity LYMPH:  Palpable lymphadenopathy in the right axilla, none  elsewhere LUNGS: Reduced breath sounds on the right lung base, normal breathing effort HEART: regular rate & rhythm and no murmurs and no lower extremity edema ABDOMEN:abdomen soft, non-tender and normal bowel sounds Musculoskeletal:no cyanosis of digits and no clubbing  NEURO: alert & oriented x 3 with fluent speech, no focal motor/sensory deficits   Labs:  Lab Results  Component Value Date   WBC 4.6 04/18/2014   HGB 11.2* 04/18/2014   HCT 34.7* 04/18/2014   MCV 94.4 04/18/2014   PLT 237 04/18/2014   NEUTROABS 2.5 04/18/2014    Lab Results  Component Value Date   NA 140 04/23/2014   K 4.3 04/23/2014   CL 99 04/23/2014   CO2 22 04/23/2014   Assessment & Plan:   1. Plasmablastic lymphoma  Her cancer is very aggressive and is causing malignant hypercalcemia. Today is cycle 1 day 5 of treatment.  Hypercalcemia has resolved. She tolerated treatment well. Solu-Medrol was reduced to 60 mg daily on 7/26, to be discontinued upon discharge today She has no signs of tumor lysis syndrome.  She will be discharged today with Neulasta as outpatient on Wednesday, 7/29  2. Multiple myeloma  Her treatment is placed on hold due to newly diagnosed aggressive lymphoma.   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   4. Normocytic anemia  This is likely anemia of chronic disease. The patient denies recent history of bleeding such as epistaxis, hematuria or hematochezia. She is asymptomatic from the anemia. Observation alone is recommended.  5. Hypercalcemia  Aggressive treatment was initiated on 7/23 as mentioned above; she was started on high-dose SoluMedrol 125 mg intravenous daily, one dose of IV Rasburicase 3 mg X 1,Calcium with vitamin D supplement were held, and 1 liter IVF bolus followed by 100 cc of IV fluids continuous infusion were given with good response. Calcium has normalized. Continue to monitor. On 7/26, IV fluids were reduced to 50 cc an hour.   6. DVT prophylaxis  On Lovenox   7. Hyperuricemia Secondary to malignancy She was placed on Allopurinol daily and had received Rasburicase.  She has no signs of tumor lysis syndrome.   8. History of Atrial Fibrillation Rate controlled.  Continue Aspirin therapy   9. Right chest wall pain with pleural effusion Secondary to tumor presence and increased moderate to large right  pleural effusion  Pain controlled with current regimen including Oxycodone and Tramadol with bowel regimen.  Follow up chest x ray to be performed if shortness of breath is present. If effusion continues to increase, she will need to have thoracentesis as outpatient.  10. Full Code  11. Constipation, resolved MiraLAX was discontinued on 7/26. She continues taking Senokot and milk of magnesia.    12. Discharge planing She is  clonically stable. To be discharged home on 7/28. She weill return to the [facility] for Neulasta injection on 7/29 and appointment with Dr. Alvy Bimler on 8/5 at 10:15 (labs at 9:45)  Rondel Jumbo, PA-C 04/24/2014  7:19 AM Chi Garlow, MD 04/24/2014 Spent 35 mins on discharge

## 2014-04-25 ENCOUNTER — Other Ambulatory Visit: Payer: Self-pay | Admitting: Hematology and Oncology

## 2014-04-25 ENCOUNTER — Ambulatory Visit (HOSPITAL_BASED_OUTPATIENT_CLINIC_OR_DEPARTMENT_OTHER): Payer: Medicare Other

## 2014-04-25 VITALS — BP 144/96 | HR 114 | Temp 97.5°F

## 2014-04-25 DIAGNOSIS — C8589 Other specified types of non-Hodgkin lymphoma, extranodal and solid organ sites: Secondary | ICD-10-CM

## 2014-04-25 DIAGNOSIS — C8590 Non-Hodgkin lymphoma, unspecified, unspecified site: Secondary | ICD-10-CM

## 2014-04-25 DIAGNOSIS — C9000 Multiple myeloma not having achieved remission: Secondary | ICD-10-CM

## 2014-04-25 DIAGNOSIS — Z5189 Encounter for other specified aftercare: Secondary | ICD-10-CM

## 2014-04-25 DIAGNOSIS — R0781 Pleurodynia: Secondary | ICD-10-CM

## 2014-04-25 MED ORDER — OXYCODONE HCL 5 MG PO TABS: 5.0000 mg | ORAL_TABLET | ORAL | Status: DC | PRN

## 2014-04-25 MED ORDER — PEGFILGRASTIM INJECTION 6 MG/0.6ML
6.0000 mg | Freq: Once | SUBCUTANEOUS | Status: AC
  Administered 2014-04-25: 6 mg via SUBCUTANEOUS
  Filled 2014-04-25: qty 0.6

## 2014-04-25 NOTE — Patient Instructions (Signed)
Take Claritin 10 mg daily for 5-7 days.   Use Tylenol or Ibuprofen as needed for discomfort.  Use pain med if this is not effective. Call us if temperature goes over 100.5.  Pegfilgrastim injection What is this medicine? PEGFILGRASTIM (peg fil GRA stim) is a long-acting granulocyte colony-stimulating factor that stimulates the growth of neutrophils, a type of white blood cell important in the body's fight against infection. It is used to reduce the incidence of fever and infection in patients with certain types of cancer who are receiving chemotherapy that affects the bone marrow. This medicine may be used for other purposes; ask your health care provider or pharmacist if you have questions. COMMON BRAND NAME(S): Neulasta What should I tell my health care provider before I take this medicine? They need to know if you have any of these conditions: -latex allergy -ongoing radiation therapy -sickle cell disease -skin reactions to acrylic adhesives (On-Body Injector only) -an unusual or allergic reaction to pegfilgrastim, filgrastim, other medicines, foods, dyes, or preservatives -pregnant or trying to get pregnant -breast-feeding How should I use this medicine? This medicine is for injection under the skin. If you get this medicine at home, you will be taught how to prepare and give the pre-filled syringe or how to use the On-body Injector. Refer to the patient Instructions for Use for detailed instructions. Use exactly as directed. Take your medicine at regular intervals. Do not take your medicine more often than directed. It is important that you put your used needles and syringes in a special sharps container. Do not put them in a trash can. If you do not have a sharps container, call your pharmacist or healthcare provider to get one. Talk to your pediatrician regarding the use of this medicine in children. Special care may be needed. Overdosage: If you think you have taken too much of this  medicine contact a poison control center or emergency room at once. NOTE: This medicine is only for you. Do not share this medicine with others. What if I miss a dose? It is important not to miss your dose. Call your doctor or health care professional if you miss your dose. If you miss a dose due to an On-body Injector failure or leakage, a new dose should be administered as soon as possible using a single prefilled syringe for manual use. What may interact with this medicine? Interactions have not been studied. Give your health care provider a list of all the medicines, herbs, non-prescription drugs, or dietary supplements you use. Also tell them if you smoke, drink alcohol, or use illegal drugs. Some items may interact with your medicine. This list may not describe all possible interactions. Give your health care provider a list of all the medicines, herbs, non-prescription drugs, or dietary supplements you use. Also tell them if you smoke, drink alcohol, or use illegal drugs. Some items may interact with your medicine. What should I watch for while using this medicine? You may need blood work done while you are taking this medicine. If you are going to need a MRI, CT scan, or other procedure, tell your doctor that you are using this medicine (On-Body Injector only). What side effects may I notice from receiving this medicine? Side effects that you should report to your doctor or health care professional as soon as possible: -allergic reactions like skin rash, itching or hives, swelling of the face, lips, or tongue -dizziness -fever -pain, redness, or irritation at site where injected -pinpoint red spots on  the skin -shortness of breath or breathing problems -stomach or side pain, or pain at the shoulder -swelling -tiredness -trouble passing urine Side effects that usually do not require medical attention (report to your doctor or health care professional if they continue or are  bothersome): -bone pain -muscle pain This list may not describe all possible side effects. Call your doctor for medical advice about side effects. You may report side effects to FDA at 1-800-FDA-1088. Where should I keep my medicine? Keep out of the reach of children. Store pre-filled syringes in a refrigerator between 2 and 8 degrees C (36 and 46 degrees F). Do not freeze. Keep in carton to protect from light. Throw away this medicine if it is left out of the refrigerator for more than 48 hours. Throw away any unused medicine after the expiration date. NOTE: This sheet is a summary. It may not cover all possible information. If you have questions about this medicine, talk to your doctor, pharmacist, or health care provider.  2015, [employer] Standard. (2013-12-14 16:14:05)

## 2014-04-26 ENCOUNTER — Encounter: Payer: Self-pay | Admitting: Family Medicine

## 2014-04-26 ENCOUNTER — Ambulatory Visit (INDEPENDENT_AMBULATORY_CARE_PROVIDER_SITE_OTHER): Payer: Medicare Other | Admitting: Family Medicine

## 2014-04-26 VITALS — BP 114/78 | HR 114 | Wt 154.0 lb

## 2014-04-26 DIAGNOSIS — F329 Major depressive disorder, single episode, unspecified: Secondary | ICD-10-CM

## 2014-04-26 DIAGNOSIS — R7309 Other abnormal glucose: Secondary | ICD-10-CM

## 2014-04-26 DIAGNOSIS — R7301 Impaired fasting glucose: Secondary | ICD-10-CM

## 2014-04-26 DIAGNOSIS — E785 Hyperlipidemia, unspecified: Secondary | ICD-10-CM

## 2014-04-26 DIAGNOSIS — F3289 Other specified depressive episodes: Secondary | ICD-10-CM

## 2014-04-26 DIAGNOSIS — F411 Generalized anxiety disorder: Secondary | ICD-10-CM

## 2014-04-26 DIAGNOSIS — I10 Essential (primary) hypertension: Secondary | ICD-10-CM

## 2014-04-26 LAB — POCT GLYCOSYLATED HEMOGLOBIN (HGB A1C): Hemoglobin A1C: 5.2

## 2014-04-26 MED ORDER — ALPRAZOLAM 1 MG PO TABS: 1.0000 mg | ORAL_TABLET | Freq: Every evening | ORAL | Status: DC | PRN

## 2014-04-26 NOTE — Progress Notes (Signed)
   Subjective:    Patient ID: [name], female    DOB: 1935/10/13, 78 y.o.   MRN: [id number]  HPI Here today for followup for depression/anxiety-overall she feels like she is doing very well. She was just in the hospital for 5 days for chemotherapy and was discharged home on Tuesday. She says she feels a little nauseated today and very fatigued. Overall she says she is in good spirits and is very hopeful about her multiple myeloma. She is using her walker today to help prevent falls. She did have to take a nausea pill this morning but typically uses them rarely.  Hyperlipidemia-taking her Crestor nightly without any side effects or problems. It has been a while since we last checked her lipids.  Hypertension- Pt denies chest pain, SOB, dizziness, or heart palpitations.  Taking meds as directed w/o problems.  Denies medication side effects.    Impaired fasting glucose-last hemoglobin A1c was 6.6 in August of 2013. Due to recheck her level today. We asked her to come in specifically for this.  Insomnia-she says some night she'll take one half or 2 tabs for the alprazolam to help her sleep. She says it's very difficult for her to get her mind to stop and call down enough to go to sleep. She is requesting that we increase her number of tabs per month. She denies feeling sedated her waking up groggy on the medication.   Review of Systems     Objective:   Physical Exam  Constitutional: She is oriented to person, place, and time. She appears well-developed and well-nourished.  HENT:  Head: Normocephalic and atraumatic.  She does have some mild edema under both eyes.  Cardiovascular: Normal rate, regular rhythm and normal heart sounds.   No carotid bruits.  Pulmonary/Chest: Effort normal and breath sounds normal.  Neurological: She is alert and oriented to person, place, and time.  Skin: Skin is warm and dry.  Psychiatric: She has a normal mood and affect. Her behavior is normal.           Assessment & Plan:  Depression/anxiety-her PHQ 9 score today is 7. 3 of the points war for moving slowly. 2 of the points were for low energy. As far as feeling little interest or pleasure in doing things she felt like she was not experiencing that at all. Well-controlled.  Insomnia-increase her Xanax dose to 1-2 tabs at bedtime as needed. #60 tabs provided with 2 refills.  Hypertension-well-controlled on current regimen.Due for lipids and CMP. Given lab slip today. She can certainly have a strong when she has her additional labs on at the [facility]. Encouraged her to just take the lab slip with her at that time. I don't want her to have to make an additional trip here for that.  And perivesical glucose-repeat hemoglobin A1c was 5.2 today which is absolutely fantastic. Encouraged her to be liberal with her diet as she is undergoing chemotherapy.

## 2014-04-26 NOTE — Patient Instructions (Signed)
Return in 6 months for Anxiety.

## 2014-04-30 ENCOUNTER — Ambulatory Visit (INDEPENDENT_AMBULATORY_CARE_PROVIDER_SITE_OTHER): Payer: Medicare Other | Admitting: Surgery

## 2014-04-30 ENCOUNTER — Encounter (INDEPENDENT_AMBULATORY_CARE_PROVIDER_SITE_OTHER): Payer: Self-pay | Admitting: Surgery

## 2014-04-30 VITALS — BP 128/68 | HR 96 | Resp 14 | Ht 66.0 in | Wt 147.6 lb

## 2014-04-30 DIAGNOSIS — Z9889 Other specified postprocedural states: Secondary | ICD-10-CM

## 2014-04-30 NOTE — Progress Notes (Signed)
Patient returns after right axillary biopsy.. The results are consistent with multiple myeloma. Lymphoma less favored. She is undergoing chemotherapy. Tired but in good spirits.  Exam: Small seroma right axilla.  No redness.    Impression: Status post right axillary lymph node biopsy two and one  half weeks ago  Plan: Per oncology. Followup as needed

## 2014-04-30 NOTE — Patient Instructions (Signed)
Return as needed

## 2014-05-02 ENCOUNTER — Encounter: Payer: Self-pay | Admitting: Hematology and Oncology

## 2014-05-02 ENCOUNTER — Ambulatory Visit: Payer: Medicare Other

## 2014-05-02 ENCOUNTER — Ambulatory Visit (HOSPITAL_BASED_OUTPATIENT_CLINIC_OR_DEPARTMENT_OTHER): Payer: Medicare Other | Admitting: Hematology and Oncology

## 2014-05-02 ENCOUNTER — Other Ambulatory Visit (HOSPITAL_BASED_OUTPATIENT_CLINIC_OR_DEPARTMENT_OTHER): Payer: Medicare Other

## 2014-05-02 ENCOUNTER — Telehealth: Payer: Self-pay | Admitting: Hematology and Oncology

## 2014-05-02 VITALS — BP 130/68 | HR 73 | Temp 98.4°F | Resp 16 | Ht 66.0 in | Wt 146.5 lb

## 2014-05-02 DIAGNOSIS — C9000 Multiple myeloma not having achieved remission: Secondary | ICD-10-CM

## 2014-05-02 DIAGNOSIS — R300 Dysuria: Secondary | ICD-10-CM

## 2014-05-02 DIAGNOSIS — R590 Localized enlarged lymph nodes: Secondary | ICD-10-CM

## 2014-05-02 DIAGNOSIS — D701 Agranulocytosis secondary to cancer chemotherapy: Secondary | ICD-10-CM | POA: Insufficient documentation

## 2014-05-02 DIAGNOSIS — E876 Hypokalemia: Secondary | ICD-10-CM

## 2014-05-02 DIAGNOSIS — K1231 Oral mucositis (ulcerative) due to antineoplastic therapy: Secondary | ICD-10-CM

## 2014-05-02 DIAGNOSIS — R5383 Other fatigue: Secondary | ICD-10-CM

## 2014-05-02 DIAGNOSIS — D696 Thrombocytopenia, unspecified: Secondary | ICD-10-CM

## 2014-05-02 DIAGNOSIS — R599 Enlarged lymph nodes, unspecified: Secondary | ICD-10-CM

## 2014-05-02 DIAGNOSIS — T451X5A Adverse effect of antineoplastic and immunosuppressive drugs, initial encounter: Secondary | ICD-10-CM

## 2014-05-02 DIAGNOSIS — R5381 Other malaise: Secondary | ICD-10-CM

## 2014-05-02 DIAGNOSIS — C8590 Non-Hodgkin lymphoma, unspecified, unspecified site: Secondary | ICD-10-CM

## 2014-05-02 DIAGNOSIS — G4700 Insomnia, unspecified: Secondary | ICD-10-CM

## 2014-05-02 DIAGNOSIS — D72819 Decreased white blood cell count, unspecified: Secondary | ICD-10-CM

## 2014-05-02 HISTORY — DX: Oral mucositis (ulcerative) due to antineoplastic therapy: K12.31

## 2014-05-02 HISTORY — DX: Dysuria: R30.0

## 2014-05-02 LAB — CBC WITH DIFFERENTIAL/PLATELET
BASO%: 0 % (ref 0.0–2.0)
Basophils Absolute: 0 10*3/uL (ref 0.0–0.1)
EOS ABS: 0 10*3/uL (ref 0.0–0.5)
EOS%: 1.3 % (ref 0.0–7.0)
HCT: 26.6 % — ABNORMAL LOW (ref 34.8–46.6)
HGB: 9.1 g/dL — ABNORMAL LOW (ref 11.6–15.9)
LYMPH%: 14.1 % (ref 14.0–49.7)
MCH: 31.1 pg (ref 25.1–34.0)
MCHC: 34.2 g/dL (ref 31.5–36.0)
MCV: 90.8 fL (ref 79.5–101.0)
MONO#: 0.1 10*3/uL (ref 0.1–0.9)
MONO%: 16.7 % — ABNORMAL HIGH (ref 0.0–14.0)
NEUT#: 0.5 10*3/uL — CL (ref 1.5–6.5)
NEUT%: 67.9 % (ref 38.4–76.8)
PLATELETS: 41 10*3/uL — AB (ref 145–400)
RBC: 2.93 10*6/uL — AB (ref 3.70–5.45)
RDW: 13.7 % (ref 11.2–14.5)
WBC: 0.8 10*3/uL — CL (ref 3.9–10.3)
lymph#: 0.1 10*3/uL — ABNORMAL LOW (ref 0.9–3.3)

## 2014-05-02 LAB — COMPREHENSIVE METABOLIC PANEL (CC13)
ALK PHOS: 56 U/L (ref 40–150)
ALT: 9 U/L (ref 0–55)
AST: 14 U/L (ref 5–34)
Albumin: 2.4 g/dL — ABNORMAL LOW (ref 3.5–5.0)
Anion Gap: 11 mEq/L (ref 3–11)
BILIRUBIN TOTAL: 0.62 mg/dL (ref 0.20–1.20)
BUN: 13.4 mg/dL (ref 7.0–26.0)
CO2: 31 mEq/L — ABNORMAL HIGH (ref 22–29)
Calcium: 9.3 mg/dL (ref 8.4–10.4)
Chloride: 97 mEq/L — ABNORMAL LOW (ref 98–109)
Creatinine: 0.9 mg/dL (ref 0.6–1.1)
GLUCOSE: 180 mg/dL — AB (ref 70–140)
Potassium: 3.1 mEq/L — ABNORMAL LOW (ref 3.5–5.1)
SODIUM: 139 meq/L (ref 136–145)
TOTAL PROTEIN: 5.7 g/dL — AB (ref 6.4–8.3)

## 2014-05-02 LAB — LACTATE DEHYDROGENASE (CC13): LDH: 171 U/L (ref 125–245)

## 2014-05-02 LAB — URIC ACID (CC13): URIC ACID, SERUM: 3.2 mg/dL (ref 2.6–7.4)

## 2014-05-02 MED ORDER — MAGIC MOUTHWASH W/LIDOCAINE
5.0000 mL | Freq: Three times a day (TID) | ORAL | Status: DC
Start: 2014-05-02 — End: 2014-06-28

## 2014-05-02 MED ORDER — CIPROFLOXACIN HCL 250 MG PO TABS
250.0000 mg | ORAL_TABLET | Freq: Two times a day (BID) | ORAL | Status: DC
Start: 2014-05-02 — End: 2014-05-09

## 2014-05-02 NOTE — Assessment & Plan Note (Signed)
This had resolved. I will monitor her calcium level weekly.

## 2014-05-02 NOTE — Assessment & Plan Note (Signed)
I am concerned about urinary tract infection. I gave her a prescription of ciprofloxacin to take for one week.

## 2014-05-02 NOTE — Assessment & Plan Note (Signed)
This is due to recent chemotherapy. Observe only for now.

## 2014-05-02 NOTE — Assessment & Plan Note (Signed)
I will prescribe magic mouthwash.

## 2014-05-02 NOTE — Progress Notes (Signed)
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    04/08/2012 Bone Marrow Biopsy BM biopsy showed 92% plasma cell, loss chromosome 8 and 13, addition of chromosome 8p, 14q and Myeloma FISH showed 13q and extra chromosome 14   04/12/2012 - 10/20/2012 Chemotherapy Velcade 3 weeks on 1 week off, Revlimid 15 mg PO days 1-21 days, dexamethasone 40 mg weekly.    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   01/30/2014 Procedure She has placement of Infuse-a-Port   02/01/2014 - 03/01/2014 Chemotherapy Due to progression of disease, Velcade was added back. Revlimid doses reduced to 5 mg, 21 days on 7 days off. Treatment was discontinued due to new palpable lymphadenopathy.   03/08/2014 Imaging Increased size of large right lateral chest wall soft tissue mass with destruction of the left lateral seventh rib. New lymphadenopathy in the right lateral chest wall, right axilla, and right cardiophrenic angle.     04/11/2014 Surgery She underwent excisional lymph node  biopsy.   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   04/19/2014 - 04/24/2014 Hospital Admission The patient was admitted to the hospital for cycle 1 of chemotherapy for malignant hypercalcemia.   05/01/2014 Pathology Results I reviewed her case at the hematology tumor board. 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   INTERVAL HISTORY: Please see below for problem oriented charting. She returns to followup on toxicity review of post cycle 1 of chemotherapy. She complain of fatigue. She also complained of mild mucositis but no nausea or vomiting. She has mild loose bowel movement. She complained of dysuria. She has poor appetite and has lost some weight. Her rate pain is under control with pain medicine twice a day.  REVIEW OF SYSTEMS:   Constitutional: Denies fevers, chills or abnormal weight loss Eyes: Denies blurriness of vision Respiratory: Denies cough, dyspnea or wheezes Cardiovascular: Denies palpitation, chest discomfort or lower extremity swelling Skin: Denies abnormal skin rashes Lymphatics: Denies new lymphadenopathy or easy bruising Neurological:Denies numbness, tingling or new weaknesses Behavioral/Psych: Mood is stable, no new changes  All other systems were reviewed with the patient and are negative.  I have reviewed the past medical history, past surgical history, social history and family history with the patient and they are unchanged from previous note.  ALLERGIES:  is allergic to simvastatin and zometa.  MEDICATIONS:  Current Outpatient Prescriptions  Medication Sig Dispense Refill  . acyclovir (ZOVIRAX) 400 MG tablet Take 400 mg by mouth daily.      Marland Kitchen  allopurinol (ZYLOPRIM) 300 MG tablet Take 1 tablet (300 mg total) by mouth daily.  30 tablet  0  . ALPRAZolam (XANAX) 1 MG tablet Take 1-2 tablets (1-2 mg total) by mouth at bedtime as needed for anxiety.  60 tablet  2  .  aspirin EC 81 MG tablet Take 81 mg by mouth daily.      . Calcium Carbonate-Vitamin D (CALCIUM 500 + D PO) Take 1 tablet by mouth 2 (two) times daily.      Marland Kitchen lidocaine-prilocaine (EMLA) cream Apply 1 application topically daily as needed. Port      . losartan (COZAAR) 50 MG tablet Take 50 mg by mouth daily.      . Multiple Vitamin (MULTIVITAMIN WITH MINERALS) TABS Take 1 tablet by mouth daily.      . ondansetron (ZOFRAN) 8 MG tablet Take 1 tablet (8 mg total) by mouth 2 (two) times daily as needed (Nausea or vomiting).  30 tablet  1  . oxyCODONE (ROXICODONE) 5 MG immediate release tablet Take 1 tablet (5 mg total) by mouth every 4 (four) hours as needed for severe pain.  60 tablet  0  . oxyCODONE-acetaminophen (PERCOCET/ROXICET) 5-325 MG per tablet Take 1 tablet by mouth every 4 (four) hours as needed for moderate pain or severe pain.      . predniSONE (DELTASONE) 50 MG tablet       . prochlorperazine (COMPAZINE) 10 MG tablet Take 1 tablet (10 mg total) by mouth every 6 (six) hours as needed (Nausea or vomiting).  30 tablet  1  . REVLIMID 5 MG capsule       . rosuvastatin (CRESTOR) 10 MG tablet Take 10 mg by mouth daily.      Marland Kitchen tetrahydrozoline 0.05 % ophthalmic solution Place 1 drop into both eyes 2 (two) times daily as needed (redness/dry eyes).      . traMADol (ULTRAM) 50 MG tablet Take 1 tablet (50 mg total) by mouth every 8 (eight) hours as needed for pain.  60 tablet  3  . traZODone (DESYREL) 50 MG tablet Take 1 tablet (50 mg total) by mouth at bedtime as needed for sleep.  90 tablet  3  . Alum & Mag Hydroxide-Simeth (MAGIC MOUTHWASH W/LIDOCAINE) SOLN Take 5 mLs by mouth 3 (three) times daily.  240 mL  0  . ciprofloxacin (CIPRO) 250 MG tablet Take 1 tablet (250 mg total) by mouth 2 (two) times daily.  14 tablet  0   No current facility-administered medications for this visit.    PHYSICAL EXAMINATION: ECOG PERFORMANCE STATUS: 1 - Symptomatic but completely ambulatory  Filed Vitals:    05/02/14 0956  BP: 130/68  Pulse: 73  Temp: 98.4 F (36.9 C)  Resp: 16   Filed Weights   05/02/14 0956  Weight: 146 lb 8 oz (66.452 kg)    GENERAL:alert, no distress and comfortable. She looks thin but not cachectic SKIN: skin color, texture, turgor are normal, no rashes or significant lesions EYES: normal, Conjunctiva are pale and non-injected, sclera clear OROPHARYNX:no exudate, no erythema and lips, buccal mucosa, and tongue normal but I am not able to appreciate mucositis or thrush. NECK: supple, thyroid normal size, non-tender, without nodularity LYMPH:  no palpable lymphadenopathy in the cervical, axillary or inguinal LUNGS: clear to auscultation and percussion with normal breathing effort. Prior pleural effusion has regressed. HEART: regular rate & rhythm and no murmurs and no lower extremity edema ABDOMEN:abdomen soft, non-tender and normal bowel sounds Musculoskeletal:no cyanosis of digits and  no clubbing. Prior palpable rib cage mass has reduced in size.  NEURO: alert & oriented x 3 with fluent speech, no focal motor/sensory deficits  LABORATORY DATA:  I have reviewed the data as listed    Component Value Date/Time   NA 139 05/02/2014 0934   NA 140 04/23/2014 0428   K 3.1* 05/02/2014 0934   K 4.3 04/23/2014 0428   CL 99 04/23/2014 0428   CL 105 02/23/2013 1334   CO2 31* 05/02/2014 0934   CO2 22 04/23/2014 0428   GLUCOSE 180* 05/02/2014 0934   GLUCOSE 104* 04/23/2014 0428   GLUCOSE 83 02/23/2013 1334   GLUCOSE 96 09/30/2006 1322   BUN 13.4 05/02/2014 0934   BUN 18 04/23/2014 0428   CREATININE 0.9 05/02/2014 0934   CREATININE 0.62 04/23/2014 0428   CREATININE 1.16* 07/10/2013 1001   CALCIUM 9.3 05/02/2014 0934   CALCIUM 8.3* 04/23/2014 0428   CALCIUM 11.8* 04/04/2012 0420   PROT 5.7* 05/02/2014 0934   PROT 5.7* 04/23/2014 0428   ALBUMIN 2.4* 05/02/2014 0934   ALBUMIN 2.6* 04/23/2014 0428   AST 14 05/02/2014 0934   AST 12 04/23/2014 0428   ALT 9 05/02/2014 0934   ALT 9 04/23/2014 0428    ALKPHOS 56 05/02/2014 0934   ALKPHOS 39 04/23/2014 0428   BILITOT 0.62 05/02/2014 0934   BILITOT 0.4 04/23/2014 0428   GFRNONAA 85* 04/23/2014 0428   GFRNONAA 46* 07/10/2013 1001   GFRAA >90 04/23/2014 0428   GFRAA 53* 07/10/2013 1001    No results found for this basename: SPEP, UPEP,  kappa and lambda light chains    Lab Results  Component Value Date   WBC 0.8* 05/02/2014   NEUTROABS 0.5* 05/02/2014   HGB 9.1* 05/02/2014   HCT 26.6* 05/02/2014   MCV 90.8 05/02/2014   PLT 41* 05/02/2014      Chemistry      Component Value Date/Time   NA 139 05/02/2014 0934   NA 140 04/23/2014 0428   K 3.1* 05/02/2014 0934   K 4.3 04/23/2014 0428   CL 99 04/23/2014 0428   CL 105 02/23/2013 1334   CO2 31* 05/02/2014 0934   CO2 22 04/23/2014 0428   BUN 13.4 05/02/2014 0934   BUN 18 04/23/2014 0428   CREATININE 0.9 05/02/2014 0934   CREATININE 0.62 04/23/2014 0428   CREATININE 1.16* 07/10/2013 1001      Component Value Date/Time   CALCIUM 9.3 05/02/2014 0934   CALCIUM 8.3* 04/23/2014 0428   CALCIUM 11.8* 04/04/2012 0420   ALKPHOS 56 05/02/2014 0934   ALKPHOS 39 04/23/2014 0428   AST 14 05/02/2014 0934   AST 12 04/23/2014 0428   ALT 9 05/02/2014 0934   ALT 9 04/23/2014 0428   BILITOT 0.62 05/02/2014 0934   BILITOT 0.4 04/23/2014 0428      ASSESSMENT & PLAN:  Multiple myeloma Her cancer is very aggressive and was causing malignant hypercalcemia. She was recently admitted to the hospital and received one cycle of chemotherapy. 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 I have asked my research assistant about eligibility for clinical trial. Clinically, she appears to have responded to recent chemotherapy. Would continue with aggressive supportive care.   Thrombocytopenia, unspecified This is likely due to recent treatment. The patient denies recent history of bleeding such as epistaxis, hematuria or  hematochezia. She is asymptomatic from the low platelet count. I will  observe for now.  she does not require transfusion now.  Hypercalcemia This had resolved. I will monitor her calcium level weekly.  Mucositis (ulcerative) due to antineoplastic therapy I will prescribe magic mouthwash.  Lymphadenopathy, axillary This had regressed in size. Continue close monitoring.  Hypokalemia This is due to recent chemotherapy. Observe only for now.  Dysuria I am concerned about urinary tract infection. I gave her a prescription of ciprofloxacin to take for one week.  Leukopenia due to antineoplastic chemotherapy Clinically, she is not symptomatic. She had received Neulasta last week. I will observe only for now.   No orders of the defined types were placed in this encounter.   All questions were answered. The patient knows to call the clinic with any problems, questions or concerns. No barriers to learning was detected. I spent 30 minutes counseling the patient face to face. The total time spent in the appointment was 40 minutes and more than 50% was on counseling and review of test results     [place] Day, [place], MD 05/02/2014 11:15 PM

## 2014-05-02 NOTE — Assessment & Plan Note (Signed)
Her cancer is very aggressive and was causing malignant hypercalcemia. She was recently admitted to the hospital and received one cycle of chemotherapy. 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 I have asked my research assistant about eligibility for clinical trial. Clinically, she appears to have responded to recent chemotherapy. Would continue with aggressive supportive care.

## 2014-05-02 NOTE — Assessment & Plan Note (Signed)
This had regressed in size. Continue close monitoring.

## 2014-05-02 NOTE — Telephone Encounter (Signed)
gv and printed appt sched and avs for pt for Aug °

## 2014-05-02 NOTE — Assessment & Plan Note (Signed)
This is likely due to recent treatment. The patient denies recent history of bleeding such as epistaxis, hematuria or hematochezia. She is asymptomatic from the low platelet count. I will observe for now.  she does not require transfusion now.  

## 2014-05-02 NOTE — Assessment & Plan Note (Signed)
Clinically, she is not symptomatic. She had received Neulasta last week. I will observe only for now.

## 2014-05-07 ENCOUNTER — Other Ambulatory Visit: Payer: Self-pay | Admitting: Hematology and Oncology

## 2014-05-09 ENCOUNTER — Ambulatory Visit (HOSPITAL_BASED_OUTPATIENT_CLINIC_OR_DEPARTMENT_OTHER): Payer: Medicare Other | Admitting: Hematology and Oncology

## 2014-05-09 ENCOUNTER — Telehealth: Payer: Self-pay | Admitting: *Deleted

## 2014-05-09 ENCOUNTER — Encounter: Payer: Self-pay | Admitting: Hematology and Oncology

## 2014-05-09 ENCOUNTER — Encounter: Payer: Self-pay | Admitting: *Deleted

## 2014-05-09 ENCOUNTER — Other Ambulatory Visit (HOSPITAL_BASED_OUTPATIENT_CLINIC_OR_DEPARTMENT_OTHER): Payer: Medicare Other

## 2014-05-09 ENCOUNTER — Other Ambulatory Visit: Payer: Self-pay | Admitting: Hematology and Oncology

## 2014-05-09 VITALS — BP 156/84 | HR 100 | Temp 97.9°F | Resp 18 | Ht 66.0 in | Wt 148.4 lb

## 2014-05-09 DIAGNOSIS — G4700 Insomnia, unspecified: Secondary | ICD-10-CM

## 2014-05-09 DIAGNOSIS — D649 Anemia, unspecified: Secondary | ICD-10-CM

## 2014-05-09 DIAGNOSIS — E876 Hypokalemia: Secondary | ICD-10-CM

## 2014-05-09 DIAGNOSIS — C9000 Multiple myeloma not having achieved remission: Secondary | ICD-10-CM

## 2014-05-09 DIAGNOSIS — C8590 Non-Hodgkin lymphoma, unspecified, unspecified site: Secondary | ICD-10-CM

## 2014-05-09 DIAGNOSIS — M8708 Idiopathic aseptic necrosis of bone, other site: Secondary | ICD-10-CM

## 2014-05-09 DIAGNOSIS — R0781 Pleurodynia: Secondary | ICD-10-CM

## 2014-05-09 DIAGNOSIS — M87180 Osteonecrosis due to drugs, jaw: Secondary | ICD-10-CM

## 2014-05-09 DIAGNOSIS — R599 Enlarged lymph nodes, unspecified: Secondary | ICD-10-CM

## 2014-05-09 DIAGNOSIS — R590 Localized enlarged lymph nodes: Secondary | ICD-10-CM

## 2014-05-09 LAB — COMPREHENSIVE METABOLIC PANEL
ALBUMIN: 2.8 g/dL — AB (ref 3.5–5.2)
ALK PHOS: 87 U/L (ref 39–117)
ALT: 62 U/L — AB (ref 0–35)
AST: 33 U/L (ref 0–37)
BUN: 19 mg/dL (ref 6–23)
CO2: 29 mEq/L (ref 19–32)
Calcium: 9.4 mg/dL (ref 8.4–10.5)
Chloride: 98 mEq/L (ref 96–112)
Creatinine, Ser: 1.02 mg/dL (ref 0.50–1.10)
Glucose, Bld: 148 mg/dL — ABNORMAL HIGH (ref 70–99)
Potassium: 2.9 mEq/L — ABNORMAL LOW (ref 3.5–5.3)
Sodium: 141 mEq/L (ref 135–145)
Total Bilirubin: 0.3 mg/dL (ref 0.2–1.2)
Total Protein: 5.7 g/dL — ABNORMAL LOW (ref 6.0–8.3)

## 2014-05-09 LAB — CBC WITH DIFFERENTIAL/PLATELET
BASO%: 0.7 % (ref 0.0–2.0)
Basophils Absolute: 0.2 10*3/uL — ABNORMAL HIGH (ref 0.0–0.1)
EOS%: 0.1 % (ref 0.0–7.0)
Eosinophils Absolute: 0 10*3/uL (ref 0.0–0.5)
HCT: 30.5 % — ABNORMAL LOW (ref 34.8–46.6)
HGB: 10.1 g/dL — ABNORMAL LOW (ref 11.6–15.9)
LYMPH%: 5.7 % — ABNORMAL LOW (ref 14.0–49.7)
MCH: 30.8 pg (ref 25.1–34.0)
MCHC: 33 g/dL (ref 31.5–36.0)
MCV: 93.3 fL (ref 79.5–101.0)
MONO#: 0.8 10*3/uL (ref 0.1–0.9)
MONO%: 4 % (ref 0.0–14.0)
NEUT#: 18.8 10*3/uL — ABNORMAL HIGH (ref 1.5–6.5)
NEUT%: 89.5 % — ABNORMAL HIGH (ref 38.4–76.8)
Platelets: 410 10*3/uL — ABNORMAL HIGH (ref 145–400)
RBC: 3.27 10*6/uL — AB (ref 3.70–5.45)
RDW: 16 % — AB (ref 11.2–14.5)
WBC: 21 10*3/uL — AB (ref 3.9–10.3)
lymph#: 1.2 10*3/uL (ref 0.9–3.3)

## 2014-05-09 LAB — LACTATE DEHYDROGENASE (CC13): LDH: 358 U/L — AB (ref 125–245)

## 2014-05-09 LAB — URIC ACID (CC13): Uric Acid, Serum: 2.8 mg/dl (ref 2.6–7.4)

## 2014-05-09 MED ORDER — POTASSIUM CHLORIDE CRYS ER 20 MEQ PO TBCR: 20.0000 meq | EXTENDED_RELEASE_TABLET | Freq: Two times a day (BID) | ORAL | Status: AC

## 2014-05-09 NOTE — Assessment & Plan Note (Signed)
Her rib pain has resolved and clinically, the palpable mass is no longer palpable.

## 2014-05-09 NOTE — Telephone Encounter (Signed)
Pt notified to take prescription potassium twice a day for 1 week as potassium is low. Also instructed to drink fruit juices. Verbalized understanding

## 2014-05-09 NOTE — Assessment & Plan Note (Addendum)
IV pamidronate is contraindicated. She will continue oral dental care with a dentist.

## 2014-05-09 NOTE — Progress Notes (Signed)
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    04/08/2012 Bone Marrow Biopsy BM biopsy showed 92% plasma cell, loss chromosome 8 and 13, addition of chromosome 8p, 14q and Myeloma FISH showed 13q and extra chromosome 14   04/12/2012 - 10/20/2012 Chemotherapy Velcade 3 weeks on 1 week off, Revlimid 15 mg PO days 1-21 days, dexamethasone 40 mg weekly.    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   01/30/2014 Procedure She has placement of Infuse-a-Port   02/01/2014 - 03/01/2014 Chemotherapy Due to progression of disease, Velcade was added back. Revlimid doses reduced to 5 mg, 21 days on 7 days off. Treatment was discontinued due to new palpable lymphadenopathy.   03/08/2014 Imaging Increased size of large right lateral chest wall soft tissue mass with destruction of the left lateral seventh rib. New lymphadenopathy in the right lateral chest wall, right axilla, and right cardiophrenic angle.     04/11/2014 Surgery She underwent excisional lymph node  biopsy.   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   04/19/2014 - 04/24/2014 Hospital Admission The patient was admitted to the hospital for cycle 1 of chemotherapy for malignant hypercalcemia.   05/01/2014 Pathology Results I reviewed her case at the hematology tumor board. 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    INTERVAL HISTORY: Please see below for problem oriented charting. She returns today for further followup. Overall, she is feeling well. The prior rib pain has resolved. Her energy level is improving and she is hungry. Mucositis has resolved. Prior palpable lymphadenopathy in her arm is regressed. Her prior dysuria has resolved and she has no fever.  REVIEW OF SYSTEMS:   Constitutional: Denies fevers, chills or abnormal weight loss Eyes: Denies blurriness of vision Ears, nose, mouth, throat, and face: Denies mucositis or sore throat Respiratory: Denies cough, dyspnea or wheezes Cardiovascular: Denies palpitation, chest discomfort or lower extremity swelling Gastrointestinal:  Denies nausea, heartburn or change in bowel habits Skin: Denies abnormal skin rashes Lymphatics: Denies new lymphadenopathy or easy bruising Neurological:Denies numbness, tingling or new weaknesses Behavioral/Psych: Mood is stable, no new changes  All other systems were reviewed with the patient and are negative.  I have reviewed the past medical history, past surgical history, social history and family history with the patient and they are unchanged from previous note.  ALLERGIES:  is allergic to simvastatin and zometa.  MEDICATIONS:  Current Outpatient Prescriptions  Medication Sig Dispense Refill  . acyclovir (ZOVIRAX) 400 MG tablet Take  400 mg by mouth daily.      Marland Kitchen allopurinol (ZYLOPRIM) 300 MG tablet Take 1 tablet (300 mg total) by mouth daily.  30 tablet  0  . ALPRAZolam (XANAX) 1 MG tablet  Take 1-2 tablets (1-2 mg total) by mouth at bedtime as needed for anxiety.  60 tablet  2  . Alum & Mag Hydroxide-Simeth (MAGIC MOUTHWASH W/LIDOCAINE) SOLN Take 5 mLs by mouth 3 (three) times daily.  240 mL  0  . aspirin EC 81 MG tablet Take 81 mg by mouth daily.      . Calcium Carbonate-Vitamin D (CALCIUM 500 + D PO) Take 1 tablet by mouth 2 (two) times daily.      Marland Kitchen lidocaine-prilocaine (EMLA) cream Apply 1 application topically daily as needed. Port      . Multiple Vitamin (MULTIVITAMIN WITH MINERALS) TABS Take 1 tablet by mouth daily.      Marland Kitchen oxyCODONE (ROXICODONE) 5 MG immediate release tablet Take 1 tablet (5 mg total) by mouth every 4 (four) hours as needed for severe pain.  60 tablet  0  . oxyCODONE-acetaminophen (PERCOCET/ROXICET) 5-325 MG per tablet Take 1 tablet by mouth every 4 (four) hours as needed for moderate pain or severe pain.      . rosuvastatin (CRESTOR) 10 MG tablet Take 10 mg by mouth daily.      Marland Kitchen tetrahydrozoline 0.05 % ophthalmic solution Place 1 drop into both eyes 2 (two) times daily as needed (redness/dry eyes).      . traZODone (DESYREL) 50 MG tablet Take 1 tablet (50 mg total) by mouth at bedtime as needed for sleep.  90 tablet  3  . [DISCONTINUED] prochlorperazine (COMPAZINE) 10 MG tablet Take 1 tablet (10 mg total) by mouth every 6 (six) hours as needed (Nausea or vomiting).  30 tablet  1  . losartan (COZAAR) 50 MG tablet Take 50 mg by mouth daily.      . predniSONE (DELTASONE) 50 MG tablet        No current facility-administered medications for this visit.    PHYSICAL EXAMINATION: ECOG PERFORMANCE STATUS: 0 - Asymptomatic  Filed Vitals:   05/09/14 0932  BP: 156/84  Pulse: 100  Temp: 97.9 F (36.6 C)  Resp: 18   Filed Weights   05/09/14 0932  Weight: 148 lb 6.4 oz (67.314 kg)    GENERAL:alert, no distress and comfortable SKIN: skin color, texture, turgor are normal, no rashes or significant lesions EYES: normal, Conjunctiva are pink and non-injected,  sclera clear OROPHARYNX:no exudate, no erythema and lips, buccal mucosa, and tongue normal . Poor dentition is noted. NECK: supple, thyroid normal size, non-tender, without nodularity LYMPH:  no palpable lymphadenopathy in the cervical, axillary or inguinal LUNGS: clear to auscultation and percussion with normal breathing effort HEART: regular rate & rhythm and no murmurs and no lower extremity edema ABDOMEN:abdomen soft, non-tender and normal bowel sounds Musculoskeletal:no cyanosis of digits and no clubbing  NEURO: alert & oriented x 3 with fluent speech, no focal motor/sensory deficits  LABORATORY DATA:  I have reviewed the data as listed    Component Value Date/Time   NA 139 05/02/2014 0934   NA 140 04/23/2014 0428   K 3.1* 05/02/2014 0934   K 4.3 04/23/2014 0428   CL 99 04/23/2014 0428   CL 105 02/23/2013 1334   CO2 31* 05/02/2014 0934   CO2 22 04/23/2014 0428   GLUCOSE 180* 05/02/2014 0934   GLUCOSE 104* 04/23/2014 0428   GLUCOSE 83 02/23/2013 1334  GLUCOSE 96 09/30/2006 1322   BUN 13.4 05/02/2014 0934   BUN 18 04/23/2014 0428   CREATININE 0.9 05/02/2014 0934   CREATININE 0.62 04/23/2014 0428   CREATININE 1.16* 07/10/2013 1001   CALCIUM 9.3 05/02/2014 0934   CALCIUM 8.3* 04/23/2014 0428   CALCIUM 11.8* 04/04/2012 0420   PROT 5.7* 05/02/2014 0934   PROT 5.7* 04/23/2014 0428   ALBUMIN 2.4* 05/02/2014 0934   ALBUMIN 2.6* 04/23/2014 0428   AST 14 05/02/2014 0934   AST 12 04/23/2014 0428   ALT 9 05/02/2014 0934   ALT 9 04/23/2014 0428   ALKPHOS 56 05/02/2014 0934   ALKPHOS 39 04/23/2014 0428   BILITOT 0.62 05/02/2014 0934   BILITOT 0.4 04/23/2014 0428   GFRNONAA 85* 04/23/2014 0428   GFRNONAA 46* 07/10/2013 1001   GFRAA >90 04/23/2014 0428   GFRAA 53* 07/10/2013 1001    No results found for this basename: SPEP, UPEP,  kappa and lambda light chains    Lab Results  Component Value Date   WBC 21.0* 05/09/2014   NEUTROABS 18.8* 05/09/2014   HGB 10.1* 05/09/2014   HCT 30.5* 05/09/2014   MCV 93.3 05/09/2014    PLT 410* 05/09/2014      Chemistry      Component Value Date/Time   NA 139 05/02/2014 0934   NA 140 04/23/2014 0428   K 3.1* 05/02/2014 0934   K 4.3 04/23/2014 0428   CL 99 04/23/2014 0428   CL 105 02/23/2013 1334   CO2 31* 05/02/2014 0934   CO2 22 04/23/2014 0428   BUN 13.4 05/02/2014 0934   BUN 18 04/23/2014 0428   CREATININE 0.9 05/02/2014 0934   CREATININE 0.62 04/23/2014 0428   CREATININE 1.16* 07/10/2013 1001      Component Value Date/Time   CALCIUM 9.3 05/02/2014 0934   CALCIUM 8.3* 04/23/2014 0428   CALCIUM 11.8* 04/04/2012 0420   ALKPHOS 56 05/02/2014 0934   ALKPHOS 39 04/23/2014 0428   AST 14 05/02/2014 0934   AST 12 04/23/2014 0428   ALT 9 05/02/2014 0934   ALT 9 04/23/2014 0428   BILITOT 0.62 05/02/2014 0934   BILITOT 0.4 04/23/2014 0428      ASSESSMENT & PLAN:  Multiple myeloma Clinically, she had excellent response to recent chemotherapy. I asked my research assistant and she appears to be eligible to get enrolled in clinical trial [id number] which is an expanded access, relapsed/refractory, at least one prior regimen for multiple myeloma using Elotuzumab, dexamethasone and lenalidomide. I discussed with the patient the risks, benefits, side effects of chemotherapy in general using the stroke and she agreed to proceed. We will attempt enrollment on clinical trial to start next week on 05/18/2014 and I will see her on 05/17/2014.  Rib pain Her rib pain has resolved and clinically, the palpable mass is no longer palpable.  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 She will continue oral dental care with a dentist.    Normocytic anemia This is likely anemia of chronic disease and recent chemotherapy. The patient denies recent history of bleeding such as epistaxis, hematuria or hematochezia. She is asymptomatic from the anemia. We will observe for now.  She does not require transfusion now.      Lymphadenopathy, axillary This is no longer palpable and I suspect  she had excellent response to recent chemotherapy.   No orders of the defined types were placed in this encounter.   All questions were answered. The patient knows to call the  clinic with any problems, questions or concerns. No barriers to learning was detected. I spent 40 minutes counseling the patient face to face. The total time spent in the appointment was 55 minutes and more than 50% was on counseling and review of test results     [employer] [employer] [facility], [place], MD 05/09/2014 10:53 AM

## 2014-05-09 NOTE — Assessment & Plan Note (Signed)
Clinically, she had excellent response to recent chemotherapy. I asked my research assistant and she appears to be eligible to get enrolled in clinical trial [id number] which is an expanded access, relapsed/refractory, at least one prior regimen for multiple myeloma using Elotuzumab, dexamethasone and lenalidomide. I discussed with the patient the risks, benefits, side effects of chemotherapy in general using the stroke and she agreed to proceed. We will attempt enrollment on clinical trial to start next week on 05/18/2014 and I will see her on 05/17/2014.

## 2014-05-09 NOTE — Assessment & Plan Note (Signed)
This is likely anemia of chronic disease and recent chemotherapy. The patient denies recent history of bleeding such as epistaxis, hematuria or hematochezia. She is asymptomatic from the anemia. We will observe for now.  She does not require transfusion now.

## 2014-05-09 NOTE — Assessment & Plan Note (Signed)
This is no longer palpable and I suspect she had excellent response to recent chemotherapy.

## 2014-05-09 NOTE — Progress Notes (Signed)
05/09/2014 [name] reports that she does have some numbness and tingling in her fingers. It has affected her writing but she is able to perform ADLs including buttoning clothes and managing zippers.

## 2014-05-10 ENCOUNTER — Other Ambulatory Visit: Payer: Self-pay | Admitting: *Deleted

## 2014-05-10 ENCOUNTER — Encounter: Payer: Self-pay | Admitting: *Deleted

## 2014-05-10 MED ORDER — DEXAMETHASONE 4 MG PO TABS: 28.0000 mg | ORAL_TABLET | ORAL | Status: DC

## 2014-05-10 MED ORDER — LENALIDOMIDE 10 MG PO CAPS: 10.0000 mg | ORAL_CAPSULE | Freq: Every day | ORAL | Status: DC

## 2014-05-11 ENCOUNTER — Other Ambulatory Visit: Payer: Self-pay | Admitting: Hematology and Oncology

## 2014-05-11 ENCOUNTER — Telehealth: Payer: Self-pay | Admitting: *Deleted

## 2014-05-11 ENCOUNTER — Encounter: Payer: Self-pay | Admitting: Hematology and Oncology

## 2014-05-11 DIAGNOSIS — C9000 Multiple myeloma not having achieved remission: Secondary | ICD-10-CM

## 2014-05-11 DIAGNOSIS — J910 Malignant pleural effusion: Secondary | ICD-10-CM | POA: Insufficient documentation

## 2014-05-11 DIAGNOSIS — J90 Pleural effusion, not elsewhere classified: Secondary | ICD-10-CM

## 2014-05-11 HISTORY — DX: Pleural effusion, not elsewhere classified: J90

## 2014-05-11 NOTE — Telephone Encounter (Signed)
Per research RN I have scheduled appt.

## 2014-05-12 ENCOUNTER — Telehealth: Payer: Self-pay | Admitting: *Deleted

## 2014-05-12 NOTE — Telephone Encounter (Signed)
Message copied by Cathlean Cower on Sat May 12, 2014  1:21 PM ------      Message from: [facility], [place]      Created: Fri May 11, 2014  7:34 PM      Regarding: CXR       FYI I ordered one for next week just to check on the status of pleural effusion. Please let her know to walk in before Rx on Friday ------

## 2014-05-12 NOTE — Telephone Encounter (Signed)
Informed husband of CXR ordered by Dr. Alvy Bimler.  Instructed for pt to come an hour early Friday 8/21 prior to her chemo appt.  Go to [facility] Radiology dept around 8 am for CXR.  He verbalized understanding.

## 2014-05-14 ENCOUNTER — Other Ambulatory Visit: Payer: Self-pay | Admitting: *Deleted

## 2014-05-14 ENCOUNTER — Other Ambulatory Visit: Payer: Self-pay | Admitting: Hematology and Oncology

## 2014-05-14 ENCOUNTER — Telehealth: Payer: Self-pay | Admitting: *Deleted

## 2014-05-14 ENCOUNTER — Telehealth: Payer: Self-pay | Admitting: Hematology and Oncology

## 2014-05-14 DIAGNOSIS — C9000 Multiple myeloma not having achieved remission: Secondary | ICD-10-CM

## 2014-05-14 NOTE — Telephone Encounter (Signed)
Informed husband of appt for lab and to go for CXR on 8/20 and Chemo on 8/21.  He verbalized understanding.

## 2014-05-14 NOTE — Telephone Encounter (Signed)
Message copied by Cathlean Cower on Mon May 14, 2014  2:44 PM ------      Message from: [facility], [place]: Mon May 14, 2014 10:44 AM      Regarding: RE: office visit 8/20?        She needs lab appt and CXR on 8/20, chemo weekly start 8/21.      See me 8/28 at 9 am, 30 mins. Thanks      ----- Message -----         From: Cathlean Cower, RN         Sent: 05/14/2014  10:40 AM           To: Heath Lark, MD      Subject: office visit 8/20?                                       Research RN says you want to see pt w/ lab this week on 8/20? I don't see the order.  She is scheduled for treatment on 8/21.  Thanks.        ------

## 2014-05-14 NOTE — Telephone Encounter (Signed)
s.w.l pt and advised on Aug appt....pt ok adn aware...per staff msg add md visit and chemo weekly.

## 2014-05-14 NOTE — Telephone Encounter (Signed)
Instructed pt to bring pt on Thursday 8/20 for lab at 8 am at [facility] and the go to Radiology at [facility] for CXR.  Chemo on Friday 8/21 as scheduled.  He verbalized understanding.

## 2014-05-15 ENCOUNTER — Other Ambulatory Visit: Payer: Self-pay | Admitting: Family Medicine

## 2014-05-15 NOTE — Telephone Encounter (Signed)
RECEIVED A FAX FROM [facility]OLOG[facility] CONCERNING A CONFIRMATION OF PRESCRIPTION SHIPMENT FOR REVLIMID ON 05/14/14. 

## 2014-05-17 ENCOUNTER — Ambulatory Visit (HOSPITAL_COMMUNITY)
Admission: RE | Admit: 2014-05-17 | Discharge: 2014-05-17 | Disposition: A | Discharge status: Home / Self Care | Payer: Medicare Other | Source: Ambulatory Visit | Attending: Hematology and Oncology | Admitting: Hematology and Oncology

## 2014-05-17 ENCOUNTER — Other Ambulatory Visit: Payer: Self-pay | Admitting: *Deleted

## 2014-05-17 ENCOUNTER — Other Ambulatory Visit (HOSPITAL_BASED_OUTPATIENT_CLINIC_OR_DEPARTMENT_OTHER): Payer: Medicare Other

## 2014-05-17 ENCOUNTER — Encounter: Payer: Self-pay | Admitting: *Deleted

## 2014-05-17 DIAGNOSIS — C8590 Non-Hodgkin lymphoma, unspecified, unspecified site: Secondary | ICD-10-CM

## 2014-05-17 DIAGNOSIS — J90 Pleural effusion, not elsewhere classified: Secondary | ICD-10-CM | POA: Diagnosis present

## 2014-05-17 DIAGNOSIS — C9000 Multiple myeloma not having achieved remission: Secondary | ICD-10-CM | POA: Insufficient documentation

## 2014-05-17 DIAGNOSIS — D649 Anemia, unspecified: Secondary | ICD-10-CM

## 2014-05-17 DIAGNOSIS — G4700 Insomnia, unspecified: Secondary | ICD-10-CM

## 2014-05-17 LAB — COMPREHENSIVE METABOLIC PANEL (CC13)
ALBUMIN: 2.8 g/dL — AB (ref 3.5–5.0)
ALT: 20 U/L (ref 0–55)
ANION GAP: 10 meq/L (ref 3–11)
AST: 15 U/L (ref 5–34)
Alkaline Phosphatase: 59 U/L (ref 40–150)
BUN: 14.5 mg/dL (ref 7.0–26.0)
CALCIUM: 10.2 mg/dL (ref 8.4–10.4)
CO2: 26 meq/L (ref 22–29)
Chloride: 104 mEq/L (ref 98–109)
Creatinine: 1 mg/dL (ref 0.6–1.1)
Glucose: 93 mg/dl (ref 70–140)
POTASSIUM: 4.4 meq/L (ref 3.5–5.1)
Sodium: 139 mEq/L (ref 136–145)
Total Bilirubin: 0.5 mg/dL (ref 0.20–1.20)
Total Protein: 5.6 g/dL — ABNORMAL LOW (ref 6.4–8.3)

## 2014-05-17 LAB — CBC WITH DIFFERENTIAL/PLATELET
BASO%: 0.2 % (ref 0.0–2.0)
Basophils Absolute: 0 10*3/uL (ref 0.0–0.1)
EOS ABS: 0.1 10*3/uL (ref 0.0–0.5)
EOS%: 1.1 % (ref 0.0–7.0)
HCT: 32.3 % — ABNORMAL LOW (ref 34.8–46.6)
HGB: 10.4 g/dL — ABNORMAL LOW (ref 11.6–15.9)
LYMPH%: 16.4 % (ref 14.0–49.7)
MCH: 31.5 pg (ref 25.1–34.0)
MCHC: 32.2 g/dL (ref 31.5–36.0)
MCV: 97.9 fL (ref 79.5–101.0)
MONO#: 0.9 10*3/uL (ref 0.1–0.9)
MONO%: 14.8 % — ABNORMAL HIGH (ref 0.0–14.0)
NEUT%: 67.5 % (ref 38.4–76.8)
NEUTROS ABS: 4.2 10*3/uL (ref 1.5–6.5)
Platelets: 256 10*3/uL (ref 145–400)
RBC: 3.3 10*6/uL — AB (ref 3.70–5.45)
RDW: 18.1 % — ABNORMAL HIGH (ref 11.2–14.5)
WBC: 6.2 10*3/uL (ref 3.9–10.3)
lymph#: 1 10*3/uL (ref 0.9–3.3)

## 2014-05-17 LAB — LACTATE DEHYDROGENASE (CC13): LDH: 260 U/L — ABNORMAL HIGH (ref 125–245)

## 2014-05-17 LAB — URIC ACID (CC13): Uric Acid, Serum: 3.6 mg/dl (ref 2.6–7.4)

## 2014-05-17 NOTE — Progress Notes (Signed)
05/17/14 Patient enrolled in the [facility] [id number] myeloma clinical trial. Spoke with [name] today to remind her to take the decadron, 28 mg at 6AM 8/21 before elotuzumab. Take with food. Told her to begin the revlimid Friday evening. She and her husband voiced understanding.

## 2014-05-18 ENCOUNTER — Encounter: Payer: Self-pay | Admitting: *Deleted

## 2014-05-18 ENCOUNTER — Ambulatory Visit (HOSPITAL_BASED_OUTPATIENT_CLINIC_OR_DEPARTMENT_OTHER): Payer: Medicare Other

## 2014-05-18 ENCOUNTER — Other Ambulatory Visit: Payer: Self-pay | Admitting: Hematology and Oncology

## 2014-05-18 VITALS — BP 108/71 | HR 93 | Temp 97.9°F | Resp 18 | Wt 144.0 lb

## 2014-05-18 DIAGNOSIS — Z5112 Encounter for antineoplastic immunotherapy: Secondary | ICD-10-CM

## 2014-05-18 DIAGNOSIS — C9000 Multiple myeloma not having achieved remission: Secondary | ICD-10-CM

## 2014-05-18 MED ORDER — ACETAMINOPHEN 325 MG PO TABS
ORAL_TABLET | ORAL | Status: AC
  Filled 2014-05-18: qty 2

## 2014-05-18 MED ORDER — DIPHENHYDRAMINE HCL 25 MG PO CAPS
ORAL_CAPSULE | ORAL | Status: AC
  Filled 2014-05-18: qty 2

## 2014-05-18 MED ORDER — HEPARIN SOD (PORK) LOCK FLUSH 100 UNIT/ML IV SOLN
500.0000 [IU] | Freq: Once | INTRAVENOUS | Status: AC | PRN
  Administered 2014-05-18: 500 [IU]
  Filled 2014-05-18: qty 5

## 2014-05-18 MED ORDER — DEXAMETHASONE SODIUM PHOSPHATE 10 MG/ML IJ SOLN
INTRAMUSCULAR | Status: AC
  Filled 2014-05-18: qty 1

## 2014-05-18 MED ORDER — FAMOTIDINE IN NACL 20-0.9 MG/50ML-% IV SOLN
20.0000 mg | Freq: Once | INTRAVENOUS | Status: AC
  Administered 2014-05-18: 20 mg via INTRAVENOUS

## 2014-05-18 MED ORDER — FAMOTIDINE IN NACL 20-0.9 MG/50ML-% IV SOLN
INTRAVENOUS | Status: AC
  Filled 2014-05-18: qty 50

## 2014-05-18 MED ORDER — SODIUM CHLORIDE 0.9 % IJ SOLN
10.0000 mL | INTRAMUSCULAR | Status: DC | PRN
  Administered 2014-05-18: 10 mL
  Filled 2014-05-18: qty 10

## 2014-05-18 MED ORDER — DEXAMETHASONE SODIUM PHOSPHATE 10 MG/ML IJ SOLN
8.0000 mg | Freq: Once | INTRAMUSCULAR | Status: AC
  Administered 2014-05-18: 8 mg via INTRAVENOUS

## 2014-05-18 MED ORDER — ACETAMINOPHEN 325 MG PO TABS
650.0000 mg | ORAL_TABLET | Freq: Once | ORAL | Status: AC
  Administered 2014-05-18: 650 mg via ORAL

## 2014-05-18 MED ORDER — DIPHENHYDRAMINE HCL 25 MG PO CAPS
50.0000 mg | ORAL_CAPSULE | Freq: Once | ORAL | Status: AC
  Administered 2014-05-18: 50 mg via ORAL

## 2014-05-18 MED ORDER — INV-ELOTUZUMAB CHEMO INJECTION 400MG BMS CA204-143
10.0000 mg/kg | Freq: Once | INTRAMUSCULAR | Status: AC
  Administered 2014-05-18: 655 mg via INTRAVENOUS
  Filled 2014-05-18: qty 26.2

## 2014-05-18 MED ORDER — SODIUM CHLORIDE 0.9 % IV SOLN
Freq: Once | INTRAVENOUS | Status: AC
  Administered 2014-05-18: 09:00:00 via INTRAVENOUS

## 2014-05-18 NOTE — Patient Instructions (Signed)
[facility] Discharge Instructions for Patients Receiving Chemotherapy  Today you received the following chemotherapy agents ; Elotuzumab  To help prevent nausea and vomiting after your treatment, we encourage you to take your nausea medication as directed.    If you develop nausea and vomiting that is not controlled by your nausea medication, call the clinic.   BELOW ARE SYMPTOMS THAT SHOULD BE REPORTED IMMEDIATELY:  *FEVER GREATER THAN 100.5 F  *CHILLS WITH OR WITHOUT FEVER  NAUSEA AND VOMITING THAT IS NOT CONTROLLED WITH YOUR NAUSEA MEDICATION  *UNUSUAL SHORTNESS OF BREATH  *UNUSUAL BRUISING OR BLEEDING  TENDERNESS IN MOUTH AND THROAT WITH OR WITHOUT PRESENCE OF ULCERS  *URINARY PROBLEMS  *BOWEL PROBLEMS  UNUSUAL RASH Items with * indicate a potential emergency and should be followed up as soon as possible.  Feel free to call the clinic you have any questions or concerns. The clinic phone number is [phone].    

## 2014-05-18 NOTE — Progress Notes (Signed)
05/18/14 BMS [id number], cycle 1, day 1 Mrs. Kilty is in the cancer center today for Cycle 1, day 1 treatment on the BMS myeloma study. She is accompanied by her husband.  Her weight today is 65.3 Kg. The dose of elotuzumab for cycle 1 is calculated to be 655 mg.  She confirms she took 7 tablets of decadron this morning at 6AM. She reports taking the first dose of revlimid last evening at 7:00. I reminded her that she had been instructed to begin revlimid on Friday, day 1 of the cycle.  Her husband expressed surprise that she had begun the medicine. This is a protocol deviation.  She was given a copy of the patient calendar.  Sign for infusion given to L. Chestnutt, RN with instruction about the infusion schedule for the first dose and  timing of the pre-medications in relation to the elotuzumab. Information also provided about grading and response for infusion reactions.

## 2014-05-21 ENCOUNTER — Other Ambulatory Visit: Payer: Self-pay | Admitting: Hematology and Oncology

## 2014-05-21 ENCOUNTER — Telehealth: Payer: Self-pay | Admitting: *Deleted

## 2014-05-21 LAB — SPEP & IFE WITH QIG
ALBUMIN ELP: 55.3 % — AB (ref 55.8–66.1)
ALPHA-2-GLOBULIN: 14.7 % — AB (ref 7.1–11.8)
Alpha-1-Globulin: 6.3 % — ABNORMAL HIGH (ref 2.9–4.9)
BETA GLOBULIN: 11.1 % — AB (ref 4.7–7.2)
Beta 2: 4.5 % (ref 3.2–6.5)
Gamma Globulin: 8.1 % — ABNORMAL LOW (ref 11.1–18.8)
IgA: 158 mg/dL (ref 69–380)
IgG (Immunoglobin G), Serum: 401 mg/dL — ABNORMAL LOW (ref 690–1700)
IgM, Serum: 9 mg/dL — ABNORMAL LOW (ref 52–322)
M-Spike, %: 0.38 g/dL
TOTAL PROTEIN, SERUM ELECTROPHOR: 5.3 g/dL — AB (ref 6.0–8.3)

## 2014-05-21 LAB — KAPPA/LAMBDA LIGHT CHAINS
Kappa free light chain: 2.26 mg/dL — ABNORMAL HIGH (ref 0.33–1.94)
Kappa:Lambda Ratio: 3.37 — ABNORMAL HIGH (ref 0.26–1.65)
LAMBDA FREE LGHT CHN: 0.67 mg/dL (ref 0.57–2.63)

## 2014-05-21 LAB — BETA 2 MICROGLOBULIN, SERUM: Beta-2 Microglobulin: 5.24 mg/L — ABNORMAL HIGH (ref <=2.51)

## 2014-05-21 NOTE — Telephone Encounter (Signed)
PT. IS EATING AND FORCING FLUIDS. NO NAUSEA OR VOMITING. NO DIARRHEA OR CONSTIPATION. SHE HAD A GOOD BOWEL MOVEMENT TODAY. NO MOUTH ISSUES. NO PROBLEMS AT THIS TIME. INSTRUCTED PT. TO CALL THIS OFFICE IF ANY PROBLEMS ARISE AT ANY TIME. THERE IS A PHYSICIAN ON CALL 24/7. SHE VOICES UNDERSTANDING.

## 2014-05-23 ENCOUNTER — Telehealth: Payer: Self-pay | Admitting: *Deleted

## 2014-05-23 NOTE — Telephone Encounter (Signed)
Pt states she missed a call from [place].  Informed pt not sure who called her but it may have been appt reminder for Friday.  Pt confirmed her appts for this Friday.

## 2014-05-24 ENCOUNTER — Other Ambulatory Visit: Payer: Self-pay | Admitting: Hematology and Oncology

## 2014-05-24 DIAGNOSIS — C9000 Multiple myeloma not having achieved remission: Secondary | ICD-10-CM

## 2014-05-25 ENCOUNTER — Telehealth: Payer: Self-pay | Admitting: Hematology and Oncology

## 2014-05-25 ENCOUNTER — Telehealth: Payer: Self-pay | Admitting: *Deleted

## 2014-05-25 ENCOUNTER — Other Ambulatory Visit (HOSPITAL_BASED_OUTPATIENT_CLINIC_OR_DEPARTMENT_OTHER): Payer: Medicare Other

## 2014-05-25 ENCOUNTER — Encounter: Payer: Self-pay | Admitting: *Deleted

## 2014-05-25 ENCOUNTER — Ambulatory Visit (HOSPITAL_BASED_OUTPATIENT_CLINIC_OR_DEPARTMENT_OTHER): Payer: Medicare Other

## 2014-05-25 ENCOUNTER — Ambulatory Visit (HOSPITAL_BASED_OUTPATIENT_CLINIC_OR_DEPARTMENT_OTHER): Payer: Medicare Other | Admitting: Hematology and Oncology

## 2014-05-25 VITALS — BP 122/75 | HR 97 | Temp 98.1°F | Resp 17 | Ht 66.0 in | Wt 145.9 lb

## 2014-05-25 DIAGNOSIS — C9000 Multiple myeloma not having achieved remission: Secondary | ICD-10-CM

## 2014-05-25 DIAGNOSIS — E876 Hypokalemia: Secondary | ICD-10-CM

## 2014-05-25 DIAGNOSIS — J90 Pleural effusion, not elsewhere classified: Secondary | ICD-10-CM

## 2014-05-25 DIAGNOSIS — R0781 Pleurodynia: Secondary | ICD-10-CM

## 2014-05-25 DIAGNOSIS — L89309 Pressure ulcer of unspecified buttock, unspecified stage: Secondary | ICD-10-CM

## 2014-05-25 DIAGNOSIS — Z5112 Encounter for antineoplastic immunotherapy: Secondary | ICD-10-CM

## 2014-05-25 DIAGNOSIS — M87180 Osteonecrosis due to drugs, jaw: Secondary | ICD-10-CM

## 2014-05-25 DIAGNOSIS — R0789 Other chest pain: Secondary | ICD-10-CM

## 2014-05-25 DIAGNOSIS — E440 Moderate protein-calorie malnutrition: Secondary | ICD-10-CM

## 2014-05-25 DIAGNOSIS — D649 Anemia, unspecified: Secondary | ICD-10-CM

## 2014-05-25 DIAGNOSIS — L89311 Pressure ulcer of right buttock, stage 1: Secondary | ICD-10-CM

## 2014-05-25 DIAGNOSIS — M8708 Idiopathic aseptic necrosis of bone, other site: Secondary | ICD-10-CM

## 2014-05-25 LAB — COMPREHENSIVE METABOLIC PANEL (CC13)
ALT: 11 U/L (ref 0–55)
AST: 10 U/L (ref 5–34)
Albumin: 2.8 g/dL — ABNORMAL LOW (ref 3.5–5.0)
Alkaline Phosphatase: 60 U/L (ref 40–150)
Anion Gap: 10 mEq/L (ref 3–11)
BUN: 11.2 mg/dL (ref 7.0–26.0)
CALCIUM: 9.5 mg/dL (ref 8.4–10.4)
CHLORIDE: 106 meq/L (ref 98–109)
CO2: 26 mEq/L (ref 22–29)
CREATININE: 0.8 mg/dL (ref 0.6–1.1)
Glucose: 125 mg/dl (ref 70–140)
POTASSIUM: 4.1 meq/L (ref 3.5–5.1)
SODIUM: 142 meq/L (ref 136–145)
Total Bilirubin: 0.69 mg/dL (ref 0.20–1.20)
Total Protein: 5.4 g/dL — ABNORMAL LOW (ref 6.4–8.3)

## 2014-05-25 LAB — CBC WITH DIFFERENTIAL/PLATELET
BASO%: 0.5 % (ref 0.0–2.0)
Basophils Absolute: 0 10*3/uL (ref 0.0–0.1)
EOS%: 7.7 % — AB (ref 0.0–7.0)
Eosinophils Absolute: 0.6 10*3/uL — ABNORMAL HIGH (ref 0.0–0.5)
HCT: 30.7 % — ABNORMAL LOW (ref 34.8–46.6)
HEMOGLOBIN: 10 g/dL — AB (ref 11.6–15.9)
LYMPH%: 3 % — ABNORMAL LOW (ref 14.0–49.7)
MCH: 31.7 pg (ref 25.1–34.0)
MCHC: 32.5 g/dL (ref 31.5–36.0)
MCV: 97.6 fL (ref 79.5–101.0)
MONO#: 0.3 10*3/uL (ref 0.1–0.9)
MONO%: 3.4 % (ref 0.0–14.0)
NEUT#: 6.8 10*3/uL — ABNORMAL HIGH (ref 1.5–6.5)
NEUT%: 85.4 % — AB (ref 38.4–76.8)
Platelets: 140 10*3/uL — ABNORMAL LOW (ref 145–400)
RBC: 3.15 10*6/uL — ABNORMAL LOW (ref 3.70–5.45)
RDW: 19.9 % — AB (ref 11.2–14.5)
WBC: 8 10*3/uL (ref 3.9–10.3)
lymph#: 0.2 10*3/uL — ABNORMAL LOW (ref 0.9–3.3)

## 2014-05-25 MED ORDER — ACETAMINOPHEN 325 MG PO TABS
ORAL_TABLET | ORAL | Status: AC
  Filled 2014-05-25: qty 2

## 2014-05-25 MED ORDER — SODIUM CHLORIDE 0.9 % IJ SOLN
10.0000 mL | INTRAMUSCULAR | Status: DC | PRN
  Administered 2014-05-25: 10 mL
  Filled 2014-05-25: qty 10

## 2014-05-25 MED ORDER — DEXAMETHASONE SODIUM PHOSPHATE 10 MG/ML IJ SOLN
INTRAMUSCULAR | Status: AC
  Filled 2014-05-25: qty 1

## 2014-05-25 MED ORDER — DIPHENHYDRAMINE HCL 25 MG PO CAPS
ORAL_CAPSULE | ORAL | Status: AC
  Filled 2014-05-25: qty 2

## 2014-05-25 MED ORDER — ACETAMINOPHEN 325 MG PO TABS
650.0000 mg | ORAL_TABLET | Freq: Once | ORAL | Status: AC
  Administered 2014-05-25: 650 mg via ORAL

## 2014-05-25 MED ORDER — HEPARIN SOD (PORK) LOCK FLUSH 100 UNIT/ML IV SOLN
500.0000 [IU] | Freq: Once | INTRAVENOUS | Status: AC | PRN
Start: 2014-05-25 — End: 2014-05-25
  Administered 2014-05-25: 500 [IU]
  Filled 2014-05-25: qty 5

## 2014-05-25 MED ORDER — FAMOTIDINE IN NACL 20-0.9 MG/50ML-% IV SOLN
INTRAVENOUS | Status: AC
  Filled 2014-05-25: qty 50

## 2014-05-25 MED ORDER — DEXAMETHASONE SODIUM PHOSPHATE 10 MG/ML IJ SOLN
8.0000 mg | Freq: Once | INTRAMUSCULAR | Status: AC
  Administered 2014-05-25: 8 mg via INTRAVENOUS

## 2014-05-25 MED ORDER — FAMOTIDINE IN NACL 20-0.9 MG/50ML-% IV SOLN
20.0000 mg | Freq: Once | INTRAVENOUS | Status: AC
  Administered 2014-05-25: 20 mg via INTRAVENOUS

## 2014-05-25 MED ORDER — OXYCODONE HCL 5 MG PO TABS: 5.0000 mg | ORAL_TABLET | ORAL | Status: DC | PRN

## 2014-05-25 MED ORDER — SODIUM CHLORIDE 0.9 % IV SOLN
10.0000 mg/kg | Freq: Once | INTRAVENOUS | Status: AC
  Administered 2014-05-25: 655 mg via INTRAVENOUS
  Filled 2014-05-25: qty 26.2

## 2014-05-25 MED ORDER — SODIUM CHLORIDE 0.9 % IV SOLN
Freq: Once | INTRAVENOUS | Status: AC
  Administered 2014-05-25: 09:00:00 via INTRAVENOUS

## 2014-05-25 MED ORDER — DIPHENHYDRAMINE HCL 25 MG PO CAPS
50.0000 mg | ORAL_CAPSULE | Freq: Once | ORAL | Status: AC
  Administered 2014-05-25: 50 mg via ORAL

## 2014-05-25 NOTE — Telephone Encounter (Signed)
Per staff message, research RN and POF I have scheduled appts. Advised scheduler of appts. JMW

## 2014-05-25 NOTE — Assessment & Plan Note (Signed)
IV pamidronate is contraindicated. She will continue oral dental care with a dentist.

## 2014-05-25 NOTE — Progress Notes (Signed)
[place] OFFICE PROGRESS NOTE  Patient Care Team: Hali Marry, MD as PCP - General Marye Round, MD (Radiation Oncology) Heath Lark, MD as Consulting Physician (Hematology and Oncology)  SUMMARY OF ONCOLOGIC HISTORY: Oncology History   Myeloma staging: ISS stage II (albumin 2.4, B2M 4.8) Durie-Salmon Stage III (hypercalcemia, bone lesions, renal failure)     Multiple myeloma   04/04/2012 Tumor Marker M Spike 4gm/dL, IgA 4020 mg/dL, serum kappa 2.11, urine test was positive for Bence Jones proteinemia.    04/08/2012 Bone Marrow Biopsy BM biopsy showed 92% plasma cell, loss chromosome 8 and 13, addition of chromosome 8p, 14q and Myeloma FISH showed 13q and extra chromosome 14   04/12/2012 - 10/20/2012 Chemotherapy Velcade 3 weeks on 1 week off, Revlimid 15 mg PO days 1-21 days, dexamethasone 40 mg weekly.    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   01/30/2014 Procedure She has placement of Infuse-a-Port   02/01/2014 - 03/01/2014 Chemotherapy Due to progression of disease, Velcade was added back. Revlimid doses reduced to 5 mg, 21 days on 7 days off. Treatment was discontinued due to new palpable lymphadenopathy.   03/08/2014 Imaging Increased size of large right lateral chest wall soft tissue mass with destruction of the left lateral seventh rib. New lymphadenopathy in the right lateral chest wall, right axilla, and right cardiophrenic angle.     04/11/2014 Surgery She underwent excisional lymph node  biopsy.   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   04/19/2014 - 04/24/2014 Hospital Admission The patient was admitted to the hospital for cycle 1 of chemotherapy for malignant hypercalcemia.   05/01/2014 Pathology Results I reviewed her case at the hematology tumor board. 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   05/18/2014 -  Chemotherapy She started on clinical trial utilizing Elotuzumab, dexamethasone and Revlimid.     INTERVAL HISTORY: Please see below for problem oriented charting. She is in prior to cycle 2 of treatment. She denies severe bone pain. She complained of sore on her right buttock. She is eating fair. Denies at swelling. She complains of weakness.  REVIEW OF SYSTEMS:   Constitutional: Denies fevers, chills or abnormal weight loss Eyes: Denies blurriness of vision Ears, nose, mouth, throat, and face: Denies mucositis or sore throat Respiratory: Denies cough, dyspnea or wheezes Cardiovascular: Denies palpitation, chest discomfort or lower extremity swelling Gastrointestinal:  Denies nausea, heartburn or change in bowel habits Lymphatics: Denies new lymphadenopathy or easy bruising Neurological:Denies numbness, tingling Behavioral/Psych: Mood is stable, no new changes  All other systems were reviewed with the patient and are negative.  I have reviewed the past medical history, past surgical history, social history and family history with the patient and they are unchanged from previous note.  ALLERGIES:  is allergic to simvastatin and zometa.  MEDICATIONS:  Current Outpatient Prescriptions  Medication Sig Dispense Refill  . acyclovir (ZOVIRAX) 400 MG tablet Take 400 mg by mouth daily.      Marland Kitchen  allopurinol (ZYLOPRIM) 300 MG tablet Take 1 tablet (300 mg total) by mouth daily.  30 tablet  0  . ALPRAZolam (XANAX) 1 MG tablet Take 1-2 tablets (1-2 mg total) by mouth at  bedtime as needed for anxiety.  60 tablet  2  . Alum & Mag Hydroxide-Simeth (MAGIC MOUTHWASH W/LIDOCAINE) SOLN Take 5 mLs by mouth 3 (three) times daily.  240 mL  0  . aspirin EC 81 MG tablet Take 81 mg by mouth daily.      . Calcium Carbonate-Vitamin D (CALCIUM 500 + D PO) Take 1 tablet by mouth 2 (two) times daily.      . CRESTOR 10 MG tablet TAKE 1 TABLET AT BEDTIME.  30 tablet  0  . dexamethasone (DECADRON) 4 MG tablet Take 7 tablets (28 mg total) by mouth once a week.  28 tablet  1  . lenalidomide (REVLIMID) 10 MG capsule Take 1 capsule (10 mg total) by mouth daily.  21 capsule  0  . lidocaine-prilocaine (EMLA) cream Apply 1 application topically daily as needed. Port      . losartan (COZAAR) 50 MG tablet Take 50 mg by mouth daily.      . Multiple Vitamin (MULTIVITAMIN WITH MINERALS) TABS Take 1 tablet by mouth daily.      Marland Kitchen oxyCODONE (ROXICODONE) 5 MG immediate release tablet Take 1 tablet (5 mg total) by mouth every 4 (four) hours as needed for severe pain.  60 tablet  0  . oxyCODONE-acetaminophen (PERCOCET/ROXICET) 5-325 MG per tablet Take 1 tablet by mouth every 4 (four) hours as needed for moderate pain or severe pain.      . potassium chloride SA (K-DUR,KLOR-CON) 20 MEQ tablet Take 1 tablet (20 mEq total) by mouth 2 (two) times daily.  14 tablet  0  . rosuvastatin (CRESTOR) 10 MG tablet Take 10 mg by mouth daily.      Marland Kitchen tetrahydrozoline 0.05 % ophthalmic solution Place 1 drop into both eyes 2 (two) times daily as needed (redness/dry eyes).      . traZODone (DESYREL) 50 MG tablet Take 1 tablet (50 mg total) by mouth at bedtime as needed for sleep.  90 tablet  3  . [DISCONTINUED] prochlorperazine (COMPAZINE) 10 MG tablet Take 1 tablet (10 mg total) by mouth every 6 (six) hours as needed (Nausea or vomiting).  30 tablet  1   No current facility-administered medications for this visit.    PHYSICAL EXAMINATION: ECOG PERFORMANCE STATUS: 1 - Symptomatic but completely ambulatory  Filed  Vitals:   05/25/14 0835  BP: 122/75  Pulse: 97  Temp: 98.1 F (36.7 C)  Resp: 17   Filed Weights   05/25/14 0835  Weight: 145 lb 14.4 oz (66.18 kg)    GENERAL:alert, no distress and comfortable. She looks thin but not cachectic SKIN: skin color, texture, turgor are normal, no rashes or significant lesions. Noted pressure sore on the right buttock area, stage I EYES: normal, Conjunctiva are pink and non-injected, sclera clear OROPHARYNX:no exudate, no erythema and lips, buccal mucosa, and tongue normal  NECK: supple, thyroid normal size, non-tender, without nodularity LYMPH:  no palpable lymphadenopathy in the cervical, axillary or inguinal LUNGS: clear to auscultation and percussion with normal breathing effort HEART: regular rate & rhythm and no murmurs and no lower extremity edema ABDOMEN:abdomen soft, non-tender and normal bowel sounds Musculoskeletal:no cyanosis of digits and no clubbing  NEURO: alert & oriented x 3 with fluent speech, no focal motor/sensory deficits  LABORATORY DATA:  I  have reviewed the data as listed    Component Value Date/Time   NA 142 05/25/2014 0811   NA 141 05/09/2014 1007   K 4.1 05/25/2014 0811   K 2.9* 05/09/2014 1007   CL 98 05/09/2014 1007   CL 105 02/23/2013 1334   CO2 26 05/25/2014 0811   CO2 29 05/09/2014 1007   GLUCOSE 125 05/25/2014 0811   GLUCOSE 148* 05/09/2014 1007   GLUCOSE 83 02/23/2013 1334   GLUCOSE 96 09/30/2006 1322   BUN 11.2 05/25/2014 0811   BUN 19 05/09/2014 1007   CREATININE 0.8 05/25/2014 0811   CREATININE 1.02 05/09/2014 1007   CREATININE 1.16* 07/10/2013 1001   CALCIUM 9.5 05/25/2014 0811   CALCIUM 9.4 05/09/2014 1007   CALCIUM 11.8* 04/04/2012 0420   PROT 5.4* 05/25/2014 0811   PROT 5.7* 05/09/2014 1007   ALBUMIN 2.8* 05/25/2014 0811   ALBUMIN 2.8* 05/09/2014 1007   AST 10 05/25/2014 0811   AST 33 05/09/2014 1007   ALT 11 05/25/2014 0811   ALT 62* 05/09/2014 1007   ALKPHOS 60 05/25/2014 0811   ALKPHOS 87 05/09/2014 1007   BILITOT  0.69 05/25/2014 0811   BILITOT 0.3 05/09/2014 1007   GFRNONAA 85* 04/23/2014 0428   GFRNONAA 46* 07/10/2013 1001   GFRAA >90 04/23/2014 0428   GFRAA 53* 07/10/2013 1001    No results found for this basename: SPEP, UPEP,  kappa and lambda light chains    Lab Results  Component Value Date   WBC 8.0 05/25/2014   NEUTROABS 6.8* 05/25/2014   HGB 10.0* 05/25/2014   HCT 30.7* 05/25/2014   MCV 97.6 05/25/2014   PLT 140* 05/25/2014      Chemistry      Component Value Date/Time   NA 142 05/25/2014 0811   NA 141 05/09/2014 1007   K 4.1 05/25/2014 0811   K 2.9* 05/09/2014 1007   CL 98 05/09/2014 1007   CL 105 02/23/2013 1334   CO2 26 05/25/2014 0811   CO2 29 05/09/2014 1007   BUN 11.2 05/25/2014 0811   BUN 19 05/09/2014 1007   CREATININE 0.8 05/25/2014 0811   CREATININE 1.02 05/09/2014 1007   CREATININE 1.16* 07/10/2013 1001      Component Value Date/Time   CALCIUM 9.5 05/25/2014 0811   CALCIUM 9.4 05/09/2014 1007   CALCIUM 11.8* 04/04/2012 0420   ALKPHOS 60 05/25/2014 0811   ALKPHOS 87 05/09/2014 1007   AST 10 05/25/2014 0811   AST 33 05/09/2014 1007   ALT 11 05/25/2014 0811   ALT 62* 05/09/2014 1007   BILITOT 0.69 05/25/2014 0811   BILITOT 0.3 05/09/2014 1007     ASSESSMENT & PLAN:  Multiple myeloma Clinically, she had excellent response to recent chemotherapy. She is currently enrolled in clinical trial [id number] which is an expanded access, relapsed/refractory, at least one prior regimen for multiple myeloma using Elotuzumab, dexamethasone and lenalidomide. So far she is tolerating treatment well. She has no recurrence of hypercalcemia. I recommend we continue the same without dosage adjustment.   Rib pain Her rib pain has resolved and clinically, the palpable mass is no longer palpable.    Pleural effusion Her chest examination showed no persistent pleural effusion.  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 She will continue oral dental care with a  dentist.      Hypokalemia This has resolved. I would discontinue potassium replacement therapy.  Hypercalcemia This has resolved. The patient will resume calcium with vitamin D supplement.  Normocytic anemia This is likely anemia of chronic disease and recent chemotherapy. The patient denies recent history of bleeding such as epistaxis, hematuria or hematochezia. She is asymptomatic from the anemia. We will observe for now.  She does not require transfusion now.        Pressure sore on buttocks This is due to immobility and malnutrition. I recommend conservative management only.  Protein-calorie malnutrition, moderate The serum albumin is low. I recommend increase oral intake as tolerated.   No orders of the defined types were placed in this encounter.   All questions were answered. The patient knows to call the clinic with any problems, questions or concerns. No barriers to learning was detected. I spent 30 minutes counseling the patient face to face. The total time spent in the appointment was 40 minutes and more than 50% was on counseling and review of test results     [facility], [place], MD 05/25/2014 8:59 AM

## 2014-05-25 NOTE — Assessment & Plan Note (Signed)
Her chest examination showed no persistent pleural effusion.

## 2014-05-25 NOTE — Telephone Encounter (Signed)
Husband asks if pt has to have her MMW refilled?  He says there are no refills on it.. Pt denies any mouth soreness and doesn't like the tast of it.  Informed husband it does not need to be refilled if pt not having any sore mouth or throat.  Just let us know if she needs it again.  He verbalized understanding.

## 2014-05-25 NOTE — Assessment & Plan Note (Signed)
This has resolved. The patient will resume calcium with vitamin D supplement.

## 2014-05-25 NOTE — Assessment & Plan Note (Signed)
Her rib pain has resolved and clinically, the palpable mass is no longer palpable.

## 2014-05-25 NOTE — Assessment & Plan Note (Signed)
This is due to immobility and malnutrition. I recommend conservative management only.

## 2014-05-25 NOTE — Progress Notes (Signed)
1026 elotuzumab started at 180cc x 90 cc for 30 minutes.

## 2014-05-25 NOTE — Assessment & Plan Note (Signed)
Clinically, she had excellent response to recent chemotherapy. She is currently enrolled in clinical trial [id number] which is an expanded access, relapsed/refractory, at least one prior regimen for multiple myeloma using Elotuzumab, dexamethasone and lenalidomide. So far she is tolerating treatment well. She has no recurrence of hypercalcemia. I recommend we continue the same without dosage adjustment.

## 2014-05-25 NOTE — Assessment & Plan Note (Signed)
This has resolved. I would discontinue potassium replacement therapy.

## 2014-05-25 NOTE — Progress Notes (Signed)
05/25/14 @ 9:15 am, BMS [id number], Cycle 1, Day 8:  Mr. [name] into the [facility] for a CBC and CMET, to see Dr. Alvy Bimler and to receive treatment with elotuzumab.  She reported taking dexamethasone 28 mg at 6:00 am today with food.  She said she is having no problems following her calendar for treatment.  Provided her with a September calendar with all Revlimid, dexamethasone and elotuzumab doses on it.  She confirmed that she was given her patient identification card on C1D1 of her treatment.   Obtained kit assignment through the [place], and took it to Dorian Pod, PharmD. Spoke with Randolm Idol, RN in the infusion room about her treatment today and provided her with a sign for the pump and the rates at which the drug is to given today.  Emphasized the timing of start of elotuzumab after pre-medications are complete.  Asked that she call if the patient has any grade infusion reaction.  06/01/14 @ 9:30 am, Cycle 1, Day 15:  Mrs. [name], accompanied by her husband, into the [facility] for labs and to receive her third infusion of elotuzumab.  She reported having taken dexamethasone 28 mg po at about 6:00 am today with food.  She continues to take Revlimid each evening.  She denies any problems and reports being a little more active over the last week.  Obtained kit assignment through [employer] and gave it to pharmacy. Spoke with Drucie Ip, RN and Etta Grandchild, RN in the infusion room about Mrs. [name]'s treatment today emphasizing the timing of pre-medications, the use of a 0.22 micron filter, and the rate at which the elotuzumab can be infused.  Asked them to call if Mrs. [name] should have any grade of infusion reaction.  06/08/14 @ 9:10 am, Cycle 1, Day 22:  Mrs. [name], accompanied by her husband, into the [facility] for labs and to receive elotuzumab.  She reports feeling well and having taken her dexamethasone 28 mg at 6:00 am today with food.  She completed the first cycle of Revlimid on Wednesday 06/06/14.  She  took all 21 days of Revlimid, although she started it one day earlier than she was repeatedly instructed.  Obtained kit assignment through [employer] and gave to Montel Clock, PharmD in the pharmacy. Spoke with Ortencia Kick, RN in the infusion room about the timing of pre-meds and the use of the 0.22 micron filter.  Mrs. [name] has not experience any grade infusion reaction, so she will continue to receive the drug at the maximum infusion rate.  Provided nurse with a sign for the pump with all this information. Spoke with Foye Spurling, RN at Dr. Calton Dach desk regarding the need for a refill for Revlimid for next week.  Patient to resume treatment on Friday 06/15/14.

## 2014-05-25 NOTE — Assessment & Plan Note (Signed)
This is likely anemia of chronic disease and recent chemotherapy. The patient denies recent history of bleeding such as epistaxis, hematuria or hematochezia. She is asymptomatic from the anemia. We will observe for now.  She does not require transfusion now.

## 2014-05-25 NOTE — Assessment & Plan Note (Signed)
The serum albumin is low. I recommend increase oral intake as tolerated.

## 2014-05-25 NOTE — Patient Instructions (Signed)
You have received elotuzumab today. Call the office @ [phone] if any nausea,vomiting or signs and symptoms of infection, or a fever.

## 2014-05-25 NOTE — Telephone Encounter (Signed)
Pt confirmed labs/ov per 08/28 POF, gave pt AVS....KJ °

## 2014-06-01 ENCOUNTER — Other Ambulatory Visit (HOSPITAL_BASED_OUTPATIENT_CLINIC_OR_DEPARTMENT_OTHER): Payer: Medicare Other

## 2014-06-01 ENCOUNTER — Other Ambulatory Visit: Payer: Self-pay | Admitting: Hematology and Oncology

## 2014-06-01 ENCOUNTER — Ambulatory Visit: Payer: Medicare Other

## 2014-06-01 ENCOUNTER — Ambulatory Visit (HOSPITAL_BASED_OUTPATIENT_CLINIC_OR_DEPARTMENT_OTHER): Payer: Medicare Other

## 2014-06-01 VITALS — BP 107/64 | HR 90 | Temp 97.4°F

## 2014-06-01 DIAGNOSIS — Z5112 Encounter for antineoplastic immunotherapy: Secondary | ICD-10-CM

## 2014-06-01 DIAGNOSIS — C9000 Multiple myeloma not having achieved remission: Secondary | ICD-10-CM

## 2014-06-01 DIAGNOSIS — Z95828 Presence of other vascular implants and grafts: Secondary | ICD-10-CM

## 2014-06-01 LAB — COMPREHENSIVE METABOLIC PANEL (CC13)
ALBUMIN: 2.8 g/dL — AB (ref 3.5–5.0)
ALK PHOS: 54 U/L (ref 40–150)
ALT: 13 U/L (ref 0–55)
ANION GAP: 9 meq/L (ref 3–11)
AST: 14 U/L (ref 5–34)
BILIRUBIN TOTAL: 0.49 mg/dL (ref 0.20–1.20)
BUN: 12.9 mg/dL (ref 7.0–26.0)
CO2: 23 meq/L (ref 22–29)
Calcium: 8.5 mg/dL (ref 8.4–10.4)
Chloride: 109 mEq/L (ref 98–109)
Creatinine: 0.8 mg/dL (ref 0.6–1.1)
GLUCOSE: 110 mg/dL (ref 70–140)
POTASSIUM: 4 meq/L (ref 3.5–5.1)
Sodium: 141 mEq/L (ref 136–145)
Total Protein: 5.4 g/dL — ABNORMAL LOW (ref 6.4–8.3)

## 2014-06-01 LAB — CBC WITH DIFFERENTIAL/PLATELET
BASO%: 0.4 % (ref 0.0–2.0)
Basophils Absolute: 0 10*3/uL (ref 0.0–0.1)
EOS ABS: 0.5 10*3/uL (ref 0.0–0.5)
EOS%: 7.9 % — ABNORMAL HIGH (ref 0.0–7.0)
HCT: 28.6 % — ABNORMAL LOW (ref 34.8–46.6)
HGB: 9.4 g/dL — ABNORMAL LOW (ref 11.6–15.9)
LYMPH%: 4.2 % — AB (ref 14.0–49.7)
MCH: 31.9 pg (ref 25.1–34.0)
MCHC: 32.8 g/dL (ref 31.5–36.0)
MCV: 97.2 fL (ref 79.5–101.0)
MONO#: 0.3 10*3/uL (ref 0.1–0.9)
MONO%: 5.4 % (ref 0.0–14.0)
NEUT%: 82.1 % — ABNORMAL HIGH (ref 38.4–76.8)
NEUTROS ABS: 4.8 10*3/uL (ref 1.5–6.5)
PLATELETS: 159 10*3/uL (ref 145–400)
RBC: 2.94 10*6/uL — ABNORMAL LOW (ref 3.70–5.45)
RDW: 19.5 % — ABNORMAL HIGH (ref 11.2–14.5)
WBC: 5.9 10*3/uL (ref 3.9–10.3)
lymph#: 0.2 10*3/uL — ABNORMAL LOW (ref 0.9–3.3)

## 2014-06-01 MED ORDER — ACETAMINOPHEN 325 MG PO TABS
650.0000 mg | ORAL_TABLET | Freq: Once | ORAL | Status: AC
  Administered 2014-06-01: 650 mg via ORAL

## 2014-06-01 MED ORDER — DEXAMETHASONE SODIUM PHOSPHATE 10 MG/ML IJ SOLN
8.0000 mg | Freq: Once | INTRAMUSCULAR | Status: AC
  Administered 2014-06-01: 8 mg via INTRAVENOUS

## 2014-06-01 MED ORDER — HEPARIN SOD (PORK) LOCK FLUSH 100 UNIT/ML IV SOLN
500.0000 [IU] | Freq: Once | INTRAVENOUS | Status: AC | PRN
  Administered 2014-06-01: 500 [IU]
  Filled 2014-06-01: qty 5

## 2014-06-01 MED ORDER — DIPHENHYDRAMINE HCL 25 MG PO CAPS
ORAL_CAPSULE | ORAL | Status: AC
  Filled 2014-06-01: qty 2

## 2014-06-01 MED ORDER — DEXAMETHASONE SODIUM PHOSPHATE 10 MG/ML IJ SOLN
INTRAMUSCULAR | Status: AC
  Filled 2014-06-01: qty 1

## 2014-06-01 MED ORDER — DIPHENHYDRAMINE HCL 25 MG PO CAPS
50.0000 mg | ORAL_CAPSULE | Freq: Once | ORAL | Status: AC
  Administered 2014-06-01: 50 mg via ORAL

## 2014-06-01 MED ORDER — SODIUM CHLORIDE 0.9 % IJ SOLN
10.0000 mL | INTRAMUSCULAR | Status: DC | PRN
  Administered 2014-06-01: 10 mL
  Filled 2014-06-01: qty 10

## 2014-06-01 MED ORDER — SODIUM CHLORIDE 0.9 % IV SOLN
Freq: Once | INTRAVENOUS | Status: AC
  Administered 2014-06-01: 10:00:00 via INTRAVENOUS

## 2014-06-01 MED ORDER — FAMOTIDINE IN NACL 20-0.9 MG/50ML-% IV SOLN
INTRAVENOUS | Status: AC
  Filled 2014-06-01: qty 50

## 2014-06-01 MED ORDER — INV-ELOTUZUMAB CHEMO INJECTION 400MG BMS CA204-143
10.0000 mg/kg | Freq: Once | INTRAMUSCULAR | Status: AC
  Administered 2014-06-01: 655 mg via INTRAVENOUS
  Filled 2014-06-01: qty 26.2

## 2014-06-01 MED ORDER — SODIUM CHLORIDE 0.9 % IJ SOLN
10.0000 mL | INTRAMUSCULAR | Status: DC | PRN
  Administered 2014-06-01: 10 mL via INTRAVENOUS
  Filled 2014-06-01: qty 10

## 2014-06-01 MED ORDER — ACETAMINOPHEN 325 MG PO TABS
ORAL_TABLET | ORAL | Status: AC
  Filled 2014-06-01: qty 2

## 2014-06-01 MED ORDER — FAMOTIDINE IN NACL 20-0.9 MG/50ML-% IV SOLN
20.0000 mg | Freq: Once | INTRAVENOUS | Status: AC
  Administered 2014-06-01: 20 mg via INTRAVENOUS

## 2014-06-01 NOTE — Patient Instructions (Signed)

## 2014-06-01 NOTE — Patient Instructions (Signed)
[facility] Discharge Instructions for Patients Receiving Chemotherapy  Today you received the following chemotherapy agents ; Elotuzumab  To help prevent nausea and vomiting after your treatment, we encourage you to take your nausea medication as directed.    If you develop nausea and vomiting that is not controlled by your nausea medication, call the clinic.   BELOW ARE SYMPTOMS THAT SHOULD BE REPORTED IMMEDIATELY:  *FEVER GREATER THAN 100.5 F  *CHILLS WITH OR WITHOUT FEVER  NAUSEA AND VOMITING THAT IS NOT CONTROLLED WITH YOUR NAUSEA MEDICATION  *UNUSUAL SHORTNESS OF BREATH  *UNUSUAL BRUISING OR BLEEDING  TENDERNESS IN MOUTH AND THROAT WITH OR WITHOUT PRESENCE OF ULCERS  *URINARY PROBLEMS  *BOWEL PROBLEMS  UNUSUAL RASH Items with * indicate a potential emergency and should be followed up as soon as possible.  Feel free to call the clinic you have any questions or concerns. The clinic phone number is [phone].    

## 2014-06-08 ENCOUNTER — Other Ambulatory Visit: Payer: Self-pay | Admitting: *Deleted

## 2014-06-08 ENCOUNTER — Ambulatory Visit: Payer: Medicare Other

## 2014-06-08 ENCOUNTER — Ambulatory Visit (HOSPITAL_BASED_OUTPATIENT_CLINIC_OR_DEPARTMENT_OTHER): Payer: Medicare Other

## 2014-06-08 ENCOUNTER — Other Ambulatory Visit (HOSPITAL_BASED_OUTPATIENT_CLINIC_OR_DEPARTMENT_OTHER): Payer: Medicare Other

## 2014-06-08 VITALS — BP 111/68 | HR 75 | Temp 97.8°F | Resp 18

## 2014-06-08 DIAGNOSIS — Z5112 Encounter for antineoplastic immunotherapy: Secondary | ICD-10-CM

## 2014-06-08 DIAGNOSIS — C9000 Multiple myeloma not having achieved remission: Secondary | ICD-10-CM

## 2014-06-08 DIAGNOSIS — Z95828 Presence of other vascular implants and grafts: Secondary | ICD-10-CM

## 2014-06-08 LAB — CBC WITH DIFFERENTIAL/PLATELET
BASO%: 0.8 % (ref 0.0–2.0)
BASOS ABS: 0 10*3/uL (ref 0.0–0.1)
EOS%: 10.1 % — ABNORMAL HIGH (ref 0.0–7.0)
Eosinophils Absolute: 0.4 10*3/uL (ref 0.0–0.5)
HEMATOCRIT: 27.9 % — AB (ref 34.8–46.6)
HEMOGLOBIN: 8.8 g/dL — AB (ref 11.6–15.9)
LYMPH%: 8.3 % — ABNORMAL LOW (ref 14.0–49.7)
MCH: 30.9 pg (ref 25.1–34.0)
MCHC: 31.5 g/dL (ref 31.5–36.0)
MCV: 97.9 fL (ref 79.5–101.0)
MONO#: 0.3 10*3/uL (ref 0.1–0.9)
MONO%: 7.8 % (ref 0.0–14.0)
NEUT%: 73 % (ref 38.4–76.8)
NEUTROS ABS: 2.9 10*3/uL (ref 1.5–6.5)
PLATELETS: 176 10*3/uL (ref 145–400)
RBC: 2.85 10*6/uL — ABNORMAL LOW (ref 3.70–5.45)
RDW: 18 % — ABNORMAL HIGH (ref 11.2–14.5)
WBC: 4 10*3/uL (ref 3.9–10.3)
lymph#: 0.3 10*3/uL — ABNORMAL LOW (ref 0.9–3.3)
nRBC: 0 % (ref 0–0)

## 2014-06-08 LAB — COMPREHENSIVE METABOLIC PANEL (CC13)
ALT: 9 U/L (ref 0–55)
AST: 15 U/L (ref 5–34)
Albumin: 2.6 g/dL — ABNORMAL LOW (ref 3.5–5.0)
Alkaline Phosphatase: 52 U/L (ref 40–150)
Anion Gap: 12 mEq/L — ABNORMAL HIGH (ref 3–11)
BUN: 11.3 mg/dL (ref 7.0–26.0)
CO2: 23 mEq/L (ref 22–29)
CREATININE: 0.8 mg/dL (ref 0.6–1.1)
Calcium: 8.7 mg/dL (ref 8.4–10.4)
Chloride: 107 mEq/L (ref 98–109)
Glucose: 113 mg/dl (ref 70–140)
Potassium: 4 mEq/L (ref 3.5–5.1)
Sodium: 142 mEq/L (ref 136–145)
Total Bilirubin: 0.61 mg/dL (ref 0.20–1.20)
Total Protein: 5.1 g/dL — ABNORMAL LOW (ref 6.4–8.3)

## 2014-06-08 MED ORDER — SODIUM CHLORIDE 0.9 % IV SOLN
10.0000 mg/kg | Freq: Once | INTRAVENOUS | Status: AC
  Administered 2014-06-08: 655 mg via INTRAVENOUS
  Filled 2014-06-08: qty 26.2

## 2014-06-08 MED ORDER — SODIUM CHLORIDE 0.9 % IJ SOLN
10.0000 mL | INTRAMUSCULAR | Status: DC | PRN
  Administered 2014-06-08: 10 mL via INTRAVENOUS
  Filled 2014-06-08: qty 10

## 2014-06-08 MED ORDER — DIPHENHYDRAMINE HCL 25 MG PO CAPS
50.0000 mg | ORAL_CAPSULE | Freq: Once | ORAL | Status: AC
Start: 2014-06-08 — End: 2014-06-08
  Administered 2014-06-08: 50 mg via ORAL

## 2014-06-08 MED ORDER — DEXAMETHASONE SODIUM PHOSPHATE 10 MG/ML IJ SOLN
INTRAMUSCULAR | Status: AC
Start: 2014-06-08 — End: 2014-06-08
  Filled 2014-06-08: qty 1

## 2014-06-08 MED ORDER — FAMOTIDINE IN NACL 20-0.9 MG/50ML-% IV SOLN
20.0000 mg | Freq: Once | INTRAVENOUS | Status: AC
  Administered 2014-06-08: 20 mg via INTRAVENOUS

## 2014-06-08 MED ORDER — SODIUM CHLORIDE 0.9 % IJ SOLN
10.0000 mL | INTRAMUSCULAR | Status: DC | PRN
  Administered 2014-06-08: 10 mL
  Filled 2014-06-08: qty 10

## 2014-06-08 MED ORDER — SODIUM CHLORIDE 0.9 % IV SOLN
Freq: Once | INTRAVENOUS | Status: AC
  Administered 2014-06-08: 09:00:00 via INTRAVENOUS

## 2014-06-08 MED ORDER — ACETAMINOPHEN 325 MG PO TABS
650.0000 mg | ORAL_TABLET | Freq: Once | ORAL | Status: AC
  Administered 2014-06-08: 650 mg via ORAL

## 2014-06-08 MED ORDER — DIPHENHYDRAMINE HCL 25 MG PO CAPS
ORAL_CAPSULE | ORAL | Status: AC
  Filled 2014-06-08: qty 2

## 2014-06-08 MED ORDER — DEXAMETHASONE SODIUM PHOSPHATE 10 MG/ML IJ SOLN
8.0000 mg | Freq: Once | INTRAMUSCULAR | Status: AC
  Administered 2014-06-08: 8 mg via INTRAVENOUS

## 2014-06-08 MED ORDER — FAMOTIDINE IN NACL 20-0.9 MG/50ML-% IV SOLN
INTRAVENOUS | Status: AC
  Filled 2014-06-08: qty 50

## 2014-06-08 MED ORDER — HEPARIN SOD (PORK) LOCK FLUSH 100 UNIT/ML IV SOLN
500.0000 [IU] | Freq: Once | INTRAVENOUS | Status: AC | PRN
  Administered 2014-06-08: 500 [IU]
  Filled 2014-06-08: qty 5

## 2014-06-08 MED ORDER — ACETAMINOPHEN 325 MG PO TABS
ORAL_TABLET | ORAL | Status: AC
  Filled 2014-06-08: qty 2

## 2014-06-08 NOTE — Telephone Encounter (Signed)
THIS REFILL REQUEST FOR REVLIMID WAS PLACED ON DR.GORSUCH'S DESK. 

## 2014-06-08 NOTE — Progress Notes (Signed)
Pt states she had flu shot yesterday 06/07/14

## 2014-06-08 NOTE — Patient Instructions (Signed)
[place] Discharge Instructions for Patients Receiving Chemotherapy  Today you received the following chemotherapy agents Elotuzmab  To help prevent nausea and vomiting after your treatment, we encourage you to take your nausea medication as directed   If you develop nausea and vomiting that is not controlled by your nausea medication, call the clinic.   BELOW ARE SYMPTOMS THAT SHOULD BE REPORTED IMMEDIATELY:  *FEVER GREATER THAN 100.5 F  *CHILLS WITH OR WITHOUT FEVER  NAUSEA AND VOMITING THAT IS NOT CONTROLLED WITH YOUR NAUSEA MEDICATION  *UNUSUAL SHORTNESS OF BREATH  *UNUSUAL BRUISING OR BLEEDING  TENDERNESS IN MOUTH AND THROAT WITH OR WITHOUT PRESENCE OF ULCERS  *URINARY PROBLEMS  *BOWEL PROBLEMS  UNUSUAL RASH Items with * indicate a potential emergency and should be followed up as soon as possible.  Feel free to call the clinic you have any questions or concerns. The clinic phone number is [phone].

## 2014-06-08 NOTE — Patient Instructions (Signed)

## 2014-06-11 ENCOUNTER — Other Ambulatory Visit: Payer: Self-pay | Admitting: *Deleted

## 2014-06-11 MED ORDER — LENALIDOMIDE 10 MG PO CAPS: 10.0000 mg | ORAL_CAPSULE | Freq: Every day | ORAL | Status: DC

## 2014-06-11 NOTE — Telephone Encounter (Signed)
RECEIVED A FAX FROM [facility] CONCERNING A CONFIRMATION OF FACSIMILE RECEIPT FOR PT. REFERRAL. 

## 2014-06-12 NOTE — Telephone Encounter (Signed)
RECEIVED A FAX FROM [facility] CONCERNING A CONFIRMATION OF PRESCRIPTION SHIPMENT FOR REVLIMID ON 06/11/14.

## 2014-06-13 ENCOUNTER — Telehealth: Payer: Self-pay | Admitting: *Deleted

## 2014-06-13 NOTE — Telephone Encounter (Signed)
Pt called to ask when she is suppose to start her next cycle of Revlimid?  Informed pt she is due to start it again this Friday on 9/18 and she verbalized understanding.  Confirmed her appts for Friday as well.

## 2014-06-15 ENCOUNTER — Ambulatory Visit (HOSPITAL_BASED_OUTPATIENT_CLINIC_OR_DEPARTMENT_OTHER): Payer: Medicare Other

## 2014-06-15 ENCOUNTER — Other Ambulatory Visit (HOSPITAL_BASED_OUTPATIENT_CLINIC_OR_DEPARTMENT_OTHER): Payer: Medicare Other

## 2014-06-15 ENCOUNTER — Ambulatory Visit: Payer: Medicare Other

## 2014-06-15 ENCOUNTER — Encounter: Payer: Self-pay | Admitting: *Deleted

## 2014-06-15 ENCOUNTER — Encounter: Payer: Self-pay | Admitting: Hematology and Oncology

## 2014-06-15 ENCOUNTER — Telehealth: Payer: Self-pay | Admitting: Hematology and Oncology

## 2014-06-15 ENCOUNTER — Ambulatory Visit (HOSPITAL_BASED_OUTPATIENT_CLINIC_OR_DEPARTMENT_OTHER): Payer: Medicare Other | Admitting: Hematology and Oncology

## 2014-06-15 VITALS — BP 142/75 | HR 88 | Temp 98.0°F | Resp 17 | Ht 66.0 in | Wt 153.1 lb

## 2014-06-15 DIAGNOSIS — M8708 Idiopathic aseptic necrosis of bone, other site: Secondary | ICD-10-CM

## 2014-06-15 DIAGNOSIS — Z5112 Encounter for antineoplastic immunotherapy: Secondary | ICD-10-CM

## 2014-06-15 DIAGNOSIS — M545 Low back pain, unspecified: Secondary | ICD-10-CM

## 2014-06-15 DIAGNOSIS — C9000 Multiple myeloma not having achieved remission: Secondary | ICD-10-CM

## 2014-06-15 DIAGNOSIS — M87180 Osteonecrosis due to drugs, jaw: Secondary | ICD-10-CM

## 2014-06-15 DIAGNOSIS — Z95828 Presence of other vascular implants and grafts: Secondary | ICD-10-CM

## 2014-06-15 DIAGNOSIS — R0781 Pleurodynia: Secondary | ICD-10-CM

## 2014-06-15 DIAGNOSIS — D649 Anemia, unspecified: Secondary | ICD-10-CM

## 2014-06-15 LAB — COMPREHENSIVE METABOLIC PANEL (CC13)
ALK PHOS: 54 U/L (ref 40–150)
ALT: 9 U/L (ref 0–55)
AST: 14 U/L (ref 5–34)
Albumin: 2.8 g/dL — ABNORMAL LOW (ref 3.5–5.0)
Anion Gap: 11 mEq/L (ref 3–11)
BUN: 8.2 mg/dL (ref 7.0–26.0)
CO2: 24 mEq/L (ref 22–29)
Calcium: 9 mg/dL (ref 8.4–10.4)
Chloride: 106 mEq/L (ref 98–109)
Creatinine: 0.8 mg/dL (ref 0.6–1.1)
Glucose: 108 mg/dl (ref 70–140)
POTASSIUM: 4.5 meq/L (ref 3.5–5.1)
Sodium: 141 mEq/L (ref 136–145)
Total Bilirubin: 0.45 mg/dL (ref 0.20–1.20)
Total Protein: 5.3 g/dL — ABNORMAL LOW (ref 6.4–8.3)

## 2014-06-15 LAB — CBC WITH DIFFERENTIAL/PLATELET
BASO%: 3.9 % — ABNORMAL HIGH (ref 0.0–2.0)
Basophils Absolute: 0.2 10*3/uL — ABNORMAL HIGH (ref 0.0–0.1)
EOS ABS: 0.1 10*3/uL (ref 0.0–0.5)
EOS%: 2.8 % (ref 0.0–7.0)
HCT: 29.4 % — ABNORMAL LOW (ref 34.8–46.6)
HGB: 9.5 g/dL — ABNORMAL LOW (ref 11.6–15.9)
LYMPH%: 6.5 % — ABNORMAL LOW (ref 14.0–49.7)
MCH: 31.6 pg (ref 25.1–34.0)
MCHC: 32.4 g/dL (ref 31.5–36.0)
MCV: 97.6 fL (ref 79.5–101.0)
MONO#: 0.3 10*3/uL (ref 0.1–0.9)
MONO%: 5 % (ref 0.0–14.0)
NEUT%: 81.8 % — ABNORMAL HIGH (ref 38.4–76.8)
NEUTROS ABS: 4.4 10*3/uL (ref 1.5–6.5)
Platelets: 209 10*3/uL (ref 145–400)
RBC: 3.01 10*6/uL — ABNORMAL LOW (ref 3.70–5.45)
RDW: 19 % — ABNORMAL HIGH (ref 11.2–14.5)
WBC: 5.3 10*3/uL (ref 3.9–10.3)
lymph#: 0.3 10*3/uL — ABNORMAL LOW (ref 0.9–3.3)

## 2014-06-15 MED ORDER — SODIUM CHLORIDE 0.9 % IJ SOLN
10.0000 mL | INTRAMUSCULAR | Status: DC | PRN
  Administered 2014-06-15: 10 mL via INTRAVENOUS
  Filled 2014-06-15: qty 10

## 2014-06-15 MED ORDER — FAMOTIDINE IN NACL 20-0.9 MG/50ML-% IV SOLN
20.0000 mg | Freq: Once | INTRAVENOUS | Status: AC
  Administered 2014-06-15: 20 mg via INTRAVENOUS

## 2014-06-15 MED ORDER — DEXAMETHASONE SODIUM PHOSPHATE 10 MG/ML IJ SOLN
INTRAMUSCULAR | Status: AC
  Filled 2014-06-15: qty 1

## 2014-06-15 MED ORDER — HEPARIN SOD (PORK) LOCK FLUSH 100 UNIT/ML IV SOLN
500.0000 [IU] | Freq: Once | INTRAVENOUS | Status: AC | PRN
  Administered 2014-06-15: 500 [IU]
  Filled 2014-06-15: qty 5

## 2014-06-15 MED ORDER — OXYCODONE HCL 5 MG PO TABS: 5.0000 mg | ORAL_TABLET | ORAL | Status: AC | PRN

## 2014-06-15 MED ORDER — DEXAMETHASONE SODIUM PHOSPHATE 10 MG/ML IJ SOLN
8.0000 mg | Freq: Once | INTRAMUSCULAR | Status: AC
  Administered 2014-06-15: 8 mg via INTRAVENOUS

## 2014-06-15 MED ORDER — SODIUM CHLORIDE 0.9 % IJ SOLN
10.0000 mL | INTRAMUSCULAR | Status: DC | PRN
  Administered 2014-06-15: 10 mL
  Filled 2014-06-15: qty 10

## 2014-06-15 MED ORDER — DIPHENHYDRAMINE HCL 25 MG PO CAPS
50.0000 mg | ORAL_CAPSULE | Freq: Once | ORAL | Status: AC
  Administered 2014-06-15: 50 mg via ORAL

## 2014-06-15 MED ORDER — DIPHENHYDRAMINE HCL 25 MG PO CAPS
ORAL_CAPSULE | ORAL | Status: AC
  Filled 2014-06-15: qty 2

## 2014-06-15 MED ORDER — ACETAMINOPHEN 325 MG PO TABS
650.0000 mg | ORAL_TABLET | Freq: Once | ORAL | Status: AC
  Administered 2014-06-15: 650 mg via ORAL

## 2014-06-15 MED ORDER — FAMOTIDINE IN NACL 20-0.9 MG/50ML-% IV SOLN
INTRAVENOUS | Status: AC
Start: 2014-06-15 — End: 2014-06-15
  Filled 2014-06-15: qty 50

## 2014-06-15 MED ORDER — ACETAMINOPHEN 325 MG PO TABS
ORAL_TABLET | ORAL | Status: AC
  Filled 2014-06-15: qty 2

## 2014-06-15 MED ORDER — SODIUM CHLORIDE 0.9 % IV SOLN
Freq: Once | INTRAVENOUS | Status: AC
  Administered 2014-06-15: 10:00:00 via INTRAVENOUS

## 2014-06-15 MED ORDER — SODIUM CHLORIDE 0.9 % IV SOLN
10.0000 mg/kg | Freq: Once | INTRAVENOUS | Status: AC
  Administered 2014-06-15: 695 mg via INTRAVENOUS
  Filled 2014-06-15: qty 27.8

## 2014-06-15 NOTE — Telephone Encounter (Signed)
lvm for pt regarding to 9.25 appt...and at visit to get updated appt sched for OCT.

## 2014-06-15 NOTE — Assessment & Plan Note (Signed)
This is likely anemia of chronic disease and recent chemotherapy. The patient denies recent history of bleeding such as epistaxis, hematuria or hematochezia. She is asymptomatic from the anemia. We will observe for now.  She does not require transfusion now.

## 2014-06-15 NOTE — Assessment & Plan Note (Signed)
Her rib pain has resolved and clinically, the palpable mass is no longer palpable. She continues to take pain medicine as needed along with pain medicine for back pain. I refilled her pain prescription today and to discuss about refill policy.

## 2014-06-15 NOTE — Progress Notes (Signed)
06/15/14 @ 10:25 am, BMS [id number], Cycle 2, Day 1:  [name] into the [facility], accompanied by her husband, for labs, to see Dr. Alvy Bimler, and to receive treatment with elotuzumab.  She reported having taken dexamethasone 28 mg po this morning at 6:00 am with food.  She has received lenalidomide from [facility] and will start this cycle's dosing at bedtime tonight.  Dr. Alvy Bimler decided to decrease the dose of dexamethasone to 12 mg orally weekly prior to each dose of elotuzumab this cycle beginning next week.  Changed the patient's medication diary to reflect that change for the last dose in September.  Will provide her a new calendar for October at the next visit. Obtained kit assignment through [employer] [facility] [employer], and gave it to pharmacy. Spoke with Drucie Ip, RN in the infusion area about treatment for today.  Provided her with a sign for the pump and emphasized the timing of the pre-meds and the use of the 0.22 micron filter.  06/22/14 $Remove'@8'[id number]$ :40 am, Cycle 2, Day 8:  [name] into the [facility] accompanied by her husband.  She had a CBC, CMET and restaging labs drawn.  She reported having taken dexamethasone 12 mg at 6:30 am with food this morning and has been taking lenalidomide as directed.  She has no new complaints today.  Obtained kit assignment through [employer].  Per Raul Del, PharmD., one of the vials assigned was found in the trash in the research pharmacy.  It was unknown when it was removed from the refrigerator, so that vial was entered into [facility] as damaged and another vial was assigned.  Provided the assignment to Theone Murdoch, PharmD. Spoke with Mayra Reel, RN in the infusion area about patient's treatment today. Provided him with a sign and emphasized the timing of the pre-meds and the use of a 0.22 micron filter.  06/28/14 @ 3:00 pm, Cycle 2, Day 14:  [name] into the [facility] for labs and to see Dr. Alvy Bimler.  He husband had called to say she was not feeling well, has a poor appetite  and generalized weakness.  She was asked to come in to be assessed.  After seeing the patient, Dr. Alvy Bimler canceled treatment for tomorrow [phone]).  Her heart rate is elevated and ECG reveals atrial fibrillation.  She was unable to arise from a seated position without 2 person assistance.  She will be admitted to the hospital for management of A.fib related to respiratory infection per Dr. Calton Dach note.  07/04/14: [name] discharged home with home hospice.  She has elected to discontinue chemotherapy and receive palliative care.  Discontinued from [place] [id number] via [employer].

## 2014-06-15 NOTE — Progress Notes (Signed)
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    04/08/2012 Bone Marrow Biopsy BM biopsy showed 92% plasma cell, loss chromosome 8 and 13, addition of chromosome 8p, 14q and Myeloma FISH showed 13q and extra chromosome 14   04/12/2012 - 10/20/2012 Chemotherapy Velcade 3 weeks on 1 week off, Revlimid 15 mg PO days 1-21 days, dexamethasone 40 mg weekly.    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   01/30/2014 Procedure She has placement of Infuse-a-Port   02/01/2014 - 03/01/2014 Chemotherapy Due to progression of disease, Velcade was added back. Revlimid doses reduced to 5 mg, 21 days on 7 days off. Treatment was discontinued due to new palpable lymphadenopathy.   03/08/2014 Imaging Increased size of large right lateral chest wall soft tissue mass with destruction of the left lateral seventh rib. New lymphadenopathy in the right lateral chest wall, right axilla, and right cardiophrenic angle.     04/11/2014 Surgery She underwent excisional lymph node  biopsy.   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   04/19/2014 - 04/24/2014 Hospital Admission The patient was admitted to the hospital for cycle 1 of chemotherapy for malignant hypercalcemia.   05/01/2014 Pathology Results I reviewed her case at the hematology tumor board. 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   05/18/2014 -  Chemotherapy She started on clinical trial utilizing Elotuzumab, dexamethasone and Revlimid.    INTERVAL HISTORY: Please see below for problem oriented charting. She is in prior to cycle 2 of treatment. Her rib pain is improving. She has some mild back pain. She is eating better and is gaining weight.  REVIEW OF SYSTEMS:   Constitutional: Denies fevers, chills or abnormal weight loss Eyes: Denies blurriness of vision Ears, nose, mouth, throat, and face: Denies mucositis or sore throat Respiratory: Denies cough, dyspnea or wheezes Cardiovascular: Denies palpitation, chest discomfort or lower extremity swelling Gastrointestinal:  Denies nausea, heartburn or change in bowel habits Skin: Denies abnormal skin rashes Lymphatics: Denies new lymphadenopathy or easy bruising Neurological:Denies numbness, tingling or new weaknesses Behavioral/Psych: Mood is stable, no new changes  All other systems were reviewed with the patient and are negative.  I have reviewed the past medical history, past surgical history, social history and family history with the patient and they are unchanged from previous note.  ALLERGIES:  is allergic to simvastatin and zometa.  MEDICATIONS:  Current Outpatient Prescriptions  Medication Sig Dispense Refill  . acyclovir (ZOVIRAX) 400 MG tablet Take 400 mg by  mouth daily.      Marland Kitchen allopurinol (ZYLOPRIM) 300 MG tablet Take 1 tablet (300 mg total) by mouth daily.  30 tablet  0  . ALPRAZolam (XANAX) 1 MG tablet Take 1-2 tablets (1-2 mg total) by mouth  at bedtime as needed for anxiety.  60 tablet  2  . Alum & Mag Hydroxide-Simeth (MAGIC MOUTHWASH W/LIDOCAINE) SOLN Take 5 mLs by mouth 3 (three) times daily.  240 mL  0  . aspirin EC 81 MG tablet Take 81 mg by mouth daily.      . Calcium Carbonate-Vitamin D (CALCIUM 500 + D PO) Take 1 tablet by mouth 2 (two) times daily.      . CRESTOR 10 MG tablet TAKE 1 TABLET AT BEDTIME.  30 tablet  0  . dexamethasone (DECADRON) 4 MG tablet Take 7 tablets (28 mg total) by mouth once a week.  28 tablet  1  . lenalidomide (REVLIMID) 10 MG capsule Take 1 capsule (10 mg total) by mouth daily.  21 capsule  0  . lidocaine-prilocaine (EMLA) cream Apply 1 application topically daily as needed. Port      . losartan (COZAAR) 50 MG tablet Take 50 mg by mouth daily.      . Multiple Vitamin (MULTIVITAMIN WITH MINERALS) TABS Take 1 tablet by mouth daily.      Marland Kitchen oxyCODONE (ROXICODONE) 5 MG immediate release tablet Take 1 tablet (5 mg total) by mouth every 4 (four) hours as needed for severe pain.  90 tablet  0  . potassium chloride SA (K-DUR,KLOR-CON) 20 MEQ tablet Take 1 tablet (20 mEq total) by mouth 2 (two) times daily.  14 tablet  0  . tetrahydrozoline 0.05 % ophthalmic solution Place 1 drop into both eyes 2 (two) times daily as needed (redness/dry eyes).      . traZODone (DESYREL) 50 MG tablet Take 1 tablet (50 mg total) by mouth at bedtime as needed for sleep.  90 tablet  3  . [DISCONTINUED] prochlorperazine (COMPAZINE) 10 MG tablet Take 1 tablet (10 mg total) by mouth every 6 (six) hours as needed (Nausea or vomiting).  30 tablet  1   No current facility-administered medications for this visit.    PHYSICAL EXAMINATION: ECOG PERFORMANCE STATUS: 1 - Symptomatic but completely ambulatory  Filed Vitals:   06/15/14 0943  BP: 142/75  Pulse: 88  Temp: 98 F (36.7 C)  Resp: 17   Filed Weights   06/15/14 0943  Weight: 153 lb 1.6 oz (69.446 kg)    GENERAL:alert, no distress and comfortable SKIN: skin color,  texture, turgor are normal, no rashes or significant lesions EYES: normal, Conjunctiva are pink and non-injected, sclera clear OROPHARYNX:no exudate, no erythema and lips, buccal mucosa, and tongue normal  NECK: supple, thyroid normal size, non-tender, without nodularity LYMPH:  no palpable lymphadenopathy in the cervical, axillary or inguinal LUNGS: clear to auscultation and percussion with normal breathing effort HEART: regular rate & rhythm and no murmurs and no lower extremity edema ABDOMEN:abdomen soft, non-tender and normal bowel sounds Musculoskeletal:no cyanosis of digits and no clubbing  NEURO: alert & oriented x 3 with fluent speech, no focal motor/sensory deficits  LABORATORY DATA:  I have reviewed the data as listed    Component Value Date/Time   NA 141 06/15/2014 0848   NA 141 05/09/2014 1007   K 4.5 06/15/2014 0848   K 2.9* 05/09/2014 1007   CL 98 05/09/2014 1007   CL 105 02/23/2013 1334   CO2 24 06/15/2014 0848  CO2 29 05/09/2014 1007   GLUCOSE 108 06/15/2014 0848   GLUCOSE 148* 05/09/2014 1007   GLUCOSE 83 02/23/2013 1334   GLUCOSE 96 09/30/2006 1322   BUN 8.2 06/15/2014 0848   BUN 19 05/09/2014 1007   CREATININE 0.8 06/15/2014 0848   CREATININE 1.02 05/09/2014 1007   CREATININE 1.16* 07/10/2013 1001   CALCIUM 9.0 06/15/2014 0848   CALCIUM 9.4 05/09/2014 1007   CALCIUM 11.8* 04/04/2012 0420   PROT 5.3* 06/15/2014 0848   PROT 5.7* 05/09/2014 1007   ALBUMIN 2.8* 06/15/2014 0848   ALBUMIN 2.8* 05/09/2014 1007   AST 14 06/15/2014 0848   AST 33 05/09/2014 1007   ALT 9 06/15/2014 0848   ALT 62* 05/09/2014 1007   ALKPHOS 54 06/15/2014 0848   ALKPHOS 87 05/09/2014 1007   BILITOT 0.45 06/15/2014 0848   BILITOT 0.3 05/09/2014 1007   GFRNONAA 85* 04/23/2014 0428   GFRNONAA 46* 07/10/2013 1001   GFRAA >90 04/23/2014 0428   GFRAA 53* 07/10/2013 1001    No results found for this basename: SPEP, UPEP,  kappa and lambda light chains    Lab Results  Component Value Date   WBC 5.3 06/15/2014    NEUTROABS 4.4 06/15/2014   HGB 9.5* 06/15/2014   HCT 29.4* 06/15/2014   MCV 97.6 06/15/2014   PLT 209 06/15/2014      Chemistry      Component Value Date/Time   NA 141 06/15/2014 0848   NA 141 05/09/2014 1007   K 4.5 06/15/2014 0848   K 2.9* 05/09/2014 1007   CL 98 05/09/2014 1007   CL 105 02/23/2013 1334   CO2 24 06/15/2014 0848   CO2 29 05/09/2014 1007   BUN 8.2 06/15/2014 0848   BUN 19 05/09/2014 1007   CREATININE 0.8 06/15/2014 0848   CREATININE 1.02 05/09/2014 1007   CREATININE 1.16* 07/10/2013 1001      Component Value Date/Time   CALCIUM 9.0 06/15/2014 0848   CALCIUM 9.4 05/09/2014 1007   CALCIUM 11.8* 04/04/2012 0420   ALKPHOS 54 06/15/2014 0848   ALKPHOS 87 05/09/2014 1007   AST 14 06/15/2014 0848   AST 33 05/09/2014 1007   ALT 9 06/15/2014 0848   ALT 62* 05/09/2014 1007   BILITOT 0.45 06/15/2014 0848   BILITOT 0.3 05/09/2014 1007     ASSESSMENT & PLAN:  Multiple myeloma Clinically, she is responding well to treatment. I will recheck serum protein electrophoresis and free light chains again in the near future. In the meantime, I plan to reduce dexamethasone to 5 tablets weekly. I will continue the rest of the treatment without dosage adjustment.  BACK PAIN, LUMBAR Her rib pain has resolved and clinically, the palpable mass is no longer palpable. She continues to take pain medicine as needed along with pain medicine for back pain. I refilled her pain prescription today and to discuss about refill policy.  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 She will continue oral dental care with a dentist.  Normocytic anemia This is likely anemia of chronic disease and recent chemotherapy. The patient denies recent history of bleeding such as epistaxis, hematuria or hematochezia. She is asymptomatic from the anemia. We will observe for now.  She does not require transfusion now.     Orders Placed This Encounter  Procedures  . SPEP & IFE with QIG    Standing  Status: Future     Number of Occurrences:      Standing Expiration Date: 07/20/2015  .  Kappa/lambda light chains    Standing Status: Future     Number of Occurrences:      Standing Expiration Date: 07/20/2015   All questions were answered. The patient knows to call the clinic with any problems, questions or concerns. No barriers to learning was detected. I spent 30 minutes counseling the patient face to face. The total time spent in the appointment was 40 minutes and more than 50% was on counseling and review of test results     [facility], [place], MD 06/15/2014 4:53 PM

## 2014-06-15 NOTE — Assessment & Plan Note (Signed)
IV pamidronate is contraindicated. She will continue oral dental care with a dentist.

## 2014-06-15 NOTE — Patient Instructions (Signed)

## 2014-06-15 NOTE — Assessment & Plan Note (Signed)
Clinically, she is responding well to treatment. I will recheck serum protein electrophoresis and free light chains again in the near future. In the meantime, I plan to reduce dexamethasone to 5 tablets weekly. I will continue the rest of the treatment without dosage adjustment.

## 2014-06-15 NOTE — Patient Instructions (Signed)
[facility] Discharge Instructions for Patients Receiving Chemotherapy  Today you received the following chemotherapy agents ; Elotuzumab  To help prevent nausea and vomiting after your treatment, we encourage you to take your nausea medication as directed.    If you develop nausea and vomiting that is not controlled by your nausea medication, call the clinic.   BELOW ARE SYMPTOMS THAT SHOULD BE REPORTED IMMEDIATELY:  *FEVER GREATER THAN 100.5 F  *CHILLS WITH OR WITHOUT FEVER  NAUSEA AND VOMITING THAT IS NOT CONTROLLED WITH YOUR NAUSEA MEDICATION  *UNUSUAL SHORTNESS OF BREATH  *UNUSUAL BRUISING OR BLEEDING  TENDERNESS IN MOUTH AND THROAT WITH OR WITHOUT PRESENCE OF ULCERS  *URINARY PROBLEMS  *BOWEL PROBLEMS  UNUSUAL RASH Items with * indicate a potential emergency and should be followed up as soon as possible.  Feel free to call the clinic you have any questions or concerns. The clinic phone number is [phone].    

## 2014-06-22 ENCOUNTER — Ambulatory Visit: Payer: Medicare Other

## 2014-06-22 ENCOUNTER — Other Ambulatory Visit (HOSPITAL_BASED_OUTPATIENT_CLINIC_OR_DEPARTMENT_OTHER): Payer: Medicare Other

## 2014-06-22 ENCOUNTER — Ambulatory Visit (HOSPITAL_BASED_OUTPATIENT_CLINIC_OR_DEPARTMENT_OTHER): Payer: Medicare Other

## 2014-06-22 VITALS — BP 137/77 | HR 91 | Temp 97.3°F | Resp 18

## 2014-06-22 DIAGNOSIS — C9000 Multiple myeloma not having achieved remission: Secondary | ICD-10-CM

## 2014-06-22 DIAGNOSIS — Z5112 Encounter for antineoplastic immunotherapy: Secondary | ICD-10-CM

## 2014-06-22 DIAGNOSIS — Z95828 Presence of other vascular implants and grafts: Secondary | ICD-10-CM

## 2014-06-22 LAB — COMPREHENSIVE METABOLIC PANEL (CC13)
ALBUMIN: 2.7 g/dL — AB (ref 3.5–5.0)
ALK PHOS: 56 U/L (ref 40–150)
ALT: 6 U/L (ref 0–55)
AST: 13 U/L (ref 5–34)
Anion Gap: 16 mEq/L — ABNORMAL HIGH (ref 3–11)
BUN: 16 mg/dL (ref 7.0–26.0)
CALCIUM: 9.6 mg/dL (ref 8.4–10.4)
CHLORIDE: 103 meq/L (ref 98–109)
CO2: 24 mEq/L (ref 22–29)
Creatinine: 0.9 mg/dL (ref 0.6–1.1)
Glucose: 128 mg/dl (ref 70–140)
POTASSIUM: 3.8 meq/L (ref 3.5–5.1)
Sodium: 144 mEq/L (ref 136–145)
Total Bilirubin: 0.73 mg/dL (ref 0.20–1.20)
Total Protein: 5.7 g/dL — ABNORMAL LOW (ref 6.4–8.3)

## 2014-06-22 LAB — CBC WITH DIFFERENTIAL/PLATELET
BASO%: 0.3 % (ref 0.0–2.0)
Basophils Absolute: 0 10*3/uL (ref 0.0–0.1)
EOS%: 9.7 % — ABNORMAL HIGH (ref 0.0–7.0)
Eosinophils Absolute: 0.7 10*3/uL — ABNORMAL HIGH (ref 0.0–0.5)
HCT: 32 % — ABNORMAL LOW (ref 34.8–46.6)
HGB: 10.3 g/dL — ABNORMAL LOW (ref 11.6–15.9)
LYMPH%: 5.8 % — ABNORMAL LOW (ref 14.0–49.7)
MCH: 31.4 pg (ref 25.1–34.0)
MCHC: 32.2 g/dL (ref 31.5–36.0)
MCV: 97.7 fL (ref 79.5–101.0)
MONO#: 0.5 10*3/uL (ref 0.1–0.9)
MONO%: 6.3 % (ref 0.0–14.0)
NEUT%: 77.9 % — ABNORMAL HIGH (ref 38.4–76.8)
NEUTROS ABS: 5.6 10*3/uL (ref 1.5–6.5)
Platelets: 189 10*3/uL (ref 145–400)
RBC: 3.28 10*6/uL — AB (ref 3.70–5.45)
RDW: 18 % — AB (ref 11.2–14.5)
WBC: 7.2 10*3/uL (ref 3.9–10.3)
lymph#: 0.4 10*3/uL — ABNORMAL LOW (ref 0.9–3.3)

## 2014-06-22 MED ORDER — SODIUM CHLORIDE 0.9 % IV SOLN
10.0000 mg/kg | Freq: Once | INTRAVENOUS | Status: AC
  Administered 2014-06-22: 695 mg via INTRAVENOUS
  Filled 2014-06-22: qty 27.8

## 2014-06-22 MED ORDER — ACETAMINOPHEN 325 MG PO TABS
ORAL_TABLET | ORAL | Status: AC
  Filled 2014-06-22: qty 2

## 2014-06-22 MED ORDER — SODIUM CHLORIDE 0.9 % IJ SOLN
10.0000 mL | INTRAMUSCULAR | Status: DC | PRN
  Administered 2014-06-22: 10 mL
  Filled 2014-06-22: qty 10

## 2014-06-22 MED ORDER — ACETAMINOPHEN 325 MG PO TABS
650.0000 mg | ORAL_TABLET | Freq: Once | ORAL | Status: AC
  Administered 2014-06-22: 650 mg via ORAL

## 2014-06-22 MED ORDER — SODIUM CHLORIDE 0.9 % IV SOLN
Freq: Once | INTRAVENOUS | Status: AC
  Administered 2014-06-22: 09:00:00 via INTRAVENOUS

## 2014-06-22 MED ORDER — FAMOTIDINE IN NACL 20-0.9 MG/50ML-% IV SOLN
INTRAVENOUS | Status: AC
Start: 2014-06-22 — End: 2014-06-22
  Filled 2014-06-22: qty 50

## 2014-06-22 MED ORDER — SODIUM CHLORIDE 0.9 % IJ SOLN
10.0000 mL | INTRAMUSCULAR | Status: DC | PRN
  Administered 2014-06-22: 10 mL via INTRAVENOUS
  Filled 2014-06-22: qty 10

## 2014-06-22 MED ORDER — DIPHENHYDRAMINE HCL 25 MG PO CAPS
50.0000 mg | ORAL_CAPSULE | Freq: Once | ORAL | Status: AC
  Administered 2014-06-22: 50 mg via ORAL

## 2014-06-22 MED ORDER — DIPHENHYDRAMINE HCL 25 MG PO CAPS
ORAL_CAPSULE | ORAL | Status: AC
  Filled 2014-06-22: qty 2

## 2014-06-22 MED ORDER — FAMOTIDINE IN NACL 20-0.9 MG/50ML-% IV SOLN
20.0000 mg | Freq: Once | INTRAVENOUS | Status: AC
  Administered 2014-06-22: 20 mg via INTRAVENOUS

## 2014-06-22 MED ORDER — HEPARIN SOD (PORK) LOCK FLUSH 100 UNIT/ML IV SOLN
500.0000 [IU] | Freq: Once | INTRAVENOUS | Status: AC | PRN
  Administered 2014-06-22: 500 [IU]
  Filled 2014-06-22: qty 5

## 2014-06-22 MED ORDER — DEXAMETHASONE SODIUM PHOSPHATE 10 MG/ML IJ SOLN
8.0000 mg | Freq: Once | INTRAMUSCULAR | Status: AC
  Administered 2014-06-22: 8 mg via INTRAVENOUS

## 2014-06-22 MED ORDER — DEXAMETHASONE SODIUM PHOSPHATE 10 MG/ML IJ SOLN
INTRAMUSCULAR | Status: AC
  Filled 2014-06-22: qty 1

## 2014-06-22 NOTE — Patient Instructions (Signed)
[facility] Discharge Instructions for Patients Receiving Chemotherapy  Today you received the following chemotherapy agents Elotuzumab  To help prevent nausea and vomiting after your treatment, we encourage you to take your nausea medication Zofran as directed If you develop nausea and vomiting that is not controlled by your nausea medication, call the clinic.   BELOW ARE SYMPTOMS THAT SHOULD BE REPORTED IMMEDIATELY:  *FEVER GREATER THAN 100.5 F  *CHILLS WITH OR WITHOUT FEVER  NAUSEA AND VOMITING THAT IS NOT CONTROLLED WITH YOUR NAUSEA MEDICATION  *UNUSUAL SHORTNESS OF BREATH  *UNUSUAL BRUISING OR BLEEDING  TENDERNESS IN MOUTH AND THROAT WITH OR WITHOUT PRESENCE OF ULCERS  *URINARY PROBLEMS  *BOWEL PROBLEMS  UNUSUAL RASH Items with * indicate a potential emergency and should be followed up as soon as possible.  Feel free to call the clinic you have any questions or concerns. The clinic phone number is [phone].

## 2014-06-22 NOTE — Patient Instructions (Signed)

## 2014-06-23 ENCOUNTER — Other Ambulatory Visit: Payer: Self-pay | Admitting: Hematology and Oncology

## 2014-06-25 ENCOUNTER — Other Ambulatory Visit: Payer: Self-pay | Admitting: *Deleted

## 2014-06-26 ENCOUNTER — Encounter: Payer: Self-pay | Admitting: Family Medicine

## 2014-06-26 ENCOUNTER — Ambulatory Visit (INDEPENDENT_AMBULATORY_CARE_PROVIDER_SITE_OTHER): Payer: Medicare Other | Admitting: Family Medicine

## 2014-06-26 VITALS — BP 139/81 | HR 114 | Temp 97.7°F | Wt 143.0 lb

## 2014-06-26 DIAGNOSIS — C9000 Multiple myeloma not having achieved remission: Secondary | ICD-10-CM

## 2014-06-26 DIAGNOSIS — J209 Acute bronchitis, unspecified: Secondary | ICD-10-CM

## 2014-06-26 LAB — SPEP & IFE WITH QIG
Albumin ELP: 53 % — ABNORMAL LOW (ref 55.8–66.1)
Alpha-1-Globulin: 8.6 % — ABNORMAL HIGH (ref 2.9–4.9)
Alpha-2-Globulin: 14.7 % — ABNORMAL HIGH (ref 7.1–11.8)
Beta 2: 3.1 % — ABNORMAL LOW (ref 3.2–6.5)
Beta Globulin: 14.2 % — ABNORMAL HIGH (ref 4.7–7.2)
Gamma Globulin: 6.4 % — ABNORMAL LOW (ref 11.1–18.8)
IGA: 225 mg/dL (ref 69–380)
IGG (IMMUNOGLOBIN G), SERUM: 327 mg/dL — AB (ref 690–1700)
IgM, Serum: 6 mg/dL — ABNORMAL LOW (ref 52–322)
M-SPIKE, %: 0.4 g/dL
TOTAL PROTEIN, SERUM ELECTROPHOR: 5.3 g/dL — AB (ref 6.0–8.3)

## 2014-06-26 LAB — KAPPA/LAMBDA LIGHT CHAINS
KAPPA FREE LGHT CHN: 2.64 mg/dL — AB (ref 0.33–1.94)
Kappa:Lambda Ratio: 17.6 — ABNORMAL HIGH (ref 0.26–1.65)
LAMBDA FREE LGHT CHN: 0.15 mg/dL — AB (ref 0.57–2.63)

## 2014-06-26 MED ORDER — HYDROCODONE-HOMATROPINE 5-1.5 MG/5ML PO SYRP: 5.0000 mL | ORAL_SOLUTION | Freq: Every evening | ORAL | Status: DC | PRN

## 2014-06-26 MED ORDER — LEVOFLOXACIN 750 MG PO TABS: 750.0000 mg | ORAL_TABLET | Freq: Every day | ORAL | Status: DC

## 2014-06-26 MED ORDER — DOUBLE ANTIBIOTIC 500-10000 UNIT/GM EX OINT: 1.0000 "application " | TOPICAL_OINTMENT | Freq: Two times a day (BID) | CUTANEOUS | Status: AC

## 2014-06-26 NOTE — Progress Notes (Signed)
   Subjective:    Patient ID: [name], female    DOB: 1936/01/03, 78 y.o.   MRN: [id number]  Cough Associated symptoms include shortness of breath.  Shortness of Breath   2 weeks of cough w/ yellow productive sxs.  No fever, or ST.  + SOB.  No facial pain. + nasal congestion.  Cough is worse at night.  She is currently undergoing chemotherapy for multiple myeloma. She did have a chest x-ray done about 6 weeks ago which shows that she had some consolidation bilaterally in both lungs as well as some pleural thickening most likely related to her cancer.   Review of Systems  Respiratory: Positive for cough and shortness of breath.        Objective:   Physical Exam  Constitutional: She is oriented to person, place, and time. She appears well-developed and well-nourished.  HENT:  Head: Normocephalic and atraumatic.  Right Ear: External ear normal.  Left Ear: External ear normal.  Nose: Nose normal.  Mouth/Throat: Oropharynx is clear and moist.  TMs and canals are clear. + diaphoretic  Eyes: Conjunctivae and EOM are normal. Pupils are equal, round, and reactive to light.  Neck: Neck supple. No thyromegaly present.  Cardiovascular: Normal rate, regular rhythm and normal heart sounds.   Pulmonary/Chest: Effort normal and breath sounds normal. She has no wheezes.  No crackles. No rhonchi. She did have some mild expiratory wheezing on the anterior lung fields but not on the posterior lung fields.  Lymphadenopathy:    She has no cervical adenopathy.  Neurological: She is alert and oriented to person, place, and time.  Skin: Skin is warm and dry.  Psychiatric: She has a normal mood and affect.          Assessment & Plan:  Acute bronchitis versus pneumonia-I. encouraged her to get a chest x-ray today but she really did not want to. We'll go ahead and cover for pneumonia with Levaquin 750 mg once a day x5 days. If she's not responding, suddenly gets worse, is not improving then  encouraged her to give the office a call back so that we can at least get a chest x-ray. Encouraged her to stay hydrated and continue work on getting calories in. Also given prescription cough medicine for bedtime. Warned about potential for sedation

## 2014-06-28 ENCOUNTER — Ambulatory Visit (HOSPITAL_BASED_OUTPATIENT_CLINIC_OR_DEPARTMENT_OTHER): Payer: Medicare Other | Admitting: Hematology and Oncology

## 2014-06-28 ENCOUNTER — Inpatient Hospital Stay (HOSPITAL_COMMUNITY)
Admission: EM | Admit: 2014-06-28 | Discharge: 2014-07-05 | DRG: 308 | Disposition: A | Discharge status: Hospice | Payer: Medicare Other | Attending: Internal Medicine | Admitting: Internal Medicine

## 2014-06-28 ENCOUNTER — Ambulatory Visit (HOSPITAL_BASED_OUTPATIENT_CLINIC_OR_DEPARTMENT_OTHER): Payer: Medicare Other

## 2014-06-28 ENCOUNTER — Encounter: Payer: Self-pay | Admitting: Hematology and Oncology

## 2014-06-28 ENCOUNTER — Emergency Department (HOSPITAL_COMMUNITY): Payer: Medicare Other

## 2014-06-28 ENCOUNTER — Other Ambulatory Visit (HOSPITAL_BASED_OUTPATIENT_CLINIC_OR_DEPARTMENT_OTHER): Payer: Medicare Other | Admitting: Hematology and Oncology

## 2014-06-28 ENCOUNTER — Telehealth: Payer: Self-pay | Admitting: *Deleted

## 2014-06-28 ENCOUNTER — Telehealth: Payer: Self-pay | Admitting: Hematology and Oncology

## 2014-06-28 ENCOUNTER — Other Ambulatory Visit (HOSPITAL_BASED_OUTPATIENT_CLINIC_OR_DEPARTMENT_OTHER): Payer: Medicare Other

## 2014-06-28 ENCOUNTER — Encounter (HOSPITAL_COMMUNITY): Payer: Self-pay | Admitting: Emergency Medicine

## 2014-06-28 VITALS — BP 119/76 | HR 129 | Temp 97.5°F | Resp 20 | Ht 66.0 in | Wt 143.0 lb

## 2014-06-28 DIAGNOSIS — I480 Paroxysmal atrial fibrillation: Principal | ICD-10-CM

## 2014-06-28 DIAGNOSIS — E876 Hypokalemia: Secondary | ICD-10-CM

## 2014-06-28 DIAGNOSIS — Z9181 History of falling: Secondary | ICD-10-CM

## 2014-06-28 DIAGNOSIS — I5033 Acute on chronic diastolic (congestive) heart failure: Secondary | ICD-10-CM | POA: Diagnosis not present

## 2014-06-28 DIAGNOSIS — J189 Pneumonia, unspecified organism: Secondary | ICD-10-CM | POA: Diagnosis present

## 2014-06-28 DIAGNOSIS — M1990 Unspecified osteoarthritis, unspecified site: Secondary | ICD-10-CM | POA: Diagnosis present

## 2014-06-28 DIAGNOSIS — M8580 Other specified disorders of bone density and structure, unspecified site: Secondary | ICD-10-CM | POA: Diagnosis present

## 2014-06-28 DIAGNOSIS — I4949 Other premature depolarization: Secondary | ICD-10-CM | POA: Diagnosis not present

## 2014-06-28 DIAGNOSIS — Z8679 Personal history of other diseases of the circulatory system: Secondary | ICD-10-CM

## 2014-06-28 DIAGNOSIS — IMO0001 Reserved for inherently not codable concepts without codable children: Secondary | ICD-10-CM

## 2014-06-28 DIAGNOSIS — R0781 Pleurodynia: Secondary | ICD-10-CM

## 2014-06-28 DIAGNOSIS — L658 Other specified nonscarring hair loss: Secondary | ICD-10-CM | POA: Diagnosis present

## 2014-06-28 DIAGNOSIS — D6959 Other secondary thrombocytopenia: Secondary | ICD-10-CM | POA: Diagnosis present

## 2014-06-28 DIAGNOSIS — J9601 Acute respiratory failure with hypoxia: Secondary | ICD-10-CM | POA: Diagnosis present

## 2014-06-28 DIAGNOSIS — I10 Essential (primary) hypertension: Secondary | ICD-10-CM | POA: Diagnosis present

## 2014-06-28 DIAGNOSIS — Z6823 Body mass index (BMI) 23.0-23.9, adult: Secondary | ICD-10-CM

## 2014-06-28 DIAGNOSIS — Z8249 Family history of ischemic heart disease and other diseases of the circulatory system: Secondary | ICD-10-CM | POA: Diagnosis not present

## 2014-06-28 DIAGNOSIS — Z7982 Long term (current) use of aspirin: Secondary | ICD-10-CM

## 2014-06-28 DIAGNOSIS — F32A Depression, unspecified: Secondary | ICD-10-CM

## 2014-06-28 DIAGNOSIS — Z833 Family history of diabetes mellitus: Secondary | ICD-10-CM | POA: Diagnosis not present

## 2014-06-28 DIAGNOSIS — I4891 Unspecified atrial fibrillation: Secondary | ICD-10-CM

## 2014-06-28 DIAGNOSIS — Z79899 Other long term (current) drug therapy: Secondary | ICD-10-CM

## 2014-06-28 DIAGNOSIS — D638 Anemia in other chronic diseases classified elsewhere: Secondary | ICD-10-CM | POA: Diagnosis present

## 2014-06-28 DIAGNOSIS — D62 Acute posthemorrhagic anemia: Secondary | ICD-10-CM | POA: Diagnosis present

## 2014-06-28 DIAGNOSIS — T451X5S Adverse effect of antineoplastic and immunosuppressive drugs, sequela: Secondary | ICD-10-CM | POA: Diagnosis not present

## 2014-06-28 DIAGNOSIS — IMO0002 Reserved for concepts with insufficient information to code with codable children: Secondary | ICD-10-CM

## 2014-06-28 DIAGNOSIS — D630 Anemia in neoplastic disease: Secondary | ICD-10-CM

## 2014-06-28 DIAGNOSIS — E43 Unspecified severe protein-calorie malnutrition: Secondary | ICD-10-CM | POA: Diagnosis present

## 2014-06-28 DIAGNOSIS — E785 Hyperlipidemia, unspecified: Secondary | ICD-10-CM | POA: Diagnosis present

## 2014-06-28 DIAGNOSIS — J069 Acute upper respiratory infection, unspecified: Secondary | ICD-10-CM | POA: Diagnosis present

## 2014-06-28 DIAGNOSIS — C9000 Multiple myeloma not having achieved remission: Secondary | ICD-10-CM

## 2014-06-28 DIAGNOSIS — C801 Malignant (primary) neoplasm, unspecified: Secondary | ICD-10-CM

## 2014-06-28 DIAGNOSIS — Z888 Allergy status to other drugs, medicaments and biological substances status: Secondary | ICD-10-CM | POA: Diagnosis not present

## 2014-06-28 DIAGNOSIS — Z66 Do not resuscitate: Secondary | ICD-10-CM | POA: Diagnosis present

## 2014-06-28 DIAGNOSIS — J910 Malignant pleural effusion: Secondary | ICD-10-CM | POA: Diagnosis present

## 2014-06-28 DIAGNOSIS — M899 Disorder of bone, unspecified: Secondary | ICD-10-CM

## 2014-06-28 DIAGNOSIS — J9811 Atelectasis: Secondary | ICD-10-CM | POA: Diagnosis present

## 2014-06-28 DIAGNOSIS — D500 Iron deficiency anemia secondary to blood loss (chronic): Secondary | ICD-10-CM | POA: Diagnosis present

## 2014-06-28 DIAGNOSIS — F419 Anxiety disorder, unspecified: Secondary | ICD-10-CM

## 2014-06-28 DIAGNOSIS — K5901 Slow transit constipation: Secondary | ICD-10-CM

## 2014-06-28 DIAGNOSIS — M87180 Osteonecrosis due to drugs, jaw: Secondary | ICD-10-CM

## 2014-06-28 DIAGNOSIS — M898X9 Other specified disorders of bone, unspecified site: Secondary | ICD-10-CM

## 2014-06-28 DIAGNOSIS — R300 Dysuria: Secondary | ICD-10-CM

## 2014-06-28 DIAGNOSIS — F329 Major depressive disorder, single episode, unspecified: Secondary | ICD-10-CM

## 2014-06-28 DIAGNOSIS — J90 Pleural effusion, not elsewhere classified: Secondary | ICD-10-CM

## 2014-06-28 DIAGNOSIS — R590 Localized enlarged lymph nodes: Secondary | ICD-10-CM

## 2014-06-28 DIAGNOSIS — E440 Moderate protein-calorie malnutrition: Secondary | ICD-10-CM | POA: Diagnosis present

## 2014-06-28 DIAGNOSIS — D649 Anemia, unspecified: Secondary | ICD-10-CM

## 2014-06-28 HISTORY — DX: Unspecified atrial fibrillation: I48.91

## 2014-06-28 LAB — COMPREHENSIVE METABOLIC PANEL (CC13)
ALT: 11 U/L (ref 0–55)
AST: 12 U/L (ref 5–34)
Albumin: 3 g/dL — ABNORMAL LOW (ref 3.5–5.0)
Alkaline Phosphatase: 57 U/L (ref 40–150)
Anion Gap: 21 mEq/L — ABNORMAL HIGH (ref 3–11)
BILIRUBIN TOTAL: 0.82 mg/dL (ref 0.20–1.20)
BUN: 11 mg/dL (ref 7.0–26.0)
CO2: 20 mEq/L — ABNORMAL LOW (ref 22–29)
Calcium: 10 mg/dL (ref 8.4–10.4)
Chloride: 105 mEq/L (ref 98–109)
Creatinine: 0.8 mg/dL (ref 0.6–1.1)
Glucose: 99 mg/dl (ref 70–140)
Potassium: 3.3 mEq/L — ABNORMAL LOW (ref 3.5–5.1)
Sodium: 146 mEq/L — ABNORMAL HIGH (ref 136–145)
Total Protein: 5.8 g/dL — ABNORMAL LOW (ref 6.4–8.3)

## 2014-06-28 LAB — CBC
HEMATOCRIT: 31.2 % — AB (ref 36.0–46.0)
Hemoglobin: 10.1 g/dL — ABNORMAL LOW (ref 12.0–15.0)
MCH: 31.8 pg (ref 26.0–34.0)
MCHC: 32.4 g/dL (ref 30.0–36.0)
MCV: 98.1 fL (ref 78.0–100.0)
Platelets: 155 10*3/uL (ref 150–400)
RBC: 3.18 MIL/uL — ABNORMAL LOW (ref 3.87–5.11)
RDW: 17.5 % — ABNORMAL HIGH (ref 11.5–15.5)
WBC: 8.7 10*3/uL (ref 4.0–10.5)

## 2014-06-28 LAB — CBC WITH DIFFERENTIAL/PLATELET
BASO%: 1.1 % (ref 0.0–2.0)
BASOS ABS: 0.1 10*3/uL (ref 0.0–0.1)
EOS%: 9 % — ABNORMAL HIGH (ref 0.0–7.0)
Eosinophils Absolute: 0.8 10*3/uL — ABNORMAL HIGH (ref 0.0–0.5)
HEMATOCRIT: 36 % (ref 34.8–46.6)
HEMOGLOBIN: 11.4 g/dL — AB (ref 11.6–15.9)
LYMPH#: 1.1 10*3/uL (ref 0.9–3.3)
LYMPH%: 11.6 % — ABNORMAL LOW (ref 14.0–49.7)
MCH: 31.2 pg (ref 25.1–34.0)
MCHC: 31.7 g/dL (ref 31.5–36.0)
MCV: 98.2 fL (ref 79.5–101.0)
MONO#: 1.1 10*3/uL — ABNORMAL HIGH (ref 0.1–0.9)
MONO%: 11.5 % (ref 0.0–14.0)
NEUT#: 6.1 10*3/uL (ref 1.5–6.5)
NEUT%: 66.8 % (ref 38.4–76.8)
Platelets: 193 10*3/uL (ref 145–400)
RBC: 3.67 10*6/uL — ABNORMAL LOW (ref 3.70–5.45)
RDW: 18 % — ABNORMAL HIGH (ref 11.2–14.5)
WBC: 9.2 10*3/uL (ref 3.9–10.3)

## 2014-06-28 LAB — CREATININE, SERUM
Creatinine, Ser: 0.75 mg/dL (ref 0.50–1.10)
GFR calc Af Amer: >90 mL/min (ref >90)
GFR, EST NON AFRICAN AMERICAN: 80 mL/min — AB (ref >90)

## 2014-06-28 LAB — PRO B NATRIURETIC PEPTIDE: Pro B Natriuretic peptide (BNP): 2526 pg/mL — ABNORMAL HIGH (ref 0–450)

## 2014-06-28 LAB — I-STAT TROPONIN, ED: Troponin i, poc: 0.04 ng/mL (ref 0.00–0.08)

## 2014-06-28 LAB — TROPONIN I: Troponin I: <0.3 ng/mL (ref <0.30)

## 2014-06-28 MED ORDER — HYDROCODONE-HOMATROPINE 5-1.5 MG/5ML PO SYRP
5.0000 mL | ORAL_SOLUTION | Freq: Four times a day (QID) | ORAL | Status: DC | PRN
  Administered 2014-06-28 – 2014-07-02 (×4): 5 mL via ORAL
  Filled 2014-06-28 (×4): qty 5

## 2014-06-28 MED ORDER — SODIUM CHLORIDE 0.9 % IJ SOLN
10.0000 mL | INTRAMUSCULAR | Status: DC | PRN
  Administered 2014-06-28: 10 mL via INTRAVENOUS
  Filled 2014-06-28: qty 10

## 2014-06-28 MED ORDER — ACYCLOVIR 400 MG PO TABS
400.0000 mg | ORAL_TABLET | Freq: Every day | ORAL | Status: DC
  Administered 2014-06-29 – 2014-07-05 (×7): 400 mg via ORAL
  Filled 2014-06-28 (×7): qty 1

## 2014-06-28 MED ORDER — ONDANSETRON HCL 4 MG/2ML IJ SOLN: 4.0000 mg | Freq: Four times a day (QID) | INTRAMUSCULAR | Status: DC | PRN

## 2014-06-28 MED ORDER — ATORVASTATIN CALCIUM 20 MG PO TABS
20.0000 mg | ORAL_TABLET | Freq: Every day | ORAL | Status: DC
  Administered 2014-06-28 – 2014-07-04 (×7): 20 mg via ORAL
  Filled 2014-06-28: qty 2
  Filled 2014-06-28: qty 1
  Filled 2014-06-28: qty 2
  Filled 2014-06-28 (×5): qty 1

## 2014-06-28 MED ORDER — DEXTROSE 5 % IV SOLN
10.0000 mg/h | INTRAVENOUS | Status: DC
Start: 2014-06-28 — End: 2014-06-30
  Administered 2014-06-28: 10 mg/h via INTRAVENOUS
  Administered 2014-06-29 – 2014-06-30 (×2): 5 mg/h via INTRAVENOUS

## 2014-06-28 MED ORDER — DILTIAZEM HCL 100 MG IV SOLR
5.0000 mg/h | INTRAVENOUS | Status: DC
  Administered 2014-06-28: 10 mg/h via INTRAVENOUS
  Administered 2014-06-28: 5 mg/h via INTRAVENOUS
  Filled 2014-06-28: qty 100

## 2014-06-28 MED ORDER — SODIUM CHLORIDE 0.9 % IV BOLUS (SEPSIS)
250.0000 mL | Freq: Once | INTRAVENOUS | Status: AC
  Administered 2014-06-28: 250 mL via INTRAVENOUS

## 2014-06-28 MED ORDER — DILTIAZEM LOAD VIA INFUSION
20.0000 mg | Freq: Once | INTRAVENOUS | Status: AC
  Administered 2014-06-28: 20 mg via INTRAVENOUS
  Filled 2014-06-28: qty 20

## 2014-06-28 MED ORDER — DEXTROSE 5 % IV SOLN
500.0000 mg | INTRAVENOUS | Status: DC
  Administered 2014-06-28 – 2014-07-04 (×7): 500 mg via INTRAVENOUS
  Filled 2014-06-28 (×8): qty 500

## 2014-06-28 MED ORDER — SODIUM CHLORIDE 0.9 % IJ SOLN
3.0000 mL | Freq: Two times a day (BID) | INTRAMUSCULAR | Status: DC
  Administered 2014-06-29: 3 mL via INTRAVENOUS

## 2014-06-28 MED ORDER — BISACODYL 10 MG RE SUPP: 10.0000 mg | Freq: Every day | RECTAL | Status: DC | PRN

## 2014-06-28 MED ORDER — ADULT MULTIVITAMIN W/MINERALS CH
1.0000 | ORAL_TABLET | Freq: Every day | ORAL | Status: DC
  Administered 2014-06-29 – 2014-07-05 (×7): 1 via ORAL
  Filled 2014-06-28 (×7): qty 1

## 2014-06-28 MED ORDER — DILTIAZEM LOAD VIA INFUSION
10.0000 mg | Freq: Once | INTRAVENOUS | Status: AC
  Administered 2014-06-28: 10 mg via INTRAVENOUS

## 2014-06-28 MED ORDER — DILTIAZEM HCL 25 MG/5ML IV SOLN
20.0000 mg | Freq: Once | INTRAVENOUS | Status: DC
  Filled 2014-06-28: qty 5

## 2014-06-28 MED ORDER — ASPIRIN EC 81 MG PO TBEC
81.0000 mg | DELAYED_RELEASE_TABLET | Freq: Every day | ORAL | Status: DC
  Administered 2014-06-29 – 2014-07-05 (×7): 81 mg via ORAL
  Filled 2014-06-28 (×7): qty 1

## 2014-06-28 MED ORDER — ALPRAZOLAM 1 MG PO TABS
1.0000 mg | ORAL_TABLET | Freq: Every evening | ORAL | Status: DC | PRN
  Administered 2014-06-28 – 2014-07-04 (×7): 1 mg via ORAL
  Filled 2014-06-28 (×7): qty 1

## 2014-06-28 MED ORDER — LENALIDOMIDE 10 MG PO CAPS: 10.0000 mg | ORAL_CAPSULE | Freq: Every day | ORAL | Status: DC

## 2014-06-28 MED ORDER — HEPARIN SOD (PORK) LOCK FLUSH 100 UNIT/ML IV SOLN
500.0000 [IU] | Freq: Once | INTRAVENOUS | Status: AC
  Administered 2014-06-28: 500 [IU] via INTRAVENOUS
  Filled 2014-06-28: qty 5

## 2014-06-28 MED ORDER — DEXAMETHASONE 2 MG PO TABS: 12.0000 mg | ORAL_TABLET | ORAL | Status: DC

## 2014-06-28 MED ORDER — GUAIFENESIN ER 600 MG PO TB12
600.0000 mg | ORAL_TABLET | Freq: Two times a day (BID) | ORAL | Status: DC
  Administered 2014-06-28 – 2014-07-05 (×14): 600 mg via ORAL
  Filled 2014-06-28 (×17): qty 1

## 2014-06-28 MED ORDER — ALUM & MAG HYDROXIDE-SIMETH 200-200-20 MG/5ML PO SUSP
30.0000 mL | Freq: Four times a day (QID) | ORAL | Status: DC | PRN
  Administered 2014-06-30: 30 mL via ORAL
  Filled 2014-06-28: qty 30

## 2014-06-28 MED ORDER — HEPARIN SODIUM (PORCINE) 5000 UNIT/ML IJ SOLN
5000.0000 [IU] | Freq: Three times a day (TID) | INTRAMUSCULAR | Status: DC
  Administered 2014-06-29 – 2014-07-05 (×19): 5000 [IU] via SUBCUTANEOUS
  Filled 2014-06-28 (×21): qty 1

## 2014-06-28 MED ORDER — MAGNESIUM CITRATE PO SOLN: 1.0000 | Freq: Once | ORAL | Status: AC | PRN

## 2014-06-28 MED ORDER — SENNA 8.6 MG PO TABS
1.0000 | ORAL_TABLET | Freq: Two times a day (BID) | ORAL | Status: DC
  Administered 2014-06-28 – 2014-07-05 (×8): 8.6 mg via ORAL
  Filled 2014-06-28 (×12): qty 1

## 2014-06-28 MED ORDER — OXYCODONE HCL 5 MG PO TABS
5.0000 mg | ORAL_TABLET | ORAL | Status: DC | PRN
  Administered 2014-06-28 – 2014-07-05 (×15): 5 mg via ORAL
  Filled 2014-06-28 (×15): qty 1

## 2014-06-28 MED ORDER — ACETAMINOPHEN 650 MG RE SUPP: 650.0000 mg | Freq: Four times a day (QID) | RECTAL | Status: DC | PRN

## 2014-06-28 MED ORDER — ACETAMINOPHEN 325 MG PO TABS
650.0000 mg | ORAL_TABLET | Freq: Four times a day (QID) | ORAL | Status: DC | PRN
  Administered 2014-07-02: 650 mg via ORAL
  Filled 2014-06-28: qty 2

## 2014-06-28 MED ORDER — ONDANSETRON HCL 4 MG PO TABS: 4.0000 mg | ORAL_TABLET | Freq: Four times a day (QID) | ORAL | Status: DC | PRN

## 2014-06-28 NOTE — Assessment & Plan Note (Signed)
This is likely triggered by recent infection. Clinically, she has cardiovascular instability. I will draw blood cultures to exclude infection. She is treated now for bronchitis by her PCP. I would direct her to the emergency department for triage and admission.

## 2014-06-28 NOTE — ED Notes (Signed)
Pt presented from the cancer center, beside report remarks on pt being brought in after consulting ED; pt c/o shortness of breadth, vitals signs reveal sinus tach and EKG at the cancer center showed A-Fib new onset.

## 2014-06-28 NOTE — Assessment & Plan Note (Signed)
Her treatment will be placed on hold. I am directing her to the emergency department for workup and admission today.

## 2014-06-28 NOTE — Assessment & Plan Note (Signed)
This is likely anemia of chronic disease. The patient denies recent history of bleeding such as epistaxis, hematuria or hematochezia. She is asymptomatic from the anemia. We will observe for now.  She does not require transfusion now.   

## 2014-06-28 NOTE — ED Provider Notes (Signed)
CSN: [id number]     Arrival date & time 06/28/14  1605 History   First MD Initiated Contact with Patient 06/28/14 1612     Chief Complaint  Patient presents with  . Atrial Fibrillation  . Oncology Pt   . Tachycardia     (Consider location/radiation/quality/duration/timing/severity/associated sxs/prior Treatment) HPI Pt is a 78yo female with hx of multiple myeloma sent from [facility] with new onset afib with RVR.  Pt reports "feeling bad" for last 3-4 days, and found to have afib today.  Labs were drawn at cancer center but no tx PTA.  Pt states she has felt gradually worsening generalized weakness as well as bilateral leg fatigue and weakness over last 3-4 days, making it more difficult to walk, with associated SOB and "feels like my heart is racing at times."   Denies chest pain. Denies previous hx of CAD, afib or lung problems including asthma or COPD.  Pt states she has been on antibiotics recently but does not believe that is what caused her symptoms.  Pt does mention she is on "trial drugs."  Denies fever, chills, n/v/d.    Past Medical History  Diagnosis Date  . Anxiety   . Hypertension   . Hyperlipidemia   . Depression   . Osteopenia   . Acute renal failure      due to hypercalcemia and suspected multiple myeloma resolved =with IVF's    . Lytic bone lesions on xray   . Acute subdural hematoma 03/2012     recent fall in hospital stay,resolved+  . Arthritis     spine  . Atrial fibrillation   . Radiation 05/09/12-05/18/12    20 gray in 8 Fx's right hip palliative  . Status post chemotherapy     velcade and revlimid  . Drug-induced osteonecrosis of jaw   . Multiple myeloma 03/2012  . Cancer     mul;tiple myeloma, bone lesions r 7th rib,   . Lymphadenopathy, axillary 03/08/2014  . Normocytic anemia     due to chemo and multiple myeloma,no active bleding  . Mucositis (ulcerative) due to antineoplastic therapy 05/02/2014  . Dysuria 05/02/2014  . Pleural effusion 05/11/2014  .  Atrial fibrillation with RVR 06/28/2014   Past Surgical History  Procedure Laterality Date  . 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 Tonsillectomy      age 77  . Breast lumpectomy  1962    lt bx-neg  . Port a cath revision      put in 6/15  . Axillary lymph node biopsy Right 04/11/2014    Procedure: RIGHT AXILLARY LYMPH NODE BIOPSY;  Surgeon: Joyice Faster. Cornett, MD;  Location: [facility];  Service: General;  Laterality: Right;   Family History  Problem Relation Age of Onset  . Heart disease Mother   . Hyperlipidemia Sister   . Hypertension Sister   . Diabetes Sister   . Diabetes Sister   . Hyperlipidemia Sister   . Hypertension Sister   . Sudden death Neg Hx   . Heart attack Neg Hx    History  Substance Use Topics  . Smoking status: Never Smoker   . Smokeless tobacco: Never Used  . Alcohol Use: No   OB History   Grav Para Term Preterm Abortions TAB SAB Ect Mult Living                 Review of Systems  Constitutional: Positive for fatigue. Negative for fever and chills.  Respiratory: Positive for cough and shortness of breath.   Cardiovascular: Positive for palpitations ( "my heart is racing"). Negative for chest pain.  Gastrointestinal: Negative for nausea and vomiting.  Neurological: Positive for weakness ( generalized).  All other systems reviewed and are negative.     Allergies  Simvastatin and Zometa  Home Medications   Prior to Admission medications   Medication Sig Start Date End Date Taking? Authorizing Provider  acyclovir (ZOVIRAX) 400 MG tablet Take 400 mg by mouth daily.   Yes Historical Provider, MD  ALPRAZolam Duanne Moron) 1 MG tablet Take 1 mg by mouth at bedtime as needed for anxiety (anxiety).   Yes Historical Provider, MD  aspirin EC 81 MG tablet Take 81 mg by mouth daily.   Yes Historical Provider, MD  Calcium Carbonate-Vitamin D (CALCIUM  500 + D PO) Take 1 tablet by mouth 2 (two) times daily.   Yes Historical Provider, MD  dexamethasone (DECADRON) 4 MG tablet Take 12 mg by mouth once a week.   Yes Historical Provider, MD  HYDROcodone-homatropine (HYCODAN) 5-1.5 MG/5ML syrup Take 5 mLs by mouth every 6 (six) hours as needed for cough (trouble sleeping).   Yes Historical Provider, MD  lenalidomide (REVLIMID) 10 MG capsule Take 1 capsule (10 mg total) by mouth daily. 06/11/14  Yes Heath Lark, MD  levofloxacin (LEVAQUIN) 750 MG tablet Take 1 tablet (750 mg total) by mouth daily. 06/26/14  Yes Hali Marry, MD  losartan (COZAAR) 50 MG tablet Take 50 mg by mouth daily.   Yes Historical Provider, MD  Multiple Vitamin (MULTIVITAMIN WITH MINERALS) TABS Take 1 tablet by mouth daily.   Yes Historical Provider, MD  polymixin-bacitracin (POLYSPORIN) 500-10000 UNIT/GM OINT ointment Apply 1 application topically 2 (two) times daily. Generic ok 06/26/14  Yes Hali Marry, MD  potassium chloride SA (K-DUR,KLOR-CON) 20 MEQ tablet Take 1 tablet (20 mEq total) by mouth 2 (two) times daily. 05/09/14  Yes Heath Lark, MD  rosuvastatin (CRESTOR) 10 MG tablet Take 10 mg by mouth at bedtime.   Yes Historical Provider, MD  tetrahydrozoline 0.05 % ophthalmic solution Place 1 drop into both eyes 2 (two) times daily as needed (redness/dry eyes).   Yes Historical Provider, MD  oxyCODONE (ROXICODONE) 5 MG immediate release tablet Take 1 tablet (5 mg total) by mouth every 4 (four) hours as needed for severe pain. 06/15/14   Heath Lark, MD  traZODone (DESYREL) 50 MG tablet Take 1 tablet (50 mg total) by mouth at bedtime as needed for sleep. 02/23/14   Ni Gorsuch, MD   BP 112/50  Pulse 79  Temp(Src) 98 F (36.7 C) (Oral)  Resp 25  Ht $R'5\' 6"'cB$  (1.676 m)  Wt 147 lb 4.3 oz (66.8 kg)  BMI 23.78 kg/m2  SpO2 92% Physical Exam  Nursing note and vitals reviewed. Constitutional: No distress.  Chronically ill appearing elderly female lying in exam bed, mildly  anxious appearing.   HENT:  Head: Normocephalic and atraumatic.  Eyes: Conjunctivae are normal. No scleral icterus.  Neck: Normal range of motion.  Cardiovascular: Normal heart sounds.  An irregularly irregular rhythm present. Tachycardia present.   Pulmonary/Chest: Effort normal. No respiratory distress. She has wheezes ( mild expiratory wheeze bibasilarly ). She has no rales. She exhibits no tenderness.  No respiratory distress. Able to speak in full sentences. No crackles or rales, mild expiratory wheeze in lower lung fields.   Abdominal: Soft. Bowel sounds are normal.  She exhibits no distension and no mass. There is no tenderness. There is no rebound and no guarding.  Musculoskeletal: Normal range of motion.  Neurological: She is alert.  Skin: Skin is warm and dry. She is not diaphoretic.    ED Course  Procedures (including critical care time) Labs Review Labs Reviewed  PRO B NATRIURETIC PEPTIDE - Abnormal; Notable for the following:    Pro B Natriuretic peptide (BNP) 2526.0 (*)    All other components within normal limits  CBC - Abnormal; Notable for the following:    RBC 3.18 (*)    Hemoglobin 10.1 (*)    HCT 31.2 (*)    RDW 17.5 (*)    All other components within normal limits  CREATININE, SERUM - Abnormal; Notable for the following:    GFR calc non Af Amer 80 (*)    All other components within normal limits  MRSA PCR SCREENING  TROPONIN I  BASIC METABOLIC PANEL  CBC  TROPONIN I  TROPONIN I  I-STAT TROPOININ, ED    Imaging Review Dg Chest Portable 1 View  06/28/2014   CLINICAL DATA:  Cough and shortness of breath for 1 week, history of atrial fibrillation and multiple myeloma  EXAM: PORTABLE CHEST - 1 VIEW  COMPARISON:  05/17/2014 ,04/19/2014  FINDINGS: Cardiac shadow is mildly prominent. An enlarging right-sided pleural effusion is seen. Right-sided chest port is again noted and stable. The left lung is clear. Old rib fractures are noted on the left. There is  irregularity of the seventh rib on the right similar to that seen on prior CT.  IMPRESSION: Enlarging right-sided pleural effusion.  Chronic changes.   Electronically Signed   By: Inez Catalina M.D.   On: 06/28/2014 16:47     EKG Interpretation None      MDM   Final diagnoses:  Atrial fibrillation with RVR  Essential hypertension  Hyperlipidemia    Pt presenting to ED with new onset afib with RVR, rate of 162 bpm on ECG, up to 165 on monitor.  Pt appears chronically ill and mildly SOB but able to speak in full sentences. No crackles or rales in lungs, but mild bibasilar expiratory wheeze.      CBC drawn at cancer center just PTA: unremarkable BMP: mild hypokalemia 3.3, CO2: 20 (down from 24), anion gap 21  4:54 PM PT, troponin, single blood culture, CK & CKMB, APTT, and TSH collected PTA at cancer center. No yet resulted.   istat troponin, BNP and CXR ordered in ED.   CXR: enlarging right-sided pleural effusion.   Pt given Cardizem bolus of $Remove'20mg'skDhKzD$  and started on Cardizem drip 81mL/hr 5:01 PM HR improving 110s-120s, BP-123/69.   5:23 PM Dr. Zenia Resides has consulted with Cardiology, will consult with hospitalist to admit for new onset afib with RVR    PCP: Dr. Madilyn Fireman   5:38 PM Consulted with Dr. Waldron Labs, hospitalist and rechecked pt's vitals during consult, pt's HR-160-170s and BP 101/72.  Dr. Waldron Labs advised he will come examine pt but requested additional cardiology consult as he is concerned pt is in decompensating afib.  Pt given another bolus of 10 Cardizem and $RemoveBefo'250mg'ylsFcbibwBb$  bolus IV fluids.  Pt currently on 34mL/hr Cardizem.   5:47PM consulted with Dr. Waldron Labs who evaluated pt and stated he will place admission orders for pt as she is stable at this time.      Noland Fordyce, PA-C 06/29/14 [phone]

## 2014-06-28 NOTE — ED Provider Notes (Signed)
Medical screening examination/treatment/procedure(s) were conducted as a shared visit with non-physician practitioner(s) and myself.  I personally evaluated the patient during the encounter.   EKG Interpretation None      Date: 06/28/2014  Rate: 145  Rhythm: atrial fibrillation  QRS Axis: normal  Intervals: normal  ST/T Wave abnormalities: nonspecific ST changes  Conduction Disutrbances:none  Narrative Interpretation:   Old EKG Reviewed: none available  Patient here from [facility] today and diagnosed with irregular heartbeat and found to be in atrial fibrillation. Denies any anginal type chest pain. Her EKG is consistent with atrial fibrillation with rapid ventricular rate response. Patient given Cardizem bolus 20 mg followed by 5 mg Cardizem drip. Heart rate slightly improved initially but then went back up again. Cardizem drip was increased to 10 mg per hour. Cardiology consult and patient to be admitted by triad hospitalist   CRITICAL CARE Performed by: Leota Jacobsen Total critical care time: 50 Critical care time was exclusive of separately billable procedures and treating other patients. Critical care was necessary to treat or prevent imminent or life-threatening deterioration. Critical care was time spent personally by me on the following activities: development of treatment plan with patient and/or surrogate as well as nursing, discussions with consultants, evaluation of patient's response to treatment, examination of patient, obtaining history from patient or surrogate, ordering and performing treatments and interventions, ordering and review of laboratory studies, ordering and review of radiographic studies, pulse oximetry and re-evaluation of patient's condition.    Leota Jacobsen, MD 06/28/14 [phone]

## 2014-06-28 NOTE — Progress Notes (Signed)
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    04/08/2012 Bone Marrow Biopsy BM biopsy showed 92% plasma cell, loss chromosome 8 and 13, addition of chromosome 8p, 14q and Myeloma FISH showed 13q and extra chromosome 14   04/12/2012 - 10/20/2012 Chemotherapy Velcade 3 weeks on 1 week off, Revlimid 15 mg PO days 1-21 days, dexamethasone 40 mg weekly.    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   01/30/2014 Procedure She has placement of Infuse-a-Port   02/01/2014 - 03/01/2014 Chemotherapy Due to progression of disease, Velcade was added back. Revlimid doses reduced to 5 mg, 21 days on 7 days off. Treatment was discontinued due to new palpable lymphadenopathy.   03/08/2014 Imaging Increased size of large right lateral chest wall soft tissue mass with destruction of the left lateral seventh rib. New lymphadenopathy in the right lateral chest wall, right axilla, and right cardiophrenic angle.     04/11/2014 Surgery She underwent excisional lymph node  biopsy.   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   04/19/2014 - 04/24/2014 Hospital Admission The patient was admitted to the hospital for cycle 1 of chemotherapy for malignant hypercalcemia.   05/01/2014 Pathology Results I reviewed her case at the hematology tumor board. 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   05/18/2014 -  Chemotherapy She started on clinical trial utilizing Elotuzumab, dexamethasone and Revlimid.    INTERVAL HISTORY: Please see below for problem oriented charting. The patient is seen urgently today because of profound weakness. According to her husband, she has been unwell for 5 days with cough. She saw her primary care provider 2 days ago and was diagnosed with bronchitis. She denies fevers or chills. Her cough is improving but her weakness is worsening. She complained of palpitation but no chest pain.  REVIEW OF SYSTEMS:   Constitutional: Denies fevers, chills or abnormal weight loss Eyes: Denies blurriness of vision Ears, nose, mouth, throat, and face: Denies mucositis or sore throat Gastrointestinal:  Denies nausea, heartburn or change in bowel habits Skin: Denies abnormal skin rashes Lymphatics: Denies new lymphadenopathy or easy bruising Neurological:Denies numbness, tingling  Behavioral/Psych: Mood is stable, no new changes  All other systems were reviewed with the patient and are negative.  I have reviewed the past medical history, past surgical history, social history and family history with the patient and they are unchanged from previous note.  ALLERGIES:  is allergic to simvastatin and zometa.  MEDICATIONS:  Current Outpatient Prescriptions  Medication  Sig Dispense Refill  . acyclovir (ZOVIRAX) 400 MG tablet Take 400 mg by mouth daily.      Marland Kitchen ALPRAZolam (XANAX) 1 MG tablet Take 1-2 tablets (1-2 mg total) by mouth at bedtime as needed for anxiety.  60  tablet  2  . aspirin EC 81 MG tablet Take 81 mg by mouth daily.      . Calcium Carbonate-Vitamin D (CALCIUM 500 + D PO) Take 1 tablet by mouth 2 (two) times daily.      . CRESTOR 10 MG tablet TAKE 1 TABLET AT BEDTIME.  30 tablet  0  . dexamethasone (DECADRON) 4 MG tablet Take 7 tablets (28 mg total) by mouth once a week.  28 tablet  1  . HYDROcodone-homatropine (HYCODAN) 5-1.5 MG/5ML syrup Take 5 mLs by mouth at bedtime as needed for cough.  180 mL  0  . lenalidomide (REVLIMID) 10 MG capsule Take 1 capsule (10 mg total) by mouth daily.  21 capsule  0  . levofloxacin (LEVAQUIN) 750 MG tablet Take 1 tablet (750 mg total) by mouth daily.  5 tablet  0  . losartan (COZAAR) 50 MG tablet Take 50 mg by mouth daily.      . Multiple Vitamin (MULTIVITAMIN WITH MINERALS) TABS Take 1 tablet by mouth daily.      Marland Kitchen oxyCODONE (ROXICODONE) 5 MG immediate release tablet Take 1 tablet (5 mg total) by mouth every 4 (four) hours as needed for severe pain.  90 tablet  0  . potassium chloride SA (K-DUR,KLOR-CON) 20 MEQ tablet Take 1 tablet (20 mEq total) by mouth 2 (two) times daily.  14 tablet  0  . tetrahydrozoline 0.05 % ophthalmic solution Place 1 drop into both eyes 2 (two) times daily as needed (redness/dry eyes).      . traZODone (DESYREL) 50 MG tablet Take 1 tablet (50 mg total) by mouth at bedtime as needed for sleep.  90 tablet  3  . Alum & Mag Hydroxide-Simeth (MAGIC MOUTHWASH W/LIDOCAINE) SOLN Take 5 mLs by mouth 3 (three) times daily.  240 mL  0  . polymixin-bacitracin (POLYSPORIN) 500-10000 UNIT/GM OINT ointment Apply 1 application topically 2 (two) times daily. Generic ok  28.35 g  PRN  . [DISCONTINUED] prochlorperazine (COMPAZINE) 10 MG tablet Take 1 tablet (10 mg total) by mouth every 6 (six) hours as needed (Nausea or vomiting).  30 tablet  1   No current facility-administered medications for this visit.    PHYSICAL EXAMINATION: ECOG PERFORMANCE STATUS: 2 - Symptomatic, <50% confined to bed  Filed  Vitals:   06/28/14 1420  BP: 119/76  Pulse: 129  Temp: 97.5 F (36.4 C)  Resp: 20   Filed Weights   06/28/14 1420  Weight: 143 lb (64.864 kg)    GENERAL:alert, no distress and comfortable SKIN: skin color, texture, turgor are normal, no rashes or significant lesions EYES: normal, Conjunctiva are pink and non-injected, sclera clear OROPHARYNX:no exudate, no erythema and lips, buccal mucosa, and tongue normal  NECK: supple, thyroid normal size, non-tender, without nodularity LYMPH:  no palpable lymphadenopathy in the cervical, axillary or inguinal LUNGS: Reduced breath sounds on the right lung base. Normal breathing effort.  HEART: Tachycardia with irregular heart rate. No leg edema. ABDOMEN:abdomen soft, non-tender and normal bowel sounds Musculoskeletal:no cyanosis of digits and no clubbing  NEURO: alert & oriented x 3 with fluent speech, no focal motor/sensory deficits  LABORATORY DATA:  I have reviewed the data as listed    Component Value Date/Time  NA 146* 06/28/2014 1352   NA 141 05/09/2014 1007   K 3.3* 06/28/2014 1352   K 2.9* 05/09/2014 1007   CL 98 05/09/2014 1007   CL 105 02/23/2013 1334   CO2 20* 06/28/2014 1352   CO2 29 05/09/2014 1007   GLUCOSE 99 06/28/2014 1352   GLUCOSE 148* 05/09/2014 1007   GLUCOSE 83 02/23/2013 1334   GLUCOSE 96 09/30/2006 1322   BUN 11.0 06/28/2014 1352   BUN 19 05/09/2014 1007   CREATININE 0.8 06/28/2014 1352   CREATININE 1.02 05/09/2014 1007   CREATININE 1.16* 07/10/2013 1001   CALCIUM 10.0 06/28/2014 1352   CALCIUM 9.4 05/09/2014 1007   CALCIUM 11.8* 04/04/2012 0420   PROT 5.8* 06/28/2014 1352   PROT 5.7* 05/09/2014 1007   ALBUMIN 3.0* 06/28/2014 1352   ALBUMIN 2.8* 05/09/2014 1007   AST 12 06/28/2014 1352   AST 33 05/09/2014 1007   ALT 11 06/28/2014 1352   ALT 62* 05/09/2014 1007   ALKPHOS 57 06/28/2014 1352   ALKPHOS 87 05/09/2014 1007   BILITOT 0.82 06/28/2014 1352   BILITOT 0.3 05/09/2014 1007   GFRNONAA 85* 04/23/2014 0428   GFRNONAA 46*  07/10/2013 1001   GFRAA >90 04/23/2014 0428   GFRAA 53* 07/10/2013 1001    No results found for this basename: SPEP, UPEP,  kappa and lambda light chains    Lab Results  Component Value Date   WBC 9.2 06/28/2014   NEUTROABS 6.1 06/28/2014   HGB 11.4* 06/28/2014   HCT 36.0 06/28/2014   MCV 98.2 06/28/2014   PLT 193 06/28/2014      Chemistry      Component Value Date/Time   NA 146* 06/28/2014 1352   NA 141 05/09/2014 1007   K 3.3* 06/28/2014 1352   K 2.9* 05/09/2014 1007   CL 98 05/09/2014 1007   CL 105 02/23/2013 1334   CO2 20* 06/28/2014 1352   CO2 29 05/09/2014 1007   BUN 11.0 06/28/2014 1352   BUN 19 05/09/2014 1007   CREATININE 0.8 06/28/2014 1352   CREATININE 1.02 05/09/2014 1007   CREATININE 1.16* 07/10/2013 1001      Component Value Date/Time   CALCIUM 10.0 06/28/2014 1352   CALCIUM 9.4 05/09/2014 1007   CALCIUM 11.8* 04/04/2012 0420   ALKPHOS 57 06/28/2014 1352   ALKPHOS 87 05/09/2014 1007   AST 12 06/28/2014 1352   AST 33 05/09/2014 1007   ALT 11 06/28/2014 1352   ALT 62* 05/09/2014 1007   BILITOT 0.82 06/28/2014 1352   BILITOT 0.3 05/09/2014 1007      ASSESSMENT & PLAN:  Multiple myeloma Her treatment will be placed on hold. I am directing her to the emergency department for workup and admission today.  Atrial fibrillation with RVR This is likely triggered by recent infection. Clinically, she has cardiovascular instability. I will draw blood cultures to exclude infection. She is treated now for bronchitis by her PCP. I would direct her to the emergency department for triage and admission.  Anemia in neoplastic disease This is likely anemia of chronic disease. The patient denies recent history of bleeding such as epistaxis, hematuria or hematochezia. She is asymptomatic from the anemia. We will observe for now.  She does not require transfusion now.    Hypokalemia This is due to poor oral intake. The patient will need replacement therapy after admission.   Orders Placed  This Encounter  Procedures  . Culture, blood (single)    Standing Status: Future  Number of Occurrences: 1     Standing Expiration Date: 08/02/2015  . Troponin I    Standing Status: Future     Number of Occurrences: 1     Standing Expiration Date: 08/02/2015  . CK total and CKMB    Standing Status: Future     Number of Occurrences: 1     Standing Expiration Date: 08/02/2015  . TSH    Standing Status: Future     Number of Occurrences: 1     Standing Expiration Date: 08/02/2015  . APTT    Standing Status: Future     Number of Occurrences: 1     Standing Expiration Date: 08/02/2015  . Protime-INR    Standing Status: Future     Number of Occurrences: 1     Standing Expiration Date: 08/02/2015   All questions were answered. The patient knows to call the clinic with any problems, questions or concerns. No barriers to learning was detected. I spent 30 minutes counseling the patient face to face. The total time spent in the appointment was 40 minutes and more than 50% was on counseling and review of test results     [facility], [place], MD 06/28/2014 3:38 PM

## 2014-06-28 NOTE — H&P (Signed)
Patient Demographics  [name], is a 78 y.o. female  MRN: [id number]   DOB - 29-Mar-1936  Admit Date - 06/28/2014  Outpatient Primary MD for the patient is METHENEY,CATHERINE, MD   With History of -  Past Medical History  Diagnosis Date  . Anxiety   . Hypertension   . Hyperlipidemia   . Depression   . Osteopenia   . Acute renal failure      due to hypercalcemia and suspected multiple myeloma resolved =with IVF's    . Lytic bone lesions on xray   . Acute subdural hematoma 03/2012     recent fall in hospital stay,resolved+  . Arthritis     spine  . Atrial fibrillation   . Radiation 05/09/12-05/18/12    20 gray in 8 Fx's right hip palliative  . Status post chemotherapy     velcade and revlimid  . Drug-induced osteonecrosis of jaw   . Multiple myeloma 03/2012  . Cancer     mul;tiple myeloma, bone lesions r 7th rib,   . Lymphadenopathy, axillary 03/08/2014  . Normocytic anemia     due to chemo and multiple myeloma,no active bleding  . Mucositis (ulcerative) due to antineoplastic therapy 05/02/2014  . Dysuria 05/02/2014  . Pleural effusion 05/11/2014  . Atrial fibrillation with RVR 06/28/2014      Past Surgical History  Procedure Laterality Date  . 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 Tonsillectomy      age 27  . Breast lumpectomy  1962    lt bx-neg  . Port a cath revision      put in 6/15  . Axillary lymph node biopsy Right 04/11/2014    Procedure: RIGHT AXILLARY LYMPH NODE BIOPSY;  Surgeon: Joyice Faster. Cornett, MD;  Location: [facility];  Service: General;  Laterality: Right;    in for   Chief Complaint  Patient presents with  . Atrial Fibrillation  . Oncology Pt   . Tachycardia     HPI  [name]  is a 78 y.o. female, with history of multiple myeloma, was at the follow up at oncology clinic, the patient was found to be in A. fib with RVR, so  she was sent to ED for further evaluation, patient was found to be in A. fib with heart rate in the 150s, given IV Cardizem bolus with no response, so she was started on Cardizem drip, and hospitalist requested to admit the patient for further treatment and evaluation, patient denies history of A. fib in the past, but it is recorded in her history to have paroxysmal A. Fib. She denies any chest pain, or hemoptysis, reports palpitation and shortness of breath and cough. The patient has been complaining of cough, running nose, over the last 3 days, as well reports her husband has been having same complaints as well, where they have not been feeling well, she reports feeling febrile, but she did not have any fever in ED.    Review of Systems    In addition to the HPI above,  Reports feeling a febrile. No Headache, No changes with Vision or hearing, No problems swallowing food or Liquids, No Chest pain, complaints of Cough or Shortness of Breath, No Abdominal pain, No Nausea or Vommitting, Bowel movements are regular, No Blood in stool or Urine, No dysuria, No new skin rashes or bruises, No new joints pains-aches,  No new  focal weakness, tingling, numbness in any extremity, planes of generalized weakness and fatigue No recent weight gain or loss, No polyuria, polydypsia or polyphagia, No significant Mental Stressors.  A full 10 point Review of Systems was done, except as stated above, all other Review of Systems were negative.   Social History History  Substance Use Topics  . Smoking status: Never Smoker   . Smokeless tobacco: Never Used  . Alcohol Use: No     Family History Family History  Problem Relation Age of Onset  . Heart disease Mother   . Hyperlipidemia Sister   . Hypertension Sister   . Diabetes Sister   . Diabetes Sister   . Hyperlipidemia Sister   . Hypertension Sister   . Sudden death Neg Hx   . Heart attack Neg Hx      Prior to Admission medications     Medication Sig Start Date End Date Taking? Authorizing Provider  acyclovir (ZOVIRAX) 400 MG tablet Take 400 mg by mouth daily.   Yes Historical Provider, MD  ALPRAZolam Duanne Moron) 1 MG tablet Take 1 mg by mouth at bedtime as needed for anxiety (anxiety).   Yes Historical Provider, MD  aspirin EC 81 MG tablet Take 81 mg by mouth daily.   Yes Historical Provider, MD  Calcium Carbonate-Vitamin D (CALCIUM 500 + D PO) Take 1 tablet by mouth 2 (two) times daily.   Yes Historical Provider, MD  dexamethasone (DECADRON) 4 MG tablet Take 12 mg by mouth once a week.   Yes Historical Provider, MD  HYDROcodone-homatropine (HYCODAN) 5-1.5 MG/5ML syrup Take 5 mLs by mouth every 6 (six) hours as needed for cough (trouble sleeping).   Yes Historical Provider, MD  lenalidomide (REVLIMID) 10 MG capsule Take 1 capsule (10 mg total) by mouth daily. 06/11/14  Yes Heath Lark, MD  levofloxacin (LEVAQUIN) 750 MG tablet Take 1 tablet (750 mg total) by mouth daily. 06/26/14  Yes Hali Marry, MD  losartan (COZAAR) 50 MG tablet Take 50 mg by mouth daily.   Yes Historical Provider, MD  Multiple Vitamin (MULTIVITAMIN WITH MINERALS) TABS Take 1 tablet by mouth daily.   Yes Historical Provider, MD  polymixin-bacitracin (POLYSPORIN) 500-10000 UNIT/GM OINT ointment Apply 1 application topically 2 (two) times daily. Generic ok 06/26/14  Yes Hali Marry, MD  potassium chloride SA (K-DUR,KLOR-CON) 20 MEQ tablet Take 1 tablet (20 mEq total) by mouth 2 (two) times daily. 05/09/14  Yes Heath Lark, MD  rosuvastatin (CRESTOR) 10 MG tablet Take 10 mg by mouth at bedtime.   Yes Historical Provider, MD  tetrahydrozoline 0.05 % ophthalmic solution Place 1 drop into both eyes 2 (two) times daily as needed (redness/dry eyes).   Yes Historical Provider, MD  oxyCODONE (ROXICODONE) 5 MG immediate release tablet Take 1 tablet (5 mg total) by mouth every 4 (four) hours as needed for severe pain. 06/15/14   Heath Lark, MD  traZODone (DESYREL)  50 MG tablet Take 1 tablet (50 mg total) by mouth at bedtime as needed for sleep. 02/23/14   Heath Lark, MD    Allergies  Allergen Reactions  . Simvastatin Other (See Comments)    Unsure, Joint aches listed previously   . Zometa [Zoledronic Acid] Other (See Comments)    Osteonecrosis of the jaw    Physical Exam  Vitals  Blood pressure 123/67, pulse 97, temperature 97.8 F (36.6 C), resp. rate 31, height $RemoveBe'5\' 6"'NLgMifdFI$  (1.676 m), weight 64.864 kg (143 lb), SpO2 96.00%.   1. General frail  elderly female lying in bed in NAD,  has alopecia secondary to chemotherapy.  2. Normal affect and insight, Not Suicidal or Homicidal, Awake Alert, Oriented X 3.  3. No F.N deficits, ALL C.Nerves Intact, Strength 5/5 all 4 extremities, Sensation intact all 4 extremities, Plantars down going.  4. Ears and Eyes appear Normal, Conjunctivae clear, PERRLA. Moist Oral Mucosa.  5. Supple Neck, No JVD, No cervical lymphadenopathy appriciated, No Carotid Bruits.  6. Symmetrical Chest wall movement, Good air movement bilaterally, right lung basilar rails.  7. irregular irregular, No Gallops, Rubs or Murmurs, No Parasternal Heave.  8. Positive Bowel Sounds, Abdomen Soft, No tenderness, No organomegaly appriciated,No rebound -guarding or rigidity.  9.  No Cyanosis, Normal Skin Turgor, No Skin Rash or Bruise.  10. Good muscle tone,  joints appear normal , no effusions, Normal ROM.     Data Review  CBC  Recent Labs Lab 06/22/14 0806 06/28/14 1352  WBC 7.2 9.2  HGB 10.3* 11.4*  HCT 32.0* 36.0  PLT 189 193  MCV 97.7 98.2  MCH 31.4 31.2  MCHC 32.2 31.7  RDW 18.0* 18.0*  LYMPHSABS 0.4* 1.1  MONOABS 0.5 1.1*  EOSABS 0.7* 0.8*  BASOSABS 0.0 0.1   ------------------------------------------------------------------------------------------------------------------  Chemistries   Recent Labs Lab 06/22/14 0805 06/28/14 1352  NA 144 146*  K 3.8 3.3*  CO2 24 20*  GLUCOSE 128 99  BUN 16.0 11.0    CREATININE 0.9 0.8  CALCIUM 9.6 10.0  AST 13 12  ALT 6 11  ALKPHOS 56 57  BILITOT 0.73 0.82   ------------------------------------------------------------------------------------------------------------------ estimated creatinine clearance is 56 ml/min (by C-G formula based on Cr of 0.8). ------------------------------------------------------------------------------------------------------------------ No results found for this basename: TSH, T4TOTAL, FREET3, T3FREE, THYROIDAB,  in the last 72 hours   Coagulation profile No results found for this basename: INR, PROTIME,  in the last 168 hours ------------------------------------------------------------------------------------------------------------------- No results found for this basename: DDIMER,  in the last 72 hours -------------------------------------------------------------------------------------------------------------------  Cardiac Enzymes No results found for this basename: CK, CKMB, TROPONINI, MYOGLOBIN,  in the last 168 hours ------------------------------------------------------------------------------------------------------------------ No components found with this basename: POCBNP,    ---------------------------------------------------------------------------------------------------------------  Urinalysis    Component Value Date/Time   COLORURINE YELLOW 11/27/2012 1406   APPEARANCEUR CLEAR 11/27/2012 1406   LABSPEC 1.029 11/27/2012 1406   LABSPEC 1.030 05/27/2012 1429   PHURINE 5.5 11/27/2012 1406   GLUCOSEU NEGATIVE 11/27/2012 1406   HGBUR NEGATIVE 11/27/2012 1406   HGBUR negative 08/23/2009 [place] 11/27/2012 1406   BILIRUBINUR negative 08/14/2011 1331   KETONESUR 15* 11/27/2012 1406   PROTEINUR 30* 11/27/2012 1406   PROTEINUR negative 08/14/2011 1331   UROBILINOGEN 0.2 11/27/2012 1406   UROBILINOGEN 0.2 08/14/2011 1331   NITRITE NEGATIVE 11/27/2012 1406   NITRITE negative 08/14/2011 1331    LEUKOCYTESUR SMALL* 11/27/2012 1406    ----------------------------------------------------------------------------------------------------------------  Imaging results:   Dg Chest Portable 1 View  06/28/2014   CLINICAL DATA:  Cough and shortness of breath for 1 week, history of atrial fibrillation and multiple myeloma  EXAM: PORTABLE CHEST - 1 VIEW  COMPARISON:  05/17/2014 ,04/19/2014  FINDINGS: Cardiac shadow is mildly prominent. An enlarging right-sided pleural effusion is seen. Right-sided chest port is again noted and stable. The left lung is clear. Old rib fractures are noted on the left. There is irregularity of the seventh rib on the right similar to that seen on prior CT.  IMPRESSION: Enlarging right-sided pleural effusion.  Chronic changes.   Electronically Signed   By:  Inez Catalina M.D.   On: 06/28/2014 16:47    My personal review of EKG: Rhythm active, Rate  151/min,, no Acute ST changes    Assessment & Plan  Principal Problem:   Atrial fibrillation with RVR Active Problems:   Essential hypertension   Multiple myeloma   Hyperlipidemia   URI, acute    . A. fib with RVR:  currently heart rate much improved on Cardizem drip, so patient will be admitted to step down she'll be continued on Cardizem drip, ED physician consulted cardiology who will evaluate the patient, will leave it up to them to decide on anticoagulation, at one point patient can be started by mouth Cardizem or beta blocker, so her Cardizem drip can be stopped, had recent echo with normal ejection fraction, no diastolic dysfunction, so no indication to repeat echo at this point unless it seemed necessary by cardiology, will continue to cycle cardiac enzymes.   Essential hypertension: Blood pressure is on the lower side, so continue to hold her medication.  History of multiple myeloma: Continue patient on Decadron,Revlimid, and acyclovir for prophylaxis.  Hyperlipidemia Continue with statin  Acute  URI Start patient on erythromycin.  Right pleural effusion: Patient recent echo showing normal ejection fraction with node dysfunction, unlikely cardiac, at this point patient is a febrile, if she becomes symptomatic, or febrile, will require thoracentesis ,we'll hold on that until she is more stable.  DVT Prophylaxis Heparin - SCDs  AM Labs Ordered, also please review Full Orders  Family Communication: Admission, patients condition and plan of care including tests being ordered have been discussed with the patient and husband who indicate understanding and agree with the plan and Code Status.  Code Status DO NOT RESUSCITATE, she has a living will, husband is her health care power of attorney  Likely DC to  home when stable.  Condition GUARDED    Time spent in minutes : 55 minutes    Tionna Gigante M.D on 06/28/2014 at 6:03 PM  Between 7am to 7pm - Pager - [phone]  After 7pm go to [email or web address] - password [email or web address]  And look for the night coverage person covering me after hours  [facility] Office  [phone]   **Disclaimer: This note may have been dictated with voice recognition software. Similar sounding words can inadvertently be transcribed and this note may contain transcription errors which may not have been corrected upon publication of note.**

## 2014-06-28 NOTE — Telephone Encounter (Signed)
gv adn printed appt schedadded appt per staff....per staff pt aware and avs for pt for NOV and Dec...gv pt barium

## 2014-06-28 NOTE — ED Notes (Signed)
Cardiology MD at bedside.

## 2014-06-28 NOTE — Patient Instructions (Signed)

## 2014-06-28 NOTE — Telephone Encounter (Signed)
Husband called to report pt just not feeling well this week.  She saw her PCP for cold like symptoms a few days ago.  No fever.  Decreased appetite and generalized weakness.  He says pt does not want to continue in the trial as she is "not feeling any better."   Notified Dr. Alvy Bimler and she arranged to see pt this afternoon w/ lab.   Called back and s/w pt and instructed her to come at 2:00 pm today for lab and to see Dr. Alvy Bimler at 2:30 pm.  She verbalized understanding.

## 2014-06-28 NOTE — Assessment & Plan Note (Signed)
This is due to poor oral intake. The patient will need replacement therapy after admission.

## 2014-06-29 ENCOUNTER — Other Ambulatory Visit: Payer: Medicare Other

## 2014-06-29 ENCOUNTER — Ambulatory Visit: Payer: Medicare Other

## 2014-06-29 DIAGNOSIS — D630 Anemia in neoplastic disease: Secondary | ICD-10-CM

## 2014-06-29 DIAGNOSIS — I059 Rheumatic mitral valve disease, unspecified: Secondary | ICD-10-CM

## 2014-06-29 DIAGNOSIS — C9000 Multiple myeloma not having achieved remission: Secondary | ICD-10-CM

## 2014-06-29 DIAGNOSIS — E876 Hypokalemia: Secondary | ICD-10-CM

## 2014-06-29 LAB — CBC
HEMATOCRIT: 30.4 % — AB (ref 36.0–46.0)
Hemoglobin: 9.9 g/dL — ABNORMAL LOW (ref 12.0–15.0)
MCH: 31.9 pg (ref 26.0–34.0)
MCHC: 32.6 g/dL (ref 30.0–36.0)
MCV: 98.1 fL (ref 78.0–100.0)
PLATELETS: 142 10*3/uL — AB (ref 150–400)
RBC: 3.1 MIL/uL — ABNORMAL LOW (ref 3.87–5.11)
RDW: 17.8 % — AB (ref 11.5–15.5)
WBC: 9.4 10*3/uL (ref 4.0–10.5)

## 2014-06-29 LAB — BASIC METABOLIC PANEL
ANION GAP: 21 — AB (ref 5–15)
Anion gap: 23 — ABNORMAL HIGH (ref 5–15)
BUN: 11 mg/dL (ref 6–23)
BUN: 11 mg/dL (ref 6–23)
CALCIUM: 9.2 mg/dL (ref 8.4–10.5)
CHLORIDE: 98 meq/L (ref 96–112)
CHLORIDE: 101 meq/L (ref 96–112)
CO2: 20 mEq/L (ref 19–32)
CO2: 20 mEq/L (ref 19–32)
CREATININE: 0.76 mg/dL (ref 0.50–1.10)
CREATININE: 0.74 mg/dL (ref 0.50–1.10)
Calcium: 8.9 mg/dL (ref 8.4–10.5)
GFR calc Af Amer: >90 mL/min (ref >90)
GFR calc Af Amer: >90 mL/min (ref >90)
GFR calc non Af Amer: 81 mL/min — ABNORMAL LOW (ref >90)
GFR, EST NON AFRICAN AMERICAN: 80 mL/min — AB (ref >90)
GLUCOSE: 95 mg/dL (ref 70–99)
Glucose, Bld: 96 mg/dL (ref 70–99)
POTASSIUM: 3.1 meq/L — AB (ref 3.7–5.3)
Potassium: 3.5 mEq/L — ABNORMAL LOW (ref 3.7–5.3)
Sodium: 139 mEq/L (ref 137–147)
Sodium: 144 mEq/L (ref 137–147)

## 2014-06-29 LAB — TROPONIN I
Troponin I: 0.08 ng/mL — ABNORMAL HIGH (ref <0.06)
Troponin I: <0.3 ng/mL (ref <0.30)
Troponin I: <0.3 ng/mL (ref <0.30)

## 2014-06-29 LAB — APTT: APTT: 39 s — AB (ref 24–37)

## 2014-06-29 LAB — TSH CHCC: TSH: 0.67 m[IU]/L (ref 0.308–3.960)

## 2014-06-29 LAB — MRSA PCR SCREENING: MRSA by PCR: NEGATIVE

## 2014-06-29 LAB — STREP PNEUMONIAE URINARY ANTIGEN: Strep Pneumo Urinary Antigen: NEGATIVE

## 2014-06-29 LAB — CK TOTAL AND CKMB (NOT AT ARMC)
CK TOTAL: 18 U/L (ref 7–177)
CK, MB: 2.4 ng/mL (ref 0.0–5.0)

## 2014-06-29 LAB — PROTHROMBIN TIME
INR: 1.2 (ref <1.50)
Prothrombin Time: 15.2 seconds (ref 11.6–15.2)

## 2014-06-29 MED ORDER — ENSURE PUDDING PO PUDG
1.0000 | Freq: Three times a day (TID) | ORAL | Status: DC
  Administered 2014-06-29 – 2014-07-05 (×10): 1 via ORAL
  Filled 2014-06-29 (×22): qty 1

## 2014-06-29 MED ORDER — BOOST PLUS PO LIQD
237.0000 mL | ORAL | Status: DC | PRN
  Administered 2014-06-29: 237 mL via ORAL
  Filled 2014-06-29: qty 237

## 2014-06-29 MED ORDER — POTASSIUM CHLORIDE CRYS ER 20 MEQ PO TBCR
40.0000 meq | EXTENDED_RELEASE_TABLET | Freq: Once | ORAL | Status: AC
  Administered 2014-06-29: 40 meq via ORAL
  Filled 2014-06-29: qty 2

## 2014-06-29 MED ORDER — MAGNESIUM HYDROXIDE 400 MG/5ML PO SUSP
30.0000 mL | Freq: Two times a day (BID) | ORAL | Status: DC
  Administered 2014-06-29 – 2014-07-04 (×3): 30 mL via ORAL
  Filled 2014-06-29 (×9): qty 30

## 2014-06-29 MED ORDER — SODIUM CHLORIDE 0.9 % IV SOLN
INTRAVENOUS | Status: DC
  Administered 2014-06-29 – 2014-07-01 (×2): via INTRAVENOUS

## 2014-06-29 NOTE — Progress Notes (Signed)
CARE MANAGEMENT NOTE 06/29/2014  Patient:  CARRISA, KELLER   Account Number:  [id number]  Date Initiated:  06/29/2014  Documentation initiated by:  DAVIS,RHONDA  Subjective/Objective Assessment:   a.fib with rvr     Action/Plan:   home when stable/lives with spouse in a private residence   Anticipated DC Date:  07/02/2014   Anticipated DC Plan:  HOME/SELF CARE  In-house referral  NA      DC Planning Services  CM consult      PAC Choice  NA   Choice offered to / List presented to:  NA           [facility] agency  NA   Status of service:  In process, will continue to follow Medicare Important Message given?   (If response is "NO", the following Medicare IM given date fields will be blank) Date Medicare IM given:   Medicare IM given by:   Date Additional Medicare IM given:   Additional Medicare IM given by:    Discharge Disposition:    Per UR Regulation:  Reviewed for med. necessity/level of care/duration of stay  If discussed at [place] of Stay Meetings, dates discussed:    Comments:  [id number]/Rhonda Eldridge Dace, BSN, [place] [phone] Chart Reviewed. Discharge needs at time of review: None present will follow for needs.

## 2014-06-29 NOTE — Progress Notes (Signed)
[name]   DOB:12-09-1935   ZO#:[id number]   JWJ#:[id number] I have seen the patient, examined her and edited the notes as follows  Subjective: Patient seen and examined. Afebrile. She is still feeling weak. Monitor reports atrial fibrillation, better controlled with current antiarrhythmics with improvement of rate control Occasional episodes of dizziness.Denies chest pain or palpitations. She reports improving shortness of breath, non productive cough. Intermittent nausea, no vomiting. Appetite decreased. No constipation or urinary complaints. No confusion is reported.has not ambulated due to symptoms.  Scheduled Meds: . acyclovir  400 mg Oral Daily  . aspirin EC  81 mg Oral Daily  . atorvastatin  20 mg Oral q1800  . azithromycin  500 mg Intravenous Q24H  . dexamethasone  12 mg Oral Weekly  . guaiFENesin  600 mg Oral BID  . heparin  5,000 Units Subcutaneous 3 times per day  . lenalidomide  10 mg Oral Daily  . multivitamin with minerals  1 tablet Oral Daily  . senna  1 tablet Oral BID  . sodium chloride  3 mL Intravenous Q12H   Continuous Infusions: . sodium chloride 10 mL/hr at 06/29/14 0142  . diltiazem (CARDIZEM) infusion 5 mg/hr (06/29/14 0320)  . diltiazem (CARDIZEM) infusion 5 mg/hr (06/29/14 0600)   PRN Meds:acetaminophen, acetaminophen, ALPRAZolam, alum & mag hydroxide-simeth, bisacodyl, HYDROcodone-homatropine, ondansetron (ZOFRAN) IV, ondansetron, oxyCODONE   Objective:  Filed Vitals:   06/29/14 0600  BP: 106/53  Pulse: 74  Temp:   Resp: 14      Intake/Output Summary (Last 24 hours) at 06/29/14 0734 Last data filed at 06/29/14 0600  Gross per 24 hour  Intake    355 ml  Output      0 ml  Net    355 ml    ECOG PERFORMANCE STATUS: 2-3  GENERAL:alert, no distress and ill appearing. SKIN: skin color, texture, turgor are normal, no rashes or significant lesions. Alopecia noted. EYES: normal, Conjunctiva are pink and non-injected, sclera clear  OROPHARYNX:no  exudate, no erythema and lips, buccal mucosa, and tongue normal  NECK: supple, thyroid normal size, non-tender, without nodularity  LYMPH: no palpable lymphadenopathy in the cervical, axillary or inguinal  LUNGS: Reduced breath sounds on the right lung base. Normal breathing effort.  HEART: Tachycardia with irregular heart rate. No leg edema.  ABDOMEN: abdomen soft, non-tender and normal bowel sounds  Musculoskeletal:no cyanosis of digits and no clubbing  NEURO: alert & oriented x 3 with fluent speech, no focal motor/sensory deficits    CBG (last 3)  No results found for this basename: GLUCAP,  in the last 72 hours   Labs:   Recent Labs Lab 06/22/14 0806 06/28/14 1352 06/28/14 2100 06/29/14 0230  WBC 7.2 9.2 8.7 9.4  HGB 10.3* 11.4* 10.1* 9.9*  HCT 32.0* 36.0 31.2* 30.4*  PLT 189 193 155 142*  MCV 97.7 98.2 98.1 98.1  MCH 31.4 31.2 31.8 31.9  MCHC 32.2 31.7 32.4 32.6  RDW 18.0* 18.0* 17.5* 17.8*  LYMPHSABS 0.4* 1.1  --   --   MONOABS 0.5 1.1*  --   --   EOSABS 0.7* 0.8*  --   --   BASOSABS 0.0 0.1  --   --      Chemistries:    Recent Labs Lab 06/22/14 0805 06/28/14 1352 06/28/14 2100 06/29/14 0230  NA 144 146*  --  139  K 3.8 3.3*  --  3.1*  CL  --   --   --  98  CO2 24 20*  --  20  GLUCOSE 128 99  --  96  BUN 16.0 11.0  --  11  CREATININE 0.9 0.8 0.75 0.76  CALCIUM 9.6 10.0  --  9.2  AST 13 12  --   --   ALT 6 11  --   --   ALKPHOS 56 57  --   --   BILITOT 0.73 0.82  --   --     GFR Estimated Creatinine Clearance: 56 ml/min (by C-G formula based on Cr of 0.76).  Liver Function Tests:  Recent Labs Lab 06/22/14 0805 06/28/14 1352  AST 13 12  ALT 6 11  ALKPHOS 56 57  BILITOT 0.73 0.82  PROT 5.7* 5.8*  ALBUMIN 2.7* 3.0*   Coagulation profile  Recent Labs Lab 06/28/14 1521  INR 1.20    Cardiac Enzymes:  Recent Labs Lab 06/28/14 1521 06/28/14 2100 06/29/14 0230  CKTOTAL 18  --   --   CKMB 2.4  --   --   TROPONINI 0.08* <0.30  <0.30    Recent Results (from the past 240 hour(s))  MRSA PCR SCREENING     Status: None   Collection Time    06/29/14 12:08 AM      Result Value Ref Range Status   MRSA by PCR NEGATIVE  NEGATIVE Final   Comment:            The GeneXpert MRSA Assay (FDA     approved for NASAL specimens     only), is one component of a     comprehensive MRSA colonization     surveillance program. It is not     intended to diagnose MRSA     infection nor to guide or     monitor treatment for     MRSA infections.       Imaging Studies:  Dg Chest Portable 1 View  06/28/2014   CLINICAL DATA:  Cough and shortness of breath for 1 week, history of atrial fibrillation and multiple myeloma  EXAM: PORTABLE CHEST - 1 VIEW  COMPARISON:  05/17/2014 ,04/19/2014  FINDINGS: Cardiac shadow is mildly prominent. An enlarging right-sided pleural effusion is seen. Right-sided chest port is again noted and stable. The left lung is clear. Old rib fractures are noted on the left. There is irregularity of the seventh rib on the right similar to that seen on prior CT.  IMPRESSION: Enlarging right-sided pleural effusion.  Chronic changes.   Electronically Signed   By: Inez Catalina M.D.   On: 06/28/2014 16:47    Assessment/Plan: 78 y.o.  Atrial fibrillation with RVR  This is likely triggered by recent infection. Clinically, she has cardiovascular instability.  A recent echo had shown normal ejection fraction, no diastolic dysfunction, thus has not been repeated at this time. Cardiac enzymes are negative today. Placed on Cardizem drip on admission.  Blood cultures pending. MRSA negative. She was being treated for bronchitis by her PCP.   Multiple myeloma  Her treatment was placed on hold due to cardiac symptoms.   Anemia in neoplastic disease  This is likely anemia of chronic disease. The patient denies recent history of bleeding such as epistaxis, hematuria or hematochezia. She is asymptomatic from the anemia. We will  observe for now. She does not require transfusion now.   Hypokalemia  This is due to poor oral intake. The patient is on replacement therapy by primary team.  Acute upper respiratory infection Right Pleural Effusion Chest x ray on admission showed enlarging right sided  pleural effusion. Continue to monitor per primary team. If continues to increase, she may need therapeutic thoracentesis. . Cultures pending She is on azithromycin day 2 and guaifenesin  DVT prophylaxis On Heparin and mechanical devices  DNR Other medical issues as per admitting team   I will return next week to check on the patient. Please call on-call service if assistance is needed.  Rondel Jumbo, PA-C 06/29/2014  7:34 AM Kemesha Mosey, MD 06/29/2014

## 2014-06-29 NOTE — Progress Notes (Addendum)
Patient ID: [name], female   DOB: [birth year], 78 y.o.   MRN: [id number]  [facility] PROGRESS NOTE  [name] VZD:[id number] DOB: [birth year] DOA: 06/28/2014 PCP: [name], MD  Brief narrative: Patient is 78 year old female with multiple myeloma, presented to [facility] long emergency department with main concern of generalized weakness, palpitations, exertional shortness of breath, productive cough of yellow sputum, subjective fevers and chills, symptoms progressively worsening over the past 2 days. In emergency department she was found to be in atrial fibrillation with heart rate and 150s. She was started on IV Cardizem bolus with no response and has subsequently required Cardizem drip. Hospitalist asked to admit for further evaluation. Patient denies chest pain, no abdominal or urinary concerns.  Assessment and Plan:    Principal Problem:   Atrial fibrillation with RVR - Patient admitted to step down unit due to requirement of Cardizem drip - Heart rate in 80s to 90s this morning, patient clinically stable and reports feeling better - Plan to taper off Cardizem drip, changed to by mouth - Followup on cardiology recommendations and transfer to telemetry if okay with cardiology Active Problems:   Acute respiratory failure - Appears to be multifactorial and secondary to acute onset of atrial fibrillation with RVR, right side pleural effusion, questionable pneumonia based on patient's presenting symptoms - Atrial fibrillation treated with Cardizem drip, management as noted above - Continue Zithromax day # 2, followup sputum analysis, urine Legionella and strep pneumo - Repeat x-ray in the morning, he is pleural effusion worse on the right, will consider IR consult for therapeutic thoracentesis   Essential hypertension - Reasonable inpatient control   Multiple myeloma - Appreciate Dr.Gorsuch assistance   Hyperlipidemia - Stable, continue statin   Severe protein  calorie malnutrition - Secondary to acute on chronic illnesses outlined above - Advance diet as patient able to tolerate and add nutritional supplements   Acute on chronic blood loss anemia - Slight drop in hemoglobin since admission - No signs of active bleeding, repeat CBC in the morning   Hypokalemia - Supplemented repeat BMP in the morning  DVT prophylaxis  Heparin SQ while pt is in hospital  Code Status: DNR Family Communication: Pt and her husband at bedside Disposition Plan: Transfer to telemetry in the afternoon if patient clinically stable  IV Access:   Peripheral IV Procedures and diagnostic studies:    Dg Chest Portable 1 View  06/28/2014  Enlarging right-sided pleural effusion.  Chronic changes.   Medical Consultants:   Cardiology  Other Consultants:   None   Anti-Infectives:   Zithromax 10/01 -->  Faye Ramsay, MD  [facility] Pager [phone]  If 7PM-7AM, please contact night-coverage [email or web address] Password [email or web address] 06/29/2014, 7:57 AM   LOS: 1 day   HPI/Subjective: No events overnight.   Objective: Filed Vitals:   06/29/14 0300 06/29/14 0357 06/29/14 0400 06/29/14 0600  BP:   110/58 106/53  Pulse:   95 74  Temp:  97.8 F (36.6 C)    TempSrc:  Oral    Resp:   27 14  Height:      Weight:  66.2 kg (145 lb 15.1 oz)    SpO2: 91%  97% 96%    Intake/Output Summary (Last 24 hours) at 06/29/14 0757 Last data filed at 06/29/14 0600  Gross per 24 hour  Intake    355 ml  Output      0 ml  Net    355 ml    Exam:   General:  Pt  is alert, follows commands appropriately, not in acute distress  Cardiovascular: Irregular rate and rhythm, SEM 3/6, no rubs, no gallops  Respiratory: Diminished breath sounds at bases, crackles right side more than left sides   Abdomen: Soft, non tender, non distended, bowel sounds present, no guarding   Data Reviewed: Basic Metabolic Panel:  Recent Labs Lab 06/22/14 0805 06/28/14 1352 06/28/14 2100 06/29/14 0230   NA 144 146*  --  139  K 3.8 3.3*  --  3.1*  CL  --   --   --  98  CO2 24 20*  --  20  GLUCOSE 128 99  --  96  BUN 16.0 11.0  --  11  CREATININE 0.9 0.8 0.75 0.76  CALCIUM 9.6 10.0  --  9.2   Liver Function Tests:  Recent Labs Lab 06/22/14 0805 06/28/14 1352  AST 13 12  ALT 6 11  ALKPHOS 56 57  BILITOT 0.73 0.82  PROT 5.7* 5.8*  ALBUMIN 2.7* 3.0*   CBC:  Recent Labs Lab 06/22/14 0806 06/28/14 1352 06/28/14 2100 06/29/14 0230  WBC 7.2 9.2 8.7 9.4  NEUTROABS 5.6 6.1  --   --   HGB 10.3* 11.4* 10.1* 9.9*  HCT 32.0* 36.0 31.2* 30.4*  MCV 97.7 98.2 98.1 98.1  PLT 189 193 155 142*   Cardiac Enzymes:  Recent Labs Lab 06/28/14 1521 06/28/14 2100 06/29/14 0230  CKTOTAL 18  --   --   CKMB 2.4  --   --   TROPONINI 0.08* <0.30 <0.30   Recent Results (from the past 240 hour(s))  MRSA PCR SCREENING     Status: None   Collection Time    06/29/14 12:08 AM      Result Value Ref Range Status   MRSA by PCR NEGATIVE  NEGATIVE Final   Comment:            The GeneXpert MRSA Assay (FDA     approved for NASAL specimens     only), is one component of a     comprehensive MRSA colonization     surveillance program. It is not     intended to diagnose MRSA     infection nor to guide or     monitor treatment for     MRSA infections.     Scheduled Meds: . acyclovir  400 mg Oral Daily  . aspirin EC  81 mg Oral Daily  . atorvastatin  20 mg Oral q1800  . azithromycin  500 mg Intravenous Q24H  . dexamethasone  12 mg Oral Weekly  . guaiFENesin  600 mg Oral BID  . heparin  5,000 Units Subcutaneous 3 times per day  . lenalidomide  10 mg Oral Daily   Continuous Infusions: . sodium chloride 10 mL/hr at 06/29/14 0142  . diltiazem (CARDIZEM) infusion 5 mg/hr (06/29/14 0320)  . diltiazem (CARDIZEM) infusion 5 mg/hr (06/29/14 0600)

## 2014-06-29 NOTE — Progress Notes (Signed)
Echocardiogram 2D Echocardiogram has been performed.  [name] 06/29/2014, 12:21 PM

## 2014-06-29 NOTE — Progress Notes (Signed)
INITIAL NUTRITION ASSESSMENT  DOCUMENTATION CODES Per approved criteria  -Not Applicable   INTERVENTION: - Boost Plus PRN (pt wants them ordered that way to get if she doesn't eat all of her meals - Ordered snack for pt - RD to continue to monitor   NUTRITION DIAGNOSIS: Increased nutrient needs related to multiple myeloma as evidenced by MD notes.   Goal: Pt to consume >90% of meals/supplements  Monitor:  Weights, labs, intake  Reason for Assessment: Consult for assessment   78 y.o. female  Admitting Dx: Atrial fibrillation with RVR  ASSESSMENT: Pt with hx of multiple myeloma, admitted with Atrial fibrillation with RVR.   - Met with pt who reports she eats on/off at home when she's hungry and also drinks 2-3 Boost/day as she desires them - States her weight has fluctuated up and down 10 pounds recently - Reports she ate a good lunch - Denies any changes in taste or mouth sores - Performed nutrition focused physical exam which was WNL   Height: Ht Readings from Last 1 Encounters:  06/28/14 _0  (1.676 m)    Weight: Wt Readings from Last 1 Encounters:  06/29/14 145 lb 15.1 oz (66.2 kg)    Ideal Body Weight: 130 lbs   % Ideal Body Weight: 111%  Wt Readings from Last 10 Encounters:  06/29/14 145 lb 15.1 oz (66.2 kg)  06/28/14 143 lb (64.864 kg)  06/26/14 143 lb (64.864 kg)  06/15/14 153 lb 1.6 oz (69.446 kg)  05/25/14 145 lb 14.4 oz (66.18 kg)  05/18/14 144 lb (65.318 kg)  05/09/14 148 lb 6.4 oz (67.314 kg)  05/02/14 146 lb 8 oz (66.452 kg)  04/30/14 147 lb 9.6 oz (66.951 kg)  04/26/14 154 lb (69.854 kg)    Usual Body Weight: 145 lbs per pt  % Usual Body Weight: 100%  BMI:  Body mass index is 23.57 kg/(m^2).  Estimated Nutritional Needs: Kcal: 1650-1850 Protein: 80-110g Fluid: 1.6-1.8L/day   Skin: Stage 2 sacral pressure ulcer   Diet Order:  Heart healthy/CHO medium   EDUCATION NEEDS: -No education needs identified at this  time   Intake/Output Summary (Last 24 hours) at 06/29/14 1247 Last data filed at 06/29/14 1058  Gross per 24 hour  Intake    358 ml  Output      0 ml  Net    358 ml    Last BM: 10/1  Labs:   Recent Labs Lab 06/28/14 1352 06/28/14 2100 06/29/14 0230 06/29/14 0845  NA 146*  --  139 144  K 3.3*  --  3.1* 3.5*  CL  --   --  98 101  CO2 20*  --  20 20  BUN 11.0  --  11 11  CREATININE 0.8 0.75 0.76 0.74  CALCIUM 10.0  --  9.2 8.9  GLUCOSE 99  --  96 95    CBG (last 3)  No results found for this basename: GLUCAP,  in the last 72 hours  Scheduled Meds: . acyclovir  400 mg Oral Daily  . aspirin EC  81 mg Oral Daily  . atorvastatin  20 mg Oral q1800  . azithromycin  500 mg Intravenous Q24H  . feeding supplement (ENSURE)  1 Container Oral TID BM  . guaiFENesin  600 mg Oral BID  . heparin  5,000 Units Subcutaneous 3 times per day  . magnesium hydroxide  30 mL Oral BID  . multivitamin with minerals  1 tablet Oral Daily  . senna  1  tablet Oral BID  . sodium chloride  3 mL Intravenous Q12H    Continuous Infusions: . sodium chloride 10 mL/hr at 06/29/14 0142  . diltiazem (CARDIZEM) infusion 5 mg/hr (06/29/14 0901)    Past Medical History  Diagnosis Date  . Anxiety   . Hypertension   . Hyperlipidemia   . Depression   . Osteopenia   . Acute renal failure      due to hypercalcemia and suspected multiple myeloma resolved =with IVF's    . Lytic bone lesions on xray   . Acute subdural hematoma 03/2012     recent fall in hospital stay,resolved+  . Arthritis     spine  . Atrial fibrillation   . Radiation 05/09/12-05/18/12    20 gray in 8 Fx's right hip palliative  . Status post chemotherapy     velcade and revlimid  . Drug-induced osteonecrosis of jaw   . Multiple myeloma 03/2012  . Cancer     mul;tiple myeloma, bone lesions r 7th rib,   . Lymphadenopathy, axillary 03/08/2014  . Normocytic anemia     due to chemo and multiple myeloma,no active bleding  . Mucositis  (ulcerative) due to antineoplastic therapy 05/02/2014  . Dysuria 05/02/2014  . Pleural effusion 05/11/2014  . Atrial fibrillation with RVR 06/28/2014    Past Surgical History  Procedure Laterality Date  . 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 Tonsillectomy      age 35  . Breast lumpectomy  1962    lt bx-neg  . Port a cath revision      put in 6/15  . Axillary lymph node biopsy Right 04/11/2014    Procedure: RIGHT AXILLARY LYMPH NODE BIOPSY;  Surgeon: Joyice Faster. Cornett, MD;  Location: [facility];  Service: General;  Laterality: Right;    Carlis Stable MS, [place], [place] Pager [phone] Weekend/After Hours Pager

## 2014-06-30 ENCOUNTER — Inpatient Hospital Stay (HOSPITAL_COMMUNITY): Payer: Medicare Other

## 2014-06-30 ENCOUNTER — Other Ambulatory Visit (HOSPITAL_COMMUNITY): Payer: Medicare Other

## 2014-06-30 LAB — ALBUMIN, FLUID (OTHER): Albumin, Fluid: 1.4 g/dL

## 2014-06-30 LAB — CBC
HEMATOCRIT: 30.9 % — AB (ref 36.0–46.0)
HEMOGLOBIN: 9.9 g/dL — AB (ref 12.0–15.0)
MCH: 31.6 pg (ref 26.0–34.0)
MCHC: 32 g/dL (ref 30.0–36.0)
MCV: 98.7 fL (ref 78.0–100.0)
Platelets: 152 10*3/uL (ref 150–400)
RBC: 3.13 MIL/uL — ABNORMAL LOW (ref 3.87–5.11)
RDW: 17.8 % — ABNORMAL HIGH (ref 11.5–15.5)
WBC: 10 10*3/uL (ref 4.0–10.5)

## 2014-06-30 LAB — PH, BODY FLUID: pH, Fluid: 7

## 2014-06-30 LAB — BODY FLUID CELL COUNT WITH DIFFERENTIAL: WBC FLUID: UNDETERMINED uL (ref 0–1000)

## 2014-06-30 LAB — LACTATE DEHYDROGENASE, PLEURAL OR PERITONEAL FLUID: LD FL: 3614 U/L

## 2014-06-30 MED ORDER — DILTIAZEM HCL 30 MG PO TABS
30.0000 mg | ORAL_TABLET | Freq: Four times a day (QID) | ORAL | Status: DC
  Administered 2014-06-30 – 2014-07-01 (×4): 30 mg via ORAL
  Filled 2014-06-30 (×7): qty 1

## 2014-06-30 MED ORDER — FUROSEMIDE 10 MG/ML IJ SOLN
20.0000 mg | Freq: Every day | INTRAMUSCULAR | Status: DC
  Filled 2014-06-30 (×2): qty 2

## 2014-06-30 MED ORDER — SODIUM CHLORIDE 0.9 % IJ SOLN
10.0000 mL | INTRAMUSCULAR | Status: DC | PRN
  Administered 2014-07-01 – 2014-07-05 (×6): 10 mL

## 2014-06-30 MED ORDER — POTASSIUM CHLORIDE CRYS ER 20 MEQ PO TBCR
40.0000 meq | EXTENDED_RELEASE_TABLET | Freq: Once | ORAL | Status: AC
  Administered 2014-06-30: 40 meq via ORAL

## 2014-06-30 NOTE — Progress Notes (Signed)
Patient ID: [name], female   DOB: 1936/01/29, 78 y.o.   MRN: [id number]  [facility] PROGRESS NOTE  [name] [id number] DOB: Feb 19, 1936 DOA: 06/28/2014 PCP: [name], MD  Brief narrative:  Patient is 78 year old female with multiple myeloma, presented to [facility] long emergency department with main concern of generalized weakness, palpitations, exertional shortness of breath, productive cough of yellow sputum, subjective fevers and chills, symptoms progressively worsening over the past 2 days. In emergency department she was found to be in atrial fibrillation with heart rate and 150s. She was started on IV Cardizem bolus with no response and has subsequently required Cardizem drip. Hospitalist asked to admit for further evaluation. Patient denies chest pain, no abdominal or urinary concerns.   Assessment and Plan:   Principal Problem:  Atrial fibrillation with RVR  - Patient admitted to step down unit due to requirement of Cardizem drip  - Heart rate in 80s to 90s this morning, in sinus rhythm with occasional ectopic beats - Plan to taper off Cardizem drip and transitioned to oral Cardizem 30 mg tablet by mouth every 6 hours - Repeat 12-lead EKG this morning - Followup on cardiology recommendations and transfer to telemetry if okay with cardiology  Active Problems:  Acute respiratory failure with hypoxia - Appears to be multifactorial and secondary to acute onset of atrial fibrillation with RVR, R pleural effusion, ? PNA based on patient's presenting symptoms  - Atrial fibrillation treated with Cardizem as noted above - Continue Zithromax day # 3 for treatment of presumptive pneumonia, sputum analysis, urine Legionella and strep pneumo negative to date - Repeat x-ray 10/03 with worsening right-sided pleural effusion, request interventional radiology consult for therapeutic and diagnostic thoracentesis  Questionable community-acquired pneumonia - No clear pathogen  isolated to this point, sputum culture, urine Legionella and strep pneumo negative to date - Continue empiric Zithromax day #3 Right-sided pleural effusion - Certainly worrisome for malignant process given known multiple myeloma, underlying diastolic congestive heart failure - Worsening on chest x-ray 06/30/2014 compared to chest x-ray on admission  - Will ask interventional radiology for assistance with thoracentesis  Acute on chronic diastolic congestive heart failure stage I - Weight trending down since admission: 147 pounds --> 145 pounds this morning - Mild interstitial edema also noted on the left this morning on chest x-ray - Will place on Lasix 20 mg IV daily for now and will monitor clinical response - 2-D [facility] 06/28/2014 notable for normal EF 01-60%, grade 1 diastolic dysfunction Multiple myeloma  - Appreciate Dr.Gorsuch assistance  Hyperlipidemia  - Stable, continue statin  Severe protein calorie malnutrition  - Secondary to acute on chronic illnesses outlined above  - Advance diet as patient able to tolerate and add nutritional supplements  Acute on chronic blood loss anemia  - Slight drop in hemoglobin since admission  - No signs of active bleeding, repeat CBC in the morning  Essential hypertension  - Reasonable inpatient control  Hypokalemia  - Continue to supplement and repeat BMP in the morning   DVT prophylaxis  Heparin SQ while pt is in hospital  Code Status: DNR  Family Communication: Pt and her husband at bedside  Disposition Plan: Transfer to telemetry  IV Access:   Peripheral IV Procedures and diagnostic studies:    CXR 06/28/2014 Enlarging right-sided pleural effusion. Chronic changes.   CXR 06/30/2014 Increased volume of the right pleural effusion since yesterday's study. Mild interstitial edema has developed on the left. Medical Consultants:   Cardiology  IR  for thoracentesis  Other Consultants:   None  Anti-Infectives:   Zithromax 10/01  -->  Kendra Ramsay, MD  [facility] Pager [phone]  If 7PM-7AM, please contact night-coverage [email or web address] Password [email or web address] 06/30/2014, 10:32 AM   LOS: 2 days   HPI/Subjective: No events overnight.   Objective: Filed Vitals:   06/30/14 0600 06/30/14 0757 06/30/14 0800 06/30/14 1017  BP: 145/76  148/72 137/53  Pulse: 96  88   Temp:  97.7 F (36.5 C)    TempSrc:  Axillary    Resp: 17  28   Height:      Weight:      SpO2: 93%  95%     Intake/Output Summary (Last 24 hours) at 06/30/14 1032 Last data filed at 06/30/14 0900  Gross per 24 hour  Intake    408 ml  Output    625 ml  Net   -217 ml    Exam:   General:  Pt is alert, follows commands appropriately, not in acute distress  Cardiovascular: Regular rate and rhythm, S1/S2, no murmurs, no rubs, no gallops  Respiratory: Bibasilar crackles, rales also noted R> L  Abdomen: Soft, non tender, non distended, bowel sounds present, no guarding  Extremities: No edema, pulses DP and PT palpable bilaterally  Neuro: Grossly nonfocal  Data Reviewed: Basic Metabolic Panel:  Recent Labs Lab 06/28/14 1352 06/28/14 2100 06/29/14 0230 06/29/14 0845  NA 146*  --  139 144  K 3.3*  --  3.1* 3.5*  CL  --   --  98 101  CO2 20*  --  20 20  GLUCOSE 99  --  96 95  BUN 11.0  --  11 11  CREATININE 0.8 0.75 0.76 0.74  CALCIUM 10.0  --  9.2 8.9   Liver Function Tests:  Recent Labs Lab 06/28/14 1352  AST 12  ALT 11  ALKPHOS 57  BILITOT 0.82  PROT 5.8*  ALBUMIN 3.0*   CBC:  Recent Labs Lab 06/28/14 1352 06/28/14 2100 06/29/14 0230 06/30/14 0425  WBC 9.2 8.7 9.4 10.0  NEUTROABS 6.1  --   --   --   HGB 11.4* 10.1* 9.9* 9.9*  HCT 36.0 31.2* 30.4* 30.9*  MCV 98.2 98.1 98.1 98.7  PLT 193 155 142* 152   Cardiac Enzymes:  Recent Labs Lab 06/28/14 1521 06/28/14 2100 06/29/14 0230 06/29/14 0845  CKTOTAL 18  --   --   --   CKMB 2.4  --   --   --   TROPONINI 0.08* <0.30 <0.30 <0.30   Recent Results (from the  past 240 hour(s))  MRSA PCR SCREENING     Status: None   Collection Time    06/29/14 12:08 AM      Result Value Ref Range Status   MRSA by PCR NEGATIVE  NEGATIVE Final   Comment:            The GeneXpert MRSA Assay (FDA     approved for NASAL specimens     only), is one component of a     comprehensive MRSA colonization     surveillance program. It is not     intended to diagnose MRSA     infection nor to guide or     monitor treatment for     MRSA infections.     Scheduled Meds: . acyclovir  400 mg Oral Daily  . aspirin EC  81 mg Oral Daily  . atorvastatin  20 mg Oral q1800  . azithromycin  500 mg Intravenous Q24H  . diltiazem  30 mg Oral 4 times per day  . guaiFENesin  600 mg Oral BID  . heparin  5,000 Units Subcutaneous 3 times per day  . magnesium hydroxide  30 mL Oral BID   Continuous Infusions: . sodium chloride 10 mL/hr at 06/29/14 2000

## 2014-06-30 NOTE — Consult Note (Signed)
CONSULTATION NOTE  Reason for Consult: A-fib with RVR, CHF  Requesting Physician: Dr. Doyle Askew  Cardiologist: Johnsie Cancel  HPI: This is a 78 y.o. female with a past medical history significant for paroxysmal atrial fibrillation (new diagnosis in 04/2012, after 2 prior falls, the first fall which resulted in a large subdural hematoma), hypertension, dyslipidemia, multiple myeloma, and a strong family history of heart disease. She saw Dr. Johnsie Cancel once, who maintained her B-blocker and ordered an echocardiogram.  Recently, she has been having exertional shortness of breath, productive cough, generalized weakness and palpitation.  She was noted to be in atrial fibrillation with rapid ventricular response in the oncology clinic and then sent to the emergency room with heart rate in the 150s and given IV Cardizem and admitted for rate control. She was also noted to be in acute heart failure with right-sided pleural effusion. She was given Zithromax for possible pneumonia and IR was counseled for thoracentesis. Ultimately, she did undergo right thoracentesis today, which removed 1.15 L of blood-tinged fluid. Her breathing is reportedly improved. She is also been on Lasix 20 mg IV daily. An echocardiogram on 06/28/2014 demonstrated an EF of 60-65% with grade 1 diastolic dysfunction. It appears that she converted back to sinus rhythm in the 80s and 90s yesterday, and is maintaining sinus rhythm at this time.  PMHx:  Past Medical History  Diagnosis Date  . Anxiety   . Hypertension   . Hyperlipidemia   . Depression   . Osteopenia   . Acute renal failure      due to hypercalcemia and suspected multiple myeloma resolved =with IVF's    . Lytic bone lesions on xray   . Acute subdural hematoma 03/2012     recent fall in hospital stay,resolved+  . Arthritis     spine  . Atrial fibrillation   . Radiation 05/09/12-05/18/12    20 gray in 8 Fx's right hip palliative  . Status post chemotherapy     velcade and  revlimid  . Drug-induced osteonecrosis of jaw   . Multiple myeloma 03/2012  . Cancer     mul;tiple myeloma, bone lesions r 7th rib,   . Lymphadenopathy, axillary 03/08/2014  . Normocytic anemia     due to chemo and multiple myeloma,no active bleding  . Mucositis (ulcerative) due to antineoplastic therapy 05/02/2014  . Dysuria 05/02/2014  . Pleural effusion 05/11/2014  . Atrial fibrillation with RVR 06/28/2014   Past Surgical History  Procedure Laterality Date  . 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 Tonsillectomy      age 74  . Breast lumpectomy  1962    lt bx-neg  . Port a cath revision      put in 6/15  . Axillary lymph node biopsy Right 04/11/2014    Procedure: RIGHT AXILLARY LYMPH NODE BIOPSY;  Surgeon: Joyice Faster. Cornett, MD;  Location: [place];  Service: General;  Laterality: Right;    FAMHx: Family History  Problem Relation Age of Onset  . Heart disease Mother   . Hyperlipidemia Sister   . Hypertension Sister   . Diabetes Sister   . Diabetes Sister   . Hyperlipidemia Sister   . Hypertension Sister   . Sudden death Neg Hx   . Heart attack Neg Hx     SOCHx:  reports that she has never smoked. She has never used smokeless tobacco. She reports that she does  not drink alcohol or use illicit drugs.  ALLERGIES: Allergies  Allergen Reactions  . Simvastatin Other (See Comments)    Unsure, Joint aches listed previously   . Zometa [Zoledronic Acid] Other (See Comments)    Osteonecrosis of the jaw    ROS: A comprehensive review of systems was negative except for: Constitutional: positive for fatigue Respiratory: positive for dyspnea on exertion  HOME MEDICATIONS: Prescriptions prior to admission  Medication Sig Dispense Refill  . acyclovir (ZOVIRAX) 400 MG tablet Take 400 mg by mouth daily.      Marland Kitchen ALPRAZolam (XANAX) 1 MG tablet Take 1 mg by mouth at bedtime  as needed for anxiety (anxiety).      Marland Kitchen aspirin EC 81 MG tablet Take 81 mg by mouth daily.      . Calcium Carbonate-Vitamin D (CALCIUM 500 + D PO) Take 1 tablet by mouth 2 (two) times daily.      Marland Kitchen dexamethasone (DECADRON) 4 MG tablet Take 12 mg by mouth once a week.      Marland Kitchen HYDROcodone-homatropine (HYCODAN) 5-1.5 MG/5ML syrup Take 5 mLs by mouth every 6 (six) hours as needed for cough (trouble sleeping).      Marland Kitchen lenalidomide (REVLIMID) 10 MG capsule Take 1 capsule (10 mg total) by mouth daily.  21 capsule  0  . levofloxacin (LEVAQUIN) 750 MG tablet Take 1 tablet (750 mg total) by mouth daily.  5 tablet  0  . losartan (COZAAR) 50 MG tablet Take 50 mg by mouth daily.      . Multiple Vitamin (MULTIVITAMIN WITH MINERALS) TABS Take 1 tablet by mouth daily.      . polymixin-bacitracin (POLYSPORIN) 500-10000 UNIT/GM OINT ointment Apply 1 application topically 2 (two) times daily. Generic ok  28.35 g  PRN  . potassium chloride SA (K-DUR,KLOR-CON) 20 MEQ tablet Take 1 tablet (20 mEq total) by mouth 2 (two) times daily.  14 tablet  0  . rosuvastatin (CRESTOR) 10 MG tablet Take 10 mg by mouth at bedtime.      Marland Kitchen tetrahydrozoline 0.05 % ophthalmic solution Place 1 drop into both eyes 2 (two) times daily as needed (redness/dry eyes).      Marland Kitchen oxyCODONE (ROXICODONE) 5 MG immediate release tablet Take 1 tablet (5 mg total) by mouth every 4 (four) hours as needed for severe pain.  90 tablet  0  . traZODone (DESYREL) 50 MG tablet Take 1 tablet (50 mg total) by mouth at bedtime as needed for sleep.  90 tablet  3    HOSPITAL MEDICATIONS: Prior to Admission:  Prescriptions prior to admission  Medication Sig Dispense Refill  . acyclovir (ZOVIRAX) 400 MG tablet Take 400 mg by mouth daily.      Marland Kitchen ALPRAZolam (XANAX) 1 MG tablet Take 1 mg by mouth at bedtime as needed for anxiety (anxiety).      Marland Kitchen aspirin EC 81 MG tablet Take 81 mg by mouth daily.      . Calcium Carbonate-Vitamin D (CALCIUM 500 + D PO) Take 1 tablet by  mouth 2 (two) times daily.      Marland Kitchen dexamethasone (DECADRON) 4 MG tablet Take 12 mg by mouth once a week.      Marland Kitchen HYDROcodone-homatropine (HYCODAN) 5-1.5 MG/5ML syrup Take 5 mLs by mouth every 6 (six) hours as needed for cough (trouble sleeping).      Marland Kitchen lenalidomide (REVLIMID) 10 MG capsule Take 1 capsule (10 mg total) by mouth daily.  21 capsule  0  . levofloxacin (LEVAQUIN) 750  MG tablet Take 1 tablet (750 mg total) by mouth daily.  5 tablet  0  . losartan (COZAAR) 50 MG tablet Take 50 mg by mouth daily.      . Multiple Vitamin (MULTIVITAMIN WITH MINERALS) TABS Take 1 tablet by mouth daily.      . polymixin-bacitracin (POLYSPORIN) 500-10000 UNIT/GM OINT ointment Apply 1 application topically 2 (two) times daily. Generic ok  28.35 g  PRN  . potassium chloride SA (K-DUR,KLOR-CON) 20 MEQ tablet Take 1 tablet (20 mEq total) by mouth 2 (two) times daily.  14 tablet  0  . rosuvastatin (CRESTOR) 10 MG tablet Take 10 mg by mouth at bedtime.      Marland Kitchen tetrahydrozoline 0.05 % ophthalmic solution Place 1 drop into both eyes 2 (two) times daily as needed (redness/dry eyes).      Marland Kitchen oxyCODONE (ROXICODONE) 5 MG immediate release tablet Take 1 tablet (5 mg total) by mouth every 4 (four) hours as needed for severe pain.  90 tablet  0  . traZODone (DESYREL) 50 MG tablet Take 1 tablet (50 mg total) by mouth at bedtime as needed for sleep.  90 tablet  3    VITALS: Blood pressure 111/51, pulse 92, temperature 97.9 F (36.6 C), temperature source Oral, resp. rate 22, height $RemoveBe'5\' 6"'[id number]$  (1.676 m), weight 143 lb (64.864 kg), SpO2 98.00%.  PHYSICAL EXAM: General appearance: alert and no distress Neck: no carotid bruit and no JVD Lungs: clear to auscultation bilaterally Heart: regular rate and rhythm, S1, S2 normal and systolic murmur: early systolic 3/6, crescendo at 2nd left intercostal space Abdomen: soft, non-tender; bowel sounds normal; no masses,  no organomegaly Extremities: extremities normal, atraumatic, no cyanosis  or edema Pulses: 2+ and symmetric Skin: Pale, warm, dry Neurologic: Mental status: Alert, oriented, thought content appropriate Psych: Pleasant mood, affect  LABS: Results for orders placed during the hospital encounter of 06/28/14 (from the past 48 hour(s))  PRO B NATRIURETIC PEPTIDE     Status: Abnormal   Collection Time    06/28/14  4:50 PM      Result Value Ref Range   Pro B Natriuretic peptide (BNP) 2526.0 (*) 0 - 450 pg/mL  I-STAT TROPOININ, ED     Status: None   Collection Time    06/28/14  4:57 PM      Result Value Ref Range   Troponin i, poc 0.04  0.00 - 0.08 ng/mL   Comment 3            Comment: Due to the release kinetics of cTnI,     a negative result within the first hours     of the onset of symptoms does not rule out     myocardial infarction with certainty.     If myocardial infarction is still suspected,     repeat the test at appropriate intervals.  CBC     Status: Abnormal   Collection Time    06/28/14  9:00 PM      Result Value Ref Range   WBC 8.7  4.0 - 10.5 K/uL   RBC 3.18 (*) 3.87 - 5.11 MIL/uL   Hemoglobin 10.1 (*) 12.0 - 15.0 g/dL   HCT 31.2 (*) 36.0 - 46.0 %   MCV 98.1  78.0 - 100.0 fL   MCH 31.8  26.0 - 34.0 pg   MCHC 32.4  30.0 - 36.0 g/dL   RDW 17.5 (*) 11.5 - 15.5 %   Platelets 155  150 - 400 K/uL  CREATININE, SERUM     Status: Abnormal   Collection Time    06/28/14  9:00 PM      Result Value Ref Range   Creatinine, Ser 0.75  0.50 - 1.10 mg/dL   GFR calc non Af Amer 80 (*) >90 mL/min   GFR calc Af Amer >90  >90 mL/min   Comment: (NOTE)     The eGFR has been calculated using the CKD EPI equation.     This calculation has not been validated in all clinical situations.     eGFR's persistently <90 mL/min signify possible Chronic Kidney     Disease.  TROPONIN I     Status: None   Collection Time    06/28/14  9:00 PM      Result Value Ref Range   Troponin I <0.30  <0.30 ng/mL   Comment:            Due to the release kinetics of cTnI,      a negative result within the first hours     of the onset of symptoms does not rule out     myocardial infarction with certainty.     If myocardial infarction is still suspected,     repeat the test at appropriate intervals.  MRSA PCR SCREENING     Status: None   Collection Time    06/29/14 12:08 AM      Result Value Ref Range   MRSA by PCR NEGATIVE  NEGATIVE   Comment:            The GeneXpert MRSA Assay (FDA     approved for NASAL specimens     only), is one component of a     comprehensive MRSA colonization     surveillance program. It is not     intended to diagnose MRSA     infection nor to guide or     monitor treatment for     MRSA infections.  BASIC METABOLIC PANEL     Status: Abnormal   Collection Time    06/29/14  2:30 AM      Result Value Ref Range   Sodium 139  137 - 147 mEq/L   Potassium 3.1 (*) 3.7 - 5.3 mEq/L   Chloride 98  96 - 112 mEq/L   CO2 20  19 - 32 mEq/L   Glucose, Bld 96  70 - 99 mg/dL   BUN 11  6 - 23 mg/dL   Creatinine, Ser 0.76  0.50 - 1.10 mg/dL   Calcium 9.2  8.4 - 10.5 mg/dL   GFR calc non Af Amer 80 (*) >90 mL/min   GFR calc Af Amer >90  >90 mL/min   Comment: (NOTE)     The eGFR has been calculated using the CKD EPI equation.     This calculation has not been validated in all clinical situations.     eGFR's persistently <90 mL/min signify possible Chronic Kidney     Disease.   Anion gap 21 (*) 5 - 15  CBC     Status: Abnormal   Collection Time    06/29/14  2:30 AM      Result Value Ref Range   WBC 9.4  4.0 - 10.5 K/uL   RBC 3.10 (*) 3.87 - 5.11 MIL/uL   Hemoglobin 9.9 (*) 12.0 - 15.0 g/dL   HCT 30.4 (*) 36.0 - 46.0 %   MCV 98.1  78.0 - 100.0 fL   MCH 31.9  26.0 - 34.0 pg   MCHC 32.6  30.0 - 36.0 g/dL   RDW 17.8 (*) 11.5 - 15.5 %   Platelets 142 (*) 150 - 400 K/uL  TROPONIN I     Status: None   Collection Time    06/29/14  2:30 AM      Result Value Ref Range   Troponin I <0.30  <0.30 ng/mL   Comment:            Due to the  release kinetics of cTnI,     a negative result within the first hours     of the onset of symptoms does not rule out     myocardial infarction with certainty.     If myocardial infarction is still suspected,     repeat the test at appropriate intervals.  TROPONIN I     Status: None   Collection Time    06/29/14  8:45 AM      Result Value Ref Range   Troponin I <0.30  <0.30 ng/mL   Comment:            Due to the release kinetics of cTnI,     a negative result within the first hours     of the onset of symptoms does not rule out     myocardial infarction with certainty.     If myocardial infarction is still suspected,     repeat the test at appropriate intervals.  BASIC METABOLIC PANEL     Status: Abnormal   Collection Time    06/29/14  8:45 AM      Result Value Ref Range   Sodium 144  137 - 147 mEq/L   Potassium 3.5 (*) 3.7 - 5.3 mEq/L   Chloride 101  96 - 112 mEq/L   CO2 20  19 - 32 mEq/L   Glucose, Bld 95  70 - 99 mg/dL   BUN 11  6 - 23 mg/dL   Creatinine, Ser 0.74  0.50 - 1.10 mg/dL   Calcium 8.9  8.4 - 10.5 mg/dL   GFR calc non Af Amer 81 (*) >90 mL/min   GFR calc Af Amer >90  >90 mL/min   Comment: (NOTE)     The eGFR has been calculated using the CKD EPI equation.     This calculation has not been validated in all clinical situations.     eGFR's persistently <90 mL/min signify possible Chronic Kidney     Disease.   Anion gap 23 (*) 5 - 15  STREP PNEUMONIAE URINARY ANTIGEN     Status: None   Collection Time    06/29/14  3:12 PM      Result Value Ref Range   Strep Pneumo Urinary Antigen NEGATIVE  NEGATIVE   Comment:            Infection due to S. pneumoniae     cannot be absolutely ruled out     since the antigen present     may be below the detection limit     of the test.     Performed at [facility]  CBC     Status: Abnormal   Collection Time    06/30/14  4:25 AM      Result Value Ref Range   WBC 10.0  4.0 - 10.5 K/uL   RBC 3.13 (*) 3.87 - 5.11  MIL/uL   Hemoglobin 9.9 (*) 12.0 - 15.0 g/dL   HCT 30.9 (*) 36.0 -  46.0 %   MCV 98.7  78.0 - 100.0 fL   MCH 31.6  26.0 - 34.0 pg   MCHC 32.0  30.0 - 36.0 g/dL   RDW 17.8 (*) 11.5 - 15.5 %   Platelets 152  150 - 400 K/uL  BODY FLUID CELL COUNT WITH DIFFERENTIAL     Status: Abnormal   Collection Time    06/30/14  2:01 PM      Result Value Ref Range   Fluid Type-FCT PLEURAL     Comment: RIGHT   Color, Fluid RED (*) YELLOW   Appearance, Fluid TURBID (*) CLEAR   WBC, Fluid UNABLE TO PERFORM COUNT DUE TO CLOT IN SPECIMEN  0 - 1000 cu mm   Other Cells, Fluid OTHER CELLS UNIDENTIFIED; SEE CYTOLOGY REPORT      IMAGING: Dg Chest 1 View  06/30/2014   CLINICAL DATA:  Status post right-sided thoracentesis.  EXAM: CHEST - 1 VIEW  COMPARISON:  Portable chest x-ray of earlier today  FINDINGS: The right lung is better aerated. The volume of pleural fluid has decreased. No postprocedure pneumothorax is evident. The left lung is well-expanded and grossly clear.  IMPRESSION: There is no postprocedure complication following right-sided thoracentesis. There is improved aeration of the right lung.   Electronically Signed   By: David  Martinique   On: 06/30/2014 14:53   Dg Chest Port 1 View  06/30/2014   CLINICAL DATA:  Reassess right pleural effusion.  EXAM: PORTABLE CHEST - 1 VIEW  COMPARISON:  Portable chest x-ray of June 28, 2014  FINDINGS: The volume of pleural fluid on the right has increased since yesterday's study. Only a small portion of the upper lobe remains aerated. There r is soft tissue fullness in the right hilar region. The left lung is well-expanded. The interstitial markings are more prominent today. There is no definite pleural effusion. The pulmonary vascularity is indistinct. The power port catheter tip overlies the midportion of the SVC. The bony thorax exhibits no acute abnormality.  IMPRESSION: Increased volume of the right pleural effusion since yesterday's study. Mild interstitial edema has  developed on the left.   Electronically Signed   By: David  Martinique   On: 06/30/2014 11:30   Dg Chest Portable 1 View  06/28/2014   CLINICAL DATA:  Cough and shortness of breath for 1 week, history of atrial fibrillation and multiple myeloma  EXAM: PORTABLE CHEST - 1 VIEW  COMPARISON:  05/17/2014 ,04/19/2014  FINDINGS: Cardiac shadow is mildly prominent. An enlarging right-sided pleural effusion is seen. Right-sided chest port is again noted and stable. The left lung is clear. Old rib fractures are noted on the left. There is irregularity of the seventh rib on the right similar to that seen on prior CT.  IMPRESSION: Enlarging right-sided pleural effusion.  Chronic changes.   Electronically Signed   By: Inez Catalina M.D.   On: 06/28/2014 16:47   US Thoracentesis Asp Pleural Space W/img Guide  06/30/2014   CLINICAL DATA:  Multiple myeloma. Enlarging right pleural effusion. Respiratory difficulty.  TECHNIQUE: THORACENTESIS WITH ULTRASOUND  Technique: The procedure, risks (including but not limited to bleeding, infection, organ damage ), benefits, and alternatives were explained to the patient. Questions regarding the procedure were encouraged and answered. The patient understands and consents to the procedure. Survey ultrasound of the right hemithorax was performed and an appropriate skin entry site was localized. Site was marked, prepped with Betadine, draped in usual sterile fashion, infiltrated locally with 1% lidocaine.  The  5-French Yueh sheath needle was advanced into the pleural space. Blood tinged fluid returned. 1.15 L was removed. A sample was sent for the requested laboratory studies. Post procedure imaging shows minimal residual fluid. The patient tolerated procedure well, with no immediate complication.  IMPRESSION: Technically successful ultrasound-guided right thoracentesis. Follow-up chest radiograph pending.   Electronically Signed   By: Arne Cleveland M.D.   On: 06/30/2014 14:27    HOSPITAL  PROBLEM LIST: Principal Problem:   Atrial fibrillation with RVR Active Problems:   Essential hypertension   Multiple myeloma   Hyperlipidemia   History of atrial fibrillation   Pleural effusion   URI, acute  IMPRESSION: 1.  Paryoxysmal A-fib with RVR - now reverted to sinus 2.  CHADSVASC score of 4 (HAS-BLED score of 3) 3.  Acute diastolic heart failure 4.  Pleural effusion s/p thoracentesis  RECOMMENDATION: 1. A-fib with RVR:  agree with oral diltiazem, would switch to long-acting 120 mg dose tomorrow if she remains stable. While she is at increased risk for stroke, she is at high risk for bleeding and has a prior history of subdural hematoma secondary to a fall, therefore she is not a candidate for warfarin or similar new generation anticoagulant. I would recommend continuing low-dose aspirin.  2. Acute diastolic heart failure: Status post thoracentesis with improvement in her breathing.  She appears close to euvolemic at this point. Continue IV lasix tomorrow and consider switching to oral Lasix 20 mg daily on Monday.  3. Right pleural effusion: Will have to await the results of pleural fluid studies. I suspect that this is transudate if despite the fact that it was blood tinged. Most likely related to diastolic heart failure.  Thank you for the consultation. We will continue to follow with you.  Time Spent Directly with Patient: 45 minutes  Pixie Casino, MD, [facility] Attending Cardiologist [facility]  HILTY,Kenneth C 06/30/2014, 3:49 PM

## 2014-06-30 NOTE — Progress Notes (Signed)
PATIENT [name] 1234 WENT TO XRAY FOR THORACENTESIS AROUND 1PM. REPORT GIVEN TO PAM RN 4WEST AND XRAY WILL TRANSFER PATIENT TO [id number]. FOR TELE.

## 2014-06-30 NOTE — Procedures (Signed)
US thoracentesis RIGHT 1.15l blood tinged No complication. CXR to follow. No blood loss. See complete dictation in [facility].

## 2014-07-01 LAB — CBC
HEMATOCRIT: 27.6 % — AB (ref 36.0–46.0)
HEMOGLOBIN: 8.9 g/dL — AB (ref 12.0–15.0)
MCH: 31.7 pg (ref 26.0–34.0)
MCHC: 32.2 g/dL (ref 30.0–36.0)
MCV: 98.2 fL (ref 78.0–100.0)
Platelets: 120 10*3/uL — ABNORMAL LOW (ref 150–400)
RBC: 2.81 MIL/uL — ABNORMAL LOW (ref 3.87–5.11)
RDW: 17.4 % — AB (ref 11.5–15.5)
WBC: 7.2 10*3/uL (ref 4.0–10.5)

## 2014-07-01 LAB — BASIC METABOLIC PANEL
Anion gap: 15 (ref 5–15)
BUN: 10 mg/dL (ref 6–23)
CHLORIDE: 99 meq/L (ref 96–112)
CO2: 24 mEq/L (ref 19–32)
Calcium: 7.7 mg/dL — ABNORMAL LOW (ref 8.4–10.5)
Creatinine, Ser: 0.62 mg/dL (ref 0.50–1.10)
GFR calc non Af Amer: 85 mL/min — ABNORMAL LOW (ref >90)
GLUCOSE: 67 mg/dL — AB (ref 70–99)
POTASSIUM: 3.2 meq/L — AB (ref 3.7–5.3)
Sodium: 138 mEq/L (ref 137–147)

## 2014-07-01 LAB — LEGIONELLA ANTIGEN, URINE

## 2014-07-01 LAB — MAGNESIUM: Magnesium: 2 mg/dL (ref 1.5–2.5)

## 2014-07-01 LAB — GLUCOSE, CAPILLARY: GLUCOSE-CAPILLARY: 109 mg/dL — AB (ref 70–99)

## 2014-07-01 MED ORDER — FUROSEMIDE 20 MG PO TABS
20.0000 mg | ORAL_TABLET | Freq: Every day | ORAL | Status: DC
  Administered 2014-07-01 – 2014-07-04 (×4): 20 mg via ORAL
  Filled 2014-07-01 (×5): qty 1

## 2014-07-01 MED ORDER — DILTIAZEM HCL ER COATED BEADS 180 MG PO CP24
180.0000 mg | ORAL_CAPSULE | Freq: Every day | ORAL | Status: DC
  Administered 2014-07-01 – 2014-07-02 (×2): 180 mg via ORAL
  Filled 2014-07-01 (×3): qty 1

## 2014-07-01 MED ORDER — POTASSIUM CHLORIDE CRYS ER 20 MEQ PO TBCR
40.0000 meq | EXTENDED_RELEASE_TABLET | ORAL | Status: AC
  Administered 2014-07-01 (×2): 40 meq via ORAL
  Filled 2014-07-01 (×2): qty 2

## 2014-07-01 NOTE — Progress Notes (Signed)
DAILY PROGRESS NOTE  Subjective:  No events overnight. Telemetry shows sinus with frequent PAC's. BP Stable. About 500 cc net negative recorded yesterday.  Objective:  Temp:  [97.7 F (36.5 C)-98.9 F (37.2 C)] 98.7 F (37.1 C) (10/04 0547) Pulse Rate:  [83-97] 89 (10/04 0547) Resp:  [20-29] 22 (10/04 0547) BP: (111-148)/(51-72) 116/65 mmHg (10/04 0547) SpO2:  [95 %-98 %] 96 % (10/04 0547) Weight:  [143 lb (64.864 kg)-144 lb 8 oz (65.545 kg)] 144 lb 8 oz (65.545 kg) (10/04 0547) Weight change:   Intake/Output from previous day: 10/03 0701 - 10/04 0700 In: 150 [P.O.:60; I.V.:90] Out: 650 [Urine:650]  Intake/Output from this shift: Total I/O In: -  Out: 400 [Urine:400]  Medications: Current Facility-Administered Medications  Medication Dose Route Frequency Provider Last Rate Last Dose  . 0.9 %  sodium chloride infusion   Intravenous Continuous Phillips Climes, MD 10 mL/hr at 07/01/14 0123    . acetaminophen (TYLENOL) tablet 650 mg  650 mg Oral Q6H PRN Phillips Climes, MD       Or  . acetaminophen (TYLENOL) suppository 650 mg  650 mg Rectal Q6H PRN Phillips Climes, MD      . acyclovir (ZOVIRAX) tablet 400 mg  400 mg Oral Daily Phillips Climes, MD   400 mg at 06/30/14 1154  . ALPRAZolam Duanne Moron) tablet 1 mg  1 mg Oral QHS PRN Phillips Climes, MD   1 mg at 06/30/14 2158  . alum & mag hydroxide-simeth (MAALOX/MYLANTA) 200-200-20 MG/5ML suspension 30 mL  30 mL Oral Q6H PRN Phillips Climes, MD   30 mL at 06/30/14 2158  . aspirin EC tablet 81 mg  81 mg Oral Daily Phillips Climes, MD   81 mg at 06/30/14 1020  . atorvastatin (LIPITOR) tablet 20 mg  20 mg Oral q1800 Phillips Climes, MD   20 mg at 06/30/14 1654  . azithromycin (ZITHROMAX) 500 mg in dextrose 5 % 250 mL IVPB  500 mg Intravenous Q24H Phillips Climes, MD 250 mL/hr at 06/30/14 1654 500 mg at 06/30/14 1654  . bisacodyl (DULCOLAX) suppository 10 mg  10 mg Rectal Daily PRN Phillips Climes, MD      . diltiazem  (CARDIZEM) tablet 30 mg  30 mg Oral 4 times per day Theodis Blaze, MD   30 mg at 07/01/14 0521  . feeding supplement (ENSURE) (ENSURE) pudding 1 Container  1 Container Oral TID BM Theodis Blaze, MD   1 Container at 06/30/14 2011  . furosemide (LASIX) injection 20 mg  20 mg Intravenous Daily Theodis Blaze, MD      . guaiFENesin [facility]) 12 hr tablet 600 mg  600 mg Oral BID Phillips Climes, MD   600 mg at 06/30/14 2158  . heparin injection 5,000 Units  5,000 Units Subcutaneous 3 times per day Phillips Climes, MD   5,000 Units at 07/01/14 0521  . HYDROcodone-homatropine (HYCODAN) 5-1.5 MG/5ML syrup 5 mL  5 mL Oral Q6H PRN Phillips Climes, MD   5 mL at 06/30/14 2203  . lactose free nutrition (BOOST PLUS) liquid 237 mL  237 mL Oral PRN Toribio Harbour, RD   237 mL at 06/29/14 1945  . magnesium hydroxide (MILK OF MAGNESIA) suspension 30 mL  30 mL Oral BID Theodis Blaze, MD   30 mL at 06/29/14 1057  . multivitamin with minerals tablet 1 tablet  1 tablet Oral Daily Phillips Climes, MD   1 tablet at 06/30/14 1027  . ondansetron (ZOFRAN) tablet 4 mg  4 mg Oral Q6H PRN Phillips Climes, MD       Or  . ondansetron (ZOFRAN) injection 4 mg  4 mg Intravenous Q6H PRN Phillips Climes, MD      . oxyCODONE (Oxy IR/ROXICODONE) immediate release tablet 5 mg  5 mg Oral Q4H PRN Phillips Climes, MD   5 mg at 06/30/14 0557  . senna (SENOKOT) tablet 8.6 mg  1 tablet Oral BID Phillips Climes, MD   8.6 mg at 06/30/14 2158  . sodium chloride 0.9 % injection 10-40 mL  10-40 mL Intracatheter PRN Theodis Blaze, MD   10 mL at 07/01/14 0459  . sodium chloride 0.9 % injection 3 mL  3 mL Intravenous Q12H Phillips Climes, MD   3 mL at 06/29/14 1058    Physical Exam: General appearance: alert and no distress Neck: no carotid bruit and no JVD Lungs: diminished breath sounds RLL Heart: regular rate and rhythm, S1, S2 normal and systolic murmur: early systolic 3/6, crescendo at 2nd right intercostal space Extremities:  extremities normal, atraumatic, no cyanosis or edema  Lab Results: Results for orders placed during the hospital encounter of 06/28/14 (from the past 48 hour(s))  TROPONIN I     Status: None   Collection Time    06/29/14  8:45 AM      Result Value Ref Range   Troponin I <0.30  <0.30 ng/mL   Comment:            Due to the release kinetics of cTnI,     a negative result within the first hours     of the onset of symptoms does not rule out     myocardial infarction with certainty.     If myocardial infarction is still suspected,     repeat the test at appropriate intervals.  BASIC METABOLIC PANEL     Status: Abnormal   Collection Time    06/29/14  8:45 AM      Result Value Ref Range   Sodium 144  137 - 147 mEq/L   Potassium 3.5 (*) 3.7 - 5.3 mEq/L   Chloride 101  96 - 112 mEq/L   CO2 20  19 - 32 mEq/L   Glucose, Bld 95  70 - 99 mg/dL   BUN 11  6 - 23 mg/dL   Creatinine, Ser 0.74  0.50 - 1.10 mg/dL   Calcium 8.9  8.4 - 10.5 mg/dL   GFR calc non Af Amer 81 (*) >90 mL/min   GFR calc Af Amer >90  >90 mL/min   Comment: (NOTE)     The eGFR has been calculated using the CKD EPI equation.     This calculation has not been validated in all clinical situations.     eGFR's persistently <90 mL/min signify possible Chronic Kidney     Disease.   Anion gap 23 (*) 5 - 15  STREP PNEUMONIAE URINARY ANTIGEN     Status: None   Collection Time    06/29/14  3:12 PM      Result Value Ref Range   Strep Pneumo Urinary Antigen NEGATIVE  NEGATIVE   Comment:            Infection due to S. pneumoniae     cannot be absolutely ruled out     since the antigen present     may be below the detection limit     of the test.     Performed at [place]  Status: Abnormal   Collection Time    06/30/14  4:25 AM      Result Value Ref Range   WBC 10.0  4.0 - 10.5 K/uL   RBC 3.13 (*) 3.87 - 5.11 MIL/uL   Hemoglobin 9.9 (*) 12.0 - 15.0 g/dL   HCT 30.9 (*) 36.0 - 46.0 %   MCV 98.7  78.0 -  100.0 fL   MCH 31.6  26.0 - 34.0 pg   MCHC 32.0  30.0 - 36.0 g/dL   RDW 17.8 (*) 11.5 - 15.5 %   Platelets 152  150 - 400 K/uL  ALBUMIN, FLUID     Status: None   Collection Time    06/30/14  2:01 PM      Result Value Ref Range   Albumin, Fluid 1.4     Comment: NO NORMAL RANGE ESTABLISHED FOR THIS TEST     PERFORMED AT [facility]     Performed at [facility]   Fluid Type-FALB PLEURAL     Comment: RIGHT  BODY FLUID CELL COUNT WITH DIFFERENTIAL     Status: Abnormal   Collection Time    06/30/14  2:01 PM      Result Value Ref Range   Fluid Type-FCT PLEURAL     Comment: RIGHT   Color, Fluid RED (*) YELLOW   Appearance, Fluid TURBID (*) CLEAR   WBC, Fluid UNABLE TO PERFORM COUNT DUE TO CLOT IN SPECIMEN  0 - 1000 cu mm   Other Cells, Fluid OTHER CELLS UNIDENTIFIED; SEE CYTOLOGY REPORT    LACTATE DEHYDROGENASE, BODY FLUID     Status: None   Collection Time    06/30/14  2:01 PM      Result Value Ref Range   LD, Fluid 3614     Comment: RESULTS CONFIRMED BY MANUAL DILUTION     Performed at [facility]   Fluid Type-FLDH PLEURAL     Comment: RIGHT  PH, BODY FLUID     Status: None   Collection Time    06/30/14  2:01 PM      Result Value Ref Range   pH, Fluid Type PLEURAL     pH, Fluid 7.00     Comment: Performed at [place]     Status: None   Collection Time    06/30/14  2:01 PM      Result Value Ref Range   Specimen Description PLEURAL RIGHT     Special Requests NONE     Gram Stain       Value: ABUNDANT WBC PRESENT, PREDOMINANTLY MONONUCLEAR     NO ORGANISMS SEEN     Performed at [facility]   Culture PENDING     Report Status PENDING    CBC     Status: Abnormal   Collection Time    07/01/14  4:10 AM      Result Value Ref Range   WBC 7.2  4.0 - 10.5 K/uL   RBC 2.81 (*) 3.87 - 5.11 MIL/uL   Hemoglobin 8.9 (*) 12.0 - 15.0 g/dL   HCT 27.6 (*) 36.0 - 46.0 %   MCV 98.2  78.0 - 100.0 fL   MCH 31.7  26.0 -  34.0 pg   MCHC 32.2  30.0 - 36.0 g/dL   RDW 17.4 (*) 11.5 - 15.5 %   Platelets 120 (*) 150 - 400 K/uL  BASIC METABOLIC PANEL     Status: Abnormal  Collection Time    07/01/14  4:10 AM      Result Value Ref Range   Sodium 138  137 - 147 mEq/L   Potassium 3.2 (*) 3.7 - 5.3 mEq/L   Chloride 99  96 - 112 mEq/L   CO2 24  19 - 32 mEq/L   Glucose, Bld 67 (*) 70 - 99 mg/dL   BUN 10  6 - 23 mg/dL   Creatinine, Ser 0.62  0.50 - 1.10 mg/dL   Calcium 7.7 (*) 8.4 - 10.5 mg/dL   GFR calc non Af Amer 85 (*) >90 mL/min   GFR calc Af Amer >90  >90 mL/min   Comment: (NOTE)     The eGFR has been calculated using the CKD EPI equation.     This calculation has not been validated in all clinical situations.     eGFR's persistently <90 mL/min signify possible Chronic Kidney     Disease.   Anion gap 15  5 - 15    Imaging: Dg Chest 1 View  06/30/2014   CLINICAL DATA:  Status post right-sided thoracentesis.  EXAM: CHEST - 1 VIEW  COMPARISON:  Portable chest x-ray of earlier today  FINDINGS: The right lung is better aerated. The volume of pleural fluid has decreased. No postprocedure pneumothorax is evident. The left lung is well-expanded and grossly clear.  IMPRESSION: There is no postprocedure complication following right-sided thoracentesis. There is improved aeration of the right lung.   Electronically Signed   By: David  Martinique   On: 06/30/2014 14:53   Dg Chest Port 1 View  06/30/2014   CLINICAL DATA:  Reassess right pleural effusion.  EXAM: PORTABLE CHEST - 1 VIEW  COMPARISON:  Portable chest x-ray of June 28, 2014  FINDINGS: The volume of pleural fluid on the right has increased since yesterday's study. Only a small portion of the upper lobe remains aerated. There r is soft tissue fullness in the right hilar region. The left lung is well-expanded. The interstitial markings are more prominent today. There is no definite pleural effusion. The pulmonary vascularity is indistinct. The power port catheter  tip overlies the midportion of the SVC. The bony thorax exhibits no acute abnormality.  IMPRESSION: Increased volume of the right pleural effusion since yesterday's study. Mild interstitial edema has developed on the left.   Electronically Signed   By: David  Martinique   On: 06/30/2014 11:30   US Thoracentesis Asp Pleural Space W/img Guide  06/30/2014   CLINICAL DATA:  Multiple myeloma. Enlarging right pleural effusion. Respiratory difficulty.  TECHNIQUE: THORACENTESIS WITH ULTRASOUND  Technique: The procedure, risks (including but not limited to bleeding, infection, organ damage ), benefits, and alternatives were explained to the patient. Questions regarding the procedure were encouraged and answered. The patient understands and consents to the procedure. Survey ultrasound of the right hemithorax was performed and an appropriate skin entry site was localized. Site was marked, prepped with Betadine, draped in usual sterile fashion, infiltrated locally with 1% lidocaine.  The 5-French Yueh sheath needle was advanced into the pleural space. Blood tinged fluid returned. 1.15 L was removed. A sample was sent for the requested laboratory studies. Post procedure imaging shows minimal residual fluid. The patient tolerated procedure well, with no immediate complication.  IMPRESSION: Technically successful ultrasound-guided right thoracentesis. Follow-up chest radiograph pending.   Electronically Signed   By: Arne Cleveland M.D.   On: 06/30/2014 14:27    Assessment:  1. Principal Problem: 2.   Atrial fibrillation with  RVR 3. Active Problems: 4.   Essential hypertension 5.   Multiple myeloma 6.   Hyperlipidemia 7.   History of atrial fibrillation 8.   Pleural effusion 9.   URI, acute 10.   Plan:  1. Diuresed some overnight. Maintaining sinus with PAC's. Hypokalemic again today. Switch to lasix 20 mg po daily today. Change cardizem over to cardizem LA, increase total dose slightly to 180 mg daily. Replete  potassium and check magnesium (replete if necessary).  Time Spent Directly with Patient:  15 minutes  Length of Stay:  LOS: 3 days   Pixie Casino, MD, [facility] Attending Cardiologist [facility]  HILTY,Kenneth C 07/01/2014, 6:59 AM

## 2014-07-01 NOTE — Progress Notes (Signed)
Patient ID: [name], female   DOB: 03-Apr-1936, 78 y.o.   MRN: [id number]  [facility] PROGRESS NOTE  CARIGAN LISTER GYK:[id number] DOB: 05/26/1936 DOA: 06/28/2014 PCP: [name], MD  Brief narrative:  Patient is 78 year old female with multiple myeloma, presented to [facility] emergency department with main concern of generalized weakness, palpitations, exertional shortness of breath, productive cough of yellow sputum, subjective fevers and chills, symptoms progressively worsening over the past 2 days. In emergency department she was found to be in atrial fibrillation with heart rate and 150s. She was started on IV Cardizem bolus with no response and has subsequently required Cardizem drip. Hospitalist asked to admit for further evaluation. Patient denies chest pain, no abdominal or urinary concerns.   Assessment and Plan:   Principal Problem:  Atrial fibrillation with RVR  - Patient admitted to step down unit due to requirement of Cardizem drip  - Heart rate in 80s to 90s this morning, in sinus rhythm with occasional ectopic beats  - continue Cardizem per cardiology rec's Active Problems:  Acute respiratory failure with hypoxia  - Appears to be multifactorial and secondary to acute onset of atrial fibrillation with RVR, R pleural effusion, ? PNA based on patient's presenting symptoms  - Atrial fibrillation treated with Cardizem as noted above  - Continue Zithromax day # 4 for treatment of ? PNA, sputum analysis, urine Legionella and strep pneumo negative to date  - Repeat x-ray 10/03 with worsening right-sided pleural effusion, requested IR consult for therapeutic and diagnostic thoracentesis - pt is now s/p right side thoracentesis, post op day #1, 1/1 L removed and pt clinically stable this MA  Questionable community-acquired pneumonia  - No clear pathogen isolated to this point, sputum culture, urine Legionella and strep pneumo negative to date  - Continue empiric  Zithromax day #4 Right-sided pleural effusion  - Certainly worrisome for malignant process given known multiple myeloma, underlying diastolic congestive heart failure  - Worsening on chest x-ray 06/30/2014 compared to chest x-ray on admission  - s/p thoracentesis, right side, 1.1 L removed, follow up on studies  Acute on chronic diastolic congestive heart failure stage I  - Weight trending down since admission: 147 pounds --> 145 pounds --> 144 lbs this morning  - Mild interstitial edema also noted on the left this morning on chest x-ray  - Will place on Lasix 20 mg IV daily for now and will monitor clinical response  - 2-D [facility] 06/28/2014 notable for normal EF 79-39%, grade 1 diastolic dysfunction  Multiple myeloma  - Appreciate Dr.Gorsuch assistance  Hyperlipidemia  - Stable, continue statin  Severe protein calorie malnutrition  - Secondary to acute on chronic illnesses outlined above  - Advance diet as patient able to tolerate and add nutritional supplements  Acute on chronic blood loss anemia  - Slight drop in hemoglobin since admission  - No signs of active bleeding, repeat CBC in the morning  Essential hypertension  - Reasonable inpatient control  Hypokalemia  - Continue to supplement and repeat BMP in the morning   DVT prophylaxis  Heparin SQ while pt is in hospital  Code Status: DNR  Family Communication: Pt and her husband at bedside  Disposition Plan: Remains inpatient   IV Access:   Peripheral IV Procedures and diagnostic studies:   CXR 06/28/2014 Enlarging right-sided pleural effusion. Chronic changes.  CXR 06/30/2014 Increased volume of the right pleural effusion since yesterday's study. Mild interstitial edema has developed on the left. Medical Consultants:  Cardiology  IR for thoracentesis  Other Consultants:   None  Anti-Infectives:   Zithromax 10/01 -->  Faye Ramsay, MD  [facility] Pager [phone]  If 7PM-7AM, please contact  night-coverage [email or web address] Password [email or web address] 07/01/2014, 2:21 PM   LOS: 3 days   HPI/Subjective: No events overnight.   Objective: Filed Vitals:   06/30/14 1503 06/30/14 2152 07/01/14 0547 07/01/14 1343  BP: 111/51 120/63 116/65 126/62  Pulse: 92 83 89 94  Temp: 97.9 F (36.6 C) 98.9 F (37.2 C) 98.7 F (37.1 C) 97.6 F (36.4 C)  TempSrc: Oral Oral Oral Axillary  Resp: _0 Height:      Weight: 64.864 kg (143 lb)  65.545 kg (144 lb 8 oz)   SpO2: 98% 97% 96% 100%    Intake/Output Summary (Last 24 hours) at 07/01/14 1421 Last data filed at 07/01/14 0900  Gross per 24 hour  Intake    180 ml  Output    400 ml  Net   -220 ml    Exam:   General:  Pt is alert, follows commands appropriately, not in acute distress  Cardiovascular: Regular rate and rhythm, S1/S2, no murmurs, no rubs, no gallops  Respiratory: Clear to auscultation bilaterally, crackles on the right side > left side   Abdomen: Soft, non tender, non distended, bowel sounds present, no guarding  Extremities: pulses DP and PT palpable bilaterally  Neuro: Grossly nonfocal  Data Reviewed: Basic Metabolic Panel:  Recent Labs Lab 06/28/14 1352 06/28/14 2100 06/29/14 0230 06/29/14 0845 07/01/14 0410  NA 146*  --  139 144 138  K 3.3*  --  3.1* 3.5* 3.2*  CL  --   --  98 101 99  CO2 20*  --  _1 GLUCOSE 99  --  96 95 67*  BUN 11.0  --  _2 CREATININE 0.8 0.75 0.76 0.74 0.62  CALCIUM 10.0  --  9.2 8.9 7.7*  MG  --   --   --   --  2.0   Liver Function Tests:  Recent Labs Lab 06/28/14 1352  AST 12  ALT 11  ALKPHOS 57  BILITOT 0.82  PROT 5.8*  ALBUMIN 3.0*   CBC:  Recent Labs Lab 06/28/14 1352 06/28/14 2100 06/29/14 0230 06/30/14 0425 07/01/14 0410  WBC 9.2 8.7 9.4 10.0 7.2  NEUTROABS 6.1  --   --   --   --   HGB 11.4* 10.1* 9.9* 9.9* 8.9*  HCT 36.0 31.2* 30.4* 30.9* 27.6*  MCV 98.2 98.1 98.1 98.7 98.2  PLT 193 155 142* 152 120*   Cardiac Enzymes:  Recent  Labs Lab 06/28/14 1521 06/28/14 2100 06/29/14 0230 06/29/14 0845  CKTOTAL 18  --   --   --   CKMB 2.4  --   --   --   TROPONINI 0.08* <0.30 <0.30 <0.30   CBG:  Recent Labs Lab 07/01/14 1152  GLUCAP 109*    Recent Results (from the past 240 hour(s))  MRSA PCR SCREENING     Status: None   Collection Time    06/29/14 12:08 AM      Result Value Ref Range Status   MRSA by PCR NEGATIVE  NEGATIVE Final   Comment:            The GeneXpert MRSA Assay (FDA     approved for NASAL specimens     only), is one component of a     comprehensive MRSA colonization  surveillance program. It is not     intended to diagnose MRSA     infection nor to guide or     monitor treatment for     MRSA infections.  BODY FLUID CULTURE     Status: None   Collection Time    06/30/14  2:01 PM      Result Value Ref Range Status   Specimen Description PLEURAL RIGHT   Final   Special Requests NONE   Final   Gram Stain     Final   Value: ABUNDANT WBC PRESENT, PREDOMINANTLY MONONUCLEAR     NO ORGANISMS SEEN     Performed at [facility]   Culture     Final   Value: NO GROWTH 1 DAY     Performed at [facility]   Report Status PENDING   Incomplete     Scheduled Meds: . acyclovir  400 mg Oral Daily  . aspirin EC  81 mg Oral Daily  . atorvastatin  20 mg Oral q1800  . azithromycin  500 mg Intravenous Q24H  . diltiazem  180 mg Oral Daily  . feeding supplement (ENSURE)  1 Container Oral TID BM  . furosemide  20 mg Oral Daily  . guaiFENesin  600 mg Oral BID  . heparin  5,000 Units Subcutaneous 3 times per day  . magnesium hydroxide  30 mL Oral BID  . multivitamin with minerals  1 tablet Oral Daily  . senna  1 tablet Oral BID  . sodium chloride  3 mL Intravenous Q12H   Continuous Infusions: . sodium chloride 10 mL/hr at 07/01/14 0123

## 2014-07-02 ENCOUNTER — Inpatient Hospital Stay (HOSPITAL_COMMUNITY): Payer: Medicare Other

## 2014-07-02 DIAGNOSIS — J90 Pleural effusion, not elsewhere classified: Secondary | ICD-10-CM

## 2014-07-02 DIAGNOSIS — J069 Acute upper respiratory infection, unspecified: Secondary | ICD-10-CM

## 2014-07-02 DIAGNOSIS — D6481 Anemia due to antineoplastic chemotherapy: Secondary | ICD-10-CM

## 2014-07-02 LAB — CBC
HEMATOCRIT: 26.5 % — AB (ref 36.0–46.0)
Hemoglobin: 8.6 g/dL — ABNORMAL LOW (ref 12.0–15.0)
MCH: 32.6 pg (ref 26.0–34.0)
MCHC: 32.5 g/dL (ref 30.0–36.0)
MCV: 100.4 fL — AB (ref 78.0–100.0)
PLATELETS: 113 10*3/uL — AB (ref 150–400)
RBC: 2.64 MIL/uL — ABNORMAL LOW (ref 3.87–5.11)
RDW: 17.6 % — AB (ref 11.5–15.5)
WBC: 7 10*3/uL (ref 4.0–10.5)

## 2014-07-02 LAB — BASIC METABOLIC PANEL
Anion gap: 18 — ABNORMAL HIGH (ref 5–15)
BUN: 10 mg/dL (ref 6–23)
CALCIUM: 8.2 mg/dL — AB (ref 8.4–10.5)
CHLORIDE: 101 meq/L (ref 96–112)
CO2: 24 mEq/L (ref 19–32)
CREATININE: 0.63 mg/dL (ref 0.50–1.10)
GFR calc Af Amer: >90 mL/min (ref >90)
GFR, EST NON AFRICAN AMERICAN: 85 mL/min — AB (ref >90)
Glucose, Bld: 71 mg/dL (ref 70–99)
Potassium: 4 mEq/L (ref 3.7–5.3)
Sodium: 143 mEq/L (ref 137–147)

## 2014-07-02 NOTE — Progress Notes (Signed)
Patient ID: [name], female   DOB: [birth year], 78 y.o.   MRN: [id number]    DAILY PROGRESS NOTE  Subjective:  Breathing better no palpitations  Objective:  Temp:  [97.6 F (36.4 C)-98.5 F (36.9 C)] 98.5 F (36.9 C) (10/05 0355) Pulse Rate:  [85-94] 85 (10/05 0355) Resp:  [20] 20 (10/05 0355) BP: (100-126)/(62-78) 100/78 mmHg (10/05 0355) SpO2:  [96 %-100 %] 96 % (10/05 0355) Weight:  [144 lb 3.2 oz (65.409 kg)] 144 lb 3.2 oz (65.409 kg) (10/05 0500) Weight change: 1 lb 3.2 oz (0.544 kg)  Intake/Output from previous day: 10/04 0701 - 10/05 0700 In: 2250 [P.O.:1930; I.V.:320] Out: 500 [Urine:500]  Intake/Output from this shift:    Medications: Current Facility-Administered Medications  Medication Dose Route Frequency Provider Last Rate Last Dose  . 0.9 %  sodium chloride infusion   Intravenous Continuous Phillips Climes, MD 10 mL/hr at 07/01/14 0123    . acetaminophen (TYLENOL) tablet 650 mg  650 mg Oral Q6H PRN Phillips Climes, MD   650 mg at 07/02/14 0128   Or  . acetaminophen (TYLENOL) suppository 650 mg  650 mg Rectal Q6H PRN Phillips Climes, MD      . acyclovir (ZOVIRAX) tablet 400 mg  400 mg Oral Daily Phillips Climes, MD   400 mg at 07/01/14 1040  . ALPRAZolam Duanne Moron) tablet 1 mg  1 mg Oral QHS PRN Phillips Climes, MD   1 mg at 07/01/14 2127  . alum & mag hydroxide-simeth (MAALOX/MYLANTA) 200-200-20 MG/5ML suspension 30 mL  30 mL Oral Q6H PRN Phillips Climes, MD   30 mL at 06/30/14 2158  . aspirin EC tablet 81 mg  81 mg Oral Daily Phillips Climes, MD   81 mg at 07/01/14 1041  . atorvastatin (LIPITOR) tablet 20 mg  20 mg Oral q1800 Phillips Climes, MD   20 mg at 07/01/14 1740  . azithromycin (ZITHROMAX) 500 mg in dextrose 5 % 250 mL IVPB  500 mg Intravenous Q24H Phillips Climes, MD 250 mL/hr at 07/01/14 1740 500 mg at 07/01/14 1740  . bisacodyl (DULCOLAX) suppository 10 mg  10 mg Rectal Daily PRN Phillips Climes, MD      . diltiazem (CARDIZEM CD) 24 hr  capsule 180 mg  180 mg Oral Daily Pixie Casino, MD   180 mg at 07/01/14 1041  . feeding supplement (ENSURE) (ENSURE) pudding 1 Container  1 Container Oral TID BM Theodis Blaze, MD   1 Container at 07/01/14 2128  . furosemide (LASIX) tablet 20 mg  20 mg Oral Daily Pixie Casino, MD   20 mg at 07/01/14 1041  . guaiFENesin (MUCINEX) 12 hr tablet 600 mg  600 mg Oral BID Phillips Climes, MD   600 mg at 07/01/14 2128  . heparin injection 5,000 Units  5,000 Units Subcutaneous 3 times per day Phillips Climes, MD   5,000 Units at 07/02/14 0612  . HYDROcodone-homatropine (HYCODAN) 5-1.5 MG/5ML syrup 5 mL  5 mL Oral Q6H PRN Phillips Climes, MD   5 mL at 07/01/14 2218  . lactose free nutrition (BOOST PLUS) liquid 237 mL  237 mL Oral PRN Toribio Harbour, RD   237 mL at 06/29/14 1945  . magnesium hydroxide (MILK OF MAGNESIA) suspension 30 mL  30 mL Oral BID Theodis Blaze, MD   30 mL at 07/01/14 1040  . multivitamin with minerals tablet 1 tablet  1 tablet Oral Daily Phillips Climes, MD   1 tablet at 07/01/14 1041  .  ondansetron (ZOFRAN) tablet 4 mg  4 mg Oral Q6H PRN Phillips Climes, MD       Or  . ondansetron (ZOFRAN) injection 4 mg  4 mg Intravenous Q6H PRN Phillips Climes, MD      . oxyCODONE (Oxy IR/ROXICODONE) immediate release tablet 5 mg  5 mg Oral Q4H PRN Phillips Climes, MD   5 mg at 07/02/14 0128  . senna (SENOKOT) tablet 8.6 mg  1 tablet Oral BID Phillips Climes, MD   8.6 mg at 07/01/14 1046  . sodium chloride 0.9 % injection 10-40 mL  10-40 mL Intracatheter PRN Theodis Blaze, MD   10 mL at 07/02/14 0354  . sodium chloride 0.9 % injection 3 mL  3 mL Intravenous Q12H Phillips Climes, MD   3 mL at 06/29/14 1058    Physical Exam: Chronically ill frail female General appearance: alert and no distress Neck: no carotid bruit and no JVD Lungs: diminished breath sounds RLL Heart: regular rate and rhythm, S1, S2 normal and systolic murmur: early systolic 3/6, crescendo at 2nd right  intercostal space Extremities: extremities normal, atraumatic, no cyanosis or edema  Lab Results: Results for orders placed during the hospital encounter of 06/28/14 (from the past 48 hour(s))  ALBUMIN, FLUID     Status: None   Collection Time    06/30/14  2:01 PM      Result Value Ref Range   Albumin, Fluid 1.4     Comment: NO NORMAL RANGE ESTABLISHED FOR THIS TEST     PERFORMED AT [facility]     Performed at [place]: RIGHT  BODY FLUID CELL COUNT WITH DIFFERENTIAL     Status: Abnormal   Collection Time    06/30/14  2:01 PM      Result Value Ref Range   Fluid Type-FCT PLEURAL     Comment: RIGHT   Color, Fluid RED (*) YELLOW   Appearance, Fluid TURBID (*) CLEAR   WBC, Fluid UNABLE TO PERFORM COUNT DUE TO CLOT IN SPECIMEN  0 - 1000 cu mm   Other Cells, Fluid OTHER CELLS UNIDENTIFIED; SEE CYTOLOGY REPORT    LACTATE DEHYDROGENASE, BODY FLUID     Status: None   Collection Time    06/30/14  2:01 PM      Result Value Ref Range   LD, Fluid 3614     Comment: RESULTS CONFIRMED BY MANUAL DILUTION     Performed at [facility]   Fluid Type-FLDH PLEURAL     Comment: RIGHT  PH, BODY FLUID     Status: None   Collection Time    06/30/14  2:01 PM      Result Value Ref Range   pH, Fluid Type PLEURAL     pH, Fluid 7.00     Comment: Performed at [facility]     Status: None   Collection Time    06/30/14  2:01 PM      Result Value Ref Range   Specimen Description PLEURAL RIGHT     Special Requests NONE     Gram Stain       Value: ABUNDANT WBC PRESENT, PREDOMINANTLY MONONUCLEAR     NO ORGANISMS SEEN     Performed at [facility]   Culture       Value: NO GROWTH 1 DAY     Performed at [facility]   Report Status  PENDING    CBC     Status: Abnormal   Collection Time    07/01/14  4:10 AM      Result Value Ref Range   WBC 7.2  4.0 - 10.5 K/uL   RBC 2.81 (*) 3.87 - 5.11  MIL/uL   Hemoglobin 8.9 (*) 12.0 - 15.0 g/dL   HCT 27.6 (*) 36.0 - 46.0 %   MCV 98.2  78.0 - 100.0 fL   MCH 31.7  26.0 - 34.0 pg   MCHC 32.2  30.0 - 36.0 g/dL   RDW 17.4 (*) 11.5 - 15.5 %   Platelets 120 (*) 150 - 400 K/uL  BASIC METABOLIC PANEL     Status: Abnormal   Collection Time    07/01/14  4:10 AM      Result Value Ref Range   Sodium 138  137 - 147 mEq/L   Potassium 3.2 (*) 3.7 - 5.3 mEq/L   Chloride 99  96 - 112 mEq/L   CO2 24  19 - 32 mEq/L   Glucose, Bld 67 (*) 70 - 99 mg/dL   BUN 10  6 - 23 mg/dL   Creatinine, Ser 0.62  0.50 - 1.10 mg/dL   Calcium 7.7 (*) 8.4 - 10.5 mg/dL   GFR calc non Af Amer 85 (*) >90 mL/min   GFR calc Af Amer >90  >90 mL/min   Comment: (NOTE)     The eGFR has been calculated using the CKD EPI equation.     This calculation has not been validated in all clinical situations.     eGFR's persistently <90 mL/min signify possible Chronic Kidney     Disease.   Anion gap 15  5 - 15  MAGNESIUM     Status: None   Collection Time    07/01/14  4:10 AM      Result Value Ref Range   Magnesium 2.0  1.5 - 2.5 mg/dL  GLUCOSE, CAPILLARY     Status: Abnormal   Collection Time    07/01/14 11:52 AM      Result Value Ref Range   Glucose-Capillary 109 (*) 70 - 99 mg/dL  CBC     Status: Abnormal   Collection Time    07/02/14  4:00 AM      Result Value Ref Range   WBC 7.0  4.0 - 10.5 K/uL   RBC 2.64 (*) 3.87 - 5.11 MIL/uL   Hemoglobin 8.6 (*) 12.0 - 15.0 g/dL   HCT 26.5 (*) 36.0 - 46.0 %   MCV 100.4 (*) 78.0 - 100.0 fL   MCH 32.6  26.0 - 34.0 pg   MCHC 32.5  30.0 - 36.0 g/dL   RDW 17.6 (*) 11.5 - 15.5 %   Platelets 113 (*) 150 - 400 K/uL   Comment: CONSISTENT WITH PREVIOUS RESULT  BASIC METABOLIC PANEL     Status: Abnormal   Collection Time    07/02/14  4:00 AM      Result Value Ref Range   Sodium 143  137 - 147 mEq/L   Potassium 4.0  3.7 - 5.3 mEq/L   Comment: DELTA CHECK NOTED     REPEATED TO VERIFY   Chloride 101  96 - 112 mEq/L   CO2 24  19 -  32 mEq/L   Glucose, Bld 71  70 - 99 mg/dL   BUN 10  6 - 23 mg/dL   Creatinine, Ser 0.63  0.50 - 1.10 mg/dL   Calcium 8.2 (*)  8.4 - 10.5 mg/dL   GFR calc non Af Amer 85 (*) >90 mL/min   GFR calc Af Amer >90  >90 mL/min   Comment: (NOTE)     The eGFR has been calculated using the CKD EPI equation.     This calculation has not been validated in all clinical situations.     eGFR's persistently <90 mL/min signify possible Chronic Kidney     Disease.   Anion gap 18 (*) 5 - 15    Imaging: Dg Chest 1 View  06/30/2014   CLINICAL DATA:  Status post right-sided thoracentesis.  EXAM: CHEST - 1 VIEW  COMPARISON:  Portable chest x-ray of earlier today  FINDINGS: The right lung is better aerated. The volume of pleural fluid has decreased. No postprocedure pneumothorax is evident. The left lung is well-expanded and grossly clear.  IMPRESSION: There is no postprocedure complication following right-sided thoracentesis. There is improved aeration of the right lung.   Electronically Signed   By: David  Martinique   On: 06/30/2014 14:53   Dg Chest Port 1 View  06/30/2014   CLINICAL DATA:  Reassess right pleural effusion.  EXAM: PORTABLE CHEST - 1 VIEW  COMPARISON:  Portable chest x-ray of June 28, 2014  FINDINGS: The volume of pleural fluid on the right has increased since yesterday's study. Only a small portion of the upper lobe remains aerated. There r is soft tissue fullness in the right hilar region. The left lung is well-expanded. The interstitial markings are more prominent today. There is no definite pleural effusion. The pulmonary vascularity is indistinct. The power port catheter tip overlies the midportion of the SVC. The bony thorax exhibits no acute abnormality.  IMPRESSION: Increased volume of the right pleural effusion since yesterday's study. Mild interstitial edema has developed on the left.   Electronically Signed   By: David  Martinique   On: 06/30/2014 11:30   US Thoracentesis Asp Pleural Space W/img  Guide  06/30/2014   CLINICAL DATA:  Multiple myeloma. Enlarging right pleural effusion. Respiratory difficulty.  TECHNIQUE: THORACENTESIS WITH ULTRASOUND  Technique: The procedure, risks (including but not limited to bleeding, infection, organ damage ), benefits, and alternatives were explained to the patient. Questions regarding the procedure were encouraged and answered. The patient understands and consents to the procedure. Survey ultrasound of the right hemithorax was performed and an appropriate skin entry site was localized. Site was marked, prepped with Betadine, draped in usual sterile fashion, infiltrated locally with 1% lidocaine.  The 5-French Yueh sheath needle was advanced into the pleural space. Blood tinged fluid returned. 1.15 L was removed. A sample was sent for the requested laboratory studies. Post procedure imaging shows minimal residual fluid. The patient tolerated procedure well, with no immediate complication.  IMPRESSION: Technically successful ultrasound-guided right thoracentesis. Follow-up chest radiograph pending.   Electronically Signed   By: Arne Cleveland M.D.   On: 06/30/2014 14:27    Assessment:  Principal Problem:   Atrial fibrillation with RVR Active Problems:   Essential hypertension   Multiple myeloma   Hyperlipidemia   History of atrial fibrillation   Pleural effusion   URI, acute   Plan:  Appears euvolemic.  Telemetry review shows sinus rhythm will less frequent PAC;s  Continue cardizem 180 mg daily.  Less dyspnic post right thoracentesis  Consider IS  Some atelectasis right base Cytology pending.  Cultures negative Plan per primary service Time Spent Directly with Patient:  15 minutes  Length of Stay:  LOS: 4 days  Jenkins Rouge 07/02/2014, 7:34 AM

## 2014-07-02 NOTE — Progress Notes (Signed)
Pt refusing to walk this AM.  Education and rationale provided and pt verbalizes understanding.  States I will walk after breakfast

## 2014-07-02 NOTE — Evaluation (Signed)
Physical Therapy Evaluation Patient Details Name: [name] MRN: [id number] DOB: [birth year] Today's Date: 07/02/2014   History of Present Illness  78 year old female with multiple myeloma, osteopenia, hypertension, anxiety, normocytic anemia presented to [facility] long ED with main concern of generalized weakness, palpitations, exertional shortness of breath and was found to be in atrial fibrillation with heart rate and 150s. Pt s/p right thoracentesis on 10/3  Clinical Impression  Pt currently with functional limitations due to the deficits listed below (see PT Problem List).  Pt will benefit from skilled PT to increase their independence and safety with mobility to allow discharge to the venue listed below.  Pt pleased with ambulation today and plans to improve mobility with each session with d/c plans for home with spouse.     Follow Up Recommendations Home health PT    Equipment Recommendations  None recommended by PT    Recommendations for Other Services       Precautions / Restrictions Precautions Precautions: Fall      Mobility  Bed Mobility Overal bed mobility: Needs Assistance Bed Mobility: Supine to Sit;Sit to Supine     Supine to sit: Min assist;HOB elevated Sit to supine: Supervision   General bed mobility comments: assist for scooting to EOB using bed pad  Transfers Overall transfer level: Needs assistance Equipment used: Rolling walker (2 wheeled) Transfers: Sit to/from Stand Sit to Stand: Min guard         General transfer comment: verbal cues for hand placement, min/guard for safety, pt wished to attempt without physical assist and observed posterior lean of legs against bed to stabilize  Ambulation/Gait Ambulation/Gait assistance: Min guard Ambulation Distance (Feet): 80 Feet Assistive device: Rolling walker (2 wheeled) Gait Pattern/deviations: Step-through pattern;Decreased stride length Gait velocity: decr   General Gait Details: pt required  a couple short standing rest breaks, maintained [place] during mobility  Stairs            Wheelchair Mobility    Modified Rankin (Stroke Patients Only)       Balance                                             Pertinent Vitals/Pain Pain Assessment: No/denies pain    Home Living Family/patient expects to be discharged to:: Private residence Living Arrangements: Spouse/significant other   Type of Home: Independent living facility Home Access: Elevator;Level entry     Home Layout: One level Home Equipment: Environmental consultant - 2 wheels;Wheelchair - manual;Bedside commode      Prior Function Level of Independence: Needs assistance   Gait / Transfers Assistance Needed: reports not typically using assistive device however moves around house holding furniture and walls           Hand Dominance        Extremity/Trunk Assessment               Lower Extremity Assessment: Generalized weakness         Communication   Communication: No difficulties  Cognition Arousal/Alertness: Awake/alert Behavior During Therapy: WFL for tasks assessed/performed Overall Cognitive Status: Within Functional Limits for tasks assessed                      General Comments      Exercises        Assessment/Plan    PT Assessment Patient needs continued  PT services  PT Diagnosis Difficulty walking;Generalized weakness   PT Problem List Decreased strength;Decreased activity tolerance;Decreased mobility;Decreased knowledge of use of DME  PT Treatment Interventions DME instruction;Gait training;Functional mobility training;Patient/family education;Therapeutic activities;Therapeutic exercise   PT Goals (Current goals can be found in the Care Plan section) Acute Rehab PT Goals PT Goal Formulation: With patient/family Time For Goal Achievement: 07/09/14 Potential to Achieve Goals: Good    Frequency Min 3X/week   Barriers to discharge         Co-evaluation               End of Session Equipment Utilized During Treatment: Gait belt;Oxygen Activity Tolerance: Patient tolerated treatment well Patient left: in bed;with call bell/phone within reach;with family/visitor present           Time: 6701-4103 PT Time Calculation (min): 19 min   Charges:   PT Evaluation $Initial PT Evaluation Tier I: 1 Procedure PT Treatments $Gait Training: 8-22 mins   PT G Codes:          Jermal Dismuke,KATHrine E 07/02/2014, 11:54 AM Carmelia Bake, PT, DPT 07/02/2014 Pager: [phone]

## 2014-07-02 NOTE — ED Provider Notes (Signed)
Medical screening examination/treatment/procedure(s) were conducted as a shared visit with non-physician practitioner(s) and myself.  I personally evaluated the patient during the encounter.   EKG Interpretation None       Leota Jacobsen, MD 07/02/14 [phone]

## 2014-07-02 NOTE — Progress Notes (Signed)
Patient ID: [name], female   DOB: 1936-08-10, 78 y.o.   MRN: [id number]  [facility] PROGRESS NOTE  [name] YWV:[id number] DOB: 1936/07/07 DOA: 06/28/2014 PCP: [name], [name]  Brief narrative:  Patient is 78 year old female with multiple myeloma, presented to [facility] long emergency department with main concern of generalized weakness, palpitations, exertional shortness of breath, productive cough of yellow sputum, subjective fevers and chills, symptoms progressively worsening over the past 2 days. In emergency department she was found to be in atrial fibrillation with heart rate and 150s. She was started on IV Cardizem bolus with no response and has subsequently required Cardizem drip. Hospitalist asked to admit for further evaluation. Patient denies chest pain, no abdominal or urinary concerns.   Assessment and Plan:   Principal Problem:  Atrial fibrillation with RVR  - Patient admitted to step down unit due to requirement of Cardizem drip  - Heart rate in 80s to 90s this morning, in sinus rhythm with occasional ectopic beats  - continue Cardizem per cardiology rec's  Active Problems:  Acute respiratory failure with hypoxia  - Appears to be multifactorial and secondary to acute onset of atrial fibrillation with RVR, R pleural effusion, ? PNA based on patient's presenting symptoms  - Atrial fibrillation treated with Cardizem as noted above  - Continue Zithromax day # 5 for treatment of ? PNA, sputum analysis, urine Legionella and strep pneumo negative to date  - Repeat x-ray 10/03 with worsening right-sided pleural effusion, requested IR consult for therapeutic and diagnostic thoracentesis  - pt is now s/p right side thoracentesis, post op day #2, 1/1 L removed and pt clinically stable this MA  Questionable community-acquired pneumonia  - No clear pathogen isolated to this point, sputum culture, urine Legionella and strep pneumo negative to date  - Continue empiric  Zithromax day #5 Right-sided pleural effusion  - Certainly worrisome for malignant process given known multiple myeloma, underlying diastolic congestive heart failure  - Worsening on chest x-ray 06/30/2014 compared to chest x-ray on admission  - s/p thoracentesis, right side, 1.1 L removed, follow up on studies  Acute on chronic diastolic congestive heart failure stage I  - Weight trending down since admission: 147 pounds --> 145 pounds --> 144 lbs this morning and unchanged over the past 24 hours  - Mild interstitial edema also noted on the left this morning on chest x-ray  - continue Lasix  - 2-D [facility] 06/28/2014 notable for normal EF 85-46%, grade 1 diastolic dysfunction  Multiple myeloma  - Appreciate Dr.Gorsuch assistance  Hyperlipidemia  - Stable, continue statin  Severe protein calorie malnutrition  - Secondary to acute on chronic illnesses outlined above  - Advance diet as patient able to tolerate and add nutritional supplements  Acute on chronic blood loss anemia  - Slight drop in hemoglobin since admission  - No signs of active bleeding, repeat CBC in the morning  Essential hypertension  - Reasonable inpatient control  Hypokalemia  - supplemented and WNL this AM  DVT prophylaxis  Heparin SQ while pt is in hospital  Code Status: DNR  Family Communication: Pt and her husband at bedside  Disposition Plan: Remains inpatient   IV Access:   Peripheral IV Procedures and diagnostic studies:   CXR 06/28/2014 Enlarging right-sided pleural effusion. Chronic changes.  CXR 06/30/2014 Increased volume of the right pleural effusion since yesterday's study. Mild interstitial edema has developed on the left. Medical Consultants:   Cardiology  IR for thoracentesis  Other Consultants:  None  Anti-Infectives:   Zithromax 10/01 -->  Kendra Ramsay, [name]  [facility] Pager [phone]  If 7PM-7AM, please contact night-coverage [email or web address] Password [email or web address] 07/02/2014, 7:19 AM   LOS: 4  days   HPI/Subjective: No events overnight.   Objective: Filed Vitals:   07/01/14 1343 07/01/14 2153 07/02/14 0355 07/02/14 0500  BP: 126/62 119/68 100/78   Pulse: 94 92 85   Temp: 97.6 F (36.4 C) 98.5 F (36.9 C) 98.5 F (36.9 C)   TempSrc: Axillary Oral Oral   Resp: $Remo'20 20 20   'RudvI$ Height:      Weight:    65.409 kg (144 lb 3.2 oz)  SpO2: 100% 96% 96%     Intake/Output Summary (Last 24 hours) at 07/02/14 0719 Last data filed at 07/02/14 0600  Gross per 24 hour  Intake   2250 ml  Output    500 ml  Net   1750 ml    Exam:   General:  Pt is alert, follows commands appropriately, not in acute distress  Cardiovascular: Regular rate and rhythm, no rubs, no gallops  Respiratory: Rales on right side but improved over the past 24 hours   Abdomen: Soft, non tender, non distended, bowel sounds present, no guarding  Extremities: No edema, pulses DP and PT palpable bilaterally  Neuro: Grossly nonfocal  Data Reviewed: Basic Metabolic Panel:  Recent Labs Lab 06/28/14 1352 06/28/14 2100 06/29/14 0230 06/29/14 0845 07/01/14 0410 07/02/14 0400  NA 146*  --  139 144 138 143  K 3.3*  --  3.1* 3.5* 3.2* 4.0  CL  --   --  98 101 99 101  CO2 20*  --  $R'20 20 24 24  'at$ GLUCOSE 99  --  96 95 67* 71  BUN 11.0  --  $R'11 11 10 10  'ND$ CREATININE 0.8 0.75 0.76 0.74 0.62 0.63  CALCIUM 10.0  --  9.2 8.9 7.7* 8.2*  MG  --   --   --   --  2.0  --    Liver Function Tests:  Recent Labs Lab 06/28/14 1352  AST 12  ALT 11  ALKPHOS 57  BILITOT 0.82  PROT 5.8*  ALBUMIN 3.0*   CBC:  Recent Labs Lab 06/28/14 1352 06/28/14 2100 06/29/14 0230 06/30/14 0425 07/01/14 0410 07/02/14 0400  WBC 9.2 8.7 9.4 10.0 7.2 7.0  NEUTROABS 6.1  --   --   --   --   --   HGB 11.4* 10.1* 9.9* 9.9* 8.9* 8.6*  HCT 36.0 31.2* 30.4* 30.9* 27.6* 26.5*  MCV 98.2 98.1 98.1 98.7 98.2 100.4*  PLT 193 155 142* 152 120* 113*   Cardiac Enzymes:  Recent Labs Lab 06/28/14 1521 06/28/14 2100 06/29/14 0230  06/29/14 0845  CKTOTAL 18  --   --   --   CKMB 2.4  --   --   --   TROPONINI 0.08* <0.30 <0.30 <0.30   CBG:  Recent Labs Lab 07/01/14 1152  GLUCAP 109*    Recent Results (from the past 240 hour(s))  MRSA PCR SCREENING     Status: None   Collection Time    06/29/14 12:08 AM      Result Value Ref Range Status   MRSA by PCR NEGATIVE  NEGATIVE Final   Comment:            The GeneXpert MRSA Assay (FDA     approved for NASAL specimens     only), is one component of a  comprehensive MRSA colonization     surveillance program. It is not     intended to diagnose MRSA     infection nor to guide or     monitor treatment for     MRSA infections.  BODY FLUID CULTURE     Status: None   Collection Time    06/30/14  2:01 PM      Result Value Ref Range Status   Specimen Description PLEURAL RIGHT   Final   Special Requests NONE   Final   Gram Stain     Final   Value: ABUNDANT WBC PRESENT, PREDOMINANTLY MONONUCLEAR     NO ORGANISMS SEEN     Performed at [facility]   Culture     Final   Value: NO GROWTH 1 DAY     Performed at [facility]   Report Status PENDING   Incomplete     Scheduled Meds: . acyclovir  400 mg Oral Daily  . aspirin EC  81 mg Oral Daily  . atorvastatin  20 mg Oral q1800  . azithromycin  500 mg Intravenous Q24H  . diltiazem  180 mg Oral Daily  . feeding supplement (ENSURE)  1 Container Oral TID BM  . furosemide  20 mg Oral Daily  . guaiFENesin  600 mg Oral BID  . heparin  5,000 Units Subcutaneous 3 times per day  . magnesium hydroxide  30 mL Oral BID  . multivitamin with minerals  1 tablet Oral Daily  . senna  1 tablet Oral BID  . sodium chloride  3 mL Intravenous Q12H   Continuous Infusions: . sodium chloride 10 mL/hr at 07/01/14 0123

## 2014-07-02 NOTE — Progress Notes (Signed)
BLESSING ZAUCHA   DOB:July 03, 1936   HC#:[id number]   DVV#:[id number]  I have seen the patient, examined her and edited the notes as follows  Subjective: Patient seen and examined. Afebrile. She is still feeling weak. She is back to normal sinus rhythm. Denies chest pain or palpitations. She reports improving shortness of breath after right thoracentesis on 10/3. Denies cough. Denies nausea or vomiting. Appetite decreased. No constipation or urinary complaints. No confusion is reported. She has not ambulated much due to weakness.   Scheduled Meds: . acyclovir  400 mg Oral Daily  . aspirin EC  81 mg Oral Daily  . atorvastatin  20 mg Oral q1800  . azithromycin  500 mg Intravenous Q24H  . diltiazem  180 mg Oral Daily  . feeding supplement (ENSURE)  1 Container Oral TID BM  . furosemide  20 mg Oral Daily  . guaiFENesin  600 mg Oral BID  . heparin  5,000 Units Subcutaneous 3 times per day  . magnesium hydroxide  30 mL Oral BID  . multivitamin with minerals  1 tablet Oral Daily  . senna  1 tablet Oral BID  . sodium chloride  3 mL Intravenous Q12H   Continuous Infusions: . sodium chloride 10 mL/hr at 07/01/14 0123   PRN Meds:acetaminophen, acetaminophen, ALPRAZolam, alum & mag hydroxide-simeth, bisacodyl, HYDROcodone-homatropine, lactose free nutrition, ondansetron (ZOFRAN) IV, ondansetron, oxyCODONE, sodium chloride   Objective:  Filed Vitals:   07/02/14 0355  BP: 100/78  Pulse: 85  Temp: 98.5 F (36.9 C)  Resp: 20      Intake/Output Summary (Last 24 hours) at 07/02/14 0755 Last data filed at 07/02/14 0600  Gross per 24 hour  Intake   2250 ml  Output    500 ml  Net   1750 ml    ECOG PERFORMANCE STATUS: 2-3  GENERAL:alert, no distress and ill appearing. SKIN: skin color, texture, turgor are normal, no rashes or significant lesions, with some areas of ecchymoses at the venipuncture site. Alopecia noted. EYES: normal, Conjunctiva are pink and non-injected, sclera clear   OROPHARYNX:no exudate, no erythema and lips, buccal mucosa, and tongue normal  NECK: supple, thyroid normal size, non-tender, without nodularity  LYMPH: no palpable lymphadenopathy in the cervical, axillary or inguinal  LUNGS: Reduced breath sounds on the right lung base. Normal breathing effort.  HEART: regular rate and rhythm.  No leg edema.  ABDOMEN: abdomen soft, non-tender and normal bowel sounds  Musculoskeletal:no cyanosis of digits and no clubbing  NEURO: alert & oriented x 3 with fluent speech, no focal motor/sensory deficits    CBG (last 3)   Recent Labs  07/01/14 1152  GLUCAP 109*     Labs:   Recent Labs Lab 06/28/14 1352 06/28/14 2100 06/29/14 0230 06/30/14 0425 07/01/14 0410 07/02/14 0400  WBC 9.2 8.7 9.4 10.0 7.2 7.0  HGB 11.4* 10.1* 9.9* 9.9* 8.9* 8.6*  HCT 36.0 31.2* 30.4* 30.9* 27.6* 26.5*  PLT 193 155 142* 152 120* 113*  MCV 98.2 98.1 98.1 98.7 98.2 100.4*  MCH 31.2 31.8 31.9 31.6 31.7 32.6  MCHC 31.7 32.4 32.6 32.0 32.2 32.5  RDW 18.0* 17.5* 17.8* 17.8* 17.4* 17.6*  LYMPHSABS 1.1  --   --   --   --   --   MONOABS 1.1*  --   --   --   --   --   EOSABS 0.8*  --   --   --   --   --   BASOSABS 0.1  --   --   --   --   --  Chemistries:    Recent Labs Lab 06/28/14 1352 06/28/14 2100 06/29/14 0230 06/29/14 0845 07/01/14 0410 07/02/14 0400  NA 146*  --  139 144 138 143  K 3.3*  --  3.1* 3.5* 3.2* 4.0  CL  --   --  98 101 99 101  CO2 20*  --  $R'20 20 24 24  'fs$ GLUCOSE 99  --  96 95 67* 71  BUN 11.0  --  $R'11 11 10 10  'dA$ CREATININE 0.8 0.75 0.76 0.74 0.62 0.63  CALCIUM 10.0  --  9.2 8.9 7.7* 8.2*  MG  --   --   --   --  2.0  --   AST 12  --   --   --   --   --   ALT 11  --   --   --   --   --   ALKPHOS 57  --   --   --   --   --   BILITOT 0.82  --   --   --   --   --     GFR Estimated Creatinine Clearance: 56 ml/min (by C-G formula based on Cr of 0.63).  Liver Function Tests:  Recent Labs Lab 06/28/14 1352  AST 12  ALT 11  ALKPHOS  57  BILITOT 0.82  PROT 5.8*  ALBUMIN 3.0*   Coagulation profile  Recent Labs Lab 06/28/14 1521  INR 1.20    Cardiac Enzymes:  Recent Labs Lab 06/28/14 1521 06/28/14 2100 06/29/14 0230 06/29/14 0845  CKTOTAL 18  --   --   --   CKMB 2.4  --   --   --   TROPONINI 0.08* <0.30 <0.30 <0.30    Recent Results (from the past 240 hour(s))  MRSA PCR SCREENING     Status: None   Collection Time    06/29/14 12:08 AM      Result Value Ref Range Status   MRSA by PCR NEGATIVE  NEGATIVE Final   Comment:            The GeneXpert MRSA Assay (FDA     approved for NASAL specimens     only), is one component of a     comprehensive MRSA colonization     surveillance program. It is not     intended to diagnose MRSA     infection nor to guide or     monitor treatment for     MRSA infections.  BODY FLUID CULTURE     Status: None   Collection Time    06/30/14  2:01 PM      Result Value Ref Range Status   Specimen Description PLEURAL RIGHT   Final   Special Requests NONE   Final   Gram Stain     Final   Value: ABUNDANT WBC PRESENT, PREDOMINANTLY MONONUCLEAR     NO ORGANISMS SEEN     Performed at [facility]   Culture     Final   Value: NO GROWTH 1 DAY     Performed at [facility]   Report Status PENDING   Incomplete       Imaging Studies:  Dg Chest 1 View  06/30/2014   CLINICAL DATA:  Status post right-sided thoracentesis.  EXAM: CHEST - 1 VIEW  COMPARISON:  Portable chest x-ray of earlier today  FINDINGS: The right lung is better aerated. The volume of pleural fluid has decreased. No postprocedure pneumothorax is evident. The  left lung is well-expanded and grossly clear.  IMPRESSION: There is no postprocedure complication following right-sided thoracentesis. There is improved aeration of the right lung.   Electronically Signed   By: David  Martinique   On: 06/30/2014 14:53   Dg Chest Port 1 View  07/02/2014   CLINICAL DATA:  Shortness of breath.  EXAM: PORTABLE  CHEST - 1 VIEW  COMPARISON:  06/30/2014.  Chest CT 04/19/2014.  FINDINGS: A power port catheter is in stable position. Mediastinal structures are normal. Right lower lobe atelectasis. Interval slight increase in right pleural effusion and/or pleural based mass. No pneumothorax. No acute bony abnormality.  IMPRESSION: 1. There is persistent right lower lobe atelectasis. 2. Interim slight progression of right pleural effusion and/or pleural mass.   Electronically Signed   By: Marcello Moores  Register   On: 07/02/2014 07:37   Dg Chest Port 1 View  06/30/2014   CLINICAL DATA:  Reassess right pleural effusion.  EXAM: PORTABLE CHEST - 1 VIEW  COMPARISON:  Portable chest x-ray of June 28, 2014  FINDINGS: The volume of pleural fluid on the right has increased since yesterday's study. Only a small portion of the upper lobe remains aerated. There r is soft tissue fullness in the right hilar region. The left lung is well-expanded. The interstitial markings are more prominent today. There is no definite pleural effusion. The pulmonary vascularity is indistinct. The power port catheter tip overlies the midportion of the SVC. The bony thorax exhibits no acute abnormality.  IMPRESSION: Increased volume of the right pleural effusion since yesterday's study. Mild interstitial edema has developed on the left.   Electronically Signed   By: David  Martinique   On: 06/30/2014 11:30   US Thoracentesis Asp Pleural Space W/img Guide  06/30/2014   CLINICAL DATA:  Multiple myeloma. Enlarging right pleural effusion. Respiratory difficulty.  TECHNIQUE: THORACENTESIS WITH ULTRASOUND  Technique: The procedure, risks (including but not limited to bleeding, infection, organ damage ), benefits, and alternatives were explained to the patient. Questions regarding the procedure were encouraged and answered. The patient understands and consents to the procedure. Survey ultrasound of the right hemithorax was performed and an appropriate skin entry site was  localized. Site was marked, prepped with Betadine, draped in usual sterile fashion, infiltrated locally with 1% lidocaine.  The 5-French Yueh sheath needle was advanced into the pleural space. Blood tinged fluid returned. 1.15 L was removed. A sample was sent for the requested laboratory studies. Post procedure imaging shows minimal residual fluid. The patient tolerated procedure well, with no immediate complication.  IMPRESSION: Technically successful ultrasound-guided right thoracentesis. Follow-up chest radiograph pending.   Electronically Signed   By: Arne Cleveland M.D.   On: 06/30/2014 14:27    Assessment/Plan: 78 y.o.  Atrial fibrillation with RVR  This was likely triggered by recent infection. Clinically, she has cardiovascular instability.  A recent echo had shown normal ejection fraction, no diastolic dysfunction, thus has not been repeated at this time. Cardiac enzymes were negative  Placed on Cardizem drip, then switched to oral form, with conversion to sinus rythm Blood cultures negative. MRSA negative. She was being treated for bronchitis by her PCP.  The patient may need to be anticoagulated to reduce risk of stroke.  Multiple myeloma  Her treatment was placed on hold due to cardiac symptoms.   Anemia in neoplastic disease  This is likely anemia of chronic disease. The patient denies recent history of bleeding such as epistaxis, hematuria or hematochezia. She is asymptomatic from the  anemia. We will observe for now. She does not require transfusion now.   Thrombocytopenia Due to recent infection, dilution, polypharmacy, and blood in pleural fluid . Patient is on heparin. Will monitor closely. No transfusion required at this time as no other bleeding issues including hemoptysis, epistaxis, hematuria or hematochezia are reported.  Hypokalemia  This is due to poor oral intake.  The patient received replacement therapy by primary team, with resolution of hypokalemia as of  10/5.  Acute upper respiratory infection Right Pleural Effusion Chest x ray on admission showed enlarging right sided pleural effusion.  She underwent therapeutic right thoracentesis on 10/3, yielding 1.15 l of blood tinged fluid sample of this fluid was sent for cytology, with results pending. New chest x ray shows interim slight increase of this right pleural effusion. Continue to monitor. She is on azithromycin and guaifenesin  DVT prophylaxis On Heparin and mechanical devices  DNR Other medical issues as per admitting team   Discharge planning Once her weakness is improved and physical therapist felt she is safe to be discharged home, I think she can be discharged from my standpoint. I will make her return appointment next week.  Rondel Jumbo, PA-C 07/02/2014  7:55 AM

## 2014-07-02 NOTE — Evaluation (Signed)
Occupational Therapy Evaluation Patient Details Name: [name] MRN: [id number] DOB: [birth year] Today's Date: 07/02/2014    History of Present Illness 78 year old female with multiple myeloma, osteopenia, hypertension, anxiety, normocytic anemia presented to [facility] long ED with main concern of generalized weakness, palpitations, exertional shortness of breath and was found to be in atrial fibrillation with heart rate and 150s. Pt s/p right thoracentesis on 10/3   Clinical Impression   Pt overall at min assist level with ADL today. Husband present at end of session. Pt will benefit from continued OT services to progress ADL independence for return home with spouse.     Follow Up Recommendations  Supervision/Assistance - 24 hour;Home health OT    Equipment Recommendations  None recommended by OT    Recommendations for Other Services       Precautions / Restrictions Precautions Precautions: Fall      Mobility Bed Mobility Overal bed mobility: Needs Assistance Bed Mobility: Supine to Sit;Sit to Supine     Supine to sit: HOB elevated;Supervision Sit to supine: Supervision   General bed mobility comments: assist for scooting to EOB using bed pad  Transfers Overall transfer level: Needs assistance Equipment used: Rolling walker (2 wheeled) Transfers: Sit to/from Stand Sit to Stand: Min assist         General transfer comment: verbal cues hand placement. 2 attempts to come to standing.     Balance                                            ADL Overall ADL's : Needs assistance/impaired Eating/Feeding: Independent;Sitting   Grooming: Wash/dry hands;Set up;Sitting   Upper Body Bathing: Set up;Sitting   Lower Body Bathing: Minimal assistance;Sit to/from stand   Upper Body Dressing : Set up;Sitting   Lower Body Dressing: Minimal assistance;Sit to/from stand   Toilet Transfer: Minimal assistance;Ambulation;RW   Toileting- Clothing  Manipulation and Hygiene: Minimal assistance;Sit to/from stand         General ADL Comments: Pt required 2 attempt to come to standing and min assist to weight shift. She did well with crossing LEs up at EOB to don socks. She states she was alittle fatigued from her PT session earlier but states she feels better overall. Husband available to help PRN at home.      Vision                     Perception     Praxis      Pertinent Vitals/Pain Pain Assessment: No/denies pain     Hand Dominance     Extremity/Trunk Assessment Upper Extremity Assessment Upper Extremity Assessment: Generalized weakness          Communication Communication Communication: No difficulties   Cognition Arousal/Alertness: Awake/alert Behavior During Therapy: WFL for tasks assessed/performed Overall Cognitive Status: Within Functional Limits for tasks assessed                     General Comments       Exercises       Shoulder Instructions      Home Living Family/patient expects to be discharged to:: Private residence Living Arrangements: Spouse/significant other   Type of Home: Independent living facility Home Access: Elevator;Level entry     Home Layout: One level     Bathroom Shower/Tub: Teacher, early years/pre: Handicapped height  Home Equipment: [place] - 2 wheels;Wheelchair - [employer];Shower seat          Prior Functioning/Environment Level of Independence: Needs assistance  Gait / Transfers Assistance Needed: reports not typically using assistive device however moves around house holding furniture and walls ADL's / Homemaking Assistance Needed: husband helps with household tasks. she reports wearing O2 mostly at night.         OT Diagnosis: Generalized weakness   OT Problem List: Decreased strength;Decreased knowledge of use of DME or AE   OT Treatment/Interventions: Self-care/ADL training;Patient/family  education;Therapeutic activities;DME and/or AE instruction    OT Goals(Current goals can be found in the care plan section) Acute Rehab OT Goals Patient Stated Goal: home when ready OT Goal Formulation: With patient Time For Goal Achievement: 07/16/14 Potential to Achieve Goals: Good  OT Frequency: Min 2X/week   Barriers to D/C:            Co-evaluation              End of Session Equipment Utilized During Treatment: Rolling walker;Oxygen  Activity Tolerance: Patient tolerated treatment well Patient left: in bed;with call bell/phone within reach;with bed alarm set;with family/visitor present   Time: 4196-2229 OT Time Calculation (min): 27 min Charges:  OT General Charges $OT Visit: 1 Procedure OT Evaluation $Initial OT Evaluation Tier I: 1 Procedure OT Treatments $Self Care/Home Management : 8-22 mins $Therapeutic Activity: 8-22 mins G-Codes:    Jules Schick [phone] 07/02/2014, 12:46 PM

## 2014-07-03 ENCOUNTER — Inpatient Hospital Stay (HOSPITAL_COMMUNITY): Payer: Medicare Other

## 2014-07-03 LAB — BASIC METABOLIC PANEL
ANION GAP: 20 — AB (ref 5–15)
BUN: 11 mg/dL (ref 6–23)
CALCIUM: 8.1 mg/dL — AB (ref 8.4–10.5)
CO2: 23 mEq/L (ref 19–32)
Chloride: 98 mEq/L (ref 96–112)
Creatinine, Ser: 0.67 mg/dL (ref 0.50–1.10)
GFR calc non Af Amer: 83 mL/min — ABNORMAL LOW (ref >90)
GLUCOSE: 69 mg/dL — AB (ref 70–99)
POTASSIUM: 3.9 meq/L (ref 3.7–5.3)
Sodium: 141 mEq/L (ref 137–147)

## 2014-07-03 LAB — CBC
HCT: 29.6 % — ABNORMAL LOW (ref 36.0–46.0)
Hemoglobin: 9.4 g/dL — ABNORMAL LOW (ref 12.0–15.0)
MCH: 31.8 pg (ref 26.0–34.0)
MCHC: 31.8 g/dL (ref 30.0–36.0)
MCV: 100 fL (ref 78.0–100.0)
PLATELETS: 134 10*3/uL — AB (ref 150–400)
RBC: 2.96 MIL/uL — ABNORMAL LOW (ref 3.87–5.11)
RDW: 18 % — AB (ref 11.5–15.5)
WBC: 6.5 10*3/uL (ref 4.0–10.5)

## 2014-07-03 IMAGING — CT CT CHEST W/O CM
3 series · 17 of 30 positions shown, 19 images · non-contrast
Comparison: Chest x-ray dated 04/04/2012

CLINICAL DATA: Right lung mass.

CT CHEST WITHOUT CONTRAST
TECHNIQUE: Multidetector CT imaging of the chest was performed
following the standard protocol without IV contrast.

[Series 2: routine chest · axial · 0.70mm/px · z∈[-236,-66]mm · 6 of 52 slices shown, 8 images]
[im 9/52  mediastinal]
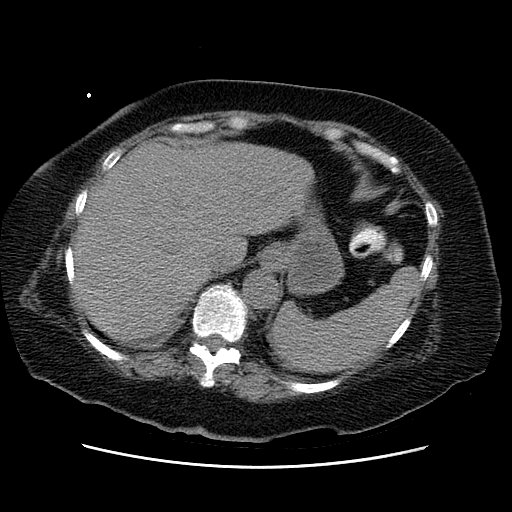
[im 9/52  lung]
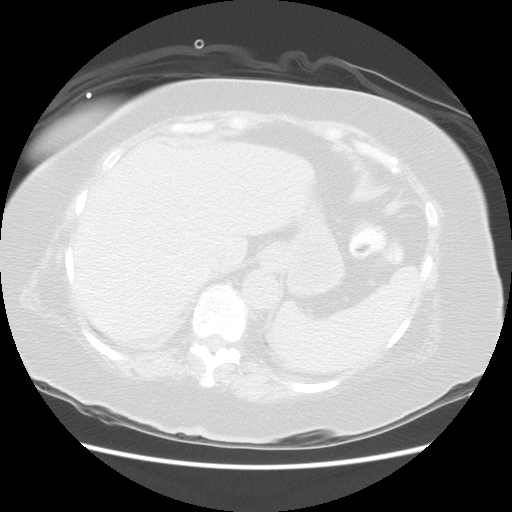
[im 18/52  lung]
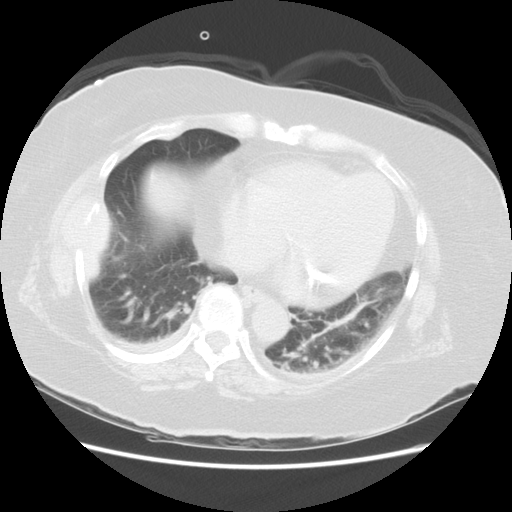
[im 26/52  lung]
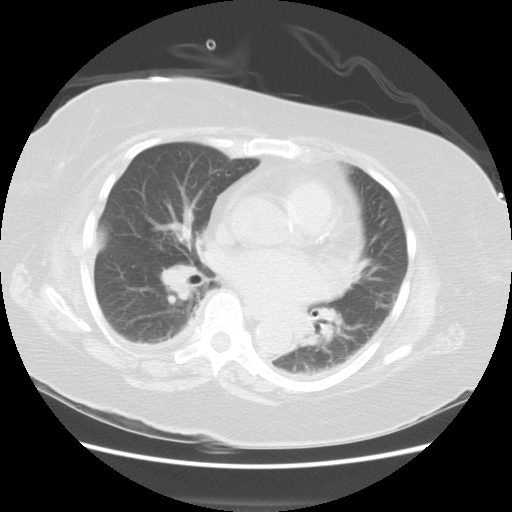
[im 30/52  lung]
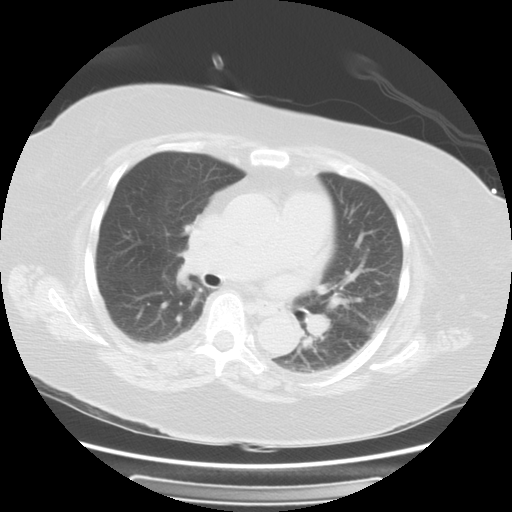
[im 35/52  mediastinal]
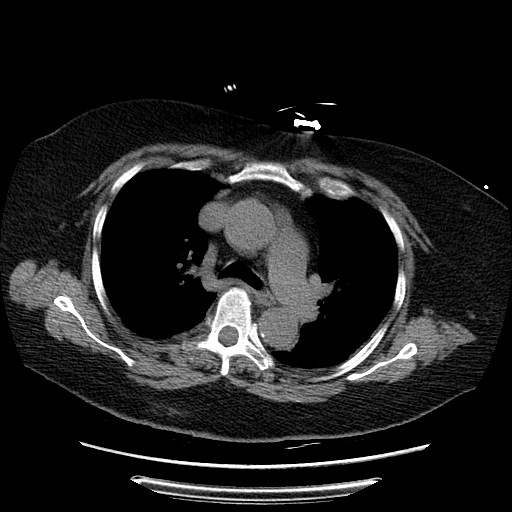
[im 35/52  lung]
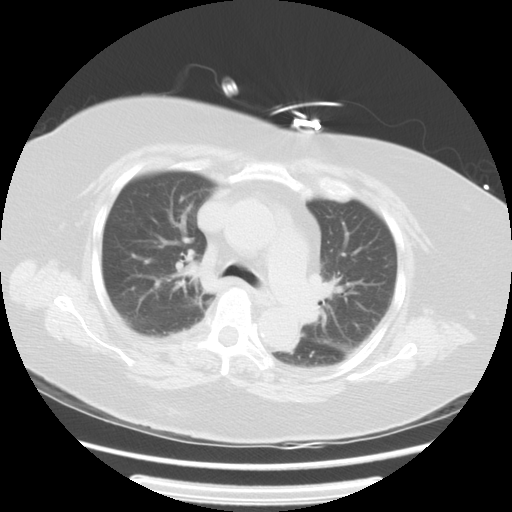
[im 43/52  lung]
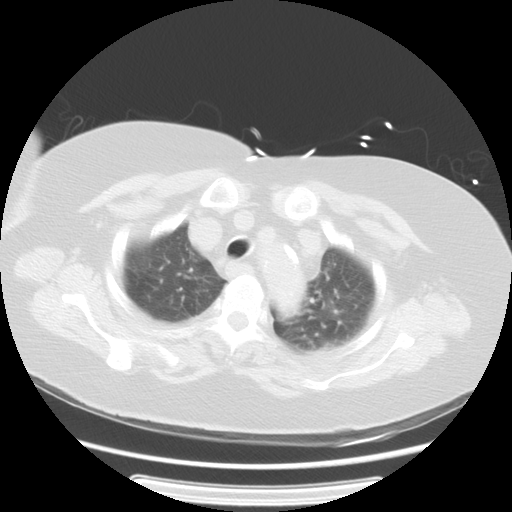

[Series 400: cor · coronal · 0.70mm/px · 3 of 76 slices shown]
[im 10/76  lung]
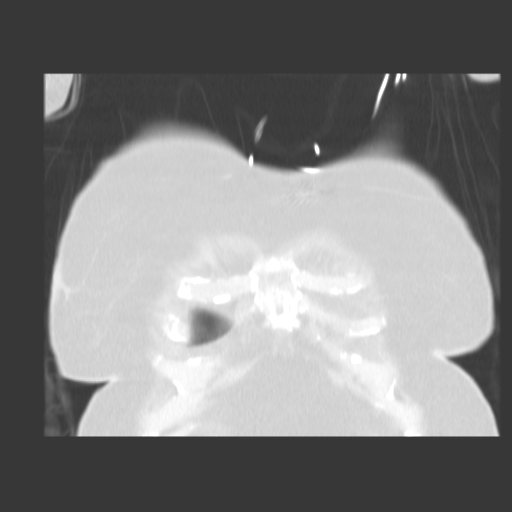
[im 19/76  lung]
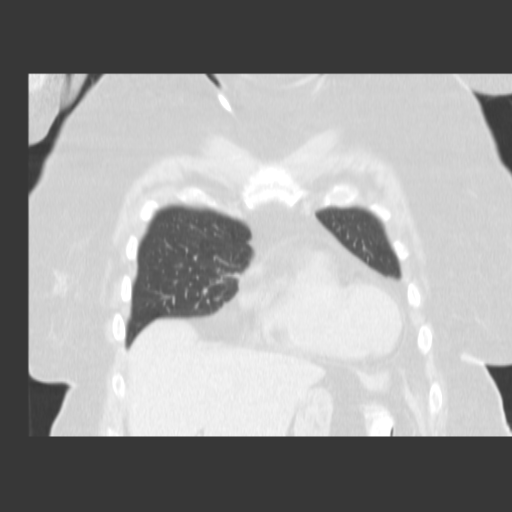
[im 29/76  lung]
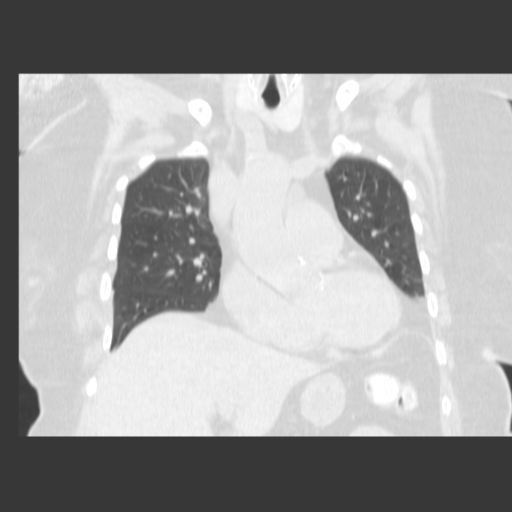

[Series 401: sag · sagittal · 0.70mm/px · 8 of 102 slices shown]
[im 10/102  lung]
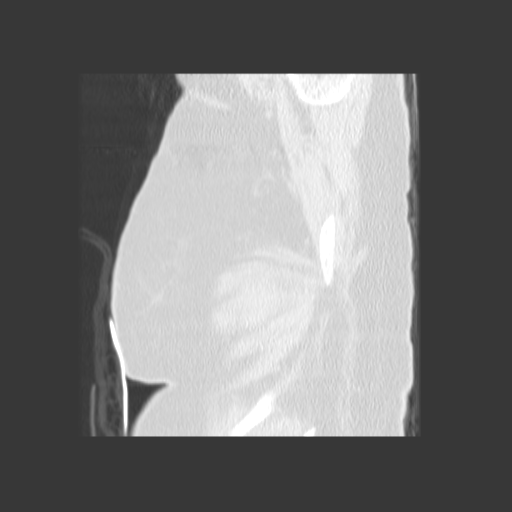
[im 28/102  lung]
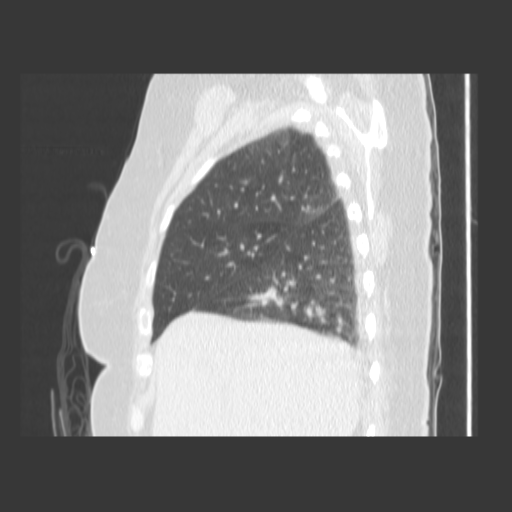
[im 37/102  lung]
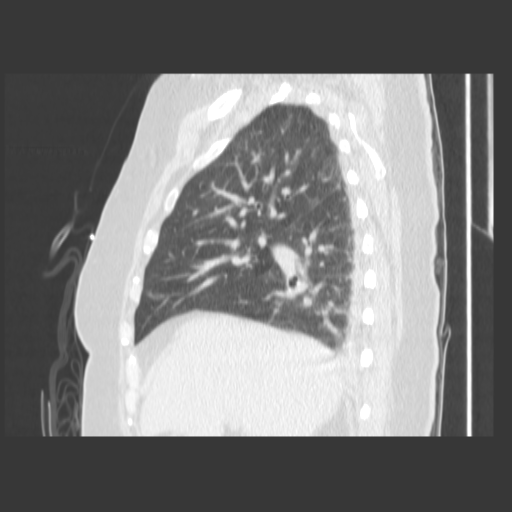
[im 46/102  lung]
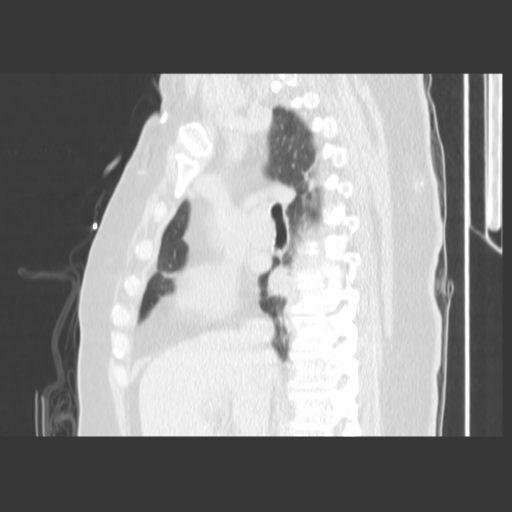
[im 56/102  lung]
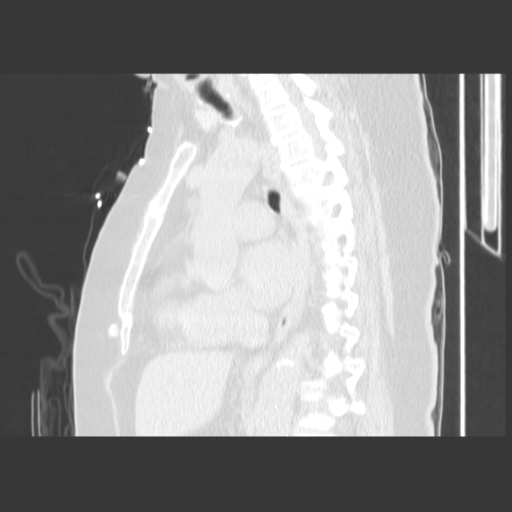
[im 65/102  lung]
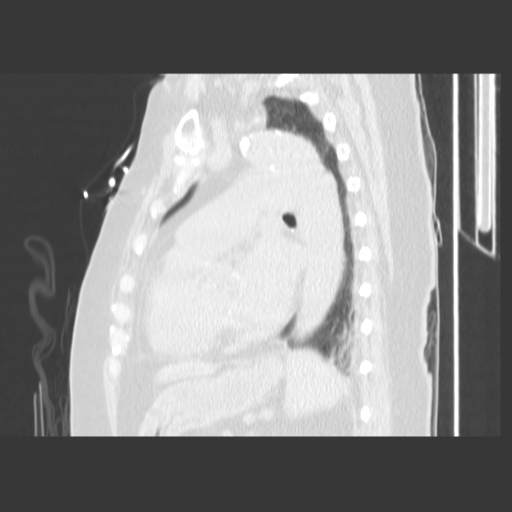
[im 74/102  lung]
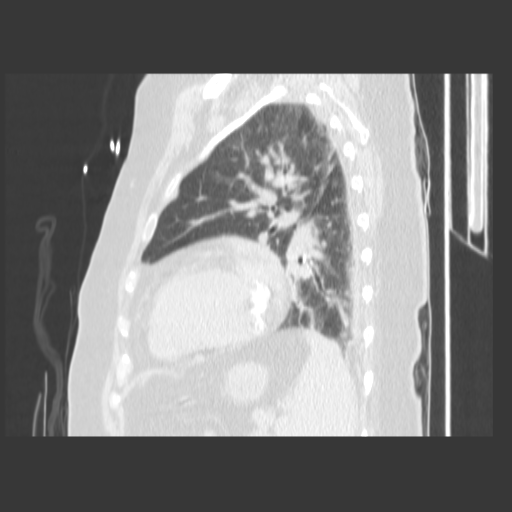
[im 92/102  lung]
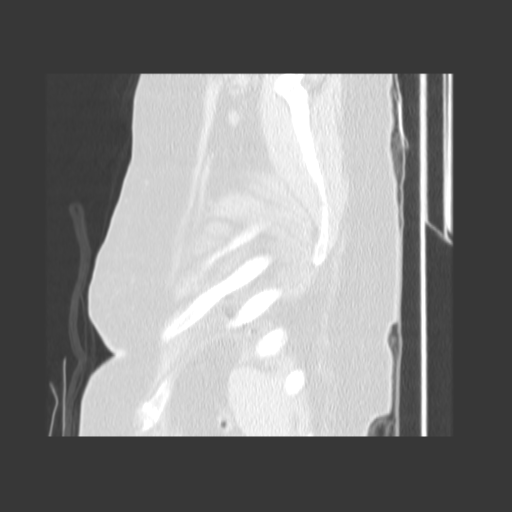

[17 of 30 positions shown; findings below may reference images not displayed]

FINDINGS: There is a 4.4 x 5.7 x 5.2 cm mass destroying the lateral
aspect of the right seventh rib.  It extends into the right
hemithorax and compresses the adjacent lung.

 There is no hilar or mediastinal adenopathy.  Minimal atelectasis
at the left lung base.  Tiny right effusion.

There are multiple subtle tiny lesions in the thoracic spine
consistent with the lesions seen on the lumbar MRI dated
04/04/2012.

The heart is at the upper limits of normal in size.  There is a
tiny hiatal hernia.  Visualized portion of the upper abdomen is
normal.
IMPRESSION: 1.  Numerous tiny lesions throughout the thoracic spine consistent
with multiple myeloma.
2.  Large soft tissue mass destroying the lateral aspect of the
right seventh rib, most likely a large lesion of multiple myeloma
(plasmocytoma).

## 2014-07-03 MED ORDER — DILTIAZEM HCL ER COATED BEADS 240 MG PO CP24: 240.0000 mg | ORAL_CAPSULE | Freq: Every day | ORAL | Status: AC

## 2014-07-03 MED ORDER — FUROSEMIDE 20 MG PO TABS: 20.0000 mg | ORAL_TABLET | Freq: Every day | ORAL | Status: AC

## 2014-07-03 MED ORDER — DILTIAZEM HCL ER COATED BEADS 240 MG PO CP24
240.0000 mg | ORAL_CAPSULE | Freq: Every day | ORAL | Status: DC
  Administered 2014-07-03 – 2014-07-05 (×3): 240 mg via ORAL
  Filled 2014-07-03 (×3): qty 1

## 2014-07-03 MED ORDER — AZITHROMYCIN 250 MG PO TABS: 250.0000 mg | ORAL_TABLET | Freq: Every day | ORAL | Status: DC

## 2014-07-03 NOTE — Progress Notes (Signed)
[name]   DOB:03-09-1936   NU#:[id number]   IHK#:[id number]  I have seen the patient, examined her and edited the notes as follows  Subjective: Patient seen and examined. Afebrile. feeling better this morning. She is back to normal sinus rhythm. Denies chest pain or palpitations.Denies worsening dyspnea. Denies cough. Denies nausea or vomiting. Appetite improving. No constipation or urinary complaints. No confusion is reported. she is making efforts to increase activity.   Scheduled Meds: . acyclovir  400 mg Oral Daily  . aspirin EC  81 mg Oral Daily  . atorvastatin  20 mg Oral q1800  . azithromycin  500 mg Intravenous Q24H  . diltiazem  180 mg Oral Daily  . feeding supplement (ENSURE)  1 Container Oral TID BM  . furosemide  20 mg Oral Daily  . guaiFENesin  600 mg Oral BID  . heparin  5,000 Units Subcutaneous 3 times per day  . magnesium hydroxide  30 mL Oral BID  . multivitamin with minerals  1 tablet Oral Daily  . senna  1 tablet Oral BID   Continuous Infusions: . sodium chloride 10 mL/hr at 07/01/14 0123   PRN Meds:acetaminophen, acetaminophen, ALPRAZolam, alum & mag hydroxide-simeth, bisacodyl, HYDROcodone-homatropine, lactose free nutrition, ondansetron (ZOFRAN) IV, ondansetron, oxyCODONE, sodium chloride   Objective:  Filed Vitals:   07/02/14 2156  BP: 130/73  Pulse: 104  Temp: 98.9 F (37.2 C)  Resp: 20      Intake/Output Summary (Last 24 hours) at 07/03/14 0706 Last data filed at 07/03/14 0536  Gross per 24 hour  Intake    840 ml  Output    725 ml  Net    115 ml    ECOG PERFORMANCE STATUS: 2-3  GENERAL:alert, no distress and ill appearing. SKIN: skin color, texture, turgor are normal, no rashes or significant lesions, with some areas of ecchymoses at the venipuncture site. Alopecia noted. EYES: normal, Conjunctiva are pink and non-injected, sclera clear  OROPHARYNX:no exudate, no erythema and lips, buccal mucosa, and tongue normal  NECK: supple, thyroid  normal size, non-tender, without nodularity  LYMPH: no palpable lymphadenopathy in the cervical, axillary or inguinal  LUNGS: Reduced breath sounds on the right lung base, slightly worse than prior day. Normal breathing effort.  HEART: regular rate and rhythm. Trace leg edema.  ABDOMEN: abdomen soft, non-tender and normal bowel sounds  Musculoskeletal:no cyanosis of digits and no clubbing  NEURO: alert & oriented x 3 with fluent speech, no focal motor/sensory deficits    CBG (last 3)   Recent Labs  07/01/14 1152  GLUCAP 109*     Labs:   Recent Labs Lab 06/28/14 1352  06/29/14 0230 06/30/14 0425 07/01/14 0410 07/02/14 0400 07/03/14 0500  WBC 9.2  < > 9.4 10.0 7.2 7.0 6.5  HGB 11.4*  < > 9.9* 9.9* 8.9* 8.6* 9.4*  HCT 36.0  < > 30.4* 30.9* 27.6* 26.5* 29.6*  PLT 193  < > 142* 152 120* 113* 134*  MCV 98.2  < > 98.1 98.7 98.2 100.4* 100.0  MCH 31.2  < > 31.9 31.6 31.7 32.6 31.8  MCHC 31.7  < > 32.6 32.0 32.2 32.5 31.8  RDW 18.0*  < > 17.8* 17.8* 17.4* 17.6* 18.0*  LYMPHSABS 1.1  --   --   --   --   --   --   MONOABS 1.1*  --   --   --   --   --   --   EOSABS 0.8*  --   --   --   --   --   --  BASOSABS 0.1  --   --   --   --   --   --   < > = values in this interval not displayed.   Chemistries:    Recent Labs Lab 06/28/14 1352  06/29/14 0230 06/29/14 0845 07/01/14 0410 07/02/14 0400 07/03/14 0500  NA 146*  --  139 144 138 143 141  K 3.3*  --  3.1* 3.5* 3.2* 4.0 3.9  CL  --   --  98 101 99 101 98  CO2 20*  --  _0 GLUCOSE 99  --  96 95 67* 71 69*  BUN 11.0  --  _1 CREATININE 0.8  < > 0.76 0.74 0.62 0.63 0.67  CALCIUM 10.0  --  9.2 8.9 7.7* 8.2* 8.1*  MG  --   --   --   --  2.0  --   --   AST 12  --   --   --   --   --   --   ALT 11  --   --   --   --   --   --   ALKPHOS 57  --   --   --   --   --   --   BILITOT 0.82  --   --   --   --   --   --   < > = values in this interval not displayed.  GFR Estimated Creatinine Clearance:  56 ml/min (by C-G formula based on Cr of 0.67).  Liver Function Tests:  Recent Labs Lab 06/28/14 1352  AST 12  ALT 11  ALKPHOS 57  BILITOT 0.82  PROT 5.8*  ALBUMIN 3.0*   Coagulation profile  Recent Labs Lab 06/28/14 1521  INR 1.20    Cardiac Enzymes:  Recent Labs Lab 06/28/14 1521 06/28/14 2100 06/29/14 0230 06/29/14 0845  CKTOTAL 18  --   --   --   CKMB 2.4  --   --   --   TROPONINI 0.08* <0.30 <0.30 <0.30       Imaging Studies:  Dg Chest Port 1 View  07/02/2014   CLINICAL DATA:  Shortness of breath.  EXAM: PORTABLE CHEST - 1 VIEW  COMPARISON:  06/30/2014.  Chest CT 04/19/2014.  FINDINGS: A power port catheter is in stable position. Mediastinal structures are normal. Right lower lobe atelectasis. Interval slight increase in right pleural effusion and/or pleural based mass. No pneumothorax. No acute bony abnormality.  IMPRESSION: 1. There is persistent right lower lobe atelectasis. 2. Interim slight progression of right pleural effusion and/or pleural mass.   Electronically Signed   By: Marcello Moores  Register   On: 07/02/2014 07:37    Assessment/Plan: 78 y.o.  Atrial fibrillation with RVR  This was likely triggered by recent infection. Clinically, she has cardiovascular instability.  A recent echo had shown normal ejection fraction, no diastolic dysfunction, thus has not been repeated at this time. Cardiac enzymes were negative  Placed on Cardizem drip, then switched to oral form, with conversion to sinus rhythm Blood cultures negative. MRSA negative. She was being treated for bronchitis by her PCP.  She is on aspirin therapy to reduce risk of stroke.  Multiple myeloma  Her treatment was placed on hold due to cardiac symptoms.    Anemia in neoplastic disease  This is likely anemia of chronic disease. The patient denies recent history of bleeding such as epistaxis, hematuria or  hematochezia. She is asymptomatic from the anemia. We will observe for now. She does not  require transfusion now.   Thrombocytopenia Due to recent infection, dilution, polypharmacy, and blood in pleural fluid. Patient is on heparin. Will monitor closely. No transfusion required at this time as no other bleeding issues including hemoptysis, epistaxis, hematuria or hematochezia are reported.  Hypokalemia  This is due to poor oral intake. The patient received replacement therapy by primary team, with resolution of hypokalemia as of 10/5.  Acute upper respiratory infection Right Pleural Effusion Chest x ray on admission showed enlarging right sided pleural effusion.  She underwent therapeutic right thoracentesis on 10/3, yielding 1.15 l of blood tinged fluid sample of this fluid was sent for cytology, with results pending. New chest x ray shows interim slight increase of this right pleural effusion. Continue to monitor. She is on azithromycin and guaifenesin  DVT prophylaxis On Heparin and mechanical devices  DNR Other medical issues as per admitting team   Discharge planning Once her weakness is improved and physical therapist felt she is safe to be discharged home, she can be discharged from my standpoint. Will make her return appointment next week.  Will sign off. Call if questions arise  Rondel Jumbo, PA-C 07/03/2014  7:06 AM Rosette Bellavance, MD 07/03/2014

## 2014-07-03 NOTE — Discharge Summary (Signed)
Physician Discharge Summary  [name] JYN:[id number] DOB: [birth year] DOA: 06/28/2014  PCP: [name], MD  Admit date: 06/28/2014 Discharge date: 07/04/2014  Recommendations for Outpatient Follow-up:  1. Pt will need to follow up with PCP in 2-3 weeks post discharge 2. Please obtain BMP to evaluate electrolytes and kidney function 3. Please also check CBC to evaluate Hg and Hct levels 4. Continue Zithromax for 5 more days post discharge 5. Please note that pt was started on Lasix 20 mg PO QD 6. Pt also started on Cardizem 240 mg PO QD per cardiology recommendations  7. Pt ready for d/c from oncology and cardiology standpoint 8. If CXR stable in AM, pt can be d/c   Discharge Diagnoses: Atrial fibrillation with RVR   Principal Problem:   Atrial fibrillation with RVR Active Problems:   Essential hypertension   Multiple myeloma   Hyperlipidemia   History of atrial fibrillation   Pleural effusion   URI, acute  Discharge Condition: Stable  Diet recommendation: Heart healthy diet discussed in details   Brief narrative:  Patient is 78 year old female with multiple myeloma, presented to [facility] long emergency department with main concern of generalized weakness, palpitations, exertional shortness of breath, productive cough of yellow sputum, subjective fevers and chills, symptoms progressively worsening over the past 2 days. In emergency department she was found to be in atrial fibrillation with heart rate and 150s. She was started on IV Cardizem bolus with no response and has subsequently required Cardizem drip. Hospitalist asked to admit for further evaluation. Patient denies chest pain, no abdominal or urinary concerns.   Assessment and Plan:   Principal Problem:  Atrial fibrillation with RVR  - Patient admitted to step down unit due to requirement of Cardizem drip  - Heart rate in 80s to 90s this morning, in sinus rhythm with occasional ectopic beats  - continue  Cardizem per cardiology rec's  Active Problems:  Acute respiratory failure with hypoxia  - Appears to be multifactorial and secondary to acute onset of atrial fibrillation with RVR, R pleural effusion, ? PNA based on patient's presenting symptoms  - Atrial fibrillation treated with Cardizem as noted above  - Continue Zithromax day # 6 for treatment of ? PNA, sputum analysis, urine Legionella and strep pneumo negative to date  - Repeat x-ray 10/03 with worsening right-sided pleural effusion, requested IR consult for therapeutic and diagnostic thoracentesis  - pt is now s/p right side thoracentesis, post op day #3, 1/1 L removed and pt clinically stable this AM - repeat CXR in AM and if stable, pt can be d/c Questionable community-acquired pneumonia  - No clear pathogen isolated to this point, sputum culture, urine Legionella and strep pneumo negative to date  - Continue empiric Zithromax day #6 Right-sided pleural effusion  - Certainly worrisome for malignant process given known multiple myeloma, underlying diastolic congestive heart failure  - Worsening on chest x-ray 06/30/2014 compared to chest x-ray on admission  - s/p thoracentesis, right side, 1.1 L removed, follow up on studies  Acute on chronic diastolic congestive heart failure stage I  - Weight trending down since admission: 147 pounds --> 145 pounds --> 144 lbs this morning and unchanged over the past 48 hours  - Mild interstitial edema also noted on the left this morning on chest x-ray  - continue Lasix  - 2-D [facility] 06/28/2014 notable for normal EF 86-57%, grade 1 diastolic dysfunction  Multiple myeloma  - Appreciate Dr.Gorsuch assistance  Hyperlipidemia  - Stable, continue statin  Severe protein calorie malnutrition  - Secondary to acute on chronic illnesses outlined above  - Advance diet as patient able to tolerate and add nutritional supplements  Acute on chronic blood loss anemia  - Slight drop in hemoglobin since admission   - No signs of active bleeding, repeat CBC in the morning  Essential hypertension  - Reasonable inpatient control  Hypokalemia  - supplemented and WNL this AM   Code Status: DNR  Family Communication: Pt and her husband at bedside  Disposition Plan: Possible d/c in AM if pt ambulating and CXR stable   IV Access:   Peripheral IV Procedures and diagnostic studies:   CXR 06/28/2014 Enlarging right-sided pleural effusion. Chronic changes.  CXR 06/30/2014 Increased volume of the right pleural effusion since yesterday's study. Mild interstitial edema has developed on the left. Medical Consultants:   Cardiology  IR for thoracentesis  Other Consultants:   None  Anti-Infectives:   Zithromax 10/01 --> continue for 5 more days post discharge   Discharge Exam: Filed Vitals:   07/03/14 1439  BP: 99/77  Pulse: 91  Temp: 98.8 F (37.1 C)  Resp: 18   Filed Vitals:   07/03/14 0911 07/03/14 0925 07/03/14 1043 07/03/14 1439  BP:  123/67 123/67 99/77  Pulse: 116 91  91  Temp:  98.6 F (37 C)  98.8 F (37.1 C)  TempSrc:  Oral  Oral  Resp:  17  18  Height:      Weight:      SpO2: 96% 98%  98%    General: Pt is alert, follows commands appropriately, not in acute distress Cardiovascular: Regular rate and rhythm, no rubs, no gallops Respiratory: Clear to auscultation bilaterally, rales at bases R>L Abdominal: Soft, non tender, non distended, bowel sounds +, no guarding Extremities: no edema, no cyanosis, pulses palpable bilaterally DP and PT Neuro: Grossly nonfocal  Discharge Instructions     Medication List    STOP taking these medications       levofloxacin 750 MG tablet  Commonly known as:  LEVAQUIN      TAKE these medications       acyclovir 400 MG tablet  Commonly known as:  ZOVIRAX  Take 400 mg by mouth daily.     ALPRAZolam 1 MG tablet  Commonly known as:  XANAX  Take 1 mg by mouth at bedtime as needed for anxiety (anxiety).     aspirin EC 81 MG tablet  Take  81 mg by mouth daily.     azithromycin 250 MG tablet  Commonly known as:  ZITHROMAX  Take 1 tablet (250 mg total) by mouth daily.     CALCIUM 500 + D PO  Take 1 tablet by mouth 2 (two) times daily.     dexamethasone 4 MG tablet  Commonly known as:  DECADRON  Take 12 mg by mouth once a week.     diltiazem 240 MG 24 hr capsule  Commonly known as:  CARDIZEM CD  Take 1 capsule (240 mg total) by mouth daily.     furosemide 20 MG tablet  Commonly known as:  LASIX  Take 1 tablet (20 mg total) by mouth daily.     HYDROcodone-homatropine 5-1.5 MG/5ML syrup  Commonly known as:  HYCODAN  Take 5 mLs by mouth every 6 (six) hours as needed for cough (trouble sleeping).     lenalidomide 10 MG capsule  Commonly known as:  REVLIMID  Take 1 capsule (10 mg total) by mouth daily.  losartan 50 MG tablet  Commonly known as:  COZAAR  Take 50 mg by mouth daily.     multivitamin with minerals Tabs tablet  Take 1 tablet by mouth daily.     oxyCODONE 5 MG immediate release tablet  Commonly known as:  ROXICODONE  Take 1 tablet (5 mg total) by mouth every 4 (four) hours as needed for severe pain.     polymixin-bacitracin 500-10000 UNIT/GM Oint ointment  Apply 1 application topically 2 (two) times daily. Generic ok     potassium chloride SA 20 MEQ tablet  Commonly known as:  K-DUR,KLOR-CON  Take 1 tablet (20 mEq total) by mouth 2 (two) times daily.     rosuvastatin 10 MG tablet  Commonly known as:  CRESTOR  Take 10 mg by mouth at bedtime.     tetrahydrozoline 0.05 % ophthalmic solution  Place 1 drop into both eyes 2 (two) times daily as needed (redness/dry eyes).     traZODone 50 MG tablet  Commonly known as:  DESYREL  Take 1 tablet (50 mg total) by mouth at bedtime as needed for sleep.           Follow-up Information   Schedule an appointment as soon as possible for a visit with [place], MD.   Specialty:  Family Medicine   Contact information:   [address] [place] [address] [phone]       Schedule an appointment as soon as possible for a visit with [facility], [place], MD.   Specialty:  Hematology and Oncology   Contact information:   [place] [id number] [phone]       Follow up with Faye Ramsay, MD. (As needed call my cell phone [phone])    Specialty:  Internal Medicine   Contact information:   [address] [place] [address] [phone]        The results of significant diagnostics from this hospitalization (including imaging, microbiology, ancillary and laboratory) are listed below for reference.     Microbiology: Recent Results (from the past 240 hour(s))  MRSA PCR SCREENING     Status: None   Collection Time    06/29/14 12:08 AM      Result Value Ref Range Status   MRSA by PCR NEGATIVE  NEGATIVE Final   Comment:            The GeneXpert MRSA Assay (FDA     approved for NASAL specimens     only), is one component of a     comprehensive MRSA colonization     surveillance program. It is not     intended to diagnose MRSA     infection nor to guide or     monitor treatment for     MRSA infections.  BODY FLUID CULTURE     Status: None   Collection Time    06/30/14  2:01 PM      Result Value Ref Range Status   Specimen Description PLEURAL RIGHT   Final   Special Requests NONE   Final   Gram Stain     Final   Value: ABUNDANT WBC PRESENT, PREDOMINANTLY MONONUCLEAR     NO ORGANISMS SEEN     Performed at [facility]   Culture     Final   Value: NO GROWTH 3 DAYS     Performed at [facility]   Report Status PENDING   Incomplete  Labs: Basic Metabolic Panel:  Recent Labs Lab 06/29/14 0230 06/29/14 0845 07/01/14 0410 07/02/14 0400 07/03/14 0500  NA 139 144 138 143 141  K 3.1* 3.5* 3.2* 4.0 3.9  CL 98 101 99 101 98  CO2 _0 GLUCOSE 96 95 67* 71 69*  BUN _1 CREATININE 0.76 0.74 0.62  0.63 0.67  CALCIUM 9.2 8.9 7.7* 8.2* 8.1*  MG  --   --  2.0  --   --    Liver Function Tests:  Recent Labs Lab 06/28/14 1352  AST 12  ALT 11  ALKPHOS 57  BILITOT 0.82  PROT 5.8*  ALBUMIN 3.0*   No results found for this basename: LIPASE, AMYLASE,  in the last 168 hours No results found for this basename: AMMONIA,  in the last 168 hours CBC:  Recent Labs Lab 06/28/14 1352  06/29/14 0230 06/30/14 0425 07/01/14 0410 07/02/14 0400 07/03/14 0500  WBC 9.2  < > 9.4 10.0 7.2 7.0 6.5  NEUTROABS 6.1  --   --   --   --   --   --   HGB 11.4*  < > 9.9* 9.9* 8.9* 8.6* 9.4*  HCT 36.0  < > 30.4* 30.9* 27.6* 26.5* 29.6*  MCV 98.2  < > 98.1 98.7 98.2 100.4* 100.0  PLT 193  < > 142* 152 120* 113* 134*  < > = values in this interval not displayed. Cardiac Enzymes:  Recent Labs Lab 06/28/14 1521 06/28/14 2100 06/29/14 0230 06/29/14 0845  CKTOTAL 18  --   --   --   CKMB 2.4  --   --   --   TROPONINI 0.08* <0.30 <0.30 <0.30   BNP: BNP (last 3 results)  Recent Labs  06/28/14 1650  PROBNP 2526.0*   CBG:  Recent Labs Lab 07/01/14 1152  GLUCAP 109*     SIGNED: Time coordinating discharge: Over 30 minutes  Faye Ramsay, MD  [facility] 07/03/2014, 5:34 PM Pager [phone]  If 7PM-7AM, please contact night-coverage [email or web address] Password [email or web address]

## 2014-07-03 NOTE — Progress Notes (Addendum)
Occupational Therapy Treatment Patient Details Name: [name] MRN: [id number] DOB: [birth year] Today's Date: 07/03/2014    History of present illness 78 year old female with multiple myeloma, osteopenia, hypertension, anxiety, normocytic anemia presented to [facility] long ED with main concern of generalized weakness, palpitations, exertional shortness of breath and was found to be in atrial fibrillation with heart rate and 150s. Pt s/p right thoracentesis on 10/3   OT comments  Pt fatigues quickly with transfer into bathroom to stand and brush her teeth. Needed to return to bed after to rest. Will continue to follow for acute OT.   Follow Up Recommendations  Home health OT;Supervision/Assistance - 24 hour    Equipment Recommendations  None recommended by OT    Recommendations for Other Services      Precautions / Restrictions Precautions Precautions: Fall       Mobility Bed Mobility   Bed Mobility: Supine to Sit;Sit to Supine     Supine to sit: Supervision Sit to supine: Supervision   General bed mobility comments: increased time for scooting to EOB. verbal cues for technique.  Transfers Overall transfer level: Needs assistance Equipment used: Rolling walker (2 wheeled) Transfers: Sit to/from Stand Sit to Stand: Min assist         General transfer comment: slight steady assist for rising and reaching for RW    Balance                                   ADL       Grooming: Min guard;Standing Grooming Details (indicate cue type and reason): fatigues rapidly                 Toilet Transfer: Minimal assistance;Ambulation;RW             General ADL Comments: Pt states she has already sponge bathed this am. She fatigued rapidly with transfer into bathroom with walker to stand and brush her teeth. She needed to quickly transfer back over to the bed to sit down. Sats on RA  at EOB 97% and HR 97. Standing at the sink 96% on RA and HR 116.  Return to EOB sats 98% HR 90s. Left O2 off and informed nursing. Discussed energy conservation techniques including resting when needed, purse lip breathing, sitting when possible including grooming. Advised pt and spouse that she may want to sponge bathe initially at home as showering even with a seat may be too fatiguing. Pt verbalized understanding.       Vision                     Perception     Praxis      Cognition   Behavior During Therapy: WFL for tasks assessed/performed Overall Cognitive Status:  (noted instance of pt stating she didnt use potty chair  at home and spouse said she does)                       Extremity/Trunk Assessment               Exercises     Shoulder Instructions       General Comments      Pertinent Vitals/ Pain       Pain Assessment: No/denies pain; 98% 2L HR 93 at rest, 97% supine on RA HR 97; 97% sitting EOB on RA; 96% standing to groom on RA HR  116;  98% RA sitting on bed resting, HR 90s. Left O2 off and nursing informed.    Home Living                                          Prior Functioning/Environment              Frequency Min 2X/week     Progress Toward Goals  OT Goals(current goals can now be found in the care plan section)  Progress towards OT goals: Progressing toward goals     Plan Discharge plan remains appropriate    Co-evaluation                 End of Session Equipment Utilized During Treatment: Rolling walker   Activity Tolerance Patient limited by fatigue   Patient Left in bed;with call bell/phone within reach;with family/visitor present   Nurse Communication          Time: 8251-8984 OT Time Calculation (min): 28 min  Charges: OT General Charges $OT Visit: 1 Procedure OT Treatments $Self Care/Home Management : 8-22 mins $Therapeutic Activity: 8-22 mins  Jules Schick [phone] 07/03/2014, 9:44 AM

## 2014-07-03 NOTE — Discharge Instructions (Signed)

## 2014-07-03 NOTE — Progress Notes (Signed)
Patient ID: [name], female   DOB: Nov 02, 1935, 78 y.o.   MRN: [id number]    DAILY PROGRESS NOTE  Subjective:  Breathing better no palpitations complains about lump near side of right breast   Objective:  Temp:  [97.8 F (36.6 C)-98.9 F (37.2 C)] 98.9 F (37.2 C) (10/05 2156) Pulse Rate:  [89-104] 104 (10/05 2156) Resp:  [18-20] 20 (10/05 2156) BP: (118-130)/(64-73) 130/73 mmHg (10/05 2156) SpO2:  [95 %-99 %] 95 % (10/05 2156) Weight change:   Intake/Output from previous day: 10/05 0701 - 10/06 0700 In: 1205 [P.O.:960; I.V.:245] Out: 725 [Urine:725]  Intake/Output from this shift:    Medications: Current Facility-Administered Medications  Medication Dose Route Frequency Provider Last Rate Last Dose  . 0.9 %  sodium chloride infusion   Intravenous Continuous Huey Bienenstock, MD 10 mL/hr at 07/01/14 0123    . acetaminophen (TYLENOL) tablet 650 mg  650 mg Oral Q6H PRN Huey Bienenstock, MD   650 mg at 07/02/14 0128   Or  . acetaminophen (TYLENOL) suppository 650 mg  650 mg Rectal Q6H PRN Huey Bienenstock, MD      . acyclovir (ZOVIRAX) tablet 400 mg  400 mg Oral Daily Huey Bienenstock, MD   400 mg at 07/02/14 0916  . ALPRAZolam Prudy Feeler) tablet 1 mg  1 mg Oral QHS PRN Huey Bienenstock, MD   1 mg at 07/02/14 2029  . alum & mag hydroxide-simeth (MAALOX/MYLANTA) 200-200-20 MG/5ML suspension 30 mL  30 mL Oral Q6H PRN Huey Bienenstock, MD   30 mL at 06/30/14 2158  . aspirin EC tablet 81 mg  81 mg Oral Daily Huey Bienenstock, MD   81 mg at 07/02/14 0916  . atorvastatin (LIPITOR) tablet 20 mg  20 mg Oral q1800 Huey Bienenstock, MD   20 mg at 07/02/14 1725  . azithromycin (ZITHROMAX) 500 mg in dextrose 5 % 250 mL IVPB  500 mg Intravenous Q24H Huey Bienenstock, MD 250 mL/hr at 07/02/14 1725 500 mg at 07/02/14 1725  . bisacodyl (DULCOLAX) suppository 10 mg  10 mg Rectal Daily PRN Huey Bienenstock, MD      . diltiazem (CARDIZEM CD) 24 hr capsule 180 mg  180 mg Oral Daily Chrystie Nose, MD   180 mg at 07/02/14 0916  . feeding supplement (ENSURE) (ENSURE) pudding 1 Container  1 Container Oral TID BM Dorothea Ogle, MD   1 Container at 07/02/14 1400  . furosemide (LASIX) tablet 20 mg  20 mg Oral Daily Chrystie Nose, MD   20 mg at 07/02/14 0917  . guaiFENesin (MUCINEX) 12 hr tablet 600 mg  600 mg Oral BID Huey Bienenstock, MD   600 mg at 07/02/14 2123  . heparin injection 5,000 Units  5,000 Units Subcutaneous 3 times per day Huey Bienenstock, MD   5,000 Units at 07/03/14 0552  . HYDROcodone-homatropine (HYCODAN) 5-1.5 MG/5ML syrup 5 mL  5 mL Oral Q6H PRN Huey Bienenstock, MD   5 mL at 07/02/14 2033  . lactose free nutrition (BOOST PLUS) liquid 237 mL  237 mL Oral PRN Tenny Craw, RD   237 mL at 06/29/14 1945  . magnesium hydroxide (MILK OF MAGNESIA) suspension 30 mL  30 mL Oral BID Dorothea Ogle, MD   30 mL at 07/01/14 1040  . multivitamin with minerals tablet 1 tablet  1 tablet Oral Daily Huey Bienenstock, MD   1 tablet at 07/02/14 0916  . ondansetron (ZOFRAN) tablet 4 mg  4 mg Oral Q6H PRN Mliss Fritz  Elgergawy, MD       Or  . ondansetron (ZOFRAN) injection 4 mg  4 mg Intravenous Q6H PRN Phillips Climes, MD      . oxyCODONE (Oxy IR/ROXICODONE) immediate release tablet 5 mg  5 mg Oral Q4H PRN Phillips Climes, MD   5 mg at 07/03/14 0552  . senna (SENOKOT) tablet 8.6 mg  1 tablet Oral BID Phillips Climes, MD   8.6 mg at 07/01/14 1046  . sodium chloride 0.9 % injection 10-40 mL  10-40 mL Intracatheter PRN Theodis Blaze, MD   10 mL at 07/03/14 0459    Physical Exam: Chronically ill frail female General appearance: alert and no distress Neck: no carotid bruit and no JVD Lungs: diminished breath sounds RLL Heart: regular rate and rhythm, S1, S2 normal and systolic murmur: early systolic 3/6, crescendo at 2nd right intercostal space Extremities: extremities normal, atraumatic, no cyanosis or edema  Lab Results: Results for orders placed during the hospital encounter of  06/28/14 (from the past 48 hour(s))  GLUCOSE, CAPILLARY     Status: Abnormal   Collection Time    07/01/14 11:52 AM      Result Value Ref Range   Glucose-Capillary 109 (*) 70 - 99 mg/dL  CBC     Status: Abnormal   Collection Time    07/02/14  4:00 AM      Result Value Ref Range   WBC 7.0  4.0 - 10.5 K/uL   RBC 2.64 (*) 3.87 - 5.11 MIL/uL   Hemoglobin 8.6 (*) 12.0 - 15.0 g/dL   HCT 26.5 (*) 36.0 - 46.0 %   MCV 100.4 (*) 78.0 - 100.0 fL   MCH 32.6  26.0 - 34.0 pg   MCHC 32.5  30.0 - 36.0 g/dL   RDW 17.6 (*) 11.5 - 15.5 %   Platelets 113 (*) 150 - 400 K/uL   Comment: CONSISTENT WITH PREVIOUS RESULT  BASIC METABOLIC PANEL     Status: Abnormal   Collection Time    07/02/14  4:00 AM      Result Value Ref Range   Sodium 143  137 - 147 mEq/L   Potassium 4.0  3.7 - 5.3 mEq/L   Comment: DELTA CHECK NOTED     REPEATED TO VERIFY   Chloride 101  96 - 112 mEq/L   CO2 24  19 - 32 mEq/L   Glucose, Bld 71  70 - 99 mg/dL   BUN 10  6 - 23 mg/dL   Creatinine, Ser 0.63  0.50 - 1.10 mg/dL   Calcium 8.2 (*) 8.4 - 10.5 mg/dL   GFR calc non Af Amer 85 (*) >90 mL/min   GFR calc Af Amer >90  >90 mL/min   Comment: (NOTE)     The eGFR has been calculated using the CKD EPI equation.     This calculation has not been validated in all clinical situations.     eGFR's persistently <90 mL/min signify possible Chronic Kidney     Disease.   Anion gap 18 (*) 5 - 15  CBC     Status: Abnormal   Collection Time    07/03/14  5:00 AM      Result Value Ref Range   WBC 6.5  4.0 - 10.5 K/uL   RBC 2.96 (*) 3.87 - 5.11 MIL/uL   Hemoglobin 9.4 (*) 12.0 - 15.0 g/dL   HCT 29.6 (*) 36.0 - 46.0 %   MCV 100.0  78.0 - 100.0 fL  MCH 31.8  26.0 - 34.0 pg   MCHC 31.8  30.0 - 36.0 g/dL   RDW 18.0 (*) 11.5 - 15.5 %   Platelets 134 (*) 150 - 400 K/uL  BASIC METABOLIC PANEL     Status: Abnormal   Collection Time    07/03/14  5:00 AM      Result Value Ref Range   Sodium 141  137 - 147 mEq/L   Potassium 3.9  3.7 - 5.3  mEq/L   Chloride 98  96 - 112 mEq/L   CO2 23  19 - 32 mEq/L   Glucose, Bld 69 (*) 70 - 99 mg/dL   BUN 11  6 - 23 mg/dL   Creatinine, Ser 0.67  0.50 - 1.10 mg/dL   Calcium 8.1 (*) 8.4 - 10.5 mg/dL   GFR calc non Af Amer 83 (*) >90 mL/min   GFR calc Af Amer >90  >90 mL/min   Comment: (NOTE)     The eGFR has been calculated using the CKD EPI equation.     This calculation has not been validated in all clinical situations.     eGFR's persistently <90 mL/min signify possible Chronic Kidney     Disease.   Anion gap 20 (*) 5 - 15    Imaging: Dg Chest Port 1 View  07/02/2014   CLINICAL DATA:  Shortness of breath.  EXAM: PORTABLE CHEST - 1 VIEW  COMPARISON:  06/30/2014.  Chest CT 04/19/2014.  FINDINGS: A power port catheter is in stable position. Mediastinal structures are normal. Right lower lobe atelectasis. Interval slight increase in right pleural effusion and/or pleural based mass. No pneumothorax. No acute bony abnormality.  IMPRESSION: 1. There is persistent right lower lobe atelectasis. 2. Interim slight progression of right pleural effusion and/or pleural mass.   Electronically Signed   By: Marcello Moores  Register   On: 07/02/2014 07:37    Assessment:  Principal Problem:   Atrial fibrillation with RVR Active Problems:   Essential hypertension   Multiple myeloma   Hyperlipidemia   History of atrial fibrillation   Pleural effusion   URI, acute   Plan:  Appears euvolemic.  Telemetry review shows sinus rhythm will less frequent PAC;s  Continue cardizem 180 mg daily.  Less dyspnic post right thoracentesis  Consider IS  Some atelectasis right base Cytology pending.  Cultures negative Plan per primary service  Increase cardizem to 240 mg daily.  Will sign off call if needed   Time Spent Directly with Patient:  15 minutes  Length of Stay:  LOS: 5 days    Jenkins Rouge 07/03/2014, 8:11 AM

## 2014-07-03 NOTE — Progress Notes (Signed)
Report received from Junior, Therapist, sports. Patient is awake in bed at this time, denies pain, no distress noted. Denies any additional needs at this time. No change from prior assessment.

## 2014-07-04 ENCOUNTER — Inpatient Hospital Stay (HOSPITAL_COMMUNITY): Payer: Medicare Other

## 2014-07-04 DIAGNOSIS — E440 Moderate protein-calorie malnutrition: Secondary | ICD-10-CM

## 2014-07-04 LAB — CBC
HCT: 30.9 % — ABNORMAL LOW (ref 36.0–46.0)
Hemoglobin: 10.1 g/dL — ABNORMAL LOW (ref 12.0–15.0)
MCH: 32.4 pg (ref 26.0–34.0)
MCHC: 32.7 g/dL (ref 30.0–36.0)
MCV: 99 fL (ref 78.0–100.0)
PLATELETS: 195 10*3/uL (ref 150–400)
RBC: 3.12 MIL/uL — AB (ref 3.87–5.11)
RDW: 18.1 % — AB (ref 11.5–15.5)
WBC: 7.2 10*3/uL (ref 4.0–10.5)

## 2014-07-04 LAB — BASIC METABOLIC PANEL
ANION GAP: 19 — AB (ref 5–15)
BUN: 11 mg/dL (ref 6–23)
CHLORIDE: 94 meq/L — AB (ref 96–112)
CO2: 25 meq/L (ref 19–32)
Calcium: 9.3 mg/dL (ref 8.4–10.5)
Creatinine, Ser: 0.68 mg/dL (ref 0.50–1.10)
GFR calc non Af Amer: 83 mL/min — ABNORMAL LOW (ref >90)
Glucose, Bld: 70 mg/dL (ref 70–99)
Potassium: 4 mEq/L (ref 3.7–5.3)
SODIUM: 138 meq/L (ref 137–147)

## 2014-07-04 LAB — BODY FLUID CULTURE: Culture: NO GROWTH

## 2014-07-04 LAB — CULTURE, BLOOD (SINGLE)

## 2014-07-04 MED ORDER — FUROSEMIDE 40 MG PO TABS: 40.0000 mg | ORAL_TABLET | Freq: Every day | ORAL | Status: DC

## 2014-07-04 MED ORDER — FUROSEMIDE 10 MG/ML IJ SOLN
40.0000 mg | Freq: Two times a day (BID) | INTRAMUSCULAR | Status: DC
  Filled 2014-07-04 (×3): qty 4

## 2014-07-04 MED ORDER — FUROSEMIDE 10 MG/ML IJ SOLN
40.0000 mg | Freq: Once | INTRAMUSCULAR | Status: AC
  Administered 2014-07-04: 40 mg via INTRAVENOUS
  Filled 2014-07-04: qty 4

## 2014-07-04 NOTE — Progress Notes (Signed)
[facility] PROGRESS NOTE  [name] ZOX:[id number] DOB: Nov 15, 1935 DOA: 06/28/2014 PCP: [name], MD  Assessment/Plan  Atrial fibrillation with RVR  - Patient admitted to step down unit due to requirement of Cardizem drip  - Heart rate in 90s-100s with occasional tachycardia to 120s with exertion, sinus rhythma - okay to d/c telemetry  - continue Cardizem per cardiology rec's   Acute respiratory failure with hypoxia likely secondary to acute onset of atrial fibrillation with RVR, plus R pleural effusion, and possible PNA based on patient's presenting symptoms  - Atrial fibrillation treated with Cardizem as noted above  - Zithromax day # 7 of 7  - urine Legionella and strep pneumo negative - Repeat x-ray 10/03 with worsening right-sided pleural effusion, requested IR consult for therapeutic and diagnostic thoracentesis  - pt is now s/p right side thoracentesis, post op day #3 and fluid has already accumulated - increasing SOB today and still with mod to large pleural effusion  Right-sided pleural effusion, likely related to her myeloma and has been present for several months.  Diastolic haert failure would be less likely given unilateral nature, but possible.   - Worsening on chest x-ray 06/30/2014 compared to chest x-ray on admission  - s/p thoracentesis, right side, 1.1 L removed, follow up on studies  - change to IV lasix BID for now -  Will discuss possibility of palliative pleurex catheter with oncology:  No pleurex at this time.    Chronic diastolic congestive heart failure stage I  - Weight: 147 pounds --> 145 pounds --> 144 lbs - 2-D [facility] 06/28/2014 notable for normal EF 81-19%, grade 1 diastolic dysfunction   Multiple myeloma  - Appreciate Dr.Gorsuch assistance   Hyperlipidemia  - Stable, continue statin   Severe protein calorie malnutrition  - Secondary to acute on chronic illnesses outlined above  - Advance diet as patient able to tolerate and add  nutritional supplements   Acute on chronic blood loss anemia  - Slight drop in hemoglobin since admission  - No signs of active bleeding, repeat CBC in the morning   Essential hypertension with mildly low to normal blood pressures - continue diltiazem  Hypokalemia, resolved with oral supplementation - supplemented and WNL this AM    Diet:  regular Access:  port IVF:  off Proph:  heparin  Code Status: DNR Family Communication: patient and her husband Disposition Plan: pending reevaluation by PT/OT.  I think she would benefit from SNF for rehab at discharge.  SW consult placed.    IV Access:   Peripheral IV Procedures and diagnostic studies:   CXR 06/28/2014 Enlarging right-sided pleural effusion. Chronic changes.  CXR 06/30/2014 Increased volume of the right pleural effusion since yesterday's study. Mild interstitial edema has developed on the left. Medical Consultants:   Cardiology  IR for thoracentesis  Other Consultants:   None  Anti-Infectives:   Zithromax 10/01 --> continue for 5 more days post discharge    HPI/Subjective:  Weaker and more SOB.    Objective: Filed Vitals:   07/03/14 1043 07/03/14 1439 07/03/14 2120 07/04/14 0500  BP: 123/67 99/77 136/65 127/68  Pulse:  91 97 89  Temp:  98.8 F (37.1 C) 98.2 F (36.8 C) 98 F (36.7 C)  TempSrc:  Oral Oral Oral  Resp:  _0 Height:      Weight:      SpO2:  98% 95% 94%    Intake/Output Summary (Last 24 hours) at 07/04/14 1324 Last data filed at  07/04/14 0900  Gross per 24 hour  Intake   2180 ml  Output      0 ml  Net   2180 ml   Filed Weights   06/30/14 1503 07/01/14 0547 07/02/14 0500  Weight: 64.864 kg (143 lb) 65.545 kg (144 lb 8 oz) 65.409 kg (144 lb 3.2 oz)    Exam:   General:  WF, No acute distress  HEENT:  NCAT, MMM  Cardiovascular:  RRR, nl S1, S2 no mrg, 2+ pulses, warm extremities  Respiratory:  Right side diminished at base, no wheezes or rhonchi, no increased WOB  Abdomen:    NABS, soft, NT/ND  MSK:   Normal tone and bulk, no LEE  Neuro:  Grossly intact  Data Reviewed: Basic Metabolic Panel:  Recent Labs Lab 06/29/14 0845 07/01/14 0410 07/02/14 0400 07/03/14 0500 07/04/14 0516  NA 144 138 143 141 138  K 3.5* 3.2* 4.0 3.9 4.0  CL 101 99 101 98 94*  CO2 _0 GLUCOSE 95 67* 71 69* 70  BUN _1 CREATININE 0.74 0.62 0.63 0.67 0.68  CALCIUM 8.9 7.7* 8.2* 8.1* 9.3  MG  --  2.0  --   --   --    Liver Function Tests:  Recent Labs Lab 06/28/14 1352  AST 12  ALT 11  ALKPHOS 57  BILITOT 0.82  PROT 5.8*  ALBUMIN 3.0*   No results found for this basename: LIPASE, AMYLASE,  in the last 168 hours No results found for this basename: AMMONIA,  in the last 168 hours CBC:  Recent Labs Lab 06/28/14 1352  06/30/14 0425 07/01/14 0410 07/02/14 0400 07/03/14 0500 07/04/14 0516  WBC 9.2  < > 10.0 7.2 7.0 6.5 7.2  NEUTROABS 6.1  --   --   --   --   --   --   HGB 11.4*  < > 9.9* 8.9* 8.6* 9.4* 10.1*  HCT 36.0  < > 30.9* 27.6* 26.5* 29.6* 30.9*  MCV 98.2  < > 98.7 98.2 100.4* 100.0 99.0  PLT 193  < > 152 120* 113* 134* 195  < > = values in this interval not displayed. Cardiac Enzymes:  Recent Labs Lab 06/28/14 1521 06/28/14 2100 06/29/14 0230 06/29/14 0845  CKTOTAL 18  --   --   --   CKMB 2.4  --   --   --   TROPONINI 0.08* <0.30 <0.30 <0.30   BNP (last 3 results)  Recent Labs  06/28/14 1650  PROBNP 2526.0*   CBG:  Recent Labs Lab 07/01/14 1152  GLUCAP 109*    Recent Results (from the past 240 hour(s))  CULTURE, BLOOD (SINGLE)     Status: None   Collection Time    06/28/14  3:21 PM      Result Value Ref Range Status   Blood Culture, Routine Culture, Blood   Final   Comment: Final - ===== FINAL REPORT =====NO GROWTH 5 DAYS  MRSA PCR SCREENING     Status: None   Collection Time    06/29/14 12:08 AM      Result Value Ref Range Status   MRSA by PCR NEGATIVE  NEGATIVE Final   Comment:            The  GeneXpert MRSA Assay (FDA     approved for NASAL specimens     only), is one component of a     comprehensive MRSA colonization  surveillance program. It is not     intended to diagnose MRSA     infection nor to guide or     monitor treatment for     MRSA infections.  BODY FLUID CULTURE     Status: None   Collection Time    06/30/14  2:01 PM      Result Value Ref Range Status   Specimen Description PLEURAL RIGHT   Final   Special Requests NONE   Final   Gram Stain     Final   Value: ABUNDANT WBC PRESENT, PREDOMINANTLY MONONUCLEAR     NO ORGANISMS SEEN     Performed at [facility]   Culture     Final   Value: NO GROWTH 3 DAYS     Performed at [facility]   Report Status 07/04/2014 FINAL   Final     Studies: Dg Chest 2 View  07/04/2014   CLINICAL DATA:  Subsequent encounter or abnormal cardiac rhythm. Status post right thoracentesis. Multiple myeloma.  EXAM: CHEST  2 VIEW  COMPARISON:  One-view chest 07/02/2014.  FINDINGS: The heart is partially obscured by opacification of the right base. Atherosclerotic changes are again noted. A right IJ Port-A-Cath is stable in position. A right pleural effusion is increasing. Associated airspace disease is again noted. The left base is clear.  IMPRESSION: 1. Re-accumulation of right pleural effusion. 2. Underlying right lower lobe airspace disease. While this likely reflects atelectasis, is not excluded.   Electronically Signed   By: Lawrence Santiago M.D.   On: 07/04/2014 02:20   Dg Chest Port 1 View  07/04/2014   CLINICAL DATA:  Subsequent encounter for re- evaluation of right pleural effusion.  EXAM: PORTABLE CHEST - 1 VIEW  COMPARISON:  06/30/2014  FINDINGS: There is a right chest wall port a catheter with tip in the projection of the cavoatrial junction. The heart size appears normal. Right pleural effusion is unchanged in volume from previous exam. No change in aeration to the right lower lobe. Left lung appears clear.   IMPRESSION: 1. No significant change in volume of the moderate to large right pleural effusion.   Electronically Signed   By: Kerby Moors M.D.   On: 07/04/2014 08:13    Scheduled Meds: . acyclovir  400 mg Oral Daily  . aspirin EC  81 mg Oral Daily  . atorvastatin  20 mg Oral q1800  . azithromycin  500 mg Intravenous Q24H  . diltiazem  240 mg Oral Daily  . feeding supplement (ENSURE)  1 Container Oral TID BM  . furosemide  40 mg Intravenous Once  . [START ON 07/05/2014] furosemide  40 mg Oral Daily  . guaiFENesin  600 mg Oral BID  . heparin  5,000 Units Subcutaneous 3 times per day  . magnesium hydroxide  30 mL Oral BID  . multivitamin with minerals  1 tablet Oral Daily  . senna  1 tablet Oral BID   Continuous Infusions: . sodium chloride 10 mL/hr at 07/01/14 0123    Principal Problem:   Atrial fibrillation with RVR Active Problems:   Essential hypertension   Multiple myeloma   Hyperlipidemia   History of atrial fibrillation   Pleural effusion   URI, acute    Time spent: 30 min    Ezrael Sam, [facility] Pager [phone]. If 7PM-7AM, please contact night-coverage at [email or web address], password [facility] 07/04/2014, 1:24 PM  LOS: 6 days

## 2014-07-05 ENCOUNTER — Encounter: Payer: Self-pay | Admitting: Specialist

## 2014-07-05 ENCOUNTER — Telehealth: Payer: Self-pay | Admitting: *Deleted

## 2014-07-05 DIAGNOSIS — I480 Paroxysmal atrial fibrillation: Principal | ICD-10-CM

## 2014-07-05 DIAGNOSIS — J910 Malignant pleural effusion: Secondary | ICD-10-CM

## 2014-07-05 DIAGNOSIS — D696 Thrombocytopenia, unspecified: Secondary | ICD-10-CM

## 2014-07-05 LAB — CBC
HEMATOCRIT: 31.3 % — AB (ref 36.0–46.0)
Hemoglobin: 10.1 g/dL — ABNORMAL LOW (ref 12.0–15.0)
MCH: 32.1 pg (ref 26.0–34.0)
MCHC: 32.3 g/dL (ref 30.0–36.0)
MCV: 99.4 fL (ref 78.0–100.0)
Platelets: 188 10*3/uL (ref 150–400)
RBC: 3.15 MIL/uL — AB (ref 3.87–5.11)
RDW: 18.2 % — ABNORMAL HIGH (ref 11.5–15.5)
WBC: 7.3 10*3/uL (ref 4.0–10.5)

## 2014-07-05 LAB — BASIC METABOLIC PANEL
Anion gap: 22 — ABNORMAL HIGH (ref 5–15)
BUN: 13 mg/dL (ref 6–23)
CO2: 26 mEq/L (ref 19–32)
CREATININE: 0.75 mg/dL (ref 0.50–1.10)
Calcium: 9.7 mg/dL (ref 8.4–10.5)
Chloride: 94 mEq/L — ABNORMAL LOW (ref 96–112)
GFR calc Af Amer: >90 mL/min (ref >90)
GFR calc non Af Amer: 80 mL/min — ABNORMAL LOW (ref >90)
GLUCOSE: 79 mg/dL (ref 70–99)
POTASSIUM: 3.7 meq/L (ref 3.7–5.3)
Sodium: 142 mEq/L (ref 137–147)

## 2014-07-05 MED ORDER — HEPARIN SOD (PORK) LOCK FLUSH 100 UNIT/ML IV SOLN
500.0000 [IU] | INTRAVENOUS | Status: AC | PRN
Start: 2014-07-05 — End: 2014-07-05
  Administered 2014-07-05: 500 [IU]

## 2014-07-05 NOTE — Telephone Encounter (Signed)
Call from [facility].  Gave them Verbal Order from Dr. Alvy Bimler for [place].  Request Hospice MD to provide symptom management orders.  Their phone number [phone].

## 2014-07-05 NOTE — Progress Notes (Signed)
Spoke with pt and spouse at bedside concerning Home with Hospice. [name] was selected. Information called [phone] and faxed to [phone] [facility].

## 2014-07-05 NOTE — Progress Notes (Signed)
[name]   DOB:1935-10-05   ZO#:[id number]   JWJ#:[id number]  I have seen the patient, examined her and edited the notes as follows  Subjective: Patient seen and examined. Afebrile.No new issues overnight. Denies chest pain or palpitations. Denies worsening dyspnea or cough. Denies nausea or vomiting. Appetite normal Denies constipation or urinary complaints. No confusion is reported. Ambulates within room, but fatigues rapidly if trying to increase distance.   Scheduled Meds: . acyclovir  400 mg Oral Daily  . aspirin EC  81 mg Oral Daily  . atorvastatin  20 mg Oral q1800  . azithromycin  500 mg Intravenous Q24H  . diltiazem  240 mg Oral Daily  . feeding supplement (ENSURE)  1 Container Oral TID BM  . furosemide  40 mg Intravenous BID  . guaiFENesin  600 mg Oral BID  . heparin  5,000 Units Subcutaneous 3 times per day  . magnesium hydroxide  30 mL Oral BID  . multivitamin with minerals  1 tablet Oral Daily  . senna  1 tablet Oral BID   Continuous Infusions: . sodium chloride 10 mL/hr at 07/01/14 0123   PRN Meds:acetaminophen, acetaminophen, ALPRAZolam, alum & mag hydroxide-simeth, bisacodyl, HYDROcodone-homatropine, lactose free nutrition, ondansetron (ZOFRAN) IV, ondansetron, oxyCODONE, sodium chloride   Objective:  Filed Vitals:   07/05/14 0500  BP: 135/74  Pulse: 92  Temp: 97.6 F (36.4 C)  Resp: 18      Intake/Output Summary (Last 24 hours) at 07/05/14 0705 Last data filed at 07/05/14 0500  Gross per 24 hour  Intake    560 ml  Output    250 ml  Net    310 ml    ECOG PERFORMANCE STATUS: 2-3  GENERAL:alert, no distress and ill appearing.  SKIN: skin color, texture, turgor are normal, no rashes or significant lesions, with some areas of ecchymoses at the venipuncture site. Alopecia noted. EYES: normal, Conjunctiva are pink and non-injected, sclera clear  OROPHARYNX: no exudate, no erythema and lips, buccal mucosa, and tongue normal  NECK: supple, thyroid normal  size, non-tender, without nodularity  LYMPH: no palpable lymphadenopathy in the cervical, axillary or inguinal area LUNGS: Reduced breath sounds on the right lung base, worse than prior day. Normal breathing effort.  HEART: regular rate and rhythm. Trace leg edema on the left.  ABDOMEN: abdomen soft, non-tender and normal bowel sounds  Musculoskeletal:no cyanosis of digits and no clubbing  NEURO: alert & oriented x 3 with fluent speech, no focal motor/sensory deficits.    CBG (last 3)  No results found for this basename: GLUCAP,  in the last 72 hours   Labs:   Recent Labs Lab 06/28/14 1352  07/01/14 0410 07/02/14 0400 07/03/14 0500 07/04/14 0516 07/05/14 0450  WBC 9.2  < > 7.2 7.0 6.5 7.2 7.3  HGB 11.4*  < > 8.9* 8.6* 9.4* 10.1* 10.1*  HCT 36.0  < > 27.6* 26.5* 29.6* 30.9* 31.3*  PLT 193  < > 120* 113* 134* 195 188  MCV 98.2  < > 98.2 100.4* 100.0 99.0 99.4  MCH 31.2  < > 31.7 32.6 31.8 32.4 32.1  MCHC 31.7  < > 32.2 32.5 31.8 32.7 32.3  RDW 18.0*  < > 17.4* 17.6* 18.0* 18.1* 18.2*  LYMPHSABS 1.1  --   --   --   --   --   --   MONOABS 1.1*  --   --   --   --   --   --   EOSABS 0.8*  --   --   --   --   --   --  BASOSABS 0.1  --   --   --   --   --   --   < > = values in this interval not displayed.   Chemistries:    Recent Labs Lab 06/28/14 1352  07/01/14 0410 07/02/14 0400 07/03/14 0500 07/04/14 0516 07/05/14 0450  NA 146*  < > 138 143 141 138 142  K 3.3*  < > 3.2* 4.0 3.9 4.0 3.7  CL  --   < > 99 101 98 94* 94*  CO2 20*  < > $R'24 24 23 25 26  'sF$ GLUCOSE 99  < > 67* 71 69* 70 79  BUN 11.0  < > $R'10 10 11 11 13  'cW$ CREATININE 0.8  < > 0.62 0.63 0.67 0.68 0.75  CALCIUM 10.0  < > 7.7* 8.2* 8.1* 9.3 9.7  MG  --   --  2.0  --   --   --   --   AST 12  --   --   --   --   --   --   ALT 11  --   --   --   --   --   --   ALKPHOS 57  --   --   --   --   --   --   BILITOT 0.82  --   --   --   --   --   --   < > = values in this interval not displayed.  GFR Estimated  Creatinine Clearance: 56 ml/min (by C-G formula based on Cr of 0.75).  Liver Function Tests:  Recent Labs Lab 06/28/14 1352  AST 12  ALT 11  ALKPHOS 57  BILITOT 0.82  PROT 5.8*  ALBUMIN 3.0*   Coagulation profile  Recent Labs Lab 06/28/14 1521  INR 1.20    Cardiac Enzymes:  Recent Labs Lab 06/28/14 1521 06/28/14 2100 06/29/14 0230 06/29/14 0845  CKTOTAL 18  --   --   --   CKMB 2.4  --   --   --   TROPONINI 0.08* <0.30 <0.30 <0.30       Imaging Studies:  Dg Chest 2 View  07/04/2014   CLINICAL DATA:  Subsequent encounter or abnormal cardiac rhythm. Status post right thoracentesis. Multiple myeloma.  EXAM: CHEST  2 VIEW  COMPARISON:  One-view chest 07/02/2014.  FINDINGS: The heart is partially obscured by opacification of the right base. Atherosclerotic changes are again noted. A right IJ Port-A-Cath is stable in position. A right pleural effusion is increasing. Associated airspace disease is again noted. The left base is clear.  IMPRESSION: 1. Re-accumulation of right pleural effusion. 2. Underlying right lower lobe airspace disease. While this likely reflects atelectasis, is not excluded.   Electronically Signed   By: Lawrence Santiago M.D.   On: 07/04/2014 02:20   Dg Chest Port 1 View  07/04/2014   CLINICAL DATA:  Subsequent encounter for re- evaluation of right pleural effusion.  EXAM: PORTABLE CHEST - 1 VIEW  COMPARISON:  06/30/2014  FINDINGS: There is a right chest wall port a catheter with tip in the projection of the cavoatrial junction. The heart size appears normal. Right pleural effusion is unchanged in volume from previous exam. No change in aeration to the right lower lobe. Left lung appears clear.  IMPRESSION: 1. No significant change in volume of the moderate to large right pleural effusion.   Electronically Signed   By: Kerby Moors M.D.   On: 07/04/2014  08:13    Assessment/Plan: 78 y.o.  Atrial fibrillation with RVR  This was likely triggered by recent  infection. Clinically, she has cardiovascular instability.  A recent echo had shown normal ejection fraction, no diastolic dysfunction, thus has not been repeated at this time. Cardiac enzymes were negative  Placed on Cardizem drip, then switched to oral form, with conversion to sinus rhythm Blood cultures negative. MRSA negative. She was being treated for bronchitis by her PCP.  She is on aspirin therapy to reduce risk of stroke.  Multiple myeloma  Her treatment was placed on hold due to cardiac symptoms.    Anemia in neoplastic disease  This is likely anemia of chronic disease. The patient denies recent history of bleeding such as epistaxis, hematuria or hematochezia. She is asymptomatic from the anemia.  Will continue to observe. She does not require transfusion now.   Thrombocytopenia Due to recent infection, dilution, polypharmacy, and blood in pleural fluid. Patient is on heparin. Will monitor closely. No transfusion required at this time as no other bleeding issues including hemoptysis, epistaxis, hematuria or hematochezia are reported.  Hypokalemia  This is due to poor oral intake. The patient received replacement therapy by primary team, with resolution of hypokalemia as of 10/5.  Acute upper respiratory infection Malignant Right Pleural Effusion Chest x ray on admission showed enlarging right sided pleural effusion.  She underwent therapeutic right thoracentesis on 10/3, yielding 1.15 l of blood tinged fluid sample of this fluid was sent for cytology, with results consistent with plasmablastic neoplasm.The neoplastic cells are positive for LCA, CD79a, and cytoplasmic kappa. The tumor cells are negative for CD3 and CD20. CD138 is equivocal. 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   New chest x ray showed interim slight increase of this right pleural effusion, with repeat x ray on 10/7 demonstrating no significant changes.  As patient  is symptomatic, she may undergo a repeat thoracentesis by Interventional Radiology prior to discharge. She is on azithromycin and guaifenesin per admitting team She is too weak for more treatment and without treatment the malignant effusion is unlikely going to improve. Ultimately she decided to be discharged with plan for home hospice.  DVT prophylaxis On Heparin and mechanical devices  DNR Other medical issues as per admitting team   Discharge planning Once her weakness is improved and physical therapist felt she is safe to be discharged home. Will sign off.  Rondel Jumbo, PA-C 07/05/2014  7:05 AM Kendra Lanphear, MD 07/05/2014

## 2014-07-05 NOTE — Discharge Summary (Signed)
Physician Discharge Summary  [name] ASN:[id number] DOB: 1936/07/01 DOA: 06/28/2014  PCP: [name], MD  Admit date: 06/28/2014 Discharge date: 07/05/2014  Recommendations for Outpatient Follow-up:  1. Home with home hospice 2. F/u with Dr. Alvy Bimler in 2 weeks and primary care as needed 3. Cardiology, Dr. Johnsie Cancel in 1 month or sooner as needed  Discharge Diagnoses:  Principal Problem:   Atrial fibrillation with RVR Active Problems:   Essential hypertension   Multiple myeloma   Hyperlipidemia   Anemia of chronic disease   History of atrial fibrillation   Malignant pleural effusion   Protein-calorie malnutrition, moderate   URI, acute   Discharge Condition: stable, fair  Diet recommendation: regular  Wt Readings from Last 3 Encounters:  07/02/14 65.409 kg (144 lb 3.2 oz)  06/28/14 64.864 kg (143 lb)  06/26/14 64.864 kg (143 lb)    History of present illness:  [name] is a 78 y.o. female, with history of multiple myeloma who was sent from oncology clinic when she was found to be in a-fib with RVR, heart rate in the 150s.  She was given IV Cardizem bolus and started on Cardizem drip,  and hospitalist requested to admit the patient for further treatment and evaluation.  Patient denied history of A. fib, but it is recorded in her history that she has had paroxysmal A. Fib. She denied any chest pain, hemoptysis, palpitation and shortness of breath.   Hospital Course:   Atrial fibrillation with RVR.  She was admitted to step down unit on Cardizem drip.  Cardiology was consulted and she was transitioned to oral diltiazem.  She spontaneously converted to NSR but remained mildly tachycardic.  She was not started on anticoagulation secondary to her other comorbidities.  See below.    Acute respiratory failure with hypoxia likely secondary to acute onset of atrial fibrillation with RVR, plus malignant R pleural effusion, and possible CAP based on patient's presenting  symptoms.  Atrial fibrillation was treated with Cardizem as noted above.  She completed a 7-day course of azithromycin for possible pneumonia.  Urine Legionella and strep pneumo antigens were negative.  Blood cultures are no growth to date.  Repeat x-ray 10/03 demonstrated worsening right-sided pleural effusion and she underwent therapeutic and diagnostic thoracentesis by IR on 10/7.  Her fluid quickly reaccummulated within three days and she continued to have some shortness of breath and chest pressure related to her effusion.  Her effusion is malignant and likely related to the large right-sided plasmacytoma.  We attempted diuresis with lasix with minimal improvement in her symptoms.  Dr. Alvy Bimler recommended against palliative pleurex catheter because they can often drain so much protein that it causes anasarca and other problems.    Chronic diastolic congestive heart failure stage I.  With lasix she diuresed about three pounds.  2-D ECHO 06/28/2014 notable for normal EF 19-37%, grade 1 diastolic dysfunction.  Multiple myeloma with large right chest wall plasmacytoma and malignant right pleural effusion.  She has had progression of her disease and the chemotherapy that would be needed to treat her palliatively would have significant side effects.  Dr.Gorsuch discussed the possibility of resuming chemotherapy vs. Starting home hospice.  The family opted for home hospice and they met with case management who facilitated setting up hospice services.    Hyperlipidemia.  At request of family, I have discontinued statin.  Severe protein calorie malnutrition, Secondary to acute on chronic illnesses outlined above.  Regular diet with supplements.  Anemia of chronic disease related  to MM.  Hemoglobin stable around 10 mg/dl.    Essential hypertension with mildly low to normal blood pressures.  Continue diltiazem.  Hypokalemia, resolved with oral supplementation.    Procedures and diagnostic studies:   CXR  06/28/2014 Enlarging right-sided pleural effusion. Chronic changes.  CXR 06/30/2014 Increased volume of the right pleural effusion since yesterday's study. Mild interstitial edema has developed on the left. 10/7 CXR:  Persistent moderate to large right pleural effusion Medical Consultants:   Cardiology  IR for thoracentesis  Other Consultants:   None  Anti-Infectives:   Zithromax 10/01 -->10/7    Discharge Exam: Filed Vitals:   07/05/14 0931  BP: 131/71  Pulse:   Temp:   Resp:    Filed Vitals:   07/04/14 1425 07/04/14 2100 07/05/14 0500 07/05/14 0931  BP: 125/67 125/76 135/74 131/71  Pulse: 92 98 92   Temp: 97.7 F (36.5 C) 98.2 F (36.8 C) 97.6 F (36.4 C)   TempSrc: Oral Oral Oral   Resp: $Remo'20 20 18   'YvDzb$ Height:      Weight:      SpO2: 96% 96% 97%     General: WF, No acute distress  HEENT: NCAT, MMM  Cardiovascular: RRR, nl S1, S2 no mrg, 2+ pulses, warm extremities  Respiratory: Right side diminished at base, no wheezes or rhonchi, no increased WOB  Abdomen: NABS, soft, NT/ND  MSK: Normal tone and bulk, no LEE  Neuro: Grossly intact   Discharge Instructions      Discharge Instructions   Call MD for:  difficulty breathing, headache or visual disturbances    Complete by:  As directed      Call MD for:  extreme fatigue    Complete by:  As directed      Call MD for:  hives    Complete by:  As directed      Call MD for:  persistant dizziness or light-headedness    Complete by:  As directed      Call MD for:  persistant nausea and vomiting    Complete by:  As directed      Call MD for:  severe uncontrolled pain    Complete by:  As directed      Call MD for:  temperature >100.4    Complete by:  As directed      Diet general    Complete by:  As directed      Discharge instructions    Complete by:  As directed   You were hospitalized with atrial fibrillation and fast heart beat which can cause shortness of breath.  Please take diltiazem to slow your heart rate  down.  You have a pleural effusion which is fluid around your right lung related to the cancer tumor on your right chest wall.  You may try using lasix at home to reduce this fluid if you would like.  You have completed a full course of antibiotics during this hospitalization.  In order to simplify your medications, you may stop your aspirin, calcium, lenalidomide, losartan, and rosuvastatin.  You will be set up with home hospice and may call them if you have any questions or concerns.     Increase activity slowly    Complete by:  As directed             Medication List    STOP taking these medications       aspirin EC 81 MG tablet     CALCIUM 500 + D  PO     lenalidomide 10 MG capsule  Commonly known as:  REVLIMID     levofloxacin 750 MG tablet  Commonly known as:  LEVAQUIN     losartan 50 MG tablet  Commonly known as:  COZAAR     rosuvastatin 10 MG tablet  Commonly known as:  CRESTOR      TAKE these medications       acyclovir 400 MG tablet  Commonly known as:  ZOVIRAX  Take 400 mg by mouth daily.     ALPRAZolam 1 MG tablet  Commonly known as:  XANAX  Take 1 mg by mouth at bedtime as needed for anxiety (anxiety).     dexamethasone 4 MG tablet  Commonly known as:  DECADRON  Take 12 mg by mouth once a week.     diltiazem 240 MG 24 hr capsule  Commonly known as:  CARDIZEM CD  Take 1 capsule (240 mg total) by mouth daily.     furosemide 20 MG tablet  Commonly known as:  LASIX  Take 1 tablet (20 mg total) by mouth daily.     HYDROcodone-homatropine 5-1.5 MG/5ML syrup  Commonly known as:  HYCODAN  Take 5 mLs by mouth every 6 (six) hours as needed for cough (trouble sleeping).     multivitamin with minerals Tabs tablet  Take 1 tablet by mouth daily.     oxyCODONE 5 MG immediate release tablet  Commonly known as:  ROXICODONE  Take 1 tablet (5 mg total) by mouth every 4 (four) hours as needed for severe pain.     polymixin-bacitracin 500-10000 UNIT/GM Oint  ointment  Apply 1 application topically 2 (two) times daily. Generic ok     potassium chloride SA 20 MEQ tablet  Commonly known as:  K-DUR,KLOR-CON  Take 1 tablet (20 mEq total) by mouth 2 (two) times daily.     tetrahydrozoline 0.05 % ophthalmic solution  Place 1 drop into both eyes 2 (two) times daily as needed (redness/dry eyes).     traZODone 50 MG tablet  Commonly known as:  DESYREL  Take 1 tablet (50 mg total) by mouth at bedtime as needed for sleep.       Follow-up Information   Schedule an appointment as soon as possible for a visit with METHENEY,CATHERINE, MD.   Specialty:  Family Medicine   Contact information:   [address] [place] [address] [phone]       Follow up with [facility], NI, MD. Schedule an appointment as soon as possible for a visit in 2 weeks.   Specialty:  Hematology and Oncology   Contact information:   [place] [address] [phone]       Follow up with Faye Ramsay, MD. (As needed call my cell phone [phone])    Specialty:  Internal Medicine   Contact information:   [address] [place] [address] [phone]       Follow up with Jenkins Rouge, MD. Schedule an appointment as soon as possible for a visit in 1 month. (As needed)    Specialty:  Cardiology   Contact information:   [address] [place] [address] [phone]        The results of significant diagnostics from this hospitalization (including imaging, microbiology, ancillary and laboratory) are listed below for reference.    Significant Diagnostic Studies: Dg Chest 1 View  06/30/2014   CLINICAL DATA:  Status post  right-sided thoracentesis.  EXAM: CHEST - 1 VIEW  COMPARISON:  Portable chest x-ray of earlier today  FINDINGS: The right lung is better aerated. The volume of pleural fluid has decreased. No postprocedure pneumothorax is evident. The left lung is well-expanded and  grossly clear.  IMPRESSION: There is no postprocedure complication following right-sided thoracentesis. There is improved aeration of the right lung.   Electronically Signed   By: David  Martinique   On: 06/30/2014 14:53   Dg Chest 2 View  07/04/2014   CLINICAL DATA:  Subsequent encounter or abnormal cardiac rhythm. Status post right thoracentesis. Multiple myeloma.  EXAM: CHEST  2 VIEW  COMPARISON:  One-view chest 07/02/2014.  FINDINGS: The heart is partially obscured by opacification of the right base. Atherosclerotic changes are again noted. A right IJ Port-A-Cath is stable in position. A right pleural effusion is increasing. Associated airspace disease is again noted. The left base is clear.  IMPRESSION: 1. Re-accumulation of right pleural effusion. 2. Underlying right lower lobe airspace disease. While this likely reflects atelectasis, is not excluded.   Electronically Signed   By: Lawrence Santiago M.D.   On: 07/04/2014 02:20   Dg Chest Port 1 View  07/04/2014   CLINICAL DATA:  Subsequent encounter for re- evaluation of right pleural effusion.  EXAM: PORTABLE CHEST - 1 VIEW  COMPARISON:  06/30/2014  FINDINGS: There is a right chest wall port a catheter with tip in the projection of the cavoatrial junction. The heart size appears normal. Right pleural effusion is unchanged in volume from previous exam. No change in aeration to the right lower lobe. Left lung appears clear.  IMPRESSION: 1. No significant change in volume of the moderate to large right pleural effusion.   Electronically Signed   By: Kerby Moors M.D.   On: 07/04/2014 08:13   Dg Chest Port 1 View  07/02/2014   CLINICAL DATA:  Shortness of breath.  EXAM: PORTABLE CHEST - 1 VIEW  COMPARISON:  06/30/2014.  Chest CT 04/19/2014.  FINDINGS: A power port catheter is in stable position. Mediastinal structures are normal. Right lower lobe atelectasis. Interval slight increase in right pleural effusion and/or pleural based mass. No pneumothorax. No  acute bony abnormality.  IMPRESSION: 1. There is persistent right lower lobe atelectasis. 2. Interim slight progression of right pleural effusion and/or pleural mass.   Electronically Signed   By: Marcello Moores  Register   On: 07/02/2014 07:37   Dg Chest Port 1 View  06/30/2014   CLINICAL DATA:  Reassess right pleural effusion.  EXAM: PORTABLE CHEST - 1 VIEW  COMPARISON:  Portable chest x-ray of June 28, 2014  FINDINGS: The volume of pleural fluid on the right has increased since yesterday's study. Only a small portion of the upper lobe remains aerated. There r is soft tissue fullness in the right hilar region. The left lung is well-expanded. The interstitial markings are more prominent today. There is no definite pleural effusion. The pulmonary vascularity is indistinct. The power port catheter tip overlies the midportion of the SVC. The bony thorax exhibits no acute abnormality.  IMPRESSION: Increased volume of the right pleural effusion since yesterday's study. Mild interstitial edema has developed on the left.   Electronically Signed   By: David  Martinique   On: 06/30/2014 11:30   Dg Chest Portable 1 View  06/28/2014   CLINICAL DATA:  Cough and shortness of breath for 1 week, history of atrial fibrillation and multiple myeloma  EXAM: PORTABLE CHEST - 1 VIEW  COMPARISON:  05/17/2014 ,04/19/2014  FINDINGS: Cardiac shadow is mildly prominent. An enlarging right-sided pleural effusion is seen. Right-sided chest port is again noted and stable. The left lung is clear. Old rib fractures are noted on the left. There is irregularity of the seventh rib on the right similar to that seen on prior CT.  IMPRESSION: Enlarging right-sided pleural effusion.  Chronic changes.   Electronically Signed   By: Inez Catalina M.D.   On: 06/28/2014 16:47   US Thoracentesis Asp Pleural Space W/img Guide  06/30/2014   CLINICAL DATA:  Multiple myeloma. Enlarging right pleural effusion. Respiratory difficulty.  TECHNIQUE: THORACENTESIS WITH  ULTRASOUND  Technique: The procedure, risks (including but not limited to bleeding, infection, organ damage ), benefits, and alternatives were explained to the patient. Questions regarding the procedure were encouraged and answered. The patient understands and consents to the procedure. Survey ultrasound of the right hemithorax was performed and an appropriate skin entry site was localized. Site was marked, prepped with Betadine, draped in usual sterile fashion, infiltrated locally with 1% lidocaine.  The 5-French Yueh sheath needle was advanced into the pleural space. Blood tinged fluid returned. 1.15 L was removed. A sample was sent for the requested laboratory studies. Post procedure imaging shows minimal residual fluid. The patient tolerated procedure well, with no immediate complication.  IMPRESSION: Technically successful ultrasound-guided right thoracentesis. Follow-up chest radiograph pending.   Electronically Signed   By: Arne Cleveland M.D.   On: 06/30/2014 14:27    Microbiology: Recent Results (from the past 240 hour(s))  CULTURE, BLOOD (SINGLE)     Status: None   Collection Time    06/28/14  3:21 PM      Result Value Ref Range Status   Blood Culture, Routine Culture, Blood   Final   Comment: Final - ===== FINAL REPORT =====NO GROWTH 5 DAYS  MRSA PCR SCREENING     Status: None   Collection Time    06/29/14 12:08 AM      Result Value Ref Range Status   MRSA by PCR NEGATIVE  NEGATIVE Final   Comment:            The GeneXpert MRSA Assay (FDA     approved for NASAL specimens     only), is one component of a     comprehensive MRSA colonization     surveillance program. It is not     intended to diagnose MRSA     infection nor to guide or     monitor treatment for     MRSA infections.  BODY FLUID CULTURE     Status: None   Collection Time    06/30/14  2:01 PM      Result Value Ref Range Status   Specimen Description PLEURAL RIGHT   Final   Special Requests NONE   Final   Gram  Stain     Final   Value: ABUNDANT WBC PRESENT, PREDOMINANTLY MONONUCLEAR     NO ORGANISMS SEEN     Performed at [facility]   Culture     Final   Value: NO GROWTH 3 DAYS     Performed at [facility]   Report Status 07/04/2014 FINAL   Final     Labs: Basic Metabolic Panel:  Recent Labs Lab 07/01/14 0410 07/02/14 0400 07/03/14 0500 07/04/14 0516 07/05/14 0450  NA 138 143 141 138 142  K 3.2* 4.0 3.9 4.0 3.7  CL 99 101 98 94* 94*  CO2  $'24 24 23 25 26  'y$ GLUCOSE 67* 71 69* 70 79  BUN $Re'10 10 11 11 13  'cCc$ CREATININE 0.62 0.63 0.67 0.68 0.75  CALCIUM 7.7* 8.2* 8.1* 9.3 9.7  MG 2.0  --   --   --   --    Liver Function Tests:  Recent Labs Lab 06/28/14 1352  AST 12  ALT 11  ALKPHOS 57  BILITOT 0.82  PROT 5.8*  ALBUMIN 3.0*   No results found for this basename: LIPASE, AMYLASE,  in the last 168 hours No results found for this basename: AMMONIA,  in the last 168 hours CBC:  Recent Labs Lab 06/28/14 1352  07/01/14 0410 07/02/14 0400 07/03/14 0500 07/04/14 0516 07/05/14 0450  WBC 9.2  < > 7.2 7.0 6.5 7.2 7.3  NEUTROABS 6.1  --   --   --   --   --   --   HGB 11.4*  < > 8.9* 8.6* 9.4* 10.1* 10.1*  HCT 36.0  < > 27.6* 26.5* 29.6* 30.9* 31.3*  MCV 98.2  < > 98.2 100.4* 100.0 99.0 99.4  PLT 193  < > 120* 113* 134* 195 188  < > = values in this interval not displayed. Cardiac Enzymes:  Recent Labs Lab 06/28/14 1521 06/28/14 2100 06/29/14 0230 06/29/14 0845  CKTOTAL 18  --   --   --   CKMB 2.4  --   --   --   TROPONINI 0.08* <0.30 <0.30 <0.30   BNP: BNP (last 3 results)  Recent Labs  06/28/14 1650  PROBNP 2526.0*   CBG:  Recent Labs Lab 07/01/14 1152  GLUCAP 109*    Time coordinating discharge: 45 minutes  Signed:  Mohmmad Saleeby  [facility] 07/05/2014, 9:52 AM

## 2014-07-05 NOTE — Progress Notes (Signed)
Physical Therapy Discharge Patient Details Name: MALEIGHA COLVARD MRN: [id number] DOB: 05-Jul-1936 Today's Date: 07/05/2014 Time:  -     Patient discharged from PT services secondary to per OT note, to DC to home with Hospice. No further needs at this time..  Please see latest therapy progress note for current level of functioning and progress toward goals.         Marcelino Freestone PT [phone]  07/05/2014, 9:51 AM

## 2014-07-05 NOTE — Telephone Encounter (Signed)
Please stop

## 2014-07-05 NOTE — Progress Notes (Signed)
OT Cancellation Note  Patient Details Name: [name] MRN: [id number] DOB: 1936-01-16   Cancelled Treatment:    Reason Eval/Treat Not Completed: Other (comment) Spoke to family who requests to not have any more therapy at this time. Spoke to Dr. Sheran Fava as therapy received a new order 07/04/14 and she reports that the plan has changed and agrees that therapy can sign off. Will sign off for OT.    Jules Schick [phone] 07/05/2014, 9:41 AM

## 2014-07-05 NOTE — Progress Notes (Signed)
Visit with patient and spouse per social work request. Patient leaving the hospital with hospice care today. Patient spent most of the visit frowning. Her spouse mostly talked. He told me they were in a long term care facility and their neighbor across the hall was in hospice. He seemed relieved at the decision. Answered a few questions he had but he seemed quite familiar with hospice from previous experience with others who'd had hospice care.  Epifania Gore, PhD, [place]

## 2014-07-05 NOTE — Telephone Encounter (Signed)
Husband asks if pt is to continue taking Dexamethasone once a week?  It was still on her med list on hospital d/c.  Instructed him she does not need to take that medication.  Will call him back if Dr. Alvy Bimler says she does need to take it.   He verbalized understanding.

## 2014-07-05 NOTE — Progress Notes (Signed)
CSW spoke with MD this am. Pt's d/c plans had changed from 10/7. Pt has made plans to return home with hospice care.  Werner Lean LCSW [phone]

## 2014-07-05 NOTE — Progress Notes (Signed)
Patient discharged home with husband and home hospice, discharge instructions given and explained to patient/husband and they verbalized understanding, patient denies any pain/distress. No wound noted, skin intact with some bruises. Accompanied home by husband in his personal car, transported to the car by staff via wheelchair.Marland Kitchen

## 2014-07-06 ENCOUNTER — Ambulatory Visit: Payer: Medicare Other | Admitting: Hematology and Oncology

## 2014-07-06 ENCOUNTER — Ambulatory Visit: Payer: Medicare Other

## 2014-07-06 ENCOUNTER — Other Ambulatory Visit: Payer: Medicare Other

## 2014-07-13 ENCOUNTER — Other Ambulatory Visit: Payer: Medicare Other

## 2014-07-13 ENCOUNTER — Ambulatory Visit: Payer: Medicare Other

## 2014-07-29 DEATH — deceased
